# Patient Record
Sex: Female | Born: 1960
Health system: Southern US, Community
[De-identification: ages and names within clinical notes are randomized; demographics above are authoritative.]

## PROBLEM LIST (undated history)

## (undated) DIAGNOSIS — E785 Hyperlipidemia, unspecified: Secondary | ICD-10-CM

## (undated) DIAGNOSIS — E039 Hypothyroidism, unspecified: Secondary | ICD-10-CM

## (undated) DIAGNOSIS — Z9889 Other specified postprocedural states: Secondary | ICD-10-CM

## (undated) DIAGNOSIS — I10 Essential (primary) hypertension: Secondary | ICD-10-CM

## (undated) DIAGNOSIS — E559 Vitamin D deficiency, unspecified: Secondary | ICD-10-CM

## (undated) DIAGNOSIS — I739 Peripheral vascular disease, unspecified: Secondary | ICD-10-CM

## (undated) DIAGNOSIS — K8020 Calculus of gallbladder without cholecystitis without obstruction: Secondary | ICD-10-CM

## (undated) DIAGNOSIS — N183 Chronic kidney disease, stage 3 unspecified: Secondary | ICD-10-CM

## (undated) DIAGNOSIS — N281 Cyst of kidney, acquired: Secondary | ICD-10-CM

## (undated) DIAGNOSIS — Z720 Tobacco use: Secondary | ICD-10-CM

## (undated) DIAGNOSIS — Z85828 Personal history of other malignant neoplasm of skin: Secondary | ICD-10-CM

## (undated) DIAGNOSIS — I2510 Atherosclerotic heart disease of native coronary artery without angina pectoris: Secondary | ICD-10-CM

## (undated) DIAGNOSIS — I729 Aneurysm of unspecified site: Secondary | ICD-10-CM

## (undated) HISTORY — DX: Vitamin D deficiency, unspecified: E55.9

## (undated) HISTORY — DX: Other specified postprocedural states: Z98.890

## (undated) HISTORY — DX: Hypothyroidism, unspecified: E03.9

## (undated) HISTORY — DX: Peripheral vascular disease, unspecified: I73.9

## (undated) HISTORY — DX: Calculus of gallbladder without cholecystitis without obstruction: K80.20

## (undated) HISTORY — DX: Hyperlipidemia, unspecified: E78.5

## (undated) HISTORY — DX: Chronic kidney disease, stage 3 (moderate): N18.3

## (undated) HISTORY — DX: Chronic kidney disease, stage 3 unspecified: N18.30

## (undated) HISTORY — DX: Tobacco use: Z72.0

## (undated) HISTORY — DX: Cyst of kidney, acquired: N28.1

## (undated) HISTORY — DX: Essential (primary) hypertension: I10

## (undated) HISTORY — PX: CORONARY ARTERY BYPASS GRAFT: SHX141

## (undated) HISTORY — DX: Aneurysm of unspecified site: I72.9

## (undated) HISTORY — DX: Personal history of other malignant neoplasm of skin: Z85.828

## (undated) HISTORY — DX: Atherosclerotic heart disease of native coronary artery without angina pectoris: I25.10

---

## 1998-06-01 ENCOUNTER — Ambulatory Visit (HOSPITAL_COMMUNITY): Admission: RE | Admit: 1998-06-01 | Discharge: 1998-06-01 | Payer: Self-pay | Admitting: Cardiovascular Disease

## 1998-06-01 ENCOUNTER — Encounter: Payer: Self-pay | Admitting: Cardiovascular Disease

## 1998-06-03 ENCOUNTER — Observation Stay (HOSPITAL_COMMUNITY): Admission: AD | Admit: 1998-06-03 | Discharge: 1998-06-03 | Payer: Self-pay | Admitting: Cardiovascular Disease

## 1998-06-07 ENCOUNTER — Encounter: Payer: Self-pay | Admitting: Surgery

## 1998-06-08 ENCOUNTER — Encounter: Payer: Self-pay | Admitting: Cardiovascular Disease

## 1998-06-08 ENCOUNTER — Encounter: Payer: Self-pay | Admitting: Surgery

## 1998-06-08 ENCOUNTER — Inpatient Hospital Stay (HOSPITAL_COMMUNITY): Admission: RE | Admit: 1998-06-08 | Discharge: 1998-06-15 | Payer: Self-pay | Admitting: Surgery

## 1998-06-09 ENCOUNTER — Encounter: Payer: Self-pay | Admitting: Surgery

## 1998-06-10 ENCOUNTER — Encounter: Payer: Self-pay | Admitting: Surgery

## 1998-06-11 ENCOUNTER — Encounter: Payer: Self-pay | Admitting: Cardiology

## 1998-08-02 ENCOUNTER — Encounter (HOSPITAL_COMMUNITY): Admission: RE | Admit: 1998-08-02 | Discharge: 1998-10-31 | Payer: Self-pay | Admitting: Cardiovascular Disease

## 1998-11-01 ENCOUNTER — Encounter (HOSPITAL_COMMUNITY): Admission: RE | Admit: 1998-11-01 | Discharge: 1999-01-30 | Payer: Self-pay | Admitting: Cardiovascular Disease

## 2000-02-27 DIAGNOSIS — I2510 Atherosclerotic heart disease of native coronary artery without angina pectoris: Secondary | ICD-10-CM

## 2000-02-27 HISTORY — DX: Atherosclerotic heart disease of native coronary artery without angina pectoris: I25.10

## 2000-02-27 HISTORY — PX: CORONARY ARTERY BYPASS GRAFT: SHX141

## 2000-07-28 ENCOUNTER — Ambulatory Visit (HOSPITAL_COMMUNITY): Admission: RE | Admit: 2000-07-28 | Discharge: 2000-07-28 | Payer: Self-pay | Admitting: Oncology

## 2000-07-29 ENCOUNTER — Encounter: Payer: Self-pay | Admitting: Vascular Surgery

## 2000-07-31 ENCOUNTER — Ambulatory Visit (HOSPITAL_COMMUNITY): Admission: RE | Admit: 2000-07-31 | Discharge: 2000-07-31 | Payer: Self-pay | Admitting: Vascular Surgery

## 2001-07-08 ENCOUNTER — Encounter: Payer: Self-pay | Admitting: Internal Medicine

## 2001-07-08 ENCOUNTER — Encounter: Admission: RE | Admit: 2001-07-08 | Discharge: 2001-07-08 | Payer: Self-pay | Admitting: Internal Medicine

## 2003-02-27 HISTORY — PX: EMBOLIZATION: SHX1496

## 2003-02-27 HISTORY — PX: CEREBRAL ANEURYSM REPAIR: SHX164

## 2003-12-13 ENCOUNTER — Emergency Department (HOSPITAL_COMMUNITY): Admission: EM | Admit: 2003-12-13 | Discharge: 2003-12-13 | Payer: Self-pay | Admitting: Emergency Medicine

## 2004-11-07 ENCOUNTER — Emergency Department (HOSPITAL_COMMUNITY): Admission: EM | Admit: 2004-11-07 | Discharge: 2004-11-07 | Payer: Self-pay | Admitting: Emergency Medicine

## 2005-03-23 ENCOUNTER — Encounter: Admission: RE | Admit: 2005-03-23 | Discharge: 2005-03-23 | Payer: Self-pay | Admitting: Neurosurgery

## 2007-05-23 ENCOUNTER — Ambulatory Visit (HOSPITAL_COMMUNITY): Admission: RE | Admit: 2007-05-23 | Discharge: 2007-05-23 | Payer: Self-pay | Admitting: Internal Medicine

## 2007-06-04 ENCOUNTER — Encounter: Admission: RE | Admit: 2007-06-04 | Discharge: 2007-06-04 | Payer: Self-pay | Admitting: Internal Medicine

## 2007-07-11 ENCOUNTER — Encounter: Payer: Self-pay | Admitting: Neurology

## 2008-01-09 ENCOUNTER — Encounter: Admission: RE | Admit: 2008-01-09 | Discharge: 2008-01-09 | Payer: Self-pay | Admitting: Internal Medicine

## 2009-05-16 ENCOUNTER — Encounter: Payer: Self-pay | Admitting: Cardiovascular Disease

## 2009-06-29 ENCOUNTER — Encounter: Payer: Self-pay | Admitting: Cardiovascular Disease

## 2009-07-11 ENCOUNTER — Ambulatory Visit (HOSPITAL_COMMUNITY): Admission: RE | Admit: 2009-07-11 | Discharge: 2009-07-11 | Payer: Self-pay | Admitting: Internal Medicine

## 2009-07-11 ENCOUNTER — Encounter: Payer: Self-pay | Admitting: Cardiovascular Disease

## 2009-07-13 ENCOUNTER — Encounter: Payer: Self-pay | Admitting: Cardiovascular Disease

## 2009-07-21 ENCOUNTER — Encounter: Payer: Self-pay | Admitting: Cardiovascular Disease

## 2009-07-22 ENCOUNTER — Encounter: Payer: Self-pay | Admitting: Cardiovascular Disease

## 2009-07-27 ENCOUNTER — Encounter: Admission: RE | Admit: 2009-07-27 | Discharge: 2009-07-27 | Payer: Self-pay | Admitting: Neurosurgery

## 2009-08-16 DIAGNOSIS — N184 Chronic kidney disease, stage 4 (severe): Secondary | ICD-10-CM | POA: Insufficient documentation

## 2009-08-16 DIAGNOSIS — E039 Hypothyroidism, unspecified: Secondary | ICD-10-CM | POA: Insufficient documentation

## 2009-08-16 DIAGNOSIS — I10 Essential (primary) hypertension: Secondary | ICD-10-CM | POA: Insufficient documentation

## 2009-08-16 DIAGNOSIS — E782 Mixed hyperlipidemia: Secondary | ICD-10-CM | POA: Insufficient documentation

## 2009-08-16 DIAGNOSIS — I2510 Atherosclerotic heart disease of native coronary artery without angina pectoris: Secondary | ICD-10-CM | POA: Insufficient documentation

## 2009-08-16 DIAGNOSIS — I739 Peripheral vascular disease, unspecified: Secondary | ICD-10-CM | POA: Insufficient documentation

## 2009-08-16 DIAGNOSIS — Z8673 Personal history of transient ischemic attack (TIA), and cerebral infarction without residual deficits: Secondary | ICD-10-CM | POA: Insufficient documentation

## 2009-08-16 DIAGNOSIS — G40909 Epilepsy, unspecified, not intractable, without status epilepticus: Secondary | ICD-10-CM | POA: Insufficient documentation

## 2009-08-17 ENCOUNTER — Telehealth (INDEPENDENT_AMBULATORY_CARE_PROVIDER_SITE_OTHER): Payer: Self-pay

## 2009-08-17 ENCOUNTER — Ambulatory Visit: Payer: Self-pay | Admitting: Cardiovascular Disease

## 2009-08-17 DIAGNOSIS — F17200 Nicotine dependence, unspecified, uncomplicated: Secondary | ICD-10-CM | POA: Insufficient documentation

## 2009-08-18 ENCOUNTER — Encounter: Payer: Self-pay | Admitting: Cardiology

## 2009-08-18 ENCOUNTER — Ambulatory Visit: Payer: Self-pay

## 2009-08-18 ENCOUNTER — Ambulatory Visit: Payer: Self-pay | Admitting: Cardiology

## 2009-08-18 ENCOUNTER — Encounter (HOSPITAL_COMMUNITY): Admission: RE | Admit: 2009-08-18 | Discharge: 2009-11-02 | Payer: Self-pay | Admitting: Cardiovascular Disease

## 2009-08-22 ENCOUNTER — Encounter: Payer: Self-pay | Admitting: Cardiology

## 2009-08-22 ENCOUNTER — Telehealth: Payer: Self-pay | Admitting: Cardiovascular Disease

## 2009-08-23 ENCOUNTER — Encounter: Payer: Self-pay | Admitting: Cardiovascular Disease

## 2009-08-30 ENCOUNTER — Encounter: Payer: Self-pay | Admitting: Cardiovascular Disease

## 2009-08-31 ENCOUNTER — Ambulatory Visit: Payer: Self-pay | Admitting: Cardiovascular Disease

## 2009-08-31 LAB — CONVERTED CEMR LAB
BUN: 23 mg/dL (ref 6–23)
Basophils Absolute: 0 10*3/uL (ref 0.0–0.1)
Basophils Relative: 0.6 % (ref 0.0–3.0)
CO2: 31 meq/L (ref 19–32)
Calcium: 9.3 mg/dL (ref 8.4–10.5)
Chloride: 102 meq/L (ref 96–112)
Creatinine, Ser: 1.2 mg/dL (ref 0.4–1.2)
Eosinophils Absolute: 0.4 10*3/uL (ref 0.0–0.7)
Eosinophils Relative: 5.9 % — ABNORMAL HIGH (ref 0.0–5.0)
GFR calc non Af Amer: 48.43 mL/min (ref >60)
Glucose, Bld: 89 mg/dL (ref 70–99)
HCT: 39.2 % (ref 36.0–46.0)
Hemoglobin: 13.3 g/dL (ref 12.0–15.0)
INR: 0.9 (ref 0.8–1.0)
Lymphocytes Relative: 27.4 % (ref 12.0–46.0)
Lymphs Abs: 2 10*3/uL (ref 0.7–4.0)
MCHC: 33.9 g/dL (ref 30.0–36.0)
MCV: 95 fL (ref 78.0–100.0)
Monocytes Absolute: 0.5 10*3/uL (ref 0.1–1.0)
Monocytes Relative: 7 % (ref 3.0–12.0)
Neutro Abs: 4.4 10*3/uL (ref 1.4–7.7)
Neutrophils Relative %: 59.1 % (ref 43.0–77.0)
Platelets: 161 10*3/uL (ref 150.0–400.0)
Potassium: 3.5 meq/L (ref 3.5–5.1)
Prothrombin Time: 9.9 s (ref 9.7–11.8)
RBC: 4.12 M/uL (ref 3.87–5.11)
RDW: 13.9 % (ref 11.5–14.6)
Sodium: 142 meq/L (ref 135–145)
WBC: 7.5 10*3/uL (ref 4.5–10.5)

## 2009-09-01 ENCOUNTER — Ambulatory Visit: Payer: Self-pay | Admitting: Cardiovascular Disease

## 2009-09-01 ENCOUNTER — Inpatient Hospital Stay (HOSPITAL_BASED_OUTPATIENT_CLINIC_OR_DEPARTMENT_OTHER): Admission: RE | Admit: 2009-09-01 | Discharge: 2009-09-01 | Payer: Self-pay | Admitting: Cardiovascular Disease

## 2009-09-02 ENCOUNTER — Telehealth: Payer: Self-pay | Admitting: Cardiovascular Disease

## 2009-09-05 ENCOUNTER — Encounter: Payer: Self-pay | Admitting: Cardiovascular Disease

## 2009-09-05 ENCOUNTER — Ambulatory Visit: Payer: Self-pay | Admitting: Surgery

## 2009-09-20 ENCOUNTER — Ambulatory Visit: Payer: Self-pay | Admitting: Surgery

## 2009-09-20 ENCOUNTER — Encounter: Payer: Self-pay | Admitting: Cardiovascular Disease

## 2009-09-20 ENCOUNTER — Ambulatory Visit (HOSPITAL_COMMUNITY): Admission: RE | Admit: 2009-09-20 | Discharge: 2009-09-20 | Payer: Self-pay | Admitting: Surgery

## 2010-02-26 HISTORY — PX: CEREBRAL ANEURYSM REPAIR: SHX164

## 2010-03-19 ENCOUNTER — Encounter: Payer: Self-pay | Admitting: Neurosurgery

## 2010-03-20 ENCOUNTER — Encounter: Payer: Self-pay | Admitting: Interventional Radiology

## 2010-03-30 NOTE — Progress Notes (Signed)
Summary: [facility]   Imported By: Ranell Patrick 08/16/2009 10:07:33  _____________________________________________________________________  External Attachment:    Type:   Image     Comment:   External Document

## 2010-03-30 NOTE — Assessment & Plan Note (Signed)
Summary: surgical clearance/neuro surgery/jml   Primary Provider:  Dr. Wayland Denis  CC:  sugerical clearence back surgery.  History of Present Illness: Olivia Walsh is seen today at the request of Dr Annette Stable for surgical clearance.  She is scheduled for lumbar surgery next Thursday.  She had CABG by Dr Cyndia Bent in 2000.  She does not remember her cardiologist and has not been followed since then.  Her CABG report is not in [place].  She continues to smoke.  She is developing COPD clinically and I counseled her for less than 10 minutes.  Her activity is limited by back pain.  She denies SSCP but has exertional dyspnea.  She is compliant with her meds and will need to stop her Plavix for the surgery.  She has had previous cerebral artery coiling in 2005 and the Plavix is more for this than CAD.  She has no history of carotid disease, AAA or PVD.  She has not had previous complicatoins with surgery and has no bleeding diathesis.  I asked her to call Dr Gearldine Bienenstock office to see when they wanted her to hold her Plavix  Current Problems (verified): 1)  Renal Insufficiency  (ICD-588.9) 2)  Pvd  (ICD-443.9) 3)  Cad  (ICD-414.00) 4)  Cva  (ICD-434.91) 5)  Hypertension  (ICD-401.9) 6)  Hyperlipidemia  (ICD-272.4) 7)  Seizures, Hx of  (ICD-V12.49) 8)  Hypothyroidism  (ICD-244.9)  Current Medications (verified): 1)  Plavix 75 Mg Tabs (Clopidogrel Bisulfate) .... Take One Tablet By Mouth Daily 2)  Furosemide 40 Mg Tabs (Furosemide) .... Take One Tablet By Mouth Daily. 3)  Labetalol Hcl 300 Mg Tabs (Labetalol Hcl) .... 1/2 Tab By Mouth Two Times A Day 4)  Levothroid 75 Mcg Tabs (Levothyroxine Sodium) .Marland Kitchen.. 1 Tab By Mouth Once Daily 5)  Levetiracetam 500 Mg Tabs (Levetiracetam) .Marland Kitchen.. 1 Tab By Mouth Two Times A Day 6)  Crestor 20 Mg Tabs (Rosuvastatin Calcium) .... Take One Tablet By Mouth Daily. 7)  Vitamin B-12 (Cyanocobalamin) .... Daily 8)  Vitamin D (Ergocalciferol) 50000 Unit Caps (Ergocalciferol) .Marland Kitchen.. 1 Tab As  Directed 9)  Potassium Gluconate 595 Mg Tabs (Potassium Gluconate) .Marland Kitchen.. 1 Tab By Mouth Once Daily 10)  Magnesium 250 Mg Tabs (Magnesium) .Marland Kitchen.. 1 Tab By Mouth Once Daily 11)  Hydrocodone-Acetaminophen 2.5-500 Mg Tabs (Hydrocodone-Acetaminophen) .... As Needed 12)  Flexeril 10 Mg Tabs (Cyclobenzaprine Hcl) .... As Needed  Allergies (verified): 1)  ! * Pain Medication  Past History:  Past Medical History: Last updated: 08/16/2009 She had a right  CVA in 2000 by MRI. PVD  with  claudication symptoms CAD: CABG 2000 HYPERTENSION HYPERLIPIDEMIA  renal insufficiency SEIZURES, HX OF  HYPOTHYROIDISM  Right leg claudication. The patient has no known drug allergies, although she is  intolerant to pain medication and requires very low doses by her report.      Past Surgical History: Last updated: 08/16/2009  status post coronary artery bypass graft  surgery in 2000  She does not remember who her surgeon was and does not  currently have a cardiologist.  She reports being very sensitive to anesthesia.      Family History: Last updated: 08/16/2009   The patient has a very strong history of vascular   disease.  Her mother died at age 60 from heart trouble.  Her father died   from cancer of the brain.  There is no known family history of cerebral   aneurysms.   Social History: Last updated: 08/16/2009  The patient is married.  She and her husband living in   [place].  They do not have any children.  She smokes 1-1/2 to 1 pack   of cigarettes per day and has done so since age 24.  She does not use   alcohol.  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   Review of Systems       Denies fever, malais, weight loss, blurry vision, decreased visual acuity, cough, sputum, SOB, hemoptysis, pleuritic pain, palpitaitons, heartburn, abdominal pain, melena, lower extremity edema, claudication, or rash.   Vital Signs:  Patient profile:   50 year old female Height:      67  inches Weight:      188 pounds BMI:     29.55 Pulse rate:   67 / minute Resp:     12 per minute BP sitting:   116 / 75  (right arm)  Vitals Entered By: Burnett Kanaris (August 17, 2009 10:16 AM)  Physical Exam  General:  Affect appropriate Healthy:  appears stated age 64: normal Neck supple with no adenopathy JVP normal no bruits no thyromegaly Lungs clear with no wheezing and good diaphragmatic motion Heart:  S1/S2 no murmur,rub, gallop or click PMI normal Abdomen: benighn, BS positve, no tenderness, no AAA no bruit.  No HSM or HJR Distal pulses intact with no bruits No edema Neuro non-focal Skin warm and dry    Impression & Recommendations:  Problem # 1:  CAD (ICD-414.34) CABG 50 year old grafts with ongoing smoking.  Lexiscan myovue to clear for surgery Her updated medication list for this problem includes:    Plavix 75 Mg Tabs (Clopidogrel bisulfate) .Marland Kitchen... Take one tablet by mouth daily    Labetalol Hcl 300 Mg Tabs (Labetalol hcl) .Marland Kitchen... 1/2 tab by mouth two times a day  Orders: EKG w/ Interpretation (93000) Nuclear Stress Test (Nuc Stress Test)  Problem # 2:  CVA (ICD-434.91) Hold Plavix at least 5 days before lumbar surgery Her updated medication list for this problem includes:    Plavix 75 Mg Tabs (Clopidogrel bisulfate) .Marland Kitchen... Take one tablet by mouth daily  Problem # 3:  HYPERTENSION (ICD-401.9) Well cotnrolled Her updated medication list for this problem includes:    Furosemide 40 Mg Tabs (Furosemide) .Marland Kitchen... Take one tablet by mouth daily.    Labetalol Hcl 300 Mg Tabs (Labetalol hcl) .Marland Kitchen... 1/2 tab by mouth two times a day  Problem # 4:  HYPERLIPIDEMIA (ICD-272.4) Continue statin labs per primary Her updated medication list for this problem includes:    Crestor 20 Mg Tabs (Rosuvastatin calcium) .Marland Kitchen... Take one tablet by mouth daily.  Problem # 5:  SMOKER (ICD-305.1) Counseled for less than 10 minutes.  Encouraged her to get PFT's with primary.   Indicated clinical COPD and risk to worsening vascular disease with ongoing abuse.  F/ U primary consdier Chantix post lumbar surgery  Patient Instructions: 1)  Your physician recommends that you schedule a follow-up appointment in: [place] 2)  Your physician recommends that you continue on your current medications as directed. Please refer to the Current Medication list given to you today. 3)  Your physician has requested that you have an [place].  For further information please visit [email or web address].  Please follow instruction sheet, as given.   EKG Report  Procedure date:  08/17/2009  Findings:      NSR 67 Minor lateral T wave changes No recent ECG to compare

## 2010-03-30 NOTE — Op Note (Signed)
Summary: [facility]  [place]   Imported By: Marilynne Drivers 10/06/2009 12:49:54  _____________________________________________________________________  External Attachment:    Type:   Image     Comment:   External Document

## 2010-03-30 NOTE — Miscellaneous (Signed)
Summary: Orders Update  Clinical Lists Changes  Orders: Added new Test order of TLB-BMP (Basic Metabolic Panel-BMET) (80048-METABOL) - Signed Added new Test order of TLB-CBC Platelet - w/Differential (85025-CBCD) - Signed Added new Test order of TLB-PT (Protime) (85610-PTP) - Signed 

## 2010-03-30 NOTE — Letter (Signed)
Summary: [facility] Visit Note  [facility] Office Visit Note   Imported By: Sallee Provencal 08/22/2009 08:46:06  _____________________________________________________________________  External Attachment:    Type:   Image     Comment:   External Document

## 2010-03-30 NOTE — Letter (Signed)
Summary: [facility] Office Visit Note  [facility] Office Visit Note   Imported By: Sallee Provencal 08/22/2009 08:47:16  _____________________________________________________________________  External Attachment:    Type:   Image     Comment:   External Document

## 2010-03-30 NOTE — Letter (Signed)
Summary: [facility] Office Visit Note  [facility] Office Visit Note   Imported By: Sallee Provencal 08/22/2009 08:46:29  _____________________________________________________________________  External Attachment:    Type:   Image     Comment:   External Document

## 2010-03-30 NOTE — Progress Notes (Signed)
Summary: [facility]  [facility]   Imported By: Ranell Patrick 08/16/2009 10:03:02  _____________________________________________________________________  External Attachment:    Type:   Image     Comment:   External Document

## 2010-03-30 NOTE — Progress Notes (Signed)
Summary: status of meds that suppose to be called in / cath on yesterday  Phone Note Call from Patient Call back at Home Phone [phone]   Caller: Patient Reason for Call: Talk to Nurse Summary of Call: per pt calling had cath done yesterday was told by pn that meds would be called in regarding blood pressure. [employer] claims handler on [address]  Initial call taken by: Neil Crouch,  September 02, 2009 10:03 AM  Follow-up for Phone Call        per dr Johnsie Cancel, pt to start lisinopril 5mg  once daily for blood pressure, script sent to pharm, pt aware Fredia Beets, RN  September 02, 2009 1:57 PM     New/Updated Medications: LISINOPRIL 5 MG TABS (LISINOPRIL) Take one tablet by mouth daily Prescriptions: LISINOPRIL 5 MG TABS (LISINOPRIL) Take one tablet by mouth daily  #30 x 12   Entered by:   Fredia Beets, RN   Authorized by:   Bosie Clos, MD, [facility]   Signed by:   Fredia Beets, RN on 09/02/2009   Method used:   Electronically to        [facility] (retail)       [address]       [place], [place]  [address]       Ph: [phone] or [phone]       Fax: [phone]   RxID:   [id number]

## 2010-03-30 NOTE — Progress Notes (Signed)
Summary: [facility]  [facility]   Imported By: Ranell Patrick 08/16/2009 10:05:58  _____________________________________________________________________  External Attachment:    Type:   Image     Comment:   External Document

## 2010-03-30 NOTE — Op Note (Signed)
[facility]   Imported By: Ranell Patrick 08/25/2009 09:31:41  _____________________________________________________________________  External Attachment:    Type:   Image     Comment:   External Document

## 2010-03-30 NOTE — Assessment & Plan Note (Signed)
Summary: Cardiology Nuclear Study  Nuclear Med Background Indications for Stress Test: Evaluation for Ischemia, Surgical Clearance, Graft Patency  Indications Comments: Pending Back Surgery by Dr. Earnie Larsson on 08/25/09.  History: CABG, Heart Catheterization, Myocardial Perfusion Study  History Comments: '00 MPI: no report in E-chart. '00 CABG x5. '05 Brain aneurysm with coiling.  Hx. (L) carotid artery aneurysm.  Symptoms: DOE    Nuclear Pre-Procedure Cardiac Risk Factors: Claudication, CVA, Family History - CAD, Hypertension, Lipids, PVD, Smoker Caffeine/Decaff Intake: None NPO After: 9:00 PM Lungs: Clear IV 0.9% NS with Angio Cath: 22g     IV Site: (L) Upper Forearm IV Started by: Irven Baltimore RN Chest Size (in) 36     Cup Size B     Height (in): 67 Weight (lb): 184 BMI: 28.92 Tech Comments: Held labetalol and lasix this am.  Nuclear Med Study 1 or 2 day study:  1 day     Stress Test Type:  Lexiscan [place] MD:  Loralie Champagne, MD     Referring MD:  Jenkins Rouge Resting Radionuclide:  Technetium 51m Tetrofosmin     Resting Radionuclide Dose:  10.6 mCi  Stress Radionuclide:  Technetium 28m Tetrofosmin     Stress Radionuclide Dose:  32 mCi   Stress Protocol      Max HR:  80 bpm Max Systolic BP: 0000000 mm HgRate Pressure Product:  12800  Lexiscan: 0.4 mg   Stress Test Technologist:  Irven Baltimore RN     Nuclear Technologist:  Charlton Amor CNMT  Rest Procedure  Myocardial perfusion imaging was performed at rest 45 minutes following the intravenous administration of Myoview Technetium 33m Tetrofosmin.  Stress Procedure  The patient received IV Lexiscan 0.4 mg over 15-seconds.  Myoview injected at 30-seconds.  There were baseline nonspecific ST-T changes, but no significant changes with Lexiscan.  Quantitative spect images were obtained after a 45 minute delay.  QPS Raw Data Images:  Mild breast attenuation.  Normal left ventricular size. Stress Images:  Mild anterior  perfusion defect.  Moderate basal inferolateral perfusion defect.  Rest Images:  Mild anterior perfusion defect.  Subtraction (SDS):  Mild fixed anterior perfusion defect.  Moderate reversible basal inferolateral perfusion defect.  Transient Ischemic Dilatation:  1.15  (Normal <1.22)  Lung/Heart Ratio:  .29  (Normal <0.45)  Quantitative Gated Spect Images QGS EDV:  92 ml QGS ESV:  40 ml QGS EF:  57 % QGS cine images:  Basal inferolateral hypokinesis.    Overall Impression  Exercise Capacity: Lexiscan study BP Response: Normal blood pressure response. Clinical Symptoms: Flushed ECG Impression: No significant ST segment change suggestive of ischemia. Overall Impression: Mild fixed anterior defect that I suspect is breast attenuation.  There is a reversible basal inferolateral perfusion defect accompanied by corresponding hypokinesis.  This is suggestive of ischemia  Appended Document: Cardiology Nuclear Study set up for cath Tues/Wendsday before back surgery.  Old CABG and still smoking with ischemia on myovue  Appended Document: Cardiology Nuclear Study spoke with pt, she is scheduled for a cath in the [facility] lab on thursday 09-01-09. she will have labs done in our office 08-31-09. instructions over the phone. Fredia Beets, RN  August 30, 2009 11:52 AM    Clinical Lists Changes  Orders: Added new Referral order of Cardiac Catheterization (Cardiac Cath) - Signed

## 2010-03-30 NOTE — Progress Notes (Signed)
Summary: Nuc. Pre-Procedure  Phone Note Outgoing Call   Call placed by: Irven Baltimore, RN,  August 17, 2009 11:03 AM Summary of Call: Reviewed information on Myoview Information Sheet (see scanned document for further details).  Spoke with the patient after office visit with Dr. Edmonia James today. Cela Newcom,RN.     Nuclear Med Background Indications for Stress Test: Evaluation for Ischemia, Surgical Clearance, Graft Patency  Indications Comments: Pending Back Surgery by Dr. Earnie Larsson.  History: CABG  History Comments: 2005 Hx. Brain aneurysm with coiling.  Hx. (L) carotid artery aneurysm.  Symptoms: DOE    Nuclear Pre-Procedure Cardiac Risk Factors: Claudication, CVA, Family History - CAD, Hypertension, Lipids, PVD, Smoker Height (in): 50

## 2010-03-30 NOTE — Letter (Signed)
Summary: Generic Letter  Press photographer, [place]  [address] [place]   [place], [place] [address]   Phone: [phone]  Fax: [phone]    08/23/2009  Esbeidy Eyerman [id number] [place], [place]  [address]  DR POOL, ABOVE NAMED PT IS AT LOW RISK FOR CARDIAC EVENT PER DR BENSIMHON.MAY PROCEED WITH SURGERY.         Sincerely,  DR DAN BENSIMHON/ Devra Dopp, LPN

## 2010-03-30 NOTE — Progress Notes (Signed)
Summary: [facility]  [facility]   Imported By: Ranell Patrick 08/16/2009 10:03:56  _____________________________________________________________________  External Attachment:    Type:   Image     Comment:   External Document

## 2010-03-30 NOTE — Letter (Signed)
Summary: Operative Report   Operative Report   Imported By: Sallee Provencal 08/25/2009 15:50:13  _____________________________________________________________________  External Attachment:    Type:   Image     Comment:   External Document

## 2010-03-30 NOTE — Letter (Signed)
Summary: [facility] Office Note  [facility] Office Note   Imported By: Sallee Provencal 08/22/2009 08:47:46  _____________________________________________________________________  External Attachment:    Type:   Image     Comment:   External Document

## 2010-03-30 NOTE — Progress Notes (Signed)
Summary: pt needs surgical clearence  Phone Note Call from Patient Call back at Work Phone [phone]   Caller: Patient Reason for Call: Talk to Nurse, Talk to Doctor, Lab or Test Results Summary of Call: pt had myoview and needs results for surgical clearance needs a call back today Initial call taken by: Shelda Pal,  August 22, 2009 11:28 AM  Follow-up for Phone Call        pt calling back re earlier msg-pls call [phone]  Follow-up by: Lorenda Hatchet,  August 22, 2009 2:11 PM  Additional Follow-up for Phone Call Additional follow up Details #1::        PT CALLED  NEEDING RESULTS OF STRESS TEST NEEDS TO SCHEDULE SURGERY NOT SCHEDULED AT THIS TIME PENDING STRESS RESULTS PT AWARE DR Whiteface UPSET  IN PAIN NEEDS TO SET UP SX.SPOKE WITH DOD(DR BENSIMOHN) STATED  PT AT [facility] [facility] TO [place]. OF ABOVE . Additional Follow-up by: Devra Dopp, LPN,  June 28, [id number] [id number] PM    Additional Follow-up for Phone Call Additional follow up Details #2::    returning call, Darnell Level  August 23, 2009 1:24 PM  PT AWARE. LETTER FAXED TO DR Mallie Mussel POOL. Follow-up by: Devra Dopp, LPN,  June 28, 624THL [id number] PM   Appended Document: pt needs surgical clearence Altha Harm,  I am in [facility] Tuesday and Wendsday.  Please review clearance with Linna Hoff as Evonnie Dawes was "positive"  Appended Document: pt needs surgical clearence recieved phone call from dr Johnsie Cancel, pt myoview positive and pt needs cath prior to surgery. spoke with vanessa in dr pool's office. pt is scheduled for surgery 09-08-09. pt will have cath this week and then clearence will be decided. pt aware.

## 2010-03-30 NOTE — Progress Notes (Signed)
Summary: [facility]  [facility]   Imported By: Ranell Patrick 08/16/2009 10:05:09  _____________________________________________________________________  External Attachment:    Type:   Image     Comment:   External Document

## 2010-03-30 NOTE — Cardiovascular Report (Signed)
Summary: Pre Cath Orders   Pre Cath Orders   Imported By: Sallee Provencal 09/06/2009 15:39:11  _____________________________________________________________________  External Attachment:    Type:   Image     Comment:   External Document

## 2010-03-30 NOTE — Letter (Signed)
Summary: [facility]cent Office Visit Note  [facility] Office Visit Note   Imported By: Sallee Provencal 08/22/2009 08:46:54  _____________________________________________________________________  External Attachment:    Type:   Image     Comment:   External Document

## 2010-03-30 NOTE — Letter (Signed)
Summary: [facility]   [facility]   Imported By: Sallee Provencal 10/03/2009 12:29:35  _____________________________________________________________________  External Attachment:    Type:   Image     Comment:   External Document

## 2010-05-13 LAB — POCT I-STAT 4, (NA,K, GLUC, HGB,HCT)
Glucose, Bld: 89 mg/dL (ref 70–99)
HCT: 37 % (ref 36.0–46.0)
Hemoglobin: 12.6 g/dL (ref 12.0–15.0)
Potassium: 3.4 mEq/L — ABNORMAL LOW (ref 3.5–5.1)
Sodium: 139 mEq/L (ref 135–145)

## 2010-05-13 LAB — POCT I-STAT, CHEM 8
BUN: 22 mg/dL (ref 6–23)
Calcium, Ion: 1.12 mmol/L (ref 1.12–1.32)
Chloride: 102 mEq/L (ref 96–112)
Creatinine, Ser: 1.4 mg/dL — ABNORMAL HIGH (ref 0.4–1.2)
Glucose, Bld: 88 mg/dL (ref 70–99)
HCT: 40 % (ref 36.0–46.0)
Hemoglobin: 13.6 g/dL (ref 12.0–15.0)
Potassium: 3.5 mEq/L (ref 3.5–5.1)
Sodium: 140 mEq/L (ref 135–145)
TCO2: 33 mmol/L (ref 0–100)

## 2010-06-12 ENCOUNTER — Emergency Department (HOSPITAL_COMMUNITY): Payer: BC Managed Care – HMO

## 2010-06-12 ENCOUNTER — Emergency Department (HOSPITAL_COMMUNITY)
Admission: EM | Admit: 2010-06-12 | Discharge: 2010-06-12 | Disposition: A | Discharge status: Nursing Home (Includes ICF) | Payer: BC Managed Care – HMO | Attending: Emergency Medicine | Admitting: Emergency Medicine

## 2010-06-12 DIAGNOSIS — I629 Nontraumatic intracranial hemorrhage, unspecified: Secondary | ICD-10-CM | POA: Insufficient documentation

## 2010-06-12 DIAGNOSIS — G9389 Other specified disorders of brain: Secondary | ICD-10-CM | POA: Insufficient documentation

## 2010-06-12 DIAGNOSIS — R51 Headache: Secondary | ICD-10-CM | POA: Insufficient documentation

## 2010-06-12 DIAGNOSIS — I10 Essential (primary) hypertension: Secondary | ICD-10-CM | POA: Insufficient documentation

## 2010-06-12 DIAGNOSIS — I2510 Atherosclerotic heart disease of native coronary artery without angina pectoris: Secondary | ICD-10-CM | POA: Insufficient documentation

## 2010-06-12 DIAGNOSIS — I671 Cerebral aneurysm, nonruptured: Secondary | ICD-10-CM | POA: Insufficient documentation

## 2010-06-12 DIAGNOSIS — Z951 Presence of aortocoronary bypass graft: Secondary | ICD-10-CM | POA: Insufficient documentation

## 2010-06-12 DIAGNOSIS — I252 Old myocardial infarction: Secondary | ICD-10-CM | POA: Insufficient documentation

## 2010-06-12 LAB — DIFFERENTIAL
Basophils Absolute: 0.1 10*3/uL (ref 0.0–0.1)
Basophils Relative: 1 % (ref 0–1)
Eosinophils Absolute: 0.2 10*3/uL (ref 0.0–0.7)
Eosinophils Relative: 2 % (ref 0–5)
Lymphocytes Relative: 16 % (ref 12–46)
Lymphs Abs: 1.6 10*3/uL (ref 0.7–4.0)
Monocytes Absolute: 0.9 10*3/uL (ref 0.1–1.0)
Monocytes Relative: 9 % (ref 3–12)
Neutro Abs: 7.6 10*3/uL (ref 1.7–7.7)
Neutrophils Relative %: 73 % (ref 43–77)

## 2010-06-12 LAB — CBC
HCT: 43.7 % (ref 36.0–46.0)
Hemoglobin: 14.7 g/dL (ref 12.0–15.0)
MCH: 30.8 pg (ref 26.0–34.0)
MCHC: 33.6 g/dL (ref 30.0–36.0)
MCV: 91.6 fL (ref 78.0–100.0)
Platelets: 178 10*3/uL (ref 150–400)
RBC: 4.77 MIL/uL (ref 3.87–5.11)
RDW: 13.7 % (ref 11.5–15.5)
WBC: 10.4 10*3/uL (ref 4.0–10.5)

## 2010-06-12 LAB — BASIC METABOLIC PANEL
BUN: 20 mg/dL (ref 6–23)
CO2: 32 mEq/L (ref 19–32)
Calcium: 9.9 mg/dL (ref 8.4–10.5)
Chloride: 96 mEq/L (ref 96–112)
Creatinine, Ser: 1.28 mg/dL — ABNORMAL HIGH (ref 0.4–1.2)
GFR calc Af Amer: 53 mL/min — ABNORMAL LOW (ref >60)
GFR calc non Af Amer: 44 mL/min — ABNORMAL LOW (ref >60)
Glucose, Bld: 140 mg/dL — ABNORMAL HIGH (ref 70–99)
Potassium: 3.5 mEq/L (ref 3.5–5.1)
Sodium: 139 mEq/L (ref 135–145)

## 2010-06-12 LAB — PROTIME-INR
INR: 0.87 (ref 0.00–1.49)
Prothrombin Time: 12 seconds (ref 11.6–15.2)

## 2010-06-12 LAB — APTT: aPTT: 32 seconds (ref 24–37)

## 2010-07-11 NOTE — Consult Note (Signed)
Olivia Walsh, Olivia Walsh               ACCOUNT NO.:  [id number]   MEDICAL RECORD NO.:  [id number]          PATIENT TYPE:  OUT   LOCATION:  XRAY                         FACILITY:  [facility]   PHYSICIAN:  Sanjeev K. Deveshwar, M.D.DATE OF BIRTH:  March 20, 1960   DATE OF CONSULTATION:  07/11/2007  DATE OF DISCHARGE:                                 CONSULTATION   CHIEF COMPLAINT:  Cerebral aneurysms.   HISTORY OF PRESENT ILLNESS:  This is a pleasant 50 year old female  referred to Dr. Estanislado Pandy through the courtesy of Dr. Floyde Parkins.  The  patient has a history of cerebral aneurysms in 1996 or 1997.  The  patient was evaluated for diplopia.  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  She apparently underwent balloon test  occlusion at [facility] and suffered loss of consciousness.  The  procedure was terminated and the aneurysm was not coiled.   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  This was coiled at  [facility] and the patient actually did very well.  In June 2006,  she returned to [facility] for further evaluation.  She had a  repeat cerebral angiogram at that time.  She was noted to have a right  vertebral artery PICA aneurysm and attempt was made at stent placement  for this wide based aneurysm.  However, the stent was unable to be  deployed.  She did however undergo satisfactory coiling of anterior  communicating artery aneurysm without complications.  The patient  apparently has not had any further studies since that time.  She  recently requested that Dr. Melford Aase refer her to a local neurologist,  and the patient was seen by Dr. Floyde Parkins on June 02, 2007.  The  patient is subsequently been referred to Dr. Estanislado Pandy to discuss  further evaluation and treatment options.   The patient reports that  she has done very well.  She does have some  short-term memory problems felt secondary to her ruptured aneurysm as  well as occasional word-finding problems.  She presents today  accompanied by her husband to be seen in consultation.   PAST MEDICAL HISTORY:  Significant for hyperlipidemia.  She had a right  CVA in 2000 by MRI.  She has a history of hypertension.  She has  coronary artery disease and is status post coronary artery bypass graft  surgery in 2000.  She does not remember who her surgeon was and does not  currently have a cardiologist.  She has peripheral vascular disease with  claudication symptoms, hypothyroidism, ongoing tobacco use, the above-  noted aneurysms, a history of seizures, her last seizure was 1-1/2 years  ago.  She also reports that she has a solitary kidney.  She believes  this is due to her vascular problem.   PAST SURGICAL HISTORY:  As noted the patient had coronary artery bypass  graft surgery in 2000.  She reports being very  sensitive to anesthesia.   ALLERGIES:  The patient has no known drug allergies, although she is  intolerant to pain medication and requires very low doses by her report.   CURRENT MEDICATIONS:  Include Keppra 250 mg b.i.d., pravastatin, Plavix,  aspirin 81 mg daily, Diovan, labetalol, and levothyroxine.   SOCIAL HISTORY:  The patient is married.  She and her husband living in  [place].  They do not have any children.  She smokes 1-1/2 to 1 pack  of cigarettes per day and has done so since age 67.  She does not use  alcohol.  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   FAMILY HISTORY:  The patient has a very strong history of vascular  disease.  Her mother died at age 45 from heart trouble.  Her father died  from cancer of the brain.  There is no known family history of cerebral  aneurysms.   IMPRESSION AND PLAN:  The patient presents today accompanied by her  husband to discuss further evaluation and treatment  of her cerebral  aneurysms.  She brought with her the results of her cerebral angiogram  from 2006.  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  She has two aneurysms that were coiled, one after it had  ruptured as well as a fourth aneurysm that is yet to be addressed.  The  patient was also noted to have poor flow in the right middle cerebral  artery with a previous right CVA on the angiogram.  She has an absent or  nonvisualized left vertebral artery as well.  The patient has peripheral  vascular disease with claudication symptoms and history of coronary  artery disease with previous coronary artery bypass graft surgery.   Cerebral aneurysms were discussed in detail along with treatment options  including risks and benefits of each option.  Dr. Estanislado Pandy pointed out  the 4 known aneurysms.  He felt that the patient would most definitely  need further treatment.  He recommended a repeat cerebral angiogram at  this time with further treatment options to be discussed following the  angiogram.  The patient was encouraged to try to quit smoking.  All of  the patient's and her husband's questions were answered.  Greater than  60 minutes was spent on this consult.      Mikey Bussing, P.A.    ______________________________  Fritz Pickerel. Estanislado Pandy, M.D.    DR/MEDQ  D:  07/11/2007  T:  07/12/2007  Job:  [id number]   cc:   [name], M.D.

## 2010-07-11 NOTE — Assessment & Plan Note (Signed)
OFFICE VISIT   SCHARTNER, Jameia J  DOB:  September 26, 1960                                       09/05/2009  [id number]   REASON FOR VISIT:  Leg pain.   HISTORY:  This is a 50 year old female seen at the request Dr. Johnsie Cancel  for bilateral leg pain.  The patient has a history of having undergone  coronary artery bypass graft in 2000 after a heart attack. About 8 weeks  ago, she began having complaints of left leg pain.  She was seen by Dr.  Trenton Gammon and found to have L4-L5 disk disease.  Dr. Trenton Gammon sent her to Dr.  Johnsie Cancel for cardiac clearance.  During the catheterization extreme  difficulty was encountered getting access across the iliac vessels.  I  have been asked to evaluate her lower extremity circulation to see if  this is a contributing factor to her problems.  She does still suffer  from a left foot drop and peroneal nerve palsy.  She complains of pain  that begins in her back and shoots down the outside of her leg.  It is  sharp in nature.  She does complain of cramps with walking.  She is not  having rest pain or tissue loss.   The patient suffers from hypertension and has been on medication since  age 76.  She also is treated medically for hypercholesterolemia.  She  has a solitary kidney and has a history of ruptured brain aneurysm.  Her  only residual defect is a seizure disorder, for which she takes  antiseizure medicine and has not had a seizure in 2 years.   REVIEW OF SYSTEMS:  GENERAL:  Negative for fevers, chills, weight gain  or weight loss.  VASCULAR:  Positive for pain in legs with walking.  CARDIAC:  Negative.  GI:  Negative.  NEURO:  Negative.  PULMONARY:  Negative.  GU:  Positive for kidney disease in that she only has one functioning  kidney.  ENT:  Negative.  MUSCULOSKELETAL:  Negative.  PSYCHIATRIC:  Negative.  SKIN:  Negative.   PAST MEDICAL HISTORY:  Hypertension, hypercholesterolemia, history of  MI, coronary artery disease,  seizure disorder, solitary kidney.   PAST SURGICAL HISTORY:  1. Brain aneurysm repair (rupture).  2. Coronary artery bypass graft.   SOCIAL HISTORY:  She is married.  Currently smokes a pack of cigarettes  a day.  Does not drink alcohol.   FAMILY HISTORY:  Mother and brother both died of heart attacks at age  38.   PHYSICAL EXAMINATION:  Heart rate 73, blood pressure 134/88, temperature  is 92.  General:  She is well-appearing in no distress.  HEENT:  Within  normal limits.  Lungs:  Clear bilaterally.  Cardiovascular:  Regular  rate and rhythm.  No murmur.  No carotid bruits.  Pedal pulses are not  palpable.  She has palpable femoral pulses.  Abdomen:  Soft, nontender.  Musculoskeletal:  Without major deformity or cyanosis.  NEURO:  She has  nonfocal exam.  Skin:  Without rash.   DIAGNOSTICS:  I have ordered and independently reviewed ultrasound which  reveals ankle brachial index of 0.88 on the right and 0.86 on the left  with biphasic waveforms.   ASSESSMENT/PLAN:  Bilateral leg pain.   PLAN:  Based on the description of the patient's symptoms I believe her  pain is multifactorial in etiology.  With her foot drop, she certainly  has a neurologic component to this.  She also gives the description of  pain shooting down the back of her leg beginning in the small of her  back.  This also would be more consistent with neuropathic in origin.  However, the patient does give pretty good description of claudication  and with the arteriogram performed by Dr. Johnsie Cancel which confirms severe  iliac arterial disease, I think it would be reasonable to proceed with  arteriogram to better evaluate her iliacs and potentially treat them to  see if this will help alleviate her symptoms.  The patient's groin  access from Dr. Kyla Balzarine procedures are very tender.  Therefore I would  like to wait 2 weeks before accessing her groins and this was the  preferred option of the patient.  Her arteriogram  has been scheduled for  Tuesday the 26 of July.     Eldridge Abrahams, MD  Electronically Signed   VWB/MEDQ  D:  09/05/2009  T:  09/06/2009  Job:  [id number]   cc:   Wallis Bamberg. Johnsie Cancel, MD, [facility]  Guadelupe Sabin

## 2010-07-14 NOTE — Procedures (Signed)
[facility]  Patient:    Olivia Walsh, Olivia Walsh                        MRN: [id number] Proc. Date: 07/31/00 Attending:  Nelda Severe. Kellie Simmering, M.D.                           Procedure Report  PREOPERATIVE DIAGNOSIS:  Right leg claudication secondary to aortoiliac and possible femoral-popliteal occlusive disease.  PROCEDURE:  Abdominal aortogram with bilateral lower extremity runoff via right common femoral approach.  SURGEON:  Nelda Severe. Kellie Simmering, M.D.  ANESTHESIA:  Local Xylocaine and Versed 2 mg intravenously.  DESCRIPTION OF PROCEDURE:  The patient was taken to the [facility], placed in the supine position, at which time both groins were prepped with Betadine solution and draped in a routine sterile manner. After infiltration of 1% Xylocaine, the right common femoral artery was entered percutaneously.  Guidewire passed into the suprarenal aorta under fluoroscopic guidance.  A 5-French sheath and dilator were passed over the guidewire, dilator removed, and a standard pigtail catheter positioned in the suprarenal aorta.  A flush abdominal aortogram was performed injecting 20 cc of contrast at 20 cc per second.  This revealed the aorta to be normal caliber to the renal arteries and slightly diminished in size from the renal arteries to the bifurcation, being a small caliber aorta.  The left renal artery was widely patent.  The right renal artery was almost totally occluded throughout with pruning of the distal vessel and very slow flow and a poor nephrogram. The inferior mesenteric artery was patent.  The catheter was drawn into the terminal aorta and bilateral iliac angiography performed using an RAO and an LAO projection.  This revealed a proximal stenosis of the left common iliac artery approximating 50% with the remainder of the left common and external iliac artery being relatively smooth with a patent left internal iliac  artery. The right iliac system was diffusely diseased with an 80% stenosis proximally and some ulceration a few centimeters distally.  The right external iliac artery had plaque throughout with a significant stenosis approximating 80% in the proximal common femoral artery down to just above the origin of the profunda.  Bilateral lower extremity angiograms were then performed injecting 80 cc of contrast at 8 cc per second and this revealed both superficial femoral-popliteal arteries to be widely patent with a mild degree of narrowing in the distal superficial femoral artery on the left but no narrowing on the right and there was three-vessel runoff bilaterally.  To get one better view of the right renal artery, an injection of 15 cc was performed using an RAO projection, which revealed severe disease throughout the right renal artery with slow flow.  Having tolerated the procedure well, the pigtail catheter was removed over the guidewire, the sheath removed, adequate compression applied, and no complications ensued.  FINDINGS: 1. Diffuse aortoiliac occlusive disease with diffuse disease throughout right    iliac system with 80% proximal stenosis of the right common iliac artery    and 70-80% stenosis of proximal right common femoral artery. 2. A 50% stenosis origin in the left common iliac artery. 3. Small caliber aorta. 4. Essentially total occlusion of small disease right renal artery with widely    patent left renal artery. 5. Mild narrowing distal left superficial femoral artery approximating 30-40%. 6. Widely patent right superficial femoral artery,  popliteal, and three-vessel    runoff bilaterally. DD:  07/31/00 TD:  07/31/00 Job: [id number]

## 2011-10-04 ENCOUNTER — Encounter: Payer: Self-pay | Admitting: Vascular Surgery

## 2012-09-03 ENCOUNTER — Encounter: Payer: Self-pay | Admitting: Gastroenterology

## 2012-09-11 ENCOUNTER — Encounter: Payer: Self-pay | Admitting: Physician Assistant

## 2012-09-18 ENCOUNTER — Ambulatory Visit: Payer: BC Managed Care – HMO | Admitting: Physician Assistant

## 2012-09-22 ENCOUNTER — Ambulatory Visit: Payer: BC Managed Care – HMO | Admitting: Physician Assistant

## 2012-10-15 ENCOUNTER — Encounter: Payer: BC Managed Care – HMO | Admitting: Gastroenterology

## 2013-02-04 ENCOUNTER — Encounter: Payer: Self-pay | Admitting: Internal Medicine

## 2013-02-23 ENCOUNTER — Other Ambulatory Visit: Payer: Self-pay | Admitting: Internal Medicine

## 2013-03-09 ENCOUNTER — Ambulatory Visit: Payer: Self-pay | Admitting: Internal Medicine

## 2013-03-16 DIAGNOSIS — E559 Vitamin D deficiency, unspecified: Secondary | ICD-10-CM | POA: Insufficient documentation

## 2013-03-17 ENCOUNTER — Ambulatory Visit: Payer: Self-pay | Admitting: Physician Assistant

## 2013-03-20 ENCOUNTER — Encounter: Payer: Self-pay | Admitting: Physician Assistant

## 2013-03-20 ENCOUNTER — Ambulatory Visit (INDEPENDENT_AMBULATORY_CARE_PROVIDER_SITE_OTHER): Payer: 59 | Admitting: Physician Assistant

## 2013-03-20 VITALS — BP 158/94 | HR 60 | Temp 97.5°F | Resp 18 | Wt 189.2 lb

## 2013-03-20 DIAGNOSIS — N259 Disorder resulting from impaired renal tubular function, unspecified: Secondary | ICD-10-CM

## 2013-03-20 DIAGNOSIS — E039 Hypothyroidism, unspecified: Secondary | ICD-10-CM

## 2013-03-20 DIAGNOSIS — I10 Essential (primary) hypertension: Secondary | ICD-10-CM

## 2013-03-20 DIAGNOSIS — E785 Hyperlipidemia, unspecified: Secondary | ICD-10-CM

## 2013-03-20 DIAGNOSIS — Z79899 Other long term (current) drug therapy: Secondary | ICD-10-CM

## 2013-03-20 DIAGNOSIS — R7303 Prediabetes: Secondary | ICD-10-CM

## 2013-03-20 DIAGNOSIS — E559 Vitamin D deficiency, unspecified: Secondary | ICD-10-CM

## 2013-03-20 LAB — CBC WITH DIFFERENTIAL/PLATELET
Basophils Absolute: 0 10*3/uL (ref 0.0–0.1)
Basophils Relative: 1 % (ref 0–1)
Eosinophils Absolute: 0.2 10*3/uL (ref 0.0–0.7)
Eosinophils Relative: 3 % (ref 0–5)
HCT: 41.9 % (ref 36.0–46.0)
Hemoglobin: 14 g/dL (ref 12.0–15.0)
Lymphocytes Relative: 23 % (ref 12–46)
Lymphs Abs: 1.6 10*3/uL (ref 0.7–4.0)
MCH: 30.7 pg (ref 26.0–34.0)
MCHC: 33.4 g/dL (ref 30.0–36.0)
MCV: 91.9 fL (ref 78.0–100.0)
Monocytes Absolute: 0.6 10*3/uL (ref 0.1–1.0)
Monocytes Relative: 8 % (ref 3–12)
Neutro Abs: 4.4 10*3/uL (ref 1.7–7.7)
Neutrophils Relative %: 65 % (ref 43–77)
Platelets: 189 10*3/uL (ref 150–400)
RBC: 4.56 MIL/uL (ref 3.87–5.11)
RDW: 14.4 % (ref 11.5–15.5)
WBC: 6.8 10*3/uL (ref 4.0–10.5)

## 2013-03-20 LAB — HEPATIC FUNCTION PANEL
ALT: 11 U/L (ref 0–35)
AST: 17 U/L (ref 0–37)
Albumin: 4.1 g/dL (ref 3.5–5.2)
Alkaline Phosphatase: 51 U/L (ref 39–117)
BILIRUBIN DIRECT: 0.1 mg/dL (ref 0.0–0.3)
Indirect Bilirubin: 0.3 mg/dL (ref 0.0–0.9)
Total Bilirubin: 0.4 mg/dL (ref 0.3–1.2)
Total Protein: 6.6 g/dL (ref 6.0–8.3)

## 2013-03-20 LAB — BASIC METABOLIC PANEL WITH GFR
BUN: 19 mg/dL (ref 6–23)
CO2: 32 mEq/L (ref 19–32)
Calcium: 9.1 mg/dL (ref 8.4–10.5)
Chloride: 102 mEq/L (ref 96–112)
Creat: 1.3 mg/dL — ABNORMAL HIGH (ref 0.50–1.10)
GFR, Est African American: 54 mL/min — ABNORMAL LOW
GFR, Est Non African American: 47 mL/min — ABNORMAL LOW
Glucose, Bld: 78 mg/dL (ref 70–99)
Potassium: 3.9 mEq/L (ref 3.5–5.3)
Sodium: 143 mEq/L (ref 135–145)

## 2013-03-20 LAB — LIPID PANEL
CHOL/HDL RATIO: 3.3 ratio
CHOLESTEROL: 134 mg/dL (ref 0–200)
HDL: 41 mg/dL (ref >39)
LDL Cholesterol: 70 mg/dL (ref 0–99)
Triglycerides: 116 mg/dL (ref <150)
VLDL: 23 mg/dL (ref 0–40)

## 2013-03-20 LAB — MAGNESIUM: Magnesium: 1.7 mg/dL (ref 1.5–2.5)

## 2013-03-20 LAB — TSH: TSH: 0.474 u[IU]/mL (ref 0.350–4.500)

## 2013-03-20 LAB — HEMOGLOBIN A1C
Hgb A1c MFr Bld: 5.5 % (ref <5.7)
Mean Plasma Glucose: 111 mg/dL (ref <117)

## 2013-03-20 NOTE — Patient Instructions (Addendum)
We are giving you chantix for smoking cessation. You can do it! And we are here to help! You may have heard some scary side effects about chantix, the three most common I hear about are nausea, crazy dreams and depression.  However, I like for my patients to try to stay on 1/2 a tablet twice a day rather than one tablet twice a day as normally prescribed. This helps decrease the chances of side effects and helps save money by making a one month prescription last two months  Please start the prescription this way:  Start 1/2 tablet by mouth once daily after food with a full glass of water for 3 days Then do 1/2 tablet by mouth twice daily for 4 days.  At this point we have several options: 1) continue on 1/2 tablet twice a day- which I encourage you to do. You can stay on this dose the rest of the time on the medication or if you still feel the need to smoke you can do one of the two options below. 2) do one tablet in the morning and 1/2 in the evening which helps decrease dreams. 3) do one tablet twice a day.   What if I miss a dose? If you miss a dose, take it as soon as you can. If it is almost time for your next dose, take only that dose. Do not take double or extra doses.  What should I watch for while using this medicine? Visit your doctor or health care professional for regular check ups. Ask for ongoing advice and encouragement from your doctor or healthcare professional, friends, and family to help you quit. If you smoke while on this medication, quit again  Your mouth may get dry. Chewing sugarless gum or hard candy, and drinking plenty of water may help. Contact your doctor if the problem does not go away or is severe.  You may get drowsy or dizzy. Do not drive, use machinery, or do anything that needs mental alertness until you know how this medicine affects you. Do not stand or sit up quickly, especially if you are an older patient.   The use of this medicine may increase the chance  of suicidal thoughts or actions. Pay special attention to how you are responding while on this medicine. Any worsening of mood, or thoughts of suicide or dying should be reported to your health care professional right away.  ADVANTAGES OF QUITTING SMOKING  Within 20 minutes, blood pressure decreases. Your pulse is at normal level.  After 8 hours, carbon monoxide levels in the blood return to normal. Your oxygen level increases.  After 24 hours, the chance of having a heart attack starts to decrease. Your breath, hair, and body stop smelling like smoke.  After 48 hours, damaged nerve endings begin to recover. Your sense of taste and smell improve.  After 72 hours, the body is virtually free of nicotine. Your bronchial tubes relax and breathing becomes easier.  After 2 to 12 weeks, lungs can hold more air. Exercise becomes easier and circulation improves.  After 1 year, the risk of coronary heart disease is cut in half.  After 5 years, the risk of stroke falls to the same as a nonsmoker.  After 10 years, the risk of lung cancer is cut in half and the risk of other cancers decreases significantly.  After 15 years, the risk of coronary heart disease drops, usually to the level of a nonsmoker.  You will have extra money   to spend on things other than cigarettes.  Follow up with your cardiologist.     Bad carbs also include fruit juice, alcohol, and sweet tea. These are empty calories that do not signal to your brain that you are full.   Please remember the good carbs are still carbs which convert into sugar. So please measure them out no more than 1/2-1 cup of rice, oatmeal, pasta, and beans.  Veggies are however free foods! Pile them on.   I like lean protein at every meal such as chicken, [place], pork chops, cottage cheese, etc. Just do not fry these meats and please center your meal around vegetable, the meats should be a side dish.   No all fruit is created equal. Please see the  list below, the fruit at the bottom is higher in sugars than the fruit at the top

## 2013-03-20 NOTE — Progress Notes (Signed)
HPI Patient presents for 3 month follow up with hypertension, hyperlipidemia, prediabetes and vitamin D. Patient's blood pressure has been controlled at home she states even this AM it was 120's, today their BP is BP: 158/94 mmHg  She states she was just on a bad conference call and increase stress from work. Patient denies chest pain, shortness of breath, dizziness. Her Losartan was increased to one pill last visit due to micro proteinuria.  States she only has one functioning kidney, last GFR was 43 with Cr of 1.39 and CO2 of 34.  Patient's cholesterol is diet controlled. In addition they are on crestor and denies myalgias. The cholesterol last visit was Trigs 164, HDL 37, LDL 88.  The patient has been working on diet and exercise for prediabetes, and denies changes in vision, polys, and paresthesias. A1C 5.6 Patient is on Vitamin D supplement.   Current Medications:  Current Outpatient Prescriptions on File Prior to Visit  Medication Sig Dispense Refill  . Cholecalciferol (VITAMIN D PO) Take 50,000 Int'l Units by mouth 3 (three) times a week.      . clopidogrel (PLAVIX) 75 MG tablet Take 75 mg by mouth daily with breakfast.      . furosemide (LASIX) 40 MG tablet Take 40 mg by mouth.      . labetalol (NORMODYNE) 300 MG tablet Take 300 mg by mouth 2 (two) times daily.      Marland Kitchen levETIRAcetam (KEPPRA) 500 MG tablet TAKE 1 TABLET TWICE A DAY FOR SEIZURES  180 tablet  0  . levothyroxine (SYNTHROID, LEVOTHROID) 75 MCG tablet Take 75 mcg by mouth daily before breakfast.      . losartan (COZAAR) 100 MG tablet Take 100 mg by mouth daily.      . rosuvastatin (CRESTOR) 20 MG tablet Take 20 mg by mouth daily.       No current facility-administered medications on file prior to visit.   Medical History:  Past Medical History  Diagnosis Date  . Vitamin D deficiency    Allergies:  Allergies  Allergen Reactions  . Ace Inhibitors Cough  . Penicillins   . Prednisone     Dysphoria     ROS Constitutional: Denies fever, chills, headaches, insomnia, fatigue, night sweats Eyes: Denies redness, blurred vision, diplopia, discharge, itchy, watery eyes.  ENT: Denies congestion, post nasal drip, sore throat, earache, dental pain, Tinnitus, Vertigo, Sinus pain, snoring.  Cardio: Denies chest pain, palpitations, irregular heartbeat, dyspnea, diaphoresis, orthopnea, PND, claudication, edema Respiratory: denies cough, shortness of breath, wheezing.  Gastrointestinal: Denies dysphagia, heartburn, AB pain/ cramps, N/V, diarrhea, constipation, hematemesis, melena, hematochezia,  hemorrhoids Genitourinary: Denies dysuria, frequency, urgency, nocturia, hesitancy, discharge, hematuria, flank pain Musculoskeletal: Denies myalgia, stiffness, pain, swelling and strain/sprain. Skin: Denies pruritis, rash, changing in skin lesion Neuro: Denies Weakness, tremor, incoordination, spasms, pain Psychiatric: Denies confusion, memory loss, sensory loss Endocrine: Denies change in weight, skin, hair change, nocturia Diabetic Polys, Denies visual blurring, hyper /hypo glycemic episodes, and paresthesia, Heme/Lymph: Denies Excessive bleeding, bruising, enlarged lymph nodes  Family history- Review and unchanged Social history- Review and unchanged Physical Exam: Filed Vitals:   03/20/13 1101  BP: 158/94  Pulse: 60  Temp: 97.5 F (36.4 C)  Resp: 18   Filed Weights   03/20/13 1101  Weight: 189 lb 3.2 oz (85.821 kg)   General Appearance: Well nourished, in no apparent distress. Eyes: PERRLA, EOMs, conjunctiva no swelling or erythema Sinuses: No Frontal/maxillary tenderness ENT/Mouth: Ext aud canals clear, TMs without erythema, bulging. No erythema,  swelling, or exudate on post pharynx.  Tonsils not swollen or erythematous. Hearing normal.  Neck: Supple, thyroid normal.  Respiratory: Respiratory effort normal, BS decreased bilaterally without rales, rhonchi, wheezing or stridor.  Cardio: RRR with  4/6 systolic murmur with prominent s2. With out RGs. Brisk peripheral pulses with edema L>R leg due to venous harvest from that leg.   Abdomen: Soft, + BS.  Non tender, no guarding, rebound, hernias, masses. Lymphatics: Non tender without lymphadenopathy.  Musculoskeletal: Full ROM, 5/5 strength, normal gait.  Skin: Warm, dry without rashes, lesions, ecchymosis.  Neuro: Cranial nerves intact. Normal muscle tone, no cerebellar symptoms. Sensation intact.  Psych: Awake and oriented X 3, normal affect, Insight and Judgment appropriate.   Assessment and Plan:  Hypertension: Continue medication, monitor blood pressure at home. The patient is hesitant to increase BP meds, I did explain her kidney function, CAD, CABG and risks but she prefers to monitor at home and next visit we may add/increase meds. I have also suggested that she follow up with a cardiologist since it has been years since she has seen one.  Hypertensive CKD- monitor GFR Long discussion about smoking cessation- she states she is not ready at this time Systolic murmur with CAD history, continues to smoke- suggest following up with cardiologist for evaluation/echo. No CP/SOB but very sedentary. Cholesterol: Continue diet and exercise. Check cholesterol.  Pre-diabetes-Continue diet and exercise. Check A1C Vitamin D Def- check level and continue medications.   OVER 40 minutes of exam, counseling, chart review, referral performed Continue diet and meds as discussed. Further disposition pending results of labs.  Vicie Mutters 11:17 AM

## 2013-03-21 LAB — LEVETIRACETAM LEVEL: Keppra (Levetiracetam): 30.1 ug/mL — ABNORMAL HIGH (ref 5.0–30.0)

## 2013-03-21 LAB — VITAMIN D 25 HYDROXY (VIT D DEFICIENCY, FRACTURES): Vit D, 25-Hydroxy: 90 ng/mL — ABNORMAL HIGH (ref 30–89)

## 2013-04-08 ENCOUNTER — Encounter: Payer: Self-pay | Admitting: Physician Assistant

## 2013-04-08 ENCOUNTER — Ambulatory Visit (INDEPENDENT_AMBULATORY_CARE_PROVIDER_SITE_OTHER): Payer: 59 | Admitting: Physician Assistant

## 2013-04-08 VITALS — BP 158/90 | HR 64 | Temp 98.1°F | Resp 18 | Wt 189.2 lb

## 2013-04-08 DIAGNOSIS — F17200 Nicotine dependence, unspecified, uncomplicated: Secondary | ICD-10-CM

## 2013-04-08 DIAGNOSIS — R05 Cough: Secondary | ICD-10-CM

## 2013-04-08 DIAGNOSIS — I739 Peripheral vascular disease, unspecified: Secondary | ICD-10-CM

## 2013-04-08 DIAGNOSIS — J209 Acute bronchitis, unspecified: Secondary | ICD-10-CM

## 2013-04-08 DIAGNOSIS — R059 Cough, unspecified: Secondary | ICD-10-CM

## 2013-04-08 DIAGNOSIS — R062 Wheezing: Secondary | ICD-10-CM

## 2013-04-08 MED ORDER — BENZONATATE 100 MG PO CAPS: 100.0000 mg | ORAL_CAPSULE | Freq: Three times a day (TID) | ORAL | Status: DC | PRN

## 2013-04-08 MED ORDER — LEVOFLOXACIN 500 MG PO TABS: 500.0000 mg | ORAL_TABLET | Freq: Every day | ORAL | Status: DC

## 2013-04-08 MED ORDER — PREDNISONE 20 MG PO TABS: ORAL_TABLET | ORAL | Status: DC

## 2013-04-08 NOTE — Patient Instructions (Signed)
Please take the prednisone to help decrease inflammation and therefore decrease symptoms. Take it it with food to avoid GI upset. It can cause increased energy but on the other hand it can make it hard to sleep at night so please take it in the morning.  It is not an antibiotic so you can stop it early if you are feeling better.  If you are diabetic it will increase your sugars.   The majority of colds are caused by viruses and do not require antibiotics. Please read the rest of this hand out to learn more about the common cold and what you can do to help yourself as well as help prevent the over use of antibiotics.   COMMON COLD SIGNS AND SYMPTOMS - The common cold usually causes nasal congestion, runny nose, and sneezing. A sore throat may be present on the first day but usually resolves quickly. If a cough occurs, it generally develops on about the fourth or fifth day of symptoms, typically when congestion and runny nose are resolving  COMMON COLD COMPLICATIONS - In most cases, colds do not cause serious illness or complications. Most colds last for three to seven days, although many people continue to have symptoms (coughing, sneezing, congestion) for up to two weeks.  One of the more common complications is sinusitis, which is usually caused by viruses and rarely (about 2 percent of the time) by bacteria. Having thick or yellow to green-colored nasal discharge does not mean that bacterial sinusitis has developed; discolored nasal discharge is a normal phase of the common cold.  Lower respiratory infections, such as pneumonia or bronchitis, may develop following a cold.  Infection of the middle ear, or otitis media, can accompany or follow a cold.  COMMON COLD TREATMENT - There is no specific treatment for the viruses that cause the common cold. Most treatments are aimed at relieving some of the symptoms of the cold, but do not shorten or cure the cold. Antibiotics are not useful for treating the  common cold; antibiotics are only used to treat illnesses caused by bacteria, not viruses. Unnecessary use of antibiotics for the treatment of the common cold can cause allergic reactions, diarrhea, or other gastrointestinal symptoms in some patients.  The symptoms of a cold will resolve over time, even without any treatment. People with underlying medical conditions and those who use other over-the-counter or prescription medications should speak with their healthcare provider or pharmacist to ensure that it is safe to use these treatments. The following are treatments that may reduce the symptoms caused by the common cold.  Nasal congestion - Decongestants are good for nasal congestion- if you feel very stuffy but no mucus is coming out, this is the medication that will help you the most.  Pseudoephedrine is a decongestant that can improve nasal congestion. Although a prescription is not required, drugstores in the [place] keep pseudoephedrine behind the counter, so it must be requested from a pharmacist. If you have a heart condition or high blood pressure please use Coricidin BPH instead.   Runny nose - Antihistamines such as diphenhydramine (Benadryl), certazine (Zyrtec) which are best taking at night because they can make you tired OR loratadine (Claritin),  fexafinadine (Allegra) help with a runny nose.   Nasal sprays such an oxymetazoline (Afrin and others) may also give temporary relief of nasal congestion. However, these sprays should never be used for more than two to three days; use for more than three days use can worsen congestion.    Nasocort is now over the counter and can help decrease a runny nose. Please stop the medication if you have blurry vision or nose bleeds.   Sore throat and headache - Sore throat and headache are best treated with a mild pain reliever such as acetaminophen (Tylenol) or a non-steroidal anti-inflammatory agent such as ibuprofen or naproxen (Motrin or Aleve).  These medications should be taken with food to prevent stomach problems. As well as gargling with warm water and salt.   Cough - Common cough medicine ingredients include guaifenesin and dextromethorphan; these are often combined with other medications in over-the-counter cold formulas. Often a cough is worse at night or first in the morning due to post nasal drip from you nose. You can try to sleep at an angle to decrease a cough.   Alternative treatments - Heated, humidified air can improve symptoms of nasal congestion and runny nose, and causes few to no side effects. A number of alternative products, including vitamin C, doubling up on your vitamin D and herbal products such as echinacea, may help. Certain products, such as nasal gels that contain zinc (eg, Zicam), have been associated with a permanent loss of smell.  Antibiotics - Antibiotics should not be used to treat an uncomplicated common cold. As noted above, colds are caused by viruses. Antibiotics treat bacterial, not viral infections. Some viruses that cause the common cold can also depress the immune system or cause swelling in the lining of the nose or airways; this can, in turn, lead to a bacterial infection. Often you need to give your body 7 days to fight off a common cold while treating the symptoms with the medications listed above. If after 7 days your symptoms are not improving, you are getting worse, you have shortness of breath, chest pain, a fever of over 103 you should seek medical help immediately.   PREVENTION IS THE BEST MEDICINE - Hand washing is an essential and highly effective way to prevent the spread of infection.  Alcohol-based hand rubs are a good alternative for disinfecting hands if a sink is not available.  Hands should be washed before preparing food and eating and after coughing, blowing the nose, or sneezing. While it is not always possible to limit contact with people who may be infected with a cold, touching  the eyes, nose, or mouth after direct contact should be avoided when possible. Sneezing/coughing into the sleeve of one's clothing (at the inner elbow) is another means of containing sprays of saliva and secretions and does not contaminate the hands.    We are giving you chantix for smoking cessation. You can do it! And we are here to help! You may have heard some scary side effects about chantix, the three most common I hear about are nausea, crazy dreams and depression.  However, I like for my patients to try to stay on 1/2 a tablet twice a day rather than one tablet twice a day as normally prescribed. This helps decrease the chances of side effects and helps save money by making a one month prescription last two months  Please start the prescription this way:  Start 1/2 tablet by mouth once daily after food with a full glass of water for 3 days Then do 1/2 tablet by mouth twice daily for 4 days.  At this point we have several options: 1) continue on 1/2 tablet twice a day- which I encourage you to do. You can stay on this dose the rest of the time   on the medication or if you still feel the need to smoke you can do one of the two options below. 2) do one tablet in the morning and 1/2 in the evening which helps decrease dreams. 3) do one tablet twice a day.   What if I miss a dose? If you miss a dose, take it as soon as you can. If it is almost time for your next dose, take only that dose. Do not take double or extra doses.  What should I watch for while using this medicine? Visit your doctor or health care professional for regular check ups. Ask for ongoing advice and encouragement from your doctor or healthcare professional, friends, and family to help you quit. If you smoke while on this medication, quit again  Your mouth may get dry. Chewing sugarless gum or hard candy, and drinking plenty of water may help. Contact your doctor if the problem does not go away or is severe.  You may get  drowsy or dizzy. Do not drive, use machinery, or do anything that needs mental alertness until you know how this medicine affects you. Do not stand or sit up quickly, especially if you are an older patient.   The use of this medicine may increase the chance of suicidal thoughts or actions. Pay special attention to how you are responding while on this medicine. Any worsening of mood, or thoughts of suicide or dying should be reported to your health care professional right away.  ADVANTAGES OF QUITTING SMOKING  Within 20 minutes, blood pressure decreases. Your pulse is at normal level.  After 8 hours, carbon monoxide levels in the blood return to normal. Your oxygen level increases.  After 24 hours, the chance of having a heart attack starts to decrease. Your breath, hair, and body stop smelling like smoke.  After 48 hours, damaged nerve endings begin to recover. Your sense of taste and smell improve.  After 72 hours, the body is virtually free of nicotine. Your bronchial tubes relax and breathing becomes easier.  After 2 to 12 weeks, lungs can hold more air. Exercise becomes easier and circulation improves.  After 1 year, the risk of coronary heart disease is cut in half.  After 5 years, the risk of stroke falls to the same as a nonsmoker.  After 10 years, the risk of lung cancer is cut in half and the risk of other cancers decreases significantly.  After 15 years, the risk of coronary heart disease drops, usually to the level of a nonsmoker.  You will have extra money to spend on things other than cigarettes.   

## 2013-04-08 NOTE — Progress Notes (Signed)
   Subjective:    Patient ID: Olivia Walsh, female    DOB: 05-Nov-1960, 53 y.o.   MRN: [id number]  Cough This is a new problem. Episode onset: 2 weeks. The problem has been gradually worsening. The problem occurs constantly. The cough is non-productive. Associated symptoms include rhinorrhea, a sore throat, shortness of breath and wheezing. Pertinent negatives include no chills, ear pain or fever. Risk factors for lung disease include smoking/tobacco exposure. She has tried nothing for the symptoms. The treatment provided no relief.    Review of Systems  Constitutional: Positive for fatigue. Negative for fever and chills.  HENT: Positive for congestion, rhinorrhea, sinus pressure and sore throat. Negative for dental problem, ear discharge, ear pain, nosebleeds, trouble swallowing and voice change.   Respiratory: Positive for cough, chest tightness, shortness of breath and wheezing.   Cardiovascular: Negative.   Gastrointestinal: Negative.   Genitourinary: Negative.   Musculoskeletal: Negative.   Neurological: Negative.        Objective:   Physical Exam  Constitutional: She is oriented to person, place, and time. She appears well-developed and well-nourished.  HENT:  Head: Normocephalic and atraumatic.  Right Ear: Hearing, tympanic membrane and external ear normal.  Left Ear: Hearing and external ear normal. Tympanic membrane is erythematous. A middle ear effusion is present.  Nose: Right sinus exhibits maxillary sinus tenderness. Left sinus exhibits maxillary sinus tenderness.  Mouth/Throat: Oropharynx is clear and moist.  Eyes: Conjunctivae and EOM are normal. Pupils are equal, round, and reactive to light.  Neck: Normal range of motion. Neck supple. No thyromegaly present.  Cardiovascular: Normal rate, regular rhythm, S1 normal and S2 normal.  Exam reveals no gallop and no friction rub.   Murmur heard.  Systolic murmur is present with a grade of 3/6  Pulmonary/Chest: Effort  normal. No respiratory distress. She has no decreased breath sounds. She has wheezes in the left middle field and the left lower field. She has no rhonchi. She has no rales.  Abdominal: Soft. Bowel sounds are normal. She exhibits no distension and no mass. There is no tenderness. There is no rebound and no guarding.  Musculoskeletal: Normal range of motion.  Lymphadenopathy:    She has no cervical adenopathy.  Neurological: She is alert and oriented to person, place, and time. She displays normal reflexes. No cranial nerve deficit. Coordination normal.  Skin: Skin is warm and dry.  Psychiatric: She has a normal mood and affect.       Assessment & Plan:  Acute bronchitis - Plan: levofloxacin (LEVAQUIN) 500 MG tablet, benzonatate (TESSALON) 100 MG capsule, predniSONE (DELTASONE) 20 MG tablet, DG Chest 2 View  Long discussion about smoking cessation  Last CXR 2009- get one today

## 2013-04-16 ENCOUNTER — Other Ambulatory Visit: Payer: Self-pay | Admitting: Internal Medicine

## 2013-04-22 ENCOUNTER — Other Ambulatory Visit: Payer: Self-pay | Admitting: Emergency Medicine

## 2013-04-22 MED ORDER — CLOPIDOGREL BISULFATE 75 MG PO TABS: ORAL_TABLET | ORAL | Status: DC

## 2013-04-25 ENCOUNTER — Other Ambulatory Visit: Payer: Self-pay | Admitting: Internal Medicine

## 2013-05-23 ENCOUNTER — Other Ambulatory Visit: Payer: Self-pay | Admitting: Internal Medicine

## 2013-06-17 ENCOUNTER — Ambulatory Visit: Payer: Self-pay | Admitting: Emergency Medicine

## 2013-06-25 ENCOUNTER — Ambulatory Visit: Payer: Self-pay | Admitting: Emergency Medicine

## 2013-06-28 ENCOUNTER — Other Ambulatory Visit: Payer: Self-pay | Admitting: Emergency Medicine

## 2013-07-02 ENCOUNTER — Encounter: Payer: Self-pay | Admitting: Physician Assistant

## 2013-07-02 ENCOUNTER — Ambulatory Visit (INDEPENDENT_AMBULATORY_CARE_PROVIDER_SITE_OTHER): Payer: 59 | Admitting: Physician Assistant

## 2013-07-02 VITALS — BP 122/70 | HR 64 | Temp 98.4°F | Resp 16 | Ht 68.0 in | Wt 181.0 lb

## 2013-07-02 DIAGNOSIS — I2510 Atherosclerotic heart disease of native coronary artery without angina pectoris: Secondary | ICD-10-CM

## 2013-07-02 DIAGNOSIS — I6350 Cerebral infarction due to unspecified occlusion or stenosis of unspecified cerebral artery: Secondary | ICD-10-CM

## 2013-07-02 DIAGNOSIS — E039 Hypothyroidism, unspecified: Secondary | ICD-10-CM

## 2013-07-02 DIAGNOSIS — R011 Cardiac murmur, unspecified: Secondary | ICD-10-CM

## 2013-07-02 DIAGNOSIS — I10 Essential (primary) hypertension: Secondary | ICD-10-CM

## 2013-07-02 DIAGNOSIS — R7303 Prediabetes: Secondary | ICD-10-CM

## 2013-07-02 DIAGNOSIS — N259 Disorder resulting from impaired renal tubular function, unspecified: Secondary | ICD-10-CM

## 2013-07-02 DIAGNOSIS — E785 Hyperlipidemia, unspecified: Secondary | ICD-10-CM

## 2013-07-02 DIAGNOSIS — F17200 Nicotine dependence, unspecified, uncomplicated: Secondary | ICD-10-CM

## 2013-07-02 DIAGNOSIS — E559 Vitamin D deficiency, unspecified: Secondary | ICD-10-CM

## 2013-07-02 DIAGNOSIS — Z79899 Other long term (current) drug therapy: Secondary | ICD-10-CM

## 2013-07-02 MED ORDER — LEVOTHYROXINE SODIUM 75 MCG PO TABS: ORAL_TABLET | ORAL | Status: DC

## 2013-07-02 NOTE — Progress Notes (Addendum)
HPI 53 y.o. female  presents for 3 month follow up with hypertension, hyperlipidemia, prediabetes and vitamin D. Her blood pressure has been controlled at home, today their BP is BP: 122/70 mmHg She does not workout, she has left drop foot. She denies chest pain, shortness of breath, dizziness.  She is on cholesterol medication and denies myalgias. Her cholesterol is at goal. The cholesterol last visit was:   Lab Results  Component Value Date   CHOL 134 03/20/2013   HDL 41 03/20/2013   LDLCALC 70 03/20/2013   TRIG 116 03/20/2013   CHOLHDL 3.3 03/20/2013   She has been working on diet and exercise for prediabetes, and denies paresthesia of the feet, polydipsia, polyuria and visual disturbances. Last A1C in the office was:  Lab Results  Component Value Date   HGBA1C 5.5 03/20/2013   Patient is on Vitamin D supplement.   She continues to smoke and states she does not want to stop at this point.  History of CABG at 89, has not seen a heart doctor in years, denies SOB, CP, dizziness however is very sedentary.  Current Medications:  Current Outpatient Prescriptions on File Prior to Visit  Medication Sig Dispense Refill  . Cholecalciferol (VITAMIN D PO) Take 50,000 Int'l Units by mouth 3 (three) times a week.      . clopidogrel (PLAVIX) 75 MG tablet TAKE 1 TABLET TWICE A DAY TO PREVENT STROKE  180 tablet  0  . furosemide (LASIX) 40 MG tablet Take 40 mg by mouth.      . labetalol (NORMODYNE) 300 MG tablet Take 300 mg by mouth 2 (two) times daily.      Marland Kitchen levETIRAcetam (KEPPRA) 500 MG tablet TAKE 1 TABLET TWICE A DAY FOR SEIZURES  180 tablet  1  . levothyroxine (SYNTHROID, LEVOTHROID) 75 MCG tablet TAKE 1 TABLET DAILY  90 tablet  0  . losartan (COZAAR) 100 MG tablet Take 100 mg by mouth daily.      . rosuvastatin (CRESTOR) 20 MG tablet Take 20 mg by mouth daily.       No current facility-administered medications on file prior to visit.   Medical History:  Past Medical History  Diagnosis Date  .  Vitamin D deficiency    Allergies:  Allergies  Allergen Reactions  . Ace Inhibitors Cough  . Penicillins   . Prednisone     Dysphoria     Review of Systems: [X]  = complains of  [ ]  = denies  General: Fatigue [ ]  Fever [ ]  Chills [ ]  Weakness [ ]   Insomnia [ ]  Eyes: Redness [ ]  Blurred vision [ ]  Diplopia [ ]   ENT: Congestion [ ]  Sinus Pain [ ]  Post Nasal Drip [ ]  Sore Throat [ ]  Earache [ ]   Cardiac: Chest pain/pressure [ ]  SOB [ ]  Orthopnea [ ]   Palpitations [ ]   Paroxysmal nocturnal dyspnea[ ]  Claudication [ ]  Edema [ ]   Pulmonary: Cough [ ]  Wheezing[ ]   SOB [ ]   Snoring [ ]   GI: Nausea [ ]  Vomiting[ ]  Dysphagia[ ]  Heartburn[ ]  Abdominal pain [ ]  Constipation [ ] ; Diarrhea [ ] ; BRBPR [ ]  Melena[ ]  GU: Hematuria[ ]  Dysuria [ ]  Nocturia[ ]  Urgency [ ]   Hesitancy [ ]  Discharge [ ]  Neuro: Headaches[ ]  Vertigo[ ]  Paresthesias[ ]  Spasm [ ]  Speech changes [ ]  Incoordination [ ]   Ortho: Arthritis [ ]  Joint pain [ ]  Muscle pain [ ]  Joint swelling [ ]  Back Pain [ ]   Skin:  Rash [ ]   Pruritis [ ]  Change in skin lesion [ ]   Psych: Depression[ ]  Anxiety[ ]  Confusion [ ]  Memory loss [ ]   Heme/Lypmh: Bleeding [ ]  Bruising [ ]  Enlarged lymph nodes [ ]   Endocrine: Visual blurring [ ]  Paresthesia [ ]  Polyuria [ ]  Polydypsea [ ]    Heat/cold intolerance [ ]  Hypoglycemia [ ]   Family history- Review and unchanged Social history- Review and unchanged Physical Exam: BP 122/70  Pulse 64  Temp(Src) 98.4 F (36.9 C)  Resp 16  Ht 5\' 8"  (1.727 m)  Wt 181 lb (82.101 kg)  BMI 27.53 kg/m2 Wt Readings from Last 3 Encounters:  07/02/13 181 lb (82.101 kg)  04/08/13 189 lb 3.2 oz (85.821 kg)  03/20/13 189 lb 3.2 oz (85.821 kg)   General Appearance: Well nourished, in no apparent distress. Eyes: PERRLA, EOMs, conjunctiva no swelling or erythema Sinuses: No Frontal/maxillary tenderness ENT/Mouth: Ext aud canals clear, TMs without erythema, bulging. No erythema, swelling, or exudate on post pharynx.   Tonsils not swollen or erythematous. Hearing normal.  Neck: Supple, thyroid normal.  Respiratory: Respiratory effort normal, BS equal bilaterally without rales, rhonchi, wheezing or stridor.  Cardio: RRR with 3/6 decrescendo systolic murmur. Decreased bilateral pulses with 2+ edema L>R.   Abdomen: Soft, + BS.  Non tender, no guarding, rebound, hernias, masses. Lymphatics: Non tender without lymphadenopathy.  Musculoskeletal: Full ROM, 5/5 strength, normal gait.  Skin: Warm, dry without rashes, lesions, ecchymosis.  Neuro: Cranial nerves intact. Normal muscle tone, no cerebellar symptoms. Sensation intact.  Psych: Awake and oriented X 3, normal affect, Insight and Judgment appropriate.   Assessment and Plan:  Hypertension: Continue medication, monitor blood pressure at home. Continue DASH diet. Cholesterol: Continue diet and exercise. Check cholesterol.  Pre-diabetes-Continue diet and exercise. Check A1C Vitamin D Def- check level and continue medications.  Smoker- not ready for smoking cessation currently despite knowing the risk of MI, stroke, cancer, and death History of CABG at 74, has not seen a heart doctor in years, denies SOB, CP, dizziness however is very sedentary and with new systolic murmur will refer to cardio for evaluation and treatment and likely Echo  Continue diet and meds as discussed. Further disposition pending results of labs. OVER 40 minutes of exam, counseling, chart review, referral performed  Vicie Mutters 4:21 PM

## 2013-07-02 NOTE — Patient Instructions (Signed)
Heart Murmur A heart murmur is an extra sound heard by your health care provider when listening to your heart with a device called a stethoscope. The sound comes from turbulence when blood flows through the heart and may be a "hum" or "whoosh" sound heard when the heart beats. There are two types of heart murmurs:  Innocent murmurs Most people with this type of heart murmur do not have a heart problem. Many children have innocent heart murmurs. Your health care provider may suggest some basic testing to know whether your murmur is an innocent murmur. If an innocent heart murmur is found, there is no need for further tests or treatment and no need to restrict activities or stop playing sports.  Abnormal murmurs These types of murmurs can occur in children and adults. In children, abnormal heart murmurs are typically caused from heart defects that are present at birth (congenital). In adults, abnormal murmurs are usually from heart valve problems caused by disease, infection, or aging. CAUSES  All heart murmurs are a result of an issue with your heart valves. Normally, these valves open to let blood flow through or out of your heart and then shut to keep it from flowing backward. If they do not work properly, you could have:  Regurgitation When blood leaks back through the valve in the wrong direction.  Mitral valve prolapse When the mitral valve of the heart has a loose flap and does not close tightly.  Stenosis When the valve does not open enough and blocks blood flow. SIGNS AND SYMPTOMS  Innocent murmurs do not cause symptoms, and many people with abnormal murmurs may or may not have symptoms. If symptoms do develop, they may include:  Shortness of breath.  Blue coloring of the skin, especially on the fingertips.  Chest pain.  Palpitations, or feeling a fluttering or skipped heartbeat.  Fainting.  Persistent cough.  Getting tired much faster than expected. DIAGNOSIS  A heart murmur  might be heard during a sports physical or during any type of examination. When a murmur is heard, it may suggest a possible problem. When this happens, your health care provider may ask you to see a heart specialist (cardiologist). You may also be asked to have one or more heart tests. In these cases, testing may vary depending on what your health care provider heard. Tests for a heart murmur may include:  Electrocardiogram.  Echocardiogram.  MRI. For children and adults who have an abnormal heart murmur and want to play sports, it is important to complete testing, review test results, and receive recommendations from your health care provider. If heart disease is present, it may not be safe to play. TREATMENT  Innocent murmurs require no treatment or activity restriction. If an abnormal murmur represents a problem with the heart, treatment will depend on the exact nature of the problem. In these cases, medicine or surgery may be needed to treat the problem. HOME CARE INSTRUCTIONS If you want to participate in sports or other types of strenuous physical activity, it is important to discuss this first with your health care provider. If the murmur represents a problem with the heart and you choose to participate in sports, there is a small chance that a serious problem (including sudden death) could result.  SEEK MEDICAL CARE IF:   You feel that your symptoms are slowly worsening.  You develop any new symptoms that cause concern.  You feel that you are having side effects from any medicines prescribed. SEEK IMMEDIATE  MEDICAL CARE IF:   You develop chest pain.  You have shortness of breath.  You notice that your heart beats irregularly often enough to cause you to worry.  You have fainting spells.  Your symptoms suddenly get worse. Document Released: 03/22/2004 Document Revised: 12/03/2012 Document Reviewed: 10/20/2012 [facility] Patient Information 2014 [place]. Smoking  Cessation Quitting smoking is important to your health and has many advantages. However, it is not always easy to quit since nicotine is a very addictive drug. Often times, people try 3 times or more before being able to quit. This document explains the best ways for you to prepare to quit smoking. Quitting takes hard work and a lot of effort, but you can do it. ADVANTAGES OF QUITTING SMOKING  You will live longer, feel better, and live better.  Your body will feel the impact of quitting smoking almost immediately.  Within 20 minutes, blood pressure decreases. Your pulse returns to its normal level.  After 8 hours, carbon monoxide levels in the blood return to normal. Your oxygen level increases.  After 24 hours, the chance of having a heart attack starts to decrease. Your breath, hair, and body stop smelling like smoke.  After 48 hours, damaged nerve endings begin to recover. Your sense of taste and smell improve.  After 72 hours, the body is virtually free of nicotine. Your bronchial tubes relax and breathing becomes easier.  After 2 to 12 weeks, lungs can hold more air. Exercise becomes easier and circulation improves.  The risk of having a heart attack, stroke, cancer, or lung disease is greatly reduced.  After 1 year, the risk of coronary heart disease is cut in half.  After 5 years, the risk of stroke falls to the same as a nonsmoker.  After 10 years, the risk of lung cancer is cut in half and the risk of other cancers decreases significantly.  After 15 years, the risk of coronary heart disease drops, usually to the level of a nonsmoker.  If you are pregnant, quitting smoking will improve your chances of having a healthy baby.  The people you live with, especially any children, will be healthier.  You will have extra money to spend on things other than cigarettes. QUESTIONS TO THINK ABOUT BEFORE ATTEMPTING TO QUIT You may want to talk about your answers with your  caregiver.  Why do you want to quit?  If you tried to quit in the past, what helped and what did not?  What will be the most difficult situations for you after you quit? How will you plan to handle them?  Who can help you through the tough times? Your family? Friends? A caregiver?  What pleasures do you get from smoking? What ways can you still get pleasure if you quit? Here are some questions to ask your caregiver:  How can you help me to be successful at quitting?  What medicine do you think would be best for me and how should I take it?  What should I do if I need more help?  What is smoking withdrawal like? How can I get information on withdrawal? GET READY  Set a quit date.  Change your environment by getting rid of all cigarettes, ashtrays, matches, and lighters in your home, car, or work. Do not let people smoke in your home.  Review your past attempts to quit. Think about what worked and what did not. GET SUPPORT AND ENCOURAGEMENT You have a better chance of being successful if you  have help. You can get support in many ways.  Tell your family, friends, and co-workers that you are going to quit and need their support. Ask them not to smoke around you.  Get individual, group, or telephone counseling and support. Programs are available at [employer] and health centers. Call your local health department for information about programs in your area.  Spiritual beliefs and practices may help some smokers quit.  Download a "quit meter" on your computer to keep track of quit statistics, such as how long you have gone without smoking, cigarettes not smoked, and money saved.  Get a self-help book about quitting smoking and staying off of tobacco. [place] yourself from urges to smoke. Talk to someone, go for a walk, or occupy your time with a task.  Change your normal routine. Take a different route to work. Drink tea instead of coffee.  Eat breakfast in a different place.  Reduce your stress. Take a hot bath, exercise, or read a book.  Plan something enjoyable to do every day. Reward yourself for not smoking.  Explore interactive web-based programs that specialize in helping you quit. GET MEDICINE AND USE IT CORRECTLY Medicines can help you stop smoking and decrease the urge to smoke. Combining medicine with the above behavioral methods and support can greatly increase your chances of successfully quitting smoking.  Nicotine replacement therapy helps deliver nicotine to your body without the negative effects and risks of smoking. Nicotine replacement therapy includes nicotine gum, lozenges, inhalers, nasal sprays, and skin patches. Some may be available over-the-counter and others require a prescription.  Antidepressant medicine helps people abstain from smoking, but how this works is unknown. This medicine is available by prescription.  Nicotinic receptor partial agonist medicine simulates the effect of nicotine in your brain. This medicine is available by prescription. Ask your caregiver for advice about which medicines to use and how to use them based on your health history. Your caregiver will tell you what side effects to look out for if you choose to be on a medicine or therapy. Carefully read the information on the package. Do not use any other product containing nicotine while using a nicotine replacement product.  RELAPSE OR DIFFICULT SITUATIONS Most relapses occur within the first 3 months after quitting. Do not be discouraged if you start smoking again. Remember, most people try several times before finally quitting. You may have symptoms of withdrawal because your body is used to nicotine. You may crave cigarettes, be irritable, feel very hungry, cough often, get headaches, or have difficulty concentrating. The withdrawal symptoms are only temporary. They are strongest when you first quit, but they will go away within  10 14 days. To reduce the chances of relapse, try to:  Avoid drinking alcohol. Drinking lowers your chances of successfully quitting.  Reduce the amount of caffeine you consume. Once you quit smoking, the amount of caffeine in your body increases and can give you symptoms, such as a rapid heartbeat, sweating, and anxiety.  Avoid smokers because they can make you want to smoke.  Do not let weight gain distract you. Many smokers will gain weight when they quit, usually less than 10 pounds. Eat a healthy diet and stay active. You can always lose the weight gained after you quit.  Find ways to improve your mood other than smoking. FOR MORE INFORMATION  www.smokefree.gov  Document Released: 02/06/2001 Document Revised: 08/14/2011 Document Reviewed: 05/24/2011 [facility] Patient Information 2014 [place], [place].

## 2013-07-02 NOTE — Addendum Note (Signed)
Addended by: Vicie Mutters R on: 07/02/2013 04:42 PM   Modules accepted: Level of Service

## 2013-07-03 ENCOUNTER — Encounter: Payer: Self-pay | Admitting: Physician Assistant

## 2013-07-03 ENCOUNTER — Telehealth: Payer: Self-pay | Admitting: Physician Assistant

## 2013-07-03 ENCOUNTER — Ambulatory Visit (HOSPITAL_COMMUNITY)
Admission: RE | Admit: 2013-07-03 | Discharge: 2013-07-03 | Disposition: A | Discharge status: Home / Self Care | Payer: 59 | Source: Ambulatory Visit | Attending: Physician Assistant | Admitting: Physician Assistant

## 2013-07-03 DIAGNOSIS — F17200 Nicotine dependence, unspecified, uncomplicated: Secondary | ICD-10-CM | POA: Insufficient documentation

## 2013-07-03 DIAGNOSIS — R05 Cough: Secondary | ICD-10-CM | POA: Insufficient documentation

## 2013-07-03 DIAGNOSIS — I10 Essential (primary) hypertension: Secondary | ICD-10-CM | POA: Insufficient documentation

## 2013-07-03 DIAGNOSIS — R059 Cough, unspecified: Secondary | ICD-10-CM | POA: Insufficient documentation

## 2013-07-03 LAB — CBC WITH DIFFERENTIAL/PLATELET
BASOS ABS: 0.1 10*3/uL (ref 0.0–0.1)
Basophils Relative: 1 % (ref 0–1)
EOS PCT: 4 % (ref 0–5)
Eosinophils Absolute: 0.3 10*3/uL (ref 0.0–0.7)
HCT: 41.4 % (ref 36.0–46.0)
Hemoglobin: 14.3 g/dL (ref 12.0–15.0)
LYMPHS PCT: 30 % (ref 12–46)
Lymphs Abs: 2.1 10*3/uL (ref 0.7–4.0)
MCH: 30.1 pg (ref 26.0–34.0)
MCHC: 34.5 g/dL (ref 30.0–36.0)
MCV: 87.2 fL (ref 78.0–100.0)
Monocytes Absolute: 0.6 10*3/uL (ref 0.1–1.0)
Monocytes Relative: 9 % (ref 3–12)
NEUTROS PCT: 56 % (ref 43–77)
Neutro Abs: 4 10*3/uL (ref 1.7–7.7)
PLATELETS: 213 10*3/uL (ref 150–400)
RBC: 4.75 MIL/uL (ref 3.87–5.11)
RDW: 14.2 % (ref 11.5–15.5)
WBC: 7.1 10*3/uL (ref 4.0–10.5)

## 2013-07-03 LAB — HEPATIC FUNCTION PANEL
ALBUMIN: 4.2 g/dL (ref 3.5–5.2)
ALT: 15 U/L (ref 0–35)
AST: 23 U/L (ref 0–37)
Alkaline Phosphatase: 53 U/L (ref 39–117)
BILIRUBIN DIRECT: 0.1 mg/dL (ref 0.0–0.3)
BILIRUBIN TOTAL: 0.4 mg/dL (ref 0.2–1.2)
Indirect Bilirubin: 0.3 mg/dL (ref 0.2–1.2)
Total Protein: 6.7 g/dL (ref 6.0–8.3)

## 2013-07-03 LAB — VITAMIN D 25 HYDROXY (VIT D DEFICIENCY, FRACTURES): VIT D 25 HYDROXY: 107 ng/mL — AB (ref 30–89)

## 2013-07-03 LAB — LIPID PANEL
CHOL/HDL RATIO: 3.3 ratio
CHOLESTEROL: 126 mg/dL (ref 0–200)
HDL: 38 mg/dL — ABNORMAL LOW (ref >39)
LDL Cholesterol: 54 mg/dL (ref 0–99)
Triglycerides: 170 mg/dL — ABNORMAL HIGH (ref <150)
VLDL: 34 mg/dL (ref 0–40)

## 2013-07-03 LAB — TSH: TSH: 0.631 u[IU]/mL (ref 0.350–4.500)

## 2013-07-03 LAB — HEMOGLOBIN A1C
Hgb A1c MFr Bld: 6.1 % — ABNORMAL HIGH (ref <5.7)
Mean Plasma Glucose: 128 mg/dL — ABNORMAL HIGH (ref <117)

## 2013-07-03 LAB — BASIC METABOLIC PANEL WITH GFR
BUN: 28 mg/dL — ABNORMAL HIGH (ref 6–23)
CALCIUM: 9.4 mg/dL (ref 8.4–10.5)
CO2: 35 mEq/L — ABNORMAL HIGH (ref 19–32)
CREATININE: 1.62 mg/dL — AB (ref 0.50–1.10)
Chloride: 88 mEq/L — ABNORMAL LOW (ref 96–112)
GFR, Est African American: 41 mL/min — ABNORMAL LOW
GFR, Est Non African American: 35 mL/min — ABNORMAL LOW
Glucose, Bld: 92 mg/dL (ref 70–99)
Potassium: 2.7 mEq/L — CL (ref 3.5–5.3)
SODIUM: 136 meq/L (ref 135–145)

## 2013-07-03 LAB — INSULIN, FASTING: Insulin fasting, serum: 30 u[IU]/mL — ABNORMAL HIGH (ref 3–28)

## 2013-07-03 LAB — MAGNESIUM: MAGNESIUM: 1.9 mg/dL (ref 1.5–2.5)

## 2013-07-03 MED ORDER — POTASSIUM CHLORIDE CRYS ER 20 MEQ PO TBCR: 20.0000 meq | EXTENDED_RELEASE_TABLET | Freq: Four times a day (QID) | ORAL | Status: DC

## 2013-07-03 NOTE — Telephone Encounter (Signed)
Called patient about her labs. Insisted the patient go to the ER due to ARF, hypokalemia, metabolic alkalosis and respiratory acidosis however patient states she feels fine, states she has some fatigue but works 10 hours a day and it is normal for her. Denies SOB, CP, myalgia, nausea, palps, etc. Urged patient to go to the ER but she is AMA. We will cut back on her lasix from 2 to 1 pill a day, put her on potassium 71meq QID, and get a CXR. She will have a lab only on Monday for a stat BMP.

## 2013-07-06 ENCOUNTER — Other Ambulatory Visit: Payer: 59

## 2013-07-06 DIAGNOSIS — E876 Hypokalemia: Secondary | ICD-10-CM

## 2013-07-06 DIAGNOSIS — N179 Acute kidney failure, unspecified: Secondary | ICD-10-CM

## 2013-07-06 LAB — BASIC METABOLIC PANEL WITH GFR
BUN: 19 mg/dL (ref 6–23)
CO2: 30 mEq/L (ref 19–32)
Calcium: 8.8 mg/dL (ref 8.4–10.5)
Chloride: 98 mEq/L (ref 96–112)
Creat: 1.39 mg/dL — ABNORMAL HIGH (ref 0.50–1.10)
GFR, EST AFRICAN AMERICAN: 49 mL/min — AB
GFR, EST NON AFRICAN AMERICAN: 43 mL/min — AB
Glucose, Bld: 125 mg/dL — ABNORMAL HIGH (ref 70–99)
POTASSIUM: 3.3 meq/L — AB (ref 3.5–5.3)
SODIUM: 135 meq/L (ref 135–145)

## 2013-07-06 LAB — MAGNESIUM: Magnesium: 1.9 mg/dL (ref 1.5–2.5)

## 2013-07-08 ENCOUNTER — Telehealth: Payer: Self-pay

## 2013-07-08 MED ORDER — LABETALOL HCL 300 MG PO TABS: 300.0000 mg | ORAL_TABLET | Freq: Two times a day (BID) | ORAL | Status: DC

## 2013-07-08 NOTE — Telephone Encounter (Signed)
Pt requesting to speak to Butch Penny. Pt also needs refill Labetalol 300mg . [facility]

## 2013-07-08 NOTE — Telephone Encounter (Signed)
Return call to patient, she wanted her results from May 7, released to [place], released labs as Estill Bamberg has already discussed with her and documented, refilled her [facility]

## 2013-07-13 ENCOUNTER — Other Ambulatory Visit: Payer: 59

## 2013-07-13 DIAGNOSIS — R899 Unspecified abnormal finding in specimens from other organs, systems and tissues: Secondary | ICD-10-CM

## 2013-07-13 LAB — BASIC METABOLIC PANEL WITH GFR
BUN: 15 mg/dL (ref 6–23)
CHLORIDE: 103 meq/L (ref 96–112)
CO2: 30 mEq/L (ref 19–32)
Calcium: 9 mg/dL (ref 8.4–10.5)
Creat: 1.33 mg/dL — ABNORMAL HIGH (ref 0.50–1.10)
GFR, EST AFRICAN AMERICAN: 52 mL/min — AB
GFR, Est Non African American: 45 mL/min — ABNORMAL LOW
Glucose, Bld: 139 mg/dL — ABNORMAL HIGH (ref 70–99)
POTASSIUM: 4.2 meq/L (ref 3.5–5.3)
SODIUM: 139 meq/L (ref 135–145)

## 2013-07-13 LAB — MAGNESIUM: MAGNESIUM: 2 mg/dL (ref 1.5–2.5)

## 2013-07-23 ENCOUNTER — Other Ambulatory Visit: Payer: Self-pay | Admitting: Internal Medicine

## 2013-07-29 ENCOUNTER — Ambulatory Visit: Payer: BC Managed Care – HMO | Admitting: Cardiovascular Disease

## 2013-08-06 ENCOUNTER — Ambulatory Visit: Payer: Self-pay | Admitting: Physician Assistant

## 2013-08-21 ENCOUNTER — Encounter: Payer: Self-pay | Admitting: Physician Assistant

## 2013-08-21 ENCOUNTER — Ambulatory Visit (INDEPENDENT_AMBULATORY_CARE_PROVIDER_SITE_OTHER): Payer: 59 | Admitting: Physician Assistant

## 2013-08-21 VITALS — BP 110/70 | HR 60 | Temp 97.9°F | Resp 16 | Wt 187.0 lb

## 2013-08-21 DIAGNOSIS — R0600 Dyspnea, unspecified: Secondary | ICD-10-CM

## 2013-08-21 DIAGNOSIS — E876 Hypokalemia: Secondary | ICD-10-CM

## 2013-08-21 DIAGNOSIS — N179 Acute kidney failure, unspecified: Secondary | ICD-10-CM

## 2013-08-21 DIAGNOSIS — R0609 Other forms of dyspnea: Secondary | ICD-10-CM

## 2013-08-21 DIAGNOSIS — R0989 Other specified symptoms and signs involving the circulatory and respiratory systems: Secondary | ICD-10-CM

## 2013-08-21 LAB — BASIC METABOLIC PANEL WITH GFR
BUN: 22 mg/dL (ref 6–23)
CO2: 36 meq/L — AB (ref 19–32)
Calcium: 9.4 mg/dL (ref 8.4–10.5)
Chloride: 95 mEq/L — ABNORMAL LOW (ref 96–112)
Creat: 1.44 mg/dL — ABNORMAL HIGH (ref 0.50–1.10)
GFR, Est African American: 47 mL/min — ABNORMAL LOW
GFR, Est Non African American: 41 mL/min — ABNORMAL LOW
GLUCOSE: 64 mg/dL — AB (ref 70–99)
POTASSIUM: 3.2 meq/L — AB (ref 3.5–5.3)
SODIUM: 141 meq/L (ref 135–145)

## 2013-08-21 LAB — CBC WITH DIFFERENTIAL/PLATELET
Basophils Absolute: 0.1 10*3/uL (ref 0.0–0.1)
Basophils Relative: 1 % (ref 0–1)
Eosinophils Absolute: 0.3 10*3/uL (ref 0.0–0.7)
Eosinophils Relative: 4 % (ref 0–5)
HCT: 39.9 % (ref 36.0–46.0)
HEMOGLOBIN: 13.7 g/dL (ref 12.0–15.0)
LYMPHS ABS: 1.7 10*3/uL (ref 0.7–4.0)
LYMPHS PCT: 23 % (ref 12–46)
MCH: 30.6 pg (ref 26.0–34.0)
MCHC: 34.3 g/dL (ref 30.0–36.0)
MCV: 89.1 fL (ref 78.0–100.0)
MONOS PCT: 12 % (ref 3–12)
Monocytes Absolute: 0.9 10*3/uL (ref 0.1–1.0)
NEUTROS ABS: 4.4 10*3/uL (ref 1.7–7.7)
Neutrophils Relative %: 60 % (ref 43–77)
PLATELETS: 199 10*3/uL (ref 150–400)
RBC: 4.48 MIL/uL (ref 3.87–5.11)
RDW: 14.6 % (ref 11.5–15.5)
WBC: 7.3 10*3/uL (ref 4.0–10.5)

## 2013-08-21 LAB — HEPATIC FUNCTION PANEL
ALT: 11 U/L (ref 0–35)
AST: 20 U/L (ref 0–37)
Albumin: 4.1 g/dL (ref 3.5–5.2)
Alkaline Phosphatase: 59 U/L (ref 39–117)
BILIRUBIN DIRECT: 0.1 mg/dL (ref 0.0–0.3)
Indirect Bilirubin: 0.3 mg/dL (ref 0.2–1.2)
Total Bilirubin: 0.4 mg/dL (ref 0.2–1.2)
Total Protein: 6.5 g/dL (ref 6.0–8.3)

## 2013-08-21 LAB — MAGNESIUM: Magnesium: 1.8 mg/dL (ref 1.5–2.5)

## 2013-08-21 NOTE — Patient Instructions (Signed)
Edema Edema is an abnormal buildup of fluids in your bodytissues. Edema is somewhatdependent on gravity to pull the fluid to the lowest place in your body. That makes the condition more common in the legs and thighs (lower extremities). Painless swelling of the feet and ankles is common and becomes more likely as you get older. It is also common in looser tissues, like around your eyes.  When the affected area is squeezed, the fluid may move out of that spot and leave a dent for a few moments. This dent is called pitting.  CAUSES  There are many possible causes of edema. Eating too much salt and being on your feet or sitting for a long time can cause edema in your legs and ankles. Hot weather may make edema worse. Common medical causes of edema include:  Heart failure.  Liver disease.  Kidney disease.  Weak blood vessels in your legs.  Cancer.  An injury.  Pregnancy.  Some medications.  Obesity. SYMPTOMS  Edema is usually painless.Your skin may look swollen or shiny.  DIAGNOSIS  Your health care Olivia Walsh may be able to diagnose edema by asking about your medical history and doing a physical exam. You may need to have tests such as X-rays, an electrocardiogram, or blood tests to check for medical conditions that may cause edema.  TREATMENT  Edema treatment depends on the cause. If you have heart, liver, or kidney disease, you need the treatment appropriate for these conditions. General treatment may include:  Elevation of the affected body part above the level of your heart.  Compression of the affected body part. Pressure from elastic bandages or support stockings squeezes the tissues and forces fluid back into the blood vessels. This keeps fluid from entering the tissues.  Restriction of fluid and salt intake.  Use of a water pill (diuretic). These medications are appropriate only for some types of edema. They pull fluid out of your body and make you urinate more often. This  gets rid of fluid and reduces swelling, but diuretics can have side effects. Only use diuretics as directed by your health care Olivia Walsh. HOME CARE INSTRUCTIONS   Keep the affected body part above the level of your heart when you are lying down.   Do not sit still or stand for prolonged periods.   Do not put anything directly under your knees when lying down.  Do not wear constricting clothing or garters on your upper legs.   Exercise your legs to work the fluid back into your blood vessels. This may help the swelling go down.   Wear elastic bandages or support stockings to reduce ankle swelling as directed by your health care Olivia Walsh.   Eat a low-salt diet to reduce fluid if your health care Olivia Walsh recommends it.   Only take medicines as directed by your health care Olivia Walsh. SEEK MEDICAL CARE IF:   Your edema is not responding to treatment.  You have heart, liver, or kidney disease and notice symptoms of edema.  You have edema in your legs that does not improve after elevating them.   You have sudden and unexplained weight gain. SEEK IMMEDIATE MEDICAL CARE IF:   You develop shortness of breath or chest pain.   You cannot breathe when you lie down.  You develop pain, redness, or warmth in the swollen areas.   You have heart, liver, or kidney disease and suddenly get edema.  You have a fever and your symptoms suddenly get worse. MAKE SURE YOU:     Understand these instructions.  Will watch your condition.  Will get help right away if you are not doing well or get worse. Document Released: 02/12/2005 Document Revised: 02/17/2013 Document Reviewed: 12/05/2012 [employer] Patient Information 2015 [employer]. This information is not intended to replace advice given to you by your health care Olivia Walsh. Make sure you discuss any questions you have with your health care Olivia Walsh.  

## 2013-08-21 NOTE — Progress Notes (Signed)
   Subjective:    Patient ID: Olivia Walsh, female    DOB: 27-May-1960, 53 y.o.   MRN: [id number]  HPI 53 y.o. female with history of hypothyroidism, CKD, HTN, hyperlipidemia, smoker, CABG presents for 1 month follow up. She was found to have ARF, hypokalemia, metabolic alkalosis and respiratory acidosis she did not want to go to the ER so we decreased her lasix to 1 a day (40mg ) from twice a day, she is having minor swelling, she is on potassium 20 meq once daily with magnesium. CXR negative. She has increased her water. Her Kidney function has improved, during this entire time she has not had any symptoms. Denies CP, SOB, increasing fatigue, muscle weakness/cramping. Has follow up with Cardio In July.    Review of Systems  Constitutional: Positive for fatigue. Negative for fever, chills, diaphoresis, activity change, appetite change and unexpected weight change.  HENT: Negative.   Respiratory: Positive for cough and shortness of breath. Negative for apnea, choking, chest tightness, wheezing and stridor.   Cardiovascular: Positive for leg swelling. Negative for chest pain and palpitations.  Gastrointestinal: Negative.   Genitourinary: Negative.   Musculoskeletal: Negative.   Neurological: Negative.        Objective:   Physical Exam General Appearance: Well nourished, in no apparent distress. Eyes: PERRLA, EOMs, conjunctiva no swelling or erythema Sinuses: No Frontal/maxillary tenderness ENT/Mouth: Ext aud canals clear, TMs without erythema, bulging. No erythema, swelling, or exudate on post pharynx.  Tonsils not swollen or erythematous. Hearing normal.  Neck: Supple, thyroid normal.  Respiratory: Respiratory effort normal, BS equal bilaterally without rales, rhonchi, wheezing or stridor.  Cardio: RRR with 3/6 holosystolic murmur. Decreased bilateral pulses with 2+ edema L>R.   Abdomen: Soft, + BS.  Non tender, no guarding, rebound, hernias, masses. Lymphatics: Non tender without  lymphadenopathy.  Musculoskeletal: Full ROM, 5/5 strength, normal gait.  Skin: Warm, dry without rashes, lesions, ecchymosis.  Neuro: Cranial nerves intact. Normal muscle tone, no cerebellar symptoms. Sensation intact.  Psych: Awake and oriented X 3, normal affect, Insight and Judgment appropriate     Assessment & Plan:  Edema- check BNP, recheck BMP, add compression stockings.  Follow up with cardio

## 2013-08-22 LAB — BRAIN NATRIURETIC PEPTIDE: Brain Natriuretic Peptide: 58.4 pg/mL (ref 0.0–100.0)

## 2013-09-07 ENCOUNTER — Encounter: Payer: Self-pay | Admitting: Cardiovascular Disease

## 2013-09-07 ENCOUNTER — Encounter: Payer: Self-pay | Admitting: Internal Medicine

## 2013-09-07 ENCOUNTER — Ambulatory Visit (INDEPENDENT_AMBULATORY_CARE_PROVIDER_SITE_OTHER): Payer: 59 | Admitting: Cardiovascular Disease

## 2013-09-07 VITALS — BP 108/70 | HR 61 | Ht 68.0 in | Wt 189.2 lb

## 2013-09-07 DIAGNOSIS — R011 Cardiac murmur, unspecified: Secondary | ICD-10-CM

## 2013-09-07 DIAGNOSIS — E785 Hyperlipidemia, unspecified: Secondary | ICD-10-CM

## 2013-09-07 DIAGNOSIS — I10 Essential (primary) hypertension: Secondary | ICD-10-CM

## 2013-09-07 DIAGNOSIS — I2510 Atherosclerotic heart disease of native coronary artery without angina pectoris: Secondary | ICD-10-CM

## 2013-09-07 NOTE — Patient Instructions (Signed)
Your physician has requested that you have an echocardiogram. Echocardiography is a painless test that uses sound waves to create images of your heart. It provides your doctor with information about the size and shape of your heart and how well your heart's chambers and valves are working. This procedure takes approximately one hour. There are no restrictions for this procedure.  Your physician recommends that you continue on your current medications as directed. Please refer to the Current Medication list given to you today.  Your physician wants you to follow-up in: 1 year with Dr. Acie Fredrickson.  You will receive a reminder letter in the mail two months in advance. If you don't receive a letter, please call our office to schedule the follow-up appointment.

## 2013-09-07 NOTE — Assessment & Plan Note (Signed)
Olivia Walsh presents today for followup.  She has a  history of coronary artery disease and has not seen anyone in many years. She is to see Dr. Johnsie Cancel.  She is not having any specific complaints but her medical doctor heard a heart murmur but that she should come back by.  She's having some mild leg swelling but that's probably related to her her inactivity and the fact she's had saphenous vein harvest from that leg.  We'll get an echocardiogram for further evaluation of her heart murmur and leg swelling.  I'll see her again in one year for office visit and fasting labs.

## 2013-09-07 NOTE — Progress Notes (Signed)
Olivia Walsh Date of Birth  05-30-1960       [facility]    [employer] [address], [address], [place], [place]  [address]   [place], [place]  [address] [place]   Fax  [phone]     Fax [phone]  Problem List: 1. Coronary artery disease-status post coronary artery bypass grafting in 2000 2. Hyperlipidemia 3. Left drop foot.   History of Present Illness:  Olivia Walsh is a 53 yo who I met approximately 15 years ago.  She has seen Dr. Johnsie Cancel 3-4 years ago. She does not have any specific complaints - her medical doctor told her that the needed to come in to be seen.  Still smokes - does not want to quit.   1 ppd.    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 Cholecalciferol (VITAMIN D PO) Take 50,000 Int'l Units by mouth 3 (three) times a week.      . clopidogrel (PLAVIX) 75 MG tablet TAKE 1 TABLET TWICE A DAY TO PREVENT STROKE  180 tablet  0  . CRESTOR 20 MG tablet TAKE 1 TABLET DAILY FOR CHOLESTEROL  90 tablet  1  . furosemide (LASIX) 40 MG tablet Take 40 mg by mouth.      . labetalol (NORMODYNE) 300 MG tablet Take 1 tablet (300 mg total) by mouth 2 (two) times daily.  180 tablet  1  . levETIRAcetam (KEPPRA) 500 MG tablet TAKE 1 TABLET TWICE A DAY FOR SEIZURES  180 tablet  1  . levothyroxine (SYNTHROID, LEVOTHROID) 75 MCG tablet TAKE 1 TABLET DAILY  90 tablet  0  . MAGNESIUM PO Take by mouth daily.       No current facility-administered medications on file prior to visit.    Allergies  Allergen Reactions  . Ace Inhibitors Cough  . Penicillins   . Prednisone     Dysphoria    Past Medical History  Diagnosis Date  . Vitamin D deficiency     Past Surgical History  Procedure Laterality Date  .  Coronary artery bypass graft  2002    Dr Cyndia Bent  . Embolization  2005    Embolization of right PICA aneurysm    History  Smoking status  . Current Every Day Smoker -- 1.00 packs/day  . Types: Cigarettes  Smokeless tobacco  . Never Used    History  Alcohol Use No    Family History  Problem Relation Age of Onset  . Heart disease Mother   . Cancer Father     brain  . Leukemia Sister   . Heart disease Brother     Reviw of Systems:  Reviewed in the HPI.  All other systems are negative.  Physical Exam: Blood pressure 108/70, pulse 61, height _0  (1.727 m), weight 189 lb 3.2 oz (85.821 kg). Wt Readings from Last 3 Encounters:  09/07/13 189 lb 3.2 oz (85.821 kg)  08/21/13 187 lb (84.823 kg)  07/02/13 181 lb (82.101 kg)     General: Well developed, well nourished, in no acute distress.  Head: Normocephalic, atraumatic, sclera non-icteric, mucus membranes are moist,   Neck: Supple. Carotids are 2 + without bruits. No JVD  Lungs: Clear   Heart: RR, 2/6 systolic murmur , pulses are 2+   Abdomen: Soft, non-tender, non-distended with normal bowel sounds.  Msk:  Strength and tone are normal   Extremities: No clubbing or cyanosis. She has 1+ edema in her left leg.    Distal pedal pulses are 2+ and equal   Neuro: CN II - XII intact.  Alert and oriented X 3.   Psych:  Normal   ECG: September 07, 2013:   NSR at 43.  LVH with QRS widening and repol abn.   Assessment / Plan:

## 2013-09-07 NOTE — Assessment & Plan Note (Signed)
Continue current meds.  BP is well controlled

## 2013-09-07 NOTE — Assessment & Plan Note (Signed)
Continue atorvastatin

## 2013-09-17 ENCOUNTER — Ambulatory Visit (HOSPITAL_COMMUNITY): Payer: 59 | Attending: Cardiology | Admitting: Radiology

## 2013-09-17 DIAGNOSIS — I079 Rheumatic tricuspid valve disease, unspecified: Secondary | ICD-10-CM | POA: Insufficient documentation

## 2013-09-17 DIAGNOSIS — F17200 Nicotine dependence, unspecified, uncomplicated: Secondary | ICD-10-CM | POA: Insufficient documentation

## 2013-09-17 DIAGNOSIS — I059 Rheumatic mitral valve disease, unspecified: Secondary | ICD-10-CM | POA: Insufficient documentation

## 2013-09-17 DIAGNOSIS — E785 Hyperlipidemia, unspecified: Secondary | ICD-10-CM | POA: Insufficient documentation

## 2013-09-17 DIAGNOSIS — I517 Cardiomegaly: Secondary | ICD-10-CM | POA: Insufficient documentation

## 2013-09-17 DIAGNOSIS — I2510 Atherosclerotic heart disease of native coronary artery without angina pectoris: Secondary | ICD-10-CM | POA: Insufficient documentation

## 2013-09-17 DIAGNOSIS — R609 Edema, unspecified: Secondary | ICD-10-CM | POA: Insufficient documentation

## 2013-09-17 DIAGNOSIS — R011 Cardiac murmur, unspecified: Secondary | ICD-10-CM | POA: Insufficient documentation

## 2013-09-17 NOTE — Progress Notes (Signed)
Echocardiogram performed.  

## 2013-09-26 ENCOUNTER — Other Ambulatory Visit: Payer: Self-pay | Admitting: Physician Assistant

## 2013-10-09 ENCOUNTER — Encounter: Payer: Self-pay | Admitting: Internal Medicine

## 2013-10-09 ENCOUNTER — Ambulatory Visit (INDEPENDENT_AMBULATORY_CARE_PROVIDER_SITE_OTHER): Payer: 59 | Admitting: Internal Medicine

## 2013-10-09 ENCOUNTER — Other Ambulatory Visit: Payer: Self-pay | Admitting: Internal Medicine

## 2013-10-09 VITALS — BP 102/74 | HR 64 | Temp 97.7°F | Resp 16 | Ht 68.0 in | Wt 191.2 lb

## 2013-10-09 DIAGNOSIS — R7401 Elevation of levels of liver transaminase levels: Secondary | ICD-10-CM

## 2013-10-09 DIAGNOSIS — E559 Vitamin D deficiency, unspecified: Secondary | ICD-10-CM

## 2013-10-09 DIAGNOSIS — R740 Nonspecific elevation of levels of transaminase and lactic acid dehydrogenase [LDH]: Secondary | ICD-10-CM

## 2013-10-09 DIAGNOSIS — N6019 Diffuse cystic mastopathy of unspecified breast: Secondary | ICD-10-CM

## 2013-10-09 DIAGNOSIS — Z9119 Patient's noncompliance with other medical treatment and regimen: Secondary | ICD-10-CM

## 2013-10-09 DIAGNOSIS — Z113 Encounter for screening for infections with a predominantly sexual mode of transmission: Secondary | ICD-10-CM

## 2013-10-09 DIAGNOSIS — F19988 Other psychoactive substance use, unspecified with other psychoactive substance-induced disorder: Secondary | ICD-10-CM

## 2013-10-09 DIAGNOSIS — Z1212 Encounter for screening for malignant neoplasm of rectum: Secondary | ICD-10-CM

## 2013-10-09 DIAGNOSIS — Z111 Encounter for screening for respiratory tuberculosis: Secondary | ICD-10-CM

## 2013-10-09 DIAGNOSIS — Z91199 Patient's noncompliance with other medical treatment and regimen due to unspecified reason: Secondary | ICD-10-CM

## 2013-10-09 DIAGNOSIS — Z0000 Encounter for general adult medical examination without abnormal findings: Secondary | ICD-10-CM

## 2013-10-09 DIAGNOSIS — F17218 Nicotine dependence, cigarettes, with other nicotine-induced disorders: Secondary | ICD-10-CM

## 2013-10-09 DIAGNOSIS — I10 Essential (primary) hypertension: Secondary | ICD-10-CM

## 2013-10-09 LAB — CBC WITH DIFFERENTIAL/PLATELET
BASOS ABS: 0 10*3/uL (ref 0.0–0.1)
Basophils Relative: 0 % (ref 0–1)
Eosinophils Absolute: 0.2 10*3/uL (ref 0.0–0.7)
Eosinophils Relative: 3 % (ref 0–5)
HCT: 41.1 % (ref 36.0–46.0)
Hemoglobin: 13.8 g/dL (ref 12.0–15.0)
LYMPHS PCT: 23 % (ref 12–46)
Lymphs Abs: 1.5 10*3/uL (ref 0.7–4.0)
MCH: 30.9 pg (ref 26.0–34.0)
MCHC: 33.6 g/dL (ref 30.0–36.0)
MCV: 91.9 fL (ref 78.0–100.0)
Monocytes Absolute: 0.5 10*3/uL (ref 0.1–1.0)
Monocytes Relative: 8 % (ref 3–12)
NEUTROS ABS: 4.4 10*3/uL (ref 1.7–7.7)
Neutrophils Relative %: 66 % (ref 43–77)
Platelets: 202 10*3/uL (ref 150–400)
RBC: 4.47 MIL/uL (ref 3.87–5.11)
RDW: 14.3 % (ref 11.5–15.5)
WBC: 6.7 10*3/uL (ref 4.0–10.5)

## 2013-10-09 LAB — HEMOGLOBIN A1C
Hgb A1c MFr Bld: 5.6 % (ref <5.7)
MEAN PLASMA GLUCOSE: 114 mg/dL (ref <117)

## 2013-10-09 NOTE — Progress Notes (Signed)
Patient ID: Olivia Walsh, female   DOB: May 30, 1960, 53 y.o.   MRN: [id number]   Annual Screening Comprehensive Examination  This very nice 53 y.o.MWF presents for complete physical.  Patient has been followed for HTN, ASCAD s/p CABG,  Prediabetes, Hyperlipidemia, and Vitamin D Deficiency.    HTN predates since 7 at age 63 . Patient's BP has been controlled at home. Patient underwent CABG at age 89 in 2002 and patient denies any cardiac symptoms as chest pain, palpitations, shortness of breath, dizziness or ankle swelling. Today's BP: 102/74 mmHg. Patient also has ASPVD & claudication followed by Dr Kellie Simmering. In 2005 patient had a SAH from a cerebral aneurysm and had PTCA and also had coiling of cerebral aneurysms in 2008 and 2012.   Patient's hyperlipidemia is controlled with diet and medications. Patient denies myalgias or other medication SE's. Last lipids were  Cholesterol 126; HDL  38*; LDL  54; Triglycerides 170 on 07/02/2013.   Patient ihas prediabetes  and patient denies reactive hypoglycemic symptoms, visual blurring, diabetic polys, or paresthesias. Last A1c was 6.1%  on  07/02/2013.   Finally, patient has history of Vitamin D Deficiency of 39 in 2008 and last Vitamin D was 107 on 07/02/2013.   Medication Sig  . VITAMIN D  Take 50,000 Int'l Units by mouth 3  times a week.  . clopidogrel  75 MG tablet TAKE 1 TABTWICE A DAY TO PREVENT STROKE  . CRESTOR 20 MG tablet TAKE 1 TAB DAILY FOR CHOLESTEROL  . furosemide  40 MG tablet Take 1 TAB DAILY  . labetalol 300 MG tablet Take 1 tablet  2  times daily.  Marland Kitchen levETIRAcetam (KEPPRA) 500 MG tablet TAKE 1 TABLET TWICE A DAY FOR SEIZURES  . levothyroxine (SYNTHROID, LEVOTHROID) 75 MCG tablet TAKE 1 TABLET DAILY  . MAGNESIUM PO Take by mouth daily.  . potassium chloride SA (K-DUR,KLOR-CON) 20 MEQ tablet Take 20 mEq by mouth daily.   Allergies  Allergen Reactions  . Ace Inhibitors Cough  . Penicillins   . Prednisone     Dysphoria   Past Medical  History  Diagnosis Date  . Vitamin D deficiency    Past Surgical History  Procedure Laterality Date  . Coronary artery bypass graft  2002    Dr Cyndia Bent  . Embolization  2005    Embolization of right PICA aneurysm   Family History  Problem Relation Age of Onset  . Heart disease Mother   . Cancer Father     brain  . Leukemia Sister   . Heart disease Brother    History  Substance Use Topics  . Smoking status: Current Every Day Smoker -- 1.00 packs/day    Types: Cigarettes  . Smokeless tobacco: Never Used  . Alcohol Use: No    ROS Constitutional: Denies fever, chills, weight loss/gain, headaches, insomnia, fatigue, night sweats, and change in appetite. Eyes: Denies redness, blurred vision, diplopia, discharge, itchy, watery eyes.  ENT: Denies discharge, congestion, post nasal drip, epistaxis, sore throat, earache, hearing loss, dental pain, Tinnitus, Vertigo, Sinus pain, snoring.  Cardio: Denies chest pain, palpitations, irregular heartbeat, syncope, dyspnea, diaphoresis, orthopnea, PND, claudication, edema Respiratory: denies cough, dyspnea, DOE, pleurisy, hoarseness, laryngitis, wheezing.  Gastrointestinal: Denies dysphagia, heartburn, reflux, water brash, pain, cramps, nausea, vomiting, bloating, diarrhea, constipation, hematemesis, melena, hematochezia, jaundice, hemorrhoids Genitourinary: Denies dysuria, frequency, urgency, nocturia, hesitancy, discharge, hematuria, flank pain Breast: Breast lumps, nipple discharge, bleeding.  Musculoskeletal: Denies arthralgia, myalgia, stiffness, Jt. Swelling, pain, limp, and strain/sprain. Denies  falls. Skin: Denies puritis, rash, hives, warts, acne, eczema, changing in skin lesion Neuro: No weakness, tremor, incoordination, spasms, paresthesia, pain Psychiatric: Denies confusion, memory loss, sensory loss. Denies Depression. Endocrine: Denies change in weight, skin, hair change, nocturia, and paresthesia, diabetic polys, visual blurring,  hyper / hypo glycemic episodes.  Heme/Lymph: No excessive bleeding, bruising, enlarged lymph nodes.  Physical Exam  BP 102/74  Pulse 64  Temp(Src) 97.7 F (36.5 C) (Temporal)  Resp 16  Ht 5\' 8"  (1.727 m)  Wt 191 lb 3.2 oz (86.728 kg)  BMI 29.08 kg/m2  General Appearance: Well nourished and in no apparent distress.Prominant Tobacco halitosis. Eyes: PERRLA, EOMs, conjunctiva no swelling or erythema, normal fundi and vessels. Sinuses: No frontal/maxillary tenderness ENT/Mouth: EACs patent / TMs  nl. Nares clear without erythema, swelling, mucoid exudates. Oral hygiene is poor with numerous caries. . No erythema, swelling, or exudate. Tongue normal, non-obstructing. Tonsils not swollen or erythematous. Hearing normal.  Neck: Supple, thyroid normal. No bruits, nodes or JVD. Respiratory: Respiratory effort normal.  BS equal and clear bilateral without rales, rhonci, wheezing or stridor. Cardio: Heart sounds are normal with regular rate and rhythm and no murmurs, rubs or gallops. Peripheral pulses are normal and equal bilaterally without edema. No aortic or femoral bruits. Chest: Median Sternotomy scar. symmetric with normal excursions and percussion. Breasts: Symmetric, without lumps, nipple discharge, retractions, or fibrocystic changes.  Abdomen: Flat, soft, with bowl sounds. Nontender, no guarding, rebound, hernias, masses, or organomegaly.  Lymphatics: Non tender without lymphadenopathy.  Genitourinary:  Musculoskeletal: Full ROM all peripheral extremities, joint stability, 5/5 strength, and normal gait. Skin: Warm and dry without rashes, lesions, cyanosis, clubbing or  ecchymosis.  Neuro: Cranial nerves intact, reflexes equal bilaterally. Normal muscle tone, no cerebellar symptoms. Sensation intact.  Pysch: Awake and oriented X 3, normal affect, Insight and Judgment appropriate.   Assessment and Plan  1. Annual Screening Examination 2. Hypertension  3. Hyperlipidemia 4. Pre  Diabetes 5. Vitamin D Deficiency 6. ASCAD s/p CABG 7. ASCVD s/p CVA fr SAH/ cerebral aneurysms 8. ASPVD/Claudication 9. Seizure Disorder 10. Nicotine Addiction 11. Poor Compliance   Continue prudent diet as discussed, weight control, BP monitoring, regular exercise, and medications. Discussed med's effects and SE's. Screening labs and tests as requested with regular follow-up as recommended. Long discussion re: importance of smoking cessation. Patient admits her addiction and is not willing to consider smoking cessation.

## 2013-10-09 NOTE — Patient Instructions (Signed)

## 2013-10-10 LAB — HEPATIC FUNCTION PANEL
ALT: 13 U/L (ref 0–35)
AST: 20 U/L (ref 0–37)
Albumin: 4 g/dL (ref 3.5–5.2)
Alkaline Phosphatase: 51 U/L (ref 39–117)
BILIRUBIN TOTAL: 0.3 mg/dL (ref 0.2–1.2)
TOTAL PROTEIN: 6.4 g/dL (ref 6.0–8.3)

## 2013-10-10 LAB — BASIC METABOLIC PANEL WITH GFR
BUN: 14 mg/dL (ref 6–23)
CO2: 26 mEq/L (ref 19–32)
Calcium: 9 mg/dL (ref 8.4–10.5)
Chloride: 101 mEq/L (ref 96–112)
Creat: 1.36 mg/dL — ABNORMAL HIGH (ref 0.50–1.10)
GFR, EST AFRICAN AMERICAN: 51 mL/min — AB
GFR, EST NON AFRICAN AMERICAN: 44 mL/min — AB
Glucose, Bld: 101 mg/dL — ABNORMAL HIGH (ref 70–99)
POTASSIUM: 3.4 meq/L — AB (ref 3.5–5.3)
SODIUM: 143 meq/L (ref 135–145)

## 2013-10-10 LAB — URINALYSIS, MICROSCOPIC ONLY
BACTERIA UA: NONE SEEN
Casts: NONE SEEN
Crystals: NONE SEEN
Squamous Epithelial / LPF: NONE SEEN

## 2013-10-10 LAB — VITAMIN D 25 HYDROXY (VIT D DEFICIENCY, FRACTURES): Vit D, 25-Hydroxy: 97 ng/mL — ABNORMAL HIGH (ref 30–89)

## 2013-10-10 LAB — HEPATITIS B CORE ANTIBODY, TOTAL: Hep B Core Total Ab: NONREACTIVE

## 2013-10-10 LAB — LIPID PANEL
CHOLESTEROL: 141 mg/dL (ref 0–200)
HDL: 40 mg/dL (ref >39)
LDL Cholesterol: 75 mg/dL (ref 0–99)
Total CHOL/HDL Ratio: 3.5 Ratio
Triglycerides: 128 mg/dL (ref <150)
VLDL: 26 mg/dL (ref 0–40)

## 2013-10-10 LAB — HEPATITIS C ANTIBODY: HCV Ab: NEGATIVE

## 2013-10-10 LAB — HIV ANTIBODY (ROUTINE TESTING W REFLEX): HIV: NONREACTIVE

## 2013-10-10 LAB — MICROALBUMIN / CREATININE URINE RATIO
Creatinine, Urine: 23.9 mg/dL
MICROALB/CREAT RATIO: 362.8 mg/g — AB (ref 0.0–30.0)
Microalb, Ur: 8.67 mg/dL — ABNORMAL HIGH (ref 0.00–1.89)

## 2013-10-10 LAB — VITAMIN B12: Vitamin B-12: 279 pg/mL (ref 211–911)

## 2013-10-10 LAB — TSH: TSH: 0.982 u[IU]/mL (ref 0.350–4.500)

## 2013-10-10 LAB — INSULIN, FASTING: Insulin fasting, serum: 35 u[IU]/mL — ABNORMAL HIGH (ref 3–28)

## 2013-10-10 LAB — HEPATITIS B SURFACE ANTIBODY,QUALITATIVE: Hep B S Ab: NEGATIVE

## 2013-10-10 LAB — RPR

## 2013-10-10 LAB — HEPATITIS A ANTIBODY, TOTAL: Hep A Total Ab: NONREACTIVE

## 2013-10-10 LAB — MAGNESIUM: Magnesium: 1.7 mg/dL (ref 1.5–2.5)

## 2013-10-11 ENCOUNTER — Other Ambulatory Visit: Payer: Self-pay | Admitting: Physician Assistant

## 2013-10-12 ENCOUNTER — Other Ambulatory Visit: Payer: Self-pay | Admitting: Internal Medicine

## 2013-10-12 LAB — HEPATITIS B E ANTIBODY: Hepatitis Be Antibody: NONREACTIVE

## 2013-10-12 LAB — TB SKIN TEST
Induration: 0 mm
TB SKIN TEST: NEGATIVE

## 2013-11-14 ENCOUNTER — Other Ambulatory Visit: Payer: Self-pay | Admitting: Internal Medicine

## 2013-12-06 ENCOUNTER — Other Ambulatory Visit: Payer: Self-pay | Admitting: Physician Assistant

## 2013-12-07 ENCOUNTER — Other Ambulatory Visit: Payer: Self-pay | Admitting: *Deleted

## 2013-12-07 MED ORDER — LEVETIRACETAM 500 MG PO TABS: ORAL_TABLET | ORAL | Status: DC

## 2013-12-27 ENCOUNTER — Other Ambulatory Visit: Payer: Self-pay | Admitting: Emergency Medicine

## 2014-01-10 ENCOUNTER — Other Ambulatory Visit: Payer: Self-pay | Admitting: Physician Assistant

## 2014-01-29 ENCOUNTER — Ambulatory Visit: Payer: Self-pay | Admitting: Physician Assistant

## 2014-02-25 ENCOUNTER — Other Ambulatory Visit: Payer: Self-pay | Admitting: Internal Medicine

## 2014-03-02 ENCOUNTER — Other Ambulatory Visit: Payer: Self-pay | Admitting: Physician Assistant

## 2014-03-16 ENCOUNTER — Ambulatory Visit (INDEPENDENT_AMBULATORY_CARE_PROVIDER_SITE_OTHER): Payer: 59 | Admitting: Physician Assistant

## 2014-03-16 ENCOUNTER — Encounter: Payer: Self-pay | Admitting: Physician Assistant

## 2014-03-16 VITALS — BP 140/78 | HR 60 | Temp 98.0°F | Resp 16 | Ht 68.0 in | Wt 196.0 lb

## 2014-03-16 DIAGNOSIS — E039 Hypothyroidism, unspecified: Secondary | ICD-10-CM

## 2014-03-16 DIAGNOSIS — N183 Chronic kidney disease, stage 3 unspecified: Secondary | ICD-10-CM

## 2014-03-16 DIAGNOSIS — R7303 Prediabetes: Secondary | ICD-10-CM

## 2014-03-16 DIAGNOSIS — F17200 Nicotine dependence, unspecified, uncomplicated: Secondary | ICD-10-CM

## 2014-03-16 DIAGNOSIS — E559 Vitamin D deficiency, unspecified: Secondary | ICD-10-CM

## 2014-03-16 DIAGNOSIS — R7309 Other abnormal glucose: Secondary | ICD-10-CM

## 2014-03-16 DIAGNOSIS — I10 Essential (primary) hypertension: Secondary | ICD-10-CM

## 2014-03-16 DIAGNOSIS — Z79899 Other long term (current) drug therapy: Secondary | ICD-10-CM

## 2014-03-16 DIAGNOSIS — Z87898 Personal history of other specified conditions: Secondary | ICD-10-CM

## 2014-03-16 DIAGNOSIS — I6350 Cerebral infarction due to unspecified occlusion or stenosis of unspecified cerebral artery: Secondary | ICD-10-CM

## 2014-03-16 DIAGNOSIS — E782 Mixed hyperlipidemia: Secondary | ICD-10-CM

## 2014-03-16 LAB — CBC WITH DIFFERENTIAL/PLATELET
BASOS ABS: 0.1 10*3/uL (ref 0.0–0.1)
Basophils Relative: 1 % (ref 0–1)
EOS PCT: 3 % (ref 0–5)
Eosinophils Absolute: 0.2 10*3/uL (ref 0.0–0.7)
HCT: 41.4 % (ref 36.0–46.0)
Hemoglobin: 14.1 g/dL (ref 12.0–15.0)
Lymphocytes Relative: 23 % (ref 12–46)
Lymphs Abs: 1.5 10*3/uL (ref 0.7–4.0)
MCH: 31.1 pg (ref 26.0–34.0)
MCHC: 34.1 g/dL (ref 30.0–36.0)
MCV: 91.4 fL (ref 78.0–100.0)
MPV: 10 fL (ref 8.6–12.4)
Monocytes Absolute: 0.7 10*3/uL (ref 0.1–1.0)
Monocytes Relative: 11 % (ref 3–12)
NEUTROS ABS: 4 10*3/uL (ref 1.7–7.7)
Neutrophils Relative %: 62 % (ref 43–77)
Platelets: 191 10*3/uL (ref 150–400)
RBC: 4.53 MIL/uL (ref 3.87–5.11)
RDW: 14.3 % (ref 11.5–15.5)
WBC: 6.4 10*3/uL (ref 4.0–10.5)

## 2014-03-16 LAB — HEPATIC FUNCTION PANEL
ALT: 10 U/L (ref 0–35)
AST: 16 U/L (ref 0–37)
Albumin: 4 g/dL (ref 3.5–5.2)
Alkaline Phosphatase: 56 U/L (ref 39–117)
Bilirubin, Direct: 0.1 mg/dL (ref 0.0–0.3)
Indirect Bilirubin: 0.3 mg/dL (ref 0.2–1.2)
Total Bilirubin: 0.4 mg/dL (ref 0.2–1.2)
Total Protein: 6.4 g/dL (ref 6.0–8.3)

## 2014-03-16 LAB — BASIC METABOLIC PANEL WITH GFR
BUN: 19 mg/dL (ref 6–23)
CALCIUM: 9.3 mg/dL (ref 8.4–10.5)
CO2: 32 mEq/L (ref 19–32)
Chloride: 102 mEq/L (ref 96–112)
Creat: 1.42 mg/dL — ABNORMAL HIGH (ref 0.50–1.10)
GFR, Est African American: 48 mL/min — ABNORMAL LOW
GFR, Est Non African American: 42 mL/min — ABNORMAL LOW
Glucose, Bld: 64 mg/dL — ABNORMAL LOW (ref 70–99)
POTASSIUM: 3.5 meq/L (ref 3.5–5.3)
SODIUM: 143 meq/L (ref 135–145)

## 2014-03-16 LAB — LIPID PANEL
Cholesterol: 144 mg/dL (ref 0–200)
HDL: 38 mg/dL — ABNORMAL LOW (ref >39)
LDL Cholesterol: 86 mg/dL (ref 0–99)
Total CHOL/HDL Ratio: 3.8 Ratio
Triglycerides: 100 mg/dL (ref <150)
VLDL: 20 mg/dL (ref 0–40)

## 2014-03-16 LAB — HEMOGLOBIN A1C
Hgb A1c MFr Bld: 5.6 % (ref <5.7)
MEAN PLASMA GLUCOSE: 114 mg/dL (ref <117)

## 2014-03-16 LAB — TSH: TSH: 0.829 u[IU]/mL (ref 0.350–4.500)

## 2014-03-16 MED ORDER — CLOPIDOGREL BISULFATE 75 MG PO TABS: ORAL_TABLET | ORAL | Status: DC

## 2014-03-16 MED ORDER — LABETALOL HCL 300 MG PO TABS: 300.0000 mg | ORAL_TABLET | Freq: Two times a day (BID) | ORAL | Status: DC

## 2014-03-16 MED ORDER — LEVETIRACETAM 500 MG PO TABS: ORAL_TABLET | ORAL | Status: DC

## 2014-03-16 MED ORDER — FUROSEMIDE 40 MG PO TABS: ORAL_TABLET | ORAL | Status: DC

## 2014-03-16 MED ORDER — ROSUVASTATIN CALCIUM 20 MG PO TABS: 20.0000 mg | ORAL_TABLET | Freq: Every day | ORAL | Status: DC

## 2014-03-16 MED ORDER — LEVOTHYROXINE SODIUM 75 MCG PO TABS: 75.0000 ug | ORAL_TABLET | Freq: Every day | ORAL | Status: DC

## 2014-03-16 NOTE — Patient Instructions (Addendum)
-  continue medications as prescribed.  Please follow up in April 2016 for follow up and physical in August 2016.  GIVE PT FOOD CHOICE LISTS FOR MEAL PLANNING  1)The amount of food you eat is important  -Too much can increase glucose levels and cause you to gain weight (which can  also increase glucose levels.  -Too little can decrease glucose level to unsafe levels (<70) 2)Eat meals and snacks at the same time each day to help your diabetes medication help you.  If you eat at different times each day, then the medication will not be as effective. 3)Do NOT skip meals or eat meals later than usual.  If you skip meals, then your glucose level can go low (<70).  Eating meals later than usual will not help the medication work effectively. 4) Amount of carbohydrates (carbs) per meal  -Breakfast- 30-45 grams of carbs  -Lunch and Dinner- 45-60 grams of carbs  -Snacks- 15-30 grams of carbs 5)Low fat foods have no more than 3 grams of fat per serving.  -Saturated- look for less than 1 gram per serving  -Trans Fat- look for 0 grams per serving 6)Exercise at least 120 minutes per week  Exercise Benefits:   -Lower LDL (Bad cholesterol)   -Lower blood pressure   -Increase HDL (Good cholesterol)   -Strengthen heart, lungs and muscles   -Burn calories and relieve stress   -Sleep better and help you feel better overall  To lower your risk of heart disease, limit your intake of saturated fat and trans fat as much as possible. THE GOOD WHAT IT DOES WHERE IT'S FOUND  MONOUNSATURATED FAT Lowers LDL and maybe raises HDL cholesterol Canola oil, olives, olive oil, peanuts, peanut oil, avocados, nuts  POLYUNSATURATED FAT Lowers LDL cholesterol Corn, safflower, sunflower and soybean oils, nuts, seeds  OMEGA-3 FATTY ACIDS Lowers triglycerides (blood fats) and blood pressure Salmon, mackerel, herring, sardines, flax seed, flaxseed oil, walnuts, soybean oil  THE BAD WHAT IT DOES WHERE IT'S FOUND  SATURATED FAT  Raises LDL (bad) cholesterol Butter, shortening, lard, red meat, cheese, whole milk, ice cream, coconut and palm oils  TRANS FAT Raises LDL cholesterol, lowers HDL (good) cholesterol Fried foods, some stick margarines, some cookies and crackers (look for hydrogenated fat on the ingredient list)  CHOLESTEROL FROM FOOD Too much may raise cholesterol levels Meat, poultry, seafood, eggs, milk, cheese, yogurt, butter   Plate Method (How much food of each food group) 1) Fill one half of your plate with nonstarchy vegetables: lettuce, broccoli, green beans, spinach, carrots or peppers. 2) Fill one quarter with protein: chicken, [place], fish, lean meat, eggs or tofu. 3) Fill one quarter with a nutritious carbohydrate food: brown rice, whole-wheat pasta, whole-wheat bread, peas, or corn.  Choose whole-wheat carbs for extra nutrition.  Controlling carbs helps you control your blood glucose. 4) Include a small piece of fruit at each meal, as well as 8 ounces of lowfat milk or yogurt. 5) Add 1-2 teaspoons of heart-healthy fat, such as olive or canola oil, trans fat-free margarine, avocado, nuts or seeds.

## 2014-03-16 NOTE — Progress Notes (Signed)
HPI A Caucasian 54 y.o. female presents for 5 month follow up with hypertension, hyperlipidemia, prediabetes, hypothyroidism, CKD and vitamin D.  Last visit was 10/09/13 for physical.  She currently smokes 1/2-2ppd for 40 years and not interested in quitting at this time.  Patient has history of poor compliance and H/O seizures and stroke.  Her blood pressure has been controlled at home, today their BP is BP: 140/78 mmHg.  Patient is on Lasix 40mg  and Labetalol 300mg  (1/2 tab BID).  She does not workout, due to drop foot in left foot. She denies chest pain, shortness of breath, dizziness.   She is on cholesterol medication (Crestor 20mg ) and denies myalgias. Her cholesterol is at goal. The cholesterol last visit was:   Lab Results  Component Value Date   CHOL 141 10/09/2013   HDL 40 10/09/2013   LDLCALC 75 10/09/2013   TRIG 128 10/09/2013   CHOLHDL 3.5 10/09/2013   She has not been working on diet and exercise for prediabetes, and denies increased appetite, polydipsia and polyuria. She has had prediabetes for several years.  Last A1C in the office was:  Lab Results  Component Value Date   HGBA1C 5.6 10/09/2013  Number of meals per day? 2  B- sausage egg and cheese bisucit and sausage and eggs, toast, biscuit  D- eat out- varies- fast food and sit down  Snacks? Fruit   Beverages? Diet mountain dew, water, coffee in morning and sweet tea at dinner  Patient is on Vitamin D supplement. - 1 capsule of 50,000 daily Lab Results  Component Value Date   VD25OH 90* 10/09/2013     Hypothyroidism- Currently on Levothyroxine 76mcg daily and takes in the morning by itself.  ROS- Review of Systems  Constitutional: Negative.  Negative for fever, chills, weight loss, malaise/fatigue and diaphoresis.  HENT: Negative.  Negative for congestion, ear discharge, ear pain and sore throat.   Eyes: Negative.   Respiratory: Negative.  Negative for cough, sputum production, shortness of breath and wheezing.    Cardiovascular: Positive for leg swelling. Negative for chest pain.       Patient has leg swelling more in left leg when compared to right leg.  States it always has been that way due to surgery.  Gastrointestinal: Negative.  Negative for nausea, vomiting, abdominal pain, diarrhea and constipation.  Genitourinary: Negative.  Negative for dysuria, urgency and frequency.  Musculoskeletal: Negative.   Skin: Negative.   Neurological: Negative.  Negative for dizziness and headaches.  Psychiatric/Behavioral: Negative.  Negative for depression. The patient is not nervous/anxious.    Medical History:  Active Ambulatory Problems    Diagnosis Date Noted  . Hypothyroidism 08/16/2009  . Hyperlipidemia 08/16/2009  . SMOKER 08/17/2009  . Essential hypertension 08/16/2009  . ASCAD s/p CABG 08/16/2009  . Cerebral artery occlusion with cerebral infarction 08/16/2009  . PVD 08/16/2009  . RENAL INSUFFICIENCY 08/16/2009  . History of seizures 08/16/2009  . Vitamin D deficiency   . Prediabetes 03/20/2013  . Poor compliance 10/09/2013   Resolved Ambulatory Problems    Diagnosis Date Noted  . No Resolved Ambulatory Problems   No Additional Past Medical History   Current Medications:  Current Outpatient Prescriptions on File Prior to Visit  Medication Sig Dispense Refill  . clopidogrel (PLAVIX) 75 MG tablet TAKE 1 TABLET TWICE A DAY TO PREVENT STROKE (NEED OFFICE VISIT BEFORE REFILL) 180 tablet 1  . CRESTOR 20 MG tablet TAKE 1 TABLET DAILY FOR CHOLESTEROL 60 tablet 0  . furosemide (  LASIX) 40 MG tablet TAKE 1 TABLET TWICE A DAY FOR BLOOD PRESSURE AND FLUID 180 tablet 0  . labetalol (NORMODYNE) 300 MG tablet Take 1 tablet (300 mg total) by mouth 2 (two) times daily. 180 tablet 1  . levETIRAcetam (KEPPRA) 500 MG tablet TAKE 1 TABLET TWICE A DAY FOR SEIZURES 30 tablet 0  . levothyroxine (SYNTHROID, LEVOTHROID) 75 MCG tablet TAKE 1 TABLET DAILY 90 tablet 1  . Vitamin D, Ergocalciferol, (DRISDOL) 50000  UNITS CAPS capsule TAKE 1 CAPSULE DAILY FOR SEVERE VITAMIN D DEFICIENCY SECONDARY TO MALABSORPTION 90 capsule 49   No current facility-administered medications on file prior to visit.   Allergies:  Allergies  Allergen Reactions  . Ace Inhibitors Cough  . Penicillins   . Prednisone     Dysphoria    Family history- Review and unchanged Social history- Review and unchanged  Physical Exam: BP 140/78 mmHg  Pulse 60  Temp(Src) 98 F (36.7 C) (Temporal)  Resp 16  Ht 5\' 8"  (1.727 m)  Wt 196 lb (88.905 kg)  BMI 29.81 kg/m2  SpO2 97% Wt Readings from Last 3 Encounters:  03/16/14 196 lb (88.905 kg)  10/09/13 191 lb 3.2 oz (86.728 kg)  09/07/13 189 lb 3.2 oz (85.821 kg)  Vitals Reviewed. General Appearance: Well nourished, in no apparent distress. Eyes: PERRLA, EOMIs, conjunctiva no swelling or erythema.  No scleral icterus. Sinuses: No Frontal/maxillary tenderness ENT/Mouth: External auditory canals clear, TMs without erythema, edema or bulging. No erythema, edema, or exudate on posterior pharynx.  Tonsils not swollen or erythematous. Hearing normal.  Neck: Supple, thyroid normal.  Respiratory: Respiratory effort normal, CTAB.  No w/r/r or stridor.  Cardio: RRR. Grade 2/6 systolic murmur.  No rubs or gallops.  S1S2nl. Brisk peripheral pulses with edema in left leg and no edema in right leg.  Abdomen: Soft, + normal BS.  Non tender, no guarding, rebound, hernias, masses. Lymphatics: Non tender without lymphadenopathy.  Musculoskeletal: Full ROM, 5/5 strength, normal gait.  Skin: Warm, dry intact without rashes, lesions, ecchymosis.  Neuro: Cranial nerves intact. No cerebellar symptoms. Sensation intact.  Psych: Awake and oriented X 3, normal affect, Insight and Judgment appropriate.   Assessment and Plan:  1. Essential hypertension - furosemide (LASIX) 40 MG tablet; TAKE 1 TABLET TWICE A DAY FOR BLOOD PRESSURE AND FLUID  Dispense: 180 tablet; Refill: 0 - labetalol (NORMODYNE) 300  MG tablet; Take 1 tablet (300 mg total) by mouth 2 (two) times daily.  Dispense: 180 tablet; Refill: 1 Monitor blood pressure at home. Reminder to go to the ER if any CP, SOB, nausea, dizziness, severe HA, changes vision/speech, left arm numbness and tingling, and jaw pain. - CBC with Differential - BASIC METABOLIC PANEL WITH GFR - Hepatic function panel  2. Hyperlipidemia - rosuvastatin (CRESTOR) 20 MG tablet; Take 1 tablet (20 mg total) by mouth daily. for cholesterol  Dispense: 90 tablet; Refill: 0 Please follow recommended diet and exercise.  Check cholesterol -  Lipid panel  3. Prediabetes Please follow recommended diet and exercise.  Check A1C and Insulin levels. - Hemoglobin A1c - Insulin, fasting  4. Hypothyroidism, unspecified hypothyroidism type - levothyroxine (SYNTHROID, LEVOTHROID) 75 MCG tablet; Take 1 tablet (75 mcg total) by mouth daily.  Dispense: 90 tablet; Refill: 1 Check TSH level, continue medications the same, reminded to take on an empty stomach 30-27mins before food.  - TSH  5. CKD (chronic kidney disease) stage 3, GFR 30-59 ml/min Will monitor kidney function  6. Vitamin D deficiency Continue  vitamin D as prescribed.  Check vitamin D level. - Vit D  25 hydroxy (rtn osteoporosis monitoring)  7. Encounter for long-term (current) use of medications Will monitor kidney and liver function. - CBC with Differential - BASIC METABOLIC PANEL WITH GFR - Hepatic function panel  8. H/O Seizures and H/O Stroke - clopidogrel (PLAVIX) 75 MG tablet; TAKE 1 TABLET TWICE A DAY TO PREVENT STROKE (NEED OFFICE VISIT BEFORE REFILL)  Dispense: 180 tablet; Refill: 1 - levETIRAcetam (KEPPRA) 500 MG tablet; TAKE 1 TABLET TWICE A DAY FOR SEIZURES  Dispense: 180 tablet; Refill: 0  9. Smoker Not interested in quitting right now.  Continue diet and meds as discussed. Further disposition pending results of labs. Discussed medication effects and SE's.  Pt agreed to treatment  plan. Please keep your follow up appt in April 2016 and physical appt in August 2016.  Jude Naclerio, Stephani Police, PA-C 8:58 AM [facility]

## 2014-03-17 LAB — VITAMIN D 25 HYDROXY (VIT D DEFICIENCY, FRACTURES): VIT D 25 HYDROXY: 79 ng/mL (ref 30–100)

## 2014-03-17 LAB — INSULIN, FASTING: INSULIN FASTING, SERUM: 11.6 u[IU]/mL (ref 2.0–19.6)

## 2014-03-25 ENCOUNTER — Other Ambulatory Visit: Payer: Self-pay | Admitting: *Deleted

## 2014-03-25 DIAGNOSIS — Z87898 Personal history of other specified conditions: Secondary | ICD-10-CM

## 2014-03-25 MED ORDER — LEVETIRACETAM 500 MG PO TABS: ORAL_TABLET | ORAL | Status: DC

## 2014-04-27 ENCOUNTER — Ambulatory Visit: Payer: Self-pay | Admitting: Internal Medicine

## 2014-06-25 ENCOUNTER — Other Ambulatory Visit: Payer: Self-pay | Admitting: *Deleted

## 2014-06-25 ENCOUNTER — Ambulatory Visit (INDEPENDENT_AMBULATORY_CARE_PROVIDER_SITE_OTHER): Payer: 59 | Admitting: Internal Medicine

## 2014-06-25 ENCOUNTER — Other Ambulatory Visit: Payer: Self-pay | Admitting: Physician Assistant

## 2014-06-25 ENCOUNTER — Encounter: Payer: Self-pay | Admitting: Internal Medicine

## 2014-06-25 VITALS — BP 130/82 | HR 60 | Temp 97.5°F | Resp 16 | Ht 68.0 in | Wt 194.5 lb

## 2014-06-25 DIAGNOSIS — E782 Mixed hyperlipidemia: Secondary | ICD-10-CM

## 2014-06-25 DIAGNOSIS — R7303 Prediabetes: Secondary | ICD-10-CM

## 2014-06-25 DIAGNOSIS — E559 Vitamin D deficiency, unspecified: Secondary | ICD-10-CM

## 2014-06-25 DIAGNOSIS — I10 Essential (primary) hypertension: Secondary | ICD-10-CM

## 2014-06-25 DIAGNOSIS — Z87898 Personal history of other specified conditions: Secondary | ICD-10-CM

## 2014-06-25 DIAGNOSIS — E039 Hypothyroidism, unspecified: Secondary | ICD-10-CM

## 2014-06-25 DIAGNOSIS — G40909 Epilepsy, unspecified, not intractable, without status epilepticus: Secondary | ICD-10-CM

## 2014-06-25 DIAGNOSIS — Z79899 Other long term (current) drug therapy: Secondary | ICD-10-CM | POA: Insufficient documentation

## 2014-06-25 DIAGNOSIS — R7309 Other abnormal glucose: Secondary | ICD-10-CM

## 2014-06-25 LAB — CBC WITH DIFFERENTIAL/PLATELET
Basophils Absolute: 0.1 10*3/uL (ref 0.0–0.1)
Basophils Relative: 1 % (ref 0–1)
EOS ABS: 0.2 10*3/uL (ref 0.0–0.7)
Eosinophils Relative: 3 % (ref 0–5)
HEMATOCRIT: 43.1 % (ref 36.0–46.0)
HEMOGLOBIN: 14.4 g/dL (ref 12.0–15.0)
Lymphocytes Relative: 24 % (ref 12–46)
Lymphs Abs: 1.4 10*3/uL (ref 0.7–4.0)
MCH: 31 pg (ref 26.0–34.0)
MCHC: 33.4 g/dL (ref 30.0–36.0)
MCV: 92.9 fL (ref 78.0–100.0)
MONOS PCT: 9 % (ref 3–12)
MPV: 10 fL (ref 8.6–12.4)
Monocytes Absolute: 0.5 10*3/uL (ref 0.1–1.0)
NEUTROS ABS: 3.8 10*3/uL (ref 1.7–7.7)
Neutrophils Relative %: 63 % (ref 43–77)
PLATELETS: 185 10*3/uL (ref 150–400)
RBC: 4.64 MIL/uL (ref 3.87–5.11)
RDW: 14.3 % (ref 11.5–15.5)
WBC: 6 10*3/uL (ref 4.0–10.5)

## 2014-06-25 LAB — LIPID PANEL
CHOLESTEROL: 154 mg/dL (ref 0–200)
HDL: 42 mg/dL — AB (ref >=46)
LDL CALC: 86 mg/dL (ref 0–99)
Total CHOL/HDL Ratio: 3.7 Ratio
Triglycerides: 131 mg/dL (ref <150)
VLDL: 26 mg/dL (ref 0–40)

## 2014-06-25 LAB — TSH: TSH: 1.075 u[IU]/mL (ref 0.350–4.500)

## 2014-06-25 LAB — BASIC METABOLIC PANEL WITH GFR
BUN: 20 mg/dL (ref 6–23)
CHLORIDE: 100 meq/L (ref 96–112)
CO2: 32 mEq/L (ref 19–32)
Calcium: 9.4 mg/dL (ref 8.4–10.5)
Creat: 1.45 mg/dL — ABNORMAL HIGH (ref 0.50–1.10)
GFR, EST AFRICAN AMERICAN: 46 mL/min — AB
GFR, Est Non African American: 40 mL/min — ABNORMAL LOW
GLUCOSE: 110 mg/dL — AB (ref 70–99)
POTASSIUM: 3.5 meq/L (ref 3.5–5.3)
SODIUM: 143 meq/L (ref 135–145)

## 2014-06-25 LAB — HEPATIC FUNCTION PANEL
ALBUMIN: 4 g/dL (ref 3.5–5.2)
ALK PHOS: 48 U/L (ref 39–117)
ALT: 12 U/L (ref 0–35)
AST: 20 U/L (ref 0–37)
BILIRUBIN DIRECT: 0.1 mg/dL (ref 0.0–0.3)
BILIRUBIN INDIRECT: 0.2 mg/dL (ref 0.2–1.2)
BILIRUBIN TOTAL: 0.3 mg/dL (ref 0.2–1.2)
TOTAL PROTEIN: 6.2 g/dL (ref 6.0–8.3)

## 2014-06-25 LAB — HEMOGLOBIN A1C
Hgb A1c MFr Bld: 5.8 % — ABNORMAL HIGH (ref <5.7)
MEAN PLASMA GLUCOSE: 120 mg/dL — AB (ref <117)

## 2014-06-25 LAB — MAGNESIUM: Magnesium: 1.8 mg/dL (ref 1.5–2.5)

## 2014-06-25 MED ORDER — LEVETIRACETAM 500 MG PO TABS: ORAL_TABLET | ORAL | Status: DC

## 2014-06-25 MED ORDER — ROSUVASTATIN CALCIUM 20 MG PO TABS: 20.0000 mg | ORAL_TABLET | Freq: Every day | ORAL | Status: DC

## 2014-06-25 NOTE — Patient Instructions (Signed)

## 2014-06-25 NOTE — Progress Notes (Signed)
Patient ID: Olivia Walsh, female   DOB: 1961-01-01, 54 y.o.   MRN: [id number]   This very nice 54 y.o. MWF presents for 3 month follow up with Hypertension, ASCAD s/p CABG, Hyperlipidemia, Pre-Diabetes and Vitamin D Deficiency. Patient has hx/o [facility] in 2005, 2008 and 2012. Patient also has hx/o embolization of cerebral aneurysms.   Patient is treated for HTN since age 22 yo (54) & BP has been controlled at home. Today's BP: 130/82 mmHg. Patient has premature ASCAD undergoing CABG at age 54 (2002). She denies any complaints of cardiac type chest pain, palpitations, dyspnea/orthopnea/PND, dizziness, claudication, or dependent edema.   Hyperlipidemia is controlled with diet & meds. Patient denies myalgias or other med SE's. Last Lipids were at goal - Total Chol 144; HDL 38; LDL 86; Triglycerides 100 on 03/16/2014   Also, the patient has history of PreDiabetes and has had no symptoms of reactive hypoglycemia, diabetic polys, paresthesias or visual blurring.  Last A1c was 5.6% on 03/16/2014.   Further, the patient also has history of Vitamin D Deficiency of 39 in 2008 and 19 in 2010. Currently she supplements vitamin D without any suspected side-effects. Last vitamin D was 79 on 03/16/2014.    Medication Sig  . clopidogrel  75 MG tablet TAKE 1 TABLET TWICE A DAY TO PREVENT STROKE (NEED OFFICE VISIT BEFORE REFILL)  . furosemide  40 MG tablet TAKE 1 TABLET TWICE A DAY FOR BLOOD PRESSURE AND FLUID  . labetalol  300 MG tablet Take 1 tablet (300 mg total) by mouth 2 (two) times daily.  Marland Kitchen levETIRAcetam (KEPPRA) 500 MG tablet TAKE 1 TABLET TWICE A DAY FOR SEIZURES  . levothyroxine 75 MCG tablet Take 1 tablet (75 mcg total) by mouth daily.  . rosuvastatin 20 MG tablet Take 1 tablet (20 mg total) by mouth daily. for cholesterol  . Vitamin D 50,000 UNITS CAPS  TAKE 1 CAPSULE DAILY FOR SEVERE VITAMIN D DEFICIENCY SECONDARY TO MALABSORPTION   Allergies  Allergen Reactions  . Ace Inhibitors Cough  . Penicillins    . Prednisone     Dysphoria   PMHx:   Past Medical History  Diagnosis Date  . Vitamin D deficiency    Immunization History  Administered Date(s) Administered  . PPD Test 10/09/2013  . Td 10/24/2006   Past Surgical History  Procedure Laterality Date  . Coronary artery bypass graft  2002    Dr Cyndia Bent  . Embolization  2005    Embolization of right PICA aneurysm   FHx:    Reviewed / unchanged  SHx:    Reviewed / unchanged  Systems Review:  Constitutional: Denies fever, chills, wt changes, headaches, insomnia, fatigue, night sweats, change in appetite. Eyes: Denies redness, blurred vision, diplopia, discharge, itchy, watery eyes.  ENT: Denies discharge, congestion, post nasal drip, epistaxis, sore throat, earache, hearing loss, dental pain, tinnitus, vertigo, sinus pain, snoring.  CV: Denies chest pain, palpitations, irregular heartbeat, syncope, dyspnea, diaphoresis, orthopnea, PND, claudication or edema. Respiratory: denies cough, dyspnea, DOE, pleurisy, hoarseness, laryngitis, wheezing.  Gastrointestinal: Denies dysphagia, odynophagia, heartburn, reflux, water brash, abdominal pain or cramps, nausea, vomiting, bloating, diarrhea, constipation, hematemesis, melena, hematochezia  or hemorrhoids. Genitourinary: Denies dysuria, frequency, urgency, nocturia, hesitancy, discharge, hematuria or flank pain. Musculoskeletal: Denies arthralgias, myalgias, stiffness, jt. swelling, pain, limping or strain/sprain.  Skin: Denies pruritus, rash, hives, warts, acne, eczema or change in skin lesion(s). Neuro: No weakness, tremor, incoordination, spasms, paresthesia or pain. Psychiatric: Denies confusion, memory loss or sensory loss. Endo: Denies  change in weight, skin or hair change.  Heme/Lymph: No excessive bleeding, bruising or enlarged lymph nodes.  Physical Exam  BP 130/82   Pulse 60  Temp 97.5 F   Resp 16  Ht 5\' 8"    Wt 194 lb 8 oz     BMI 29.58   Appears well nourished and in  no distress. Eyes: PERRLA, EOMs, conjunctiva no swelling or erythema. Sinuses: No frontal/maxillary tenderness ENT/Mouth: EAC's clear, TM's nl w/o erythema, bulging. Nares clear w/o erythema, swelling, exudates. Oropharynx clear without erythema or exudates. Oral hygiene is good. Tongue normal, non obstructing. Hearing intact.  Neck: Supple. Thyroid nl. Car 2+/2+ without bruits, nodes or JVD. Chest: Respirations nl with BS clear & equal w/o rales, rhonchi, wheezing or stridor.  Cor: Heart sounds normal w/ regular rate and rhythm without sig. murmurs, gallops, clicks, or rubs. Peripheral pulses normal and equal  without edema.  Abdomen: Soft & bowel sounds normal. Non-tender w/o guarding, rebound, hernias, masses, or organomegaly.  Lymphatics: Unremarkable.  Musculoskeletal: Full ROM all peripheral extremities, joint stability, 5/5 strength, and normal gait.  Skin: Warm, dry without exposed rashes, lesions or ecchymosis apparent.  Neuro: Cranial nerves intact, reflexes equal bilaterally. Sensory-motor testing grossly intact. Tendon reflexes grossly intact.  Pysch: Alert & oriented x 3.  Insight and judgement nl & appropriate. No ideations.  Assessment and Plan:  1. Essential hypertension   2. Hyperlipidemia  - Lipid panel  3. Prediabetes  - Hemoglobin A1c - Insulin, random  4. Vitamin D deficiency  - Vit D  25 hydroxy  5. Hypothyroidism,   - TSH  6. Seizure disorder   7. Medication management  - CBC with Differential/Platelet - BASIC METABOLIC PANEL WITH GFR - Hepatic function panel - Magnesium   Recommended regular exercise, BP monitoring, weight control, and discussed med and SE's. Recommended labs to assess and monitor clinical status. Further disposition pending results of labs. Over 30 minutes of exam, counseling, chart review was performed

## 2014-06-26 ENCOUNTER — Encounter: Payer: Self-pay | Admitting: Internal Medicine

## 2014-06-26 LAB — INSULIN, RANDOM: Insulin: 33.6 u[IU]/mL — ABNORMAL HIGH (ref 2.0–19.6)

## 2014-06-26 LAB — VITAMIN D 25 HYDROXY (VIT D DEFICIENCY, FRACTURES): VIT D 25 HYDROXY: 56 ng/mL (ref 30–100)

## 2014-07-13 ENCOUNTER — Encounter: Payer: Self-pay | Admitting: Internal Medicine

## 2014-07-13 ENCOUNTER — Ambulatory Visit (INDEPENDENT_AMBULATORY_CARE_PROVIDER_SITE_OTHER): Payer: 59 | Admitting: Internal Medicine

## 2014-07-13 VITALS — BP 170/90 | HR 62 | Temp 98.0°F | Resp 18 | Ht 68.0 in | Wt 195.0 lb

## 2014-07-13 DIAGNOSIS — J069 Acute upper respiratory infection, unspecified: Secondary | ICD-10-CM

## 2014-07-13 MED ORDER — CETIRIZINE HCL 10 MG PO CAPS: 10.0000 mg | ORAL_CAPSULE | Freq: Every day | ORAL | Status: DC

## 2014-07-13 MED ORDER — BENZONATATE 100 MG PO CAPS: 100.0000 mg | ORAL_CAPSULE | Freq: Four times a day (QID) | ORAL | Status: DC | PRN

## 2014-07-13 MED ORDER — ALBUTEROL SULFATE HFA 108 (90 BASE) MCG/ACT IN AERS: 2.0000 | INHALATION_SPRAY | RESPIRATORY_TRACT | Status: DC | PRN

## 2014-07-13 MED ORDER — ALBUTEROL SULFATE HFA 108 (90 BASE) MCG/ACT IN AERS
2.0000 | INHALATION_SPRAY | RESPIRATORY_TRACT | Status: DC | PRN
Start: 2014-07-13 — End: 2014-07-13

## 2014-07-13 MED ORDER — PREDNISONE 20 MG PO TABS: ORAL_TABLET | ORAL | Status: DC

## 2014-07-13 MED ORDER — AZITHROMYCIN 250 MG PO TABS: ORAL_TABLET | ORAL | Status: DC

## 2014-07-13 MED ORDER — FLUTICASONE PROPIONATE 50 MCG/ACT NA SUSP: 2.0000 | Freq: Every day | NASAL | Status: DC

## 2014-07-13 NOTE — Patient Instructions (Signed)

## 2014-07-13 NOTE — Progress Notes (Signed)
Patient ID: Olivia Walsh, female   DOB: Dec 22, 1960, 54 y.o.   MRN: [id number]  HPI  Patient presents to the office for evaluation of cough.  It has been going on for 5 days.  Patient reports night > day, dry, barky, worse with lying down.  They also endorse change in voice, postnasal drip, shortness of breath and nasal congestion, rhinorrhea, bilateral ear pain.  .  They have tried antitussives.  They report that nothing has worked.  They denies other sick contacts.  Patient just returned from travel to nieces graduation.  She does have severe allergies.  Patient smokes a ppd.        Review of Systems  Constitutional: Positive for malaise/fatigue. Negative for fever and chills.  HENT: Positive for congestion, ear pain and sore throat. Negative for ear discharge and nosebleeds.   Respiratory: Positive for cough and shortness of breath. Negative for sputum production and wheezing.   Cardiovascular: Negative for chest pain.  Neurological: Negative for headaches.    PE:  General:  Alert and non-toxic, WDWN, NAD HEENT: NCAT, PERLA, EOM normal, no occular discharge or erythema.  Nasal mucosal edema with sinus tenderness to palpation.  Oropharynx clear with minimal oropharyngeal edema and erythema.  Mucous membranes moist and pink. Neck:  Cervical adenopathy Chest:  RRR no MRGs.  Lungs clear to auscultation A&P with no wheezes rhonchi or rales.   Abdomen: +BS x 4 quadrants, soft, non-tender, no guarding, rigidity, or rebound. Skin: warm and dry no rash Neuro: A&Ox4, CN II-XII grossly intact  Assessment and Plan:   1. Acute URI -nasal saline - albuterol (PROVENTIL HFA;VENTOLIN HFA) 108 (90 BASE) MCG/ACT inhaler; Inhale 2 puffs into the lungs every 2 (two) hours as needed for wheezing or shortness of breath (cough).  Dispense: 1 Inhaler; Refill: 0 - predniSONE (DELTASONE) 20 MG tablet; 3 tabs po day one, then 2 tabs daily x 4 days  Dispense: 11 tablet; Refill: 0 - benzonatate (TESSALON  PERLES) 100 MG capsule; Take 1 capsule (100 mg total) by mouth every 6 (six) hours as needed for cough.  Dispense: 30 capsule; Refill: 1 - Cetirizine HCl (ZYRTEC ALLERGY) 10 MG CAPS; Take 1 capsule (10 mg total) by mouth at bedtime.  Dispense: 30 capsule; Refill: 0 - fluticasone (FLONASE) 50 MCG/ACT nasal spray; Place 2 sprays into both nostrils daily.  Dispense: 16 g; Refill: 0 - azithromycin (ZITHROMAX Z-PAK) 250 MG tablet; 2 po day one, then 1 daily x 4 days  Dispense: 5 tablet; Refill: 0  2. HTN -monitor at home.  Call if consistently over 160/90 and we will adjust medications

## 2014-07-16 ENCOUNTER — Other Ambulatory Visit: Payer: Self-pay | Admitting: Internal Medicine

## 2014-07-16 ENCOUNTER — Telehealth: Payer: Self-pay

## 2014-07-16 MED ORDER — HYDRALAZINE HCL 10 MG PO TABS: 10.0000 mg | ORAL_TABLET | Freq: Three times a day (TID) | ORAL | Status: DC

## 2014-07-16 NOTE — Telephone Encounter (Signed)
Pt's BP is staying around 180/90. Pt was told to monitor BP at recent OV and to call back if BP was still high. Please advise.

## 2014-10-12 ENCOUNTER — Other Ambulatory Visit: Payer: Self-pay | Admitting: Physician Assistant

## 2014-10-21 ENCOUNTER — Other Ambulatory Visit: Payer: Self-pay | Admitting: Physician Assistant

## 2014-10-26 ENCOUNTER — Encounter: Payer: Self-pay | Admitting: Internal Medicine

## 2014-12-02 ENCOUNTER — Encounter: Payer: Self-pay | Admitting: Internal Medicine

## 2014-12-02 ENCOUNTER — Ambulatory Visit (INDEPENDENT_AMBULATORY_CARE_PROVIDER_SITE_OTHER): Payer: 59 | Admitting: Internal Medicine

## 2014-12-02 VITALS — BP 154/88 | HR 60 | Temp 97.0°F | Resp 16 | Ht 68.0 in | Wt 196.8 lb

## 2014-12-02 DIAGNOSIS — G40909 Epilepsy, unspecified, not intractable, without status epilepticus: Secondary | ICD-10-CM

## 2014-12-02 DIAGNOSIS — E782 Mixed hyperlipidemia: Secondary | ICD-10-CM

## 2014-12-02 DIAGNOSIS — Z1212 Encounter for screening for malignant neoplasm of rectum: Secondary | ICD-10-CM

## 2014-12-02 DIAGNOSIS — N1832 Chronic kidney disease, stage 3b: Secondary | ICD-10-CM

## 2014-12-02 DIAGNOSIS — R5383 Other fatigue: Secondary | ICD-10-CM

## 2014-12-02 DIAGNOSIS — Z0001 Encounter for general adult medical examination with abnormal findings: Secondary | ICD-10-CM

## 2014-12-02 DIAGNOSIS — Z6829 Body mass index (BMI) 29.0-29.9, adult: Secondary | ICD-10-CM

## 2014-12-02 DIAGNOSIS — I2510 Atherosclerotic heart disease of native coronary artery without angina pectoris: Secondary | ICD-10-CM

## 2014-12-02 DIAGNOSIS — Z0000 Encounter for general adult medical examination without abnormal findings: Secondary | ICD-10-CM

## 2014-12-02 DIAGNOSIS — Z111 Encounter for screening for respiratory tuberculosis: Secondary | ICD-10-CM | POA: Diagnosis not present

## 2014-12-02 DIAGNOSIS — Z79899 Other long term (current) drug therapy: Secondary | ICD-10-CM

## 2014-12-02 DIAGNOSIS — R7303 Prediabetes: Secondary | ICD-10-CM

## 2014-12-02 DIAGNOSIS — I10 Essential (primary) hypertension: Secondary | ICD-10-CM | POA: Diagnosis not present

## 2014-12-02 DIAGNOSIS — I6350 Cerebral infarction due to unspecified occlusion or stenosis of unspecified cerebral artery: Secondary | ICD-10-CM

## 2014-12-02 DIAGNOSIS — Z23 Encounter for immunization: Secondary | ICD-10-CM

## 2014-12-02 DIAGNOSIS — E559 Vitamin D deficiency, unspecified: Secondary | ICD-10-CM

## 2014-12-02 DIAGNOSIS — E039 Hypothyroidism, unspecified: Secondary | ICD-10-CM

## 2014-12-02 DIAGNOSIS — N183 Chronic kidney disease, stage 3 (moderate): Secondary | ICD-10-CM

## 2014-12-02 DIAGNOSIS — E663 Overweight: Secondary | ICD-10-CM | POA: Insufficient documentation

## 2014-12-02 MED ORDER — ASPIRIN 325 MG PO TABS: 325.0000 mg | ORAL_TABLET | Freq: Every day | ORAL | Status: AC

## 2014-12-02 NOTE — Patient Instructions (Signed)

## 2014-12-02 NOTE — Progress Notes (Signed)
Patient ID: Olivia Walsh, female   DOB: Oct 09, 1960, 54 y.o.   MRN: [id number]   Comprehensive Examination  This very nice 54 y.o. MWF presents for complete physical.  Patient has been followed for HTN, ASCAD, Prediabetes, Hyperlipidemia, and Vitamin D Deficiency.    HTN predates since 28 at age 54 yo. Patient's BP has been variably controlled at home. In 2002 at age 54, she underwent emergent CABG. Patient denies any cardiac symptoms as chest pain, palpitations, shortness of breath, dizziness or ankle swelling. Today's BP is elevated at 154/88 and confirmed by wrist cuff & wall cuff. In 2005, she has hospitalized at [facility] with a SAHfrom cerebral aneurysms and had a stent placed. Then in 2008 and 2012 she had coiling of 2 more cerebral aneurysms.   Patient also is followed by Dr Kellie Simmering for ASPVD/ caudication. Patient also has hx/o seizures related to her aneurysms and reports that  her last seizure was in 2008.     Patient's hyperlipidemia is controlled with diet and medications. Patient denies myalgias or other medication SE's. Last lipids were  Cholesterol 154; HDL 42*; LDL 86; Triglycerides 131 on 06/25/2014.   Patient has Morbid Obesity and is followed expectantly for prediabetes predating and patient denies reactive hypoglycemic symptoms, visual blurring, diabetic polys, or paresthesias. Last A1c was 5.8% on 06/25/2014.   Finally, patient has history of Vitamin D Deficiency of 39 in 2008 and then lower at 79 in 2010  and last Vitamin D was 56 on 06/25/2014.      Medication Sig  . Cetirizine10 MG CAPS Take 1 capsule (10 mg total) by mouth at bedtime.  . clopidogrel  75 MG tablet TAKE 1 TABLET TWICE A DAY TO PREVENT STROKE (NEED OFFICE VISIT BEFORE REFILL)  . CRESTOR 20 MG tablet TAKE 1 TABLET DAILY FOR CHOLESTEROL  . FLONASE  nasal spray Place 2 sprays into both nostrils daily.  . furosemide  40 MG tablet TAKE 1 TABLET TWICE A DAY FOR BLOOD PRESSURE AND FLUID  . hydrALAZINE  10 MG tablet  Take 1 tablet (10 mg total) by mouth 3 (three) times daily.  Marland Kitchen labetalol  300 MG tablet Take 1 tablet (300 mg total) by mouth 2 (two) times daily.  Marland Kitchen levETIRAcetam (KEPPRA) 500 MG tablet TAKE 1 TABLET TWICE A DAY FOR SEIZURES  . levothyroxine  75 MCG tablet TAKE 1 TABLET DAILY  . Vitamin D 50,000 UNITS  TAKE 1 CAPSULE DAILY FOR SEVERE VITAMIN D DEFICIENCY SECONDARY TO MALABSORPTION   Allergies  Allergen Reactions  . Ace Inhibitors Cough  . Penicillins   . Prednisone     Dysphoria   Past Medical History  Diagnosis Date  . Vitamin D deficiency    Health Maintenance  Topic Date Due  . PAP SMEAR  06/19/1979  . COLONOSCOPY  06/18/2008  . MAMMOGRAM  01/08/2010  . INFLUENZA VACCINE  09/27/2014  . TETANUS/TDAP  10/23/2016  . Hepatitis C Screening  Completed  . HIV Screening  Completed   Immunization History  Administered Date(s) Administered  . Influenza Split 12/02/2014  . PPD Test 10/09/2013, 12/02/2014  . Td 10/24/2006   Past Surgical History  Procedure Laterality Date  . Coronary artery bypass graft  2002    Dr Cyndia Bent  . Embolization  2005    Embolization of right PICA aneurysm   Family History  Problem Relation Age of Onset  . Heart disease Mother   . Cancer Father     brain  . Leukemia Sister   .  Heart disease Brother    Social History  Substance Use Topics  . Smoking status: Current Every Day Smoker -- 1.00 packs/day for 40 years    Types: Cigarettes  . Smokeless tobacco: Never Used     Comment: 1/2-2 ppd   . Alcohol Use: No    ROS Constitutional: Denies fever, chills, weight loss/gain, headaches, insomnia,  night sweats, and change in appetite. Does c/o fatigue. Eyes: Denies redness, blurred vision, diplopia, discharge, itchy, watery eyes.  ENT: Denies discharge, congestion, post nasal drip, epistaxis, sore throat, earache, hearing loss, dental pain, Tinnitus, Vertigo, Sinus pain, snoring.  Cardio: Denies chest pain, palpitations, irregular heartbeat,  syncope, dyspnea, diaphoresis, orthopnea, PND, claudication, edema Respiratory: denies cough, dyspnea, DOE, pleurisy, hoarseness, laryngitis, wheezing.  Gastrointestinal: Denies dysphagia, heartburn, reflux, water brash, pain, cramps, nausea, vomiting, bloating, diarrhea, constipation, hematemesis, melena, hematochezia, jaundice, hemorrhoids Genitourinary: Denies dysuria, frequency, urgency, nocturia, hesitancy, discharge, hematuria, flank pain Breast: Breast lumps, nipple discharge, bleeding.  Musculoskeletal: Denies arthralgia, myalgia, stiffness, Jt. Swelling, pain, limp, and strain/sprain. Denies falls. Skin: Denies puritis, rash, hives, warts, acne, eczema, changing in skin lesion Neuro: No weakness, tremor, incoordination, spasms, paresthesia, pain Psychiatric: Denies confusion, memory loss, sensory loss. Denies Depression. Endocrine: Denies change in weight, skin, hair change, nocturia, and paresthesia, diabetic polys, visual blurring, hyper / hypo glycemic episodes.  Heme/Lymph: No excessive bleeding, bruising, enlarged lymph nodes.  Physical Exam  BP 154/88 mmHg  Pulse 60  Temp(Src) 97 F (36.1 C)  Resp 16  Ht 5\' 8"  (1.727 m)  Wt 196 lb 12.8 oz (89.268 kg)  BMI 29.93 kg/m2  General Appearance: Over  nourished with central obesity and in no apparent distress. Tobacco halitosis. Eyes: PERRLA, EOMs, conjunctiva no swelling or erythema, normal fundi and vessels. Sinuses: No frontal/maxillary tenderness ENT/Mouth: EACs patent / TMs  nl. Nares clear without erythema, swelling, mucoid exudates. Oral hygiene is good. No erythema, swelling, or exudate. Tongue normal, non-obstructing. Tonsils not swollen or erythematous. Hearing normal.  Neck: Supple, thyroid normal. No bruits, nodes or JVD. Respiratory: Respiratory effort normal.  BS equal and clear bilateral with scatterded rales and rhonci and no wheezing or stridor. Cardio: Heart sounds are normal with regular rate and rhythm and no  murmurs, rubs or gallops. Peripheral pulses are normal and equal bilaterally without edema. No aortic or femoral bruits. Chest: Median Sternotomy Scar. Symmetric with normal excursions and percussion. Breasts: Symmetric, without lumps, nipple discharge, retractions, or fibrocystic changes.  Abdomen: Flat, soft, with bowel sounds. Nontender, no guarding, rebound, hernias, masses, or organomegaly.  Lymphatics: Non tender without lymphadenopathy.  Musculoskeletal: Full ROM all peripheral extremities, joint stability, 5/5 strength, and normal gait. Skin: Warm and dry without rashes, lesions, cyanosis, clubbing or  ecchymosis.  Neuro: Cranial nerves intact, reflexes equal bilaterally. Normal muscle tone, no cerebellar symptoms. Sensation intact.  Pysch: Alert and oriented X 3, normal affect, Insight and Judgment appropriate.   Assessment and Plan  1. Encounter for general adult medical examination with abnormal findings   2. Essential hypertension  - Microalbumin / creatinine urine ratio - EKG 12-Lead - [place], RETROPERITNL ABD,  LTD - TSH  3. Hyperlipidemia  - Lipid panel  4. Prediabetes  - Hemoglobin A1c - Insulin, random  5. Vitamin D deficiency  - Vit D  25 hydroxy   6. Atherosclerosis of native coronary artery without angina pectoris, unspecified whether native or transplanted heart   7. Cerebral artery occlusion with cerebral infarction ([place])   8. Hypothyroidism, unspecified hypothyroidism type  -  TSH  9. Seizure disorder (HCC)  - Levetiracetam level  10. Chronic kidney disease (CKD) stage G3b/A1, moderately decreased glomerular filtration rate (GFR) between 30-44 mL/min/1.73 square meter and albuminuria creatinine ratio less than 30 mg/g  - Microalbumin / creatinine urine ratio  11. Screening for rectal cancer  - POC Hemoccult Bld/Stl   12. Other fatigue  - Vitamin B12 - Iron and TIBC - TSH  13. Medication management  - Urinalysis, Routine w reflex  microscopic  - CBC with Differential/Platelet - BASIC METABOLIC PANEL WITH GFR - Hepatic function panel - Magnesium - Levetiracetam level  14. BMI 29.66,  adult   15. Screening examination for pulmonary tuberculosis  - PPD  16. Need for prophylactic vaccination and inoculation against influenza  - Flu vaccine > 3yo with preservative IM (Fluvirin Influenza Split)   Continue prudent diet as discussed, weight control, BP monitoring, regular exercise, and medications. Strongly encouraged patient to taper or stopping tobacco smoking.  Discussed med's effects and SE's. Screening labs and tests as requested with regular follow-up as recommended.  Over 40 minutes of exam, counseling, chart review was performed.

## 2014-12-03 LAB — URINALYSIS, MICROSCOPIC ONLY
BACTERIA UA: NONE SEEN [HPF]
Casts: NONE SEEN [LPF]
Crystals: NONE SEEN [HPF]
RBC / HPF: NONE SEEN RBC/HPF (ref <=2)
Squamous Epithelial / LPF: NONE SEEN [HPF] (ref <=5)
WBC UA: NONE SEEN WBC/HPF (ref <=5)
YEAST: NONE SEEN [HPF]

## 2014-12-03 LAB — BASIC METABOLIC PANEL WITH GFR
BUN: 26 mg/dL — ABNORMAL HIGH (ref 7–25)
CALCIUM: 9.6 mg/dL (ref 8.6–10.4)
CO2: 35 mmol/L — ABNORMAL HIGH (ref 20–31)
CREATININE: 1.61 mg/dL — AB (ref 0.50–1.05)
Chloride: 98 mmol/L (ref 98–110)
GFR, EST AFRICAN AMERICAN: 41 mL/min — AB (ref >=60)
GFR, EST NON AFRICAN AMERICAN: 35 mL/min — AB (ref >=60)
GLUCOSE: 80 mg/dL (ref 65–99)
Potassium: 3.7 mmol/L (ref 3.5–5.3)
SODIUM: 141 mmol/L (ref 135–146)

## 2014-12-03 LAB — HEPATIC FUNCTION PANEL
ALT: 12 U/L (ref 6–29)
AST: 19 U/L (ref 10–35)
Albumin: 4.1 g/dL (ref 3.6–5.1)
Alkaline Phosphatase: 52 U/L (ref 33–130)
Bilirubin, Direct: <0.1 mg/dL (ref <=0.2)
TOTAL PROTEIN: 6.5 g/dL (ref 6.1–8.1)
Total Bilirubin: 0.3 mg/dL (ref 0.2–1.2)

## 2014-12-03 LAB — LIPID PANEL
CHOLESTEROL: 150 mg/dL (ref 125–200)
HDL: 38 mg/dL — ABNORMAL LOW (ref >=46)
LDL Cholesterol: 79 mg/dL (ref <130)
TRIGLYCERIDES: 166 mg/dL — AB (ref <150)
Total CHOL/HDL Ratio: 3.9 Ratio (ref <=5.0)
VLDL: 33 mg/dL — AB (ref <30)

## 2014-12-03 LAB — URINALYSIS, ROUTINE W REFLEX MICROSCOPIC
BILIRUBIN URINE: NEGATIVE
Glucose, UA: NEGATIVE
KETONES UR: NEGATIVE
Leukocytes, UA: NEGATIVE
Nitrite: NEGATIVE
Specific Gravity, Urine: 1.007 (ref 1.001–1.035)
pH: 6.5 (ref 5.0–8.0)

## 2014-12-03 LAB — CBC WITH DIFFERENTIAL/PLATELET
BASOS PCT: 1 % (ref 0–1)
Basophils Absolute: 0.1 10*3/uL (ref 0.0–0.1)
EOS PCT: 4 % (ref 0–5)
Eosinophils Absolute: 0.3 10*3/uL (ref 0.0–0.7)
HEMATOCRIT: 42.4 % (ref 36.0–46.0)
Hemoglobin: 14 g/dL (ref 12.0–15.0)
Lymphocytes Relative: 26 % (ref 12–46)
Lymphs Abs: 1.7 10*3/uL (ref 0.7–4.0)
MCH: 30.7 pg (ref 26.0–34.0)
MCHC: 33 g/dL (ref 30.0–36.0)
MCV: 93 fL (ref 78.0–100.0)
MONO ABS: 0.5 10*3/uL (ref 0.1–1.0)
MPV: 9.9 fL (ref 8.6–12.4)
Monocytes Relative: 8 % (ref 3–12)
Neutro Abs: 4 10*3/uL (ref 1.7–7.7)
Neutrophils Relative %: 61 % (ref 43–77)
Platelets: 196 10*3/uL (ref 150–400)
RBC: 4.56 MIL/uL (ref 3.87–5.11)
RDW: 14.1 % (ref 11.5–15.5)
WBC: 6.6 10*3/uL (ref 4.0–10.5)

## 2014-12-03 LAB — IRON AND TIBC
%SAT: 22 % (ref 11–50)
Iron: 66 ug/dL (ref 45–160)
TIBC: 306 ug/dL (ref 250–450)
UIBC: 240 ug/dL (ref 125–400)

## 2014-12-03 LAB — MICROALBUMIN / CREATININE URINE RATIO
Creatinine, Urine: 19 mg/dL
Microalb Creat Ratio: 663.2 mg/g — ABNORMAL HIGH (ref 0.0–30.0)
Microalb, Ur: 12.6 mg/dL — ABNORMAL HIGH (ref <2.0)

## 2014-12-03 LAB — VITAMIN D 25 HYDROXY (VIT D DEFICIENCY, FRACTURES): VIT D 25 HYDROXY: 96 ng/mL (ref 30–100)

## 2014-12-03 LAB — TSH: TSH: 0.896 u[IU]/mL (ref 0.350–4.500)

## 2014-12-03 LAB — MAGNESIUM: MAGNESIUM: 1.9 mg/dL (ref 1.5–2.5)

## 2014-12-03 LAB — HEMOGLOBIN A1C
HEMOGLOBIN A1C: 6 % — AB (ref <5.7)
MEAN PLASMA GLUCOSE: 126 mg/dL — AB (ref <117)

## 2014-12-03 LAB — INSULIN, RANDOM: INSULIN: 22.9 u[IU]/mL — AB (ref 2.0–19.6)

## 2014-12-03 LAB — VITAMIN B12: Vitamin B-12: 269 pg/mL (ref 211–911)

## 2014-12-04 ENCOUNTER — Other Ambulatory Visit: Payer: Self-pay | Admitting: Internal Medicine

## 2014-12-04 DIAGNOSIS — N183 Chronic kidney disease, stage 3 (moderate): Secondary | ICD-10-CM

## 2014-12-05 LAB — LEVETIRACETAM LEVEL: KEPPRA (LEVETIRACETAM): 24.3 ug/mL

## 2014-12-20 ENCOUNTER — Other Ambulatory Visit: Payer: Self-pay | Admitting: Internal Medicine

## 2014-12-20 ENCOUNTER — Other Ambulatory Visit: Payer: Self-pay | Admitting: Physician Assistant

## 2015-01-06 ENCOUNTER — Other Ambulatory Visit: Payer: Self-pay | Admitting: *Deleted

## 2015-01-06 MED ORDER — CLOPIDOGREL BISULFATE 75 MG PO TABS: ORAL_TABLET | ORAL | Status: DC

## 2015-01-06 MED ORDER — FUROSEMIDE 40 MG PO TABS: ORAL_TABLET | ORAL | Status: DC

## 2015-01-06 MED ORDER — LEVOTHYROXINE SODIUM 75 MCG PO TABS: 75.0000 ug | ORAL_TABLET | Freq: Every day | ORAL | Status: DC

## 2015-03-07 ENCOUNTER — Ambulatory Visit: Payer: Self-pay | Admitting: Internal Medicine

## 2015-04-02 ENCOUNTER — Other Ambulatory Visit: Payer: Self-pay | Admitting: Internal Medicine

## 2015-04-04 ENCOUNTER — Encounter: Payer: Self-pay | Admitting: Internal Medicine

## 2015-04-04 ENCOUNTER — Ambulatory Visit (INDEPENDENT_AMBULATORY_CARE_PROVIDER_SITE_OTHER): Payer: 59 | Admitting: Internal Medicine

## 2015-04-04 VITALS — BP 172/88 | HR 62 | Temp 98.0°F | Resp 18 | Ht 68.0 in | Wt 194.0 lb

## 2015-04-04 DIAGNOSIS — E039 Hypothyroidism, unspecified: Secondary | ICD-10-CM

## 2015-04-04 DIAGNOSIS — E559 Vitamin D deficiency, unspecified: Secondary | ICD-10-CM

## 2015-04-04 DIAGNOSIS — Z9119 Patient's noncompliance with other medical treatment and regimen: Secondary | ICD-10-CM | POA: Diagnosis not present

## 2015-04-04 DIAGNOSIS — R7303 Prediabetes: Secondary | ICD-10-CM

## 2015-04-04 DIAGNOSIS — I10 Essential (primary) hypertension: Secondary | ICD-10-CM | POA: Diagnosis not present

## 2015-04-04 DIAGNOSIS — Z79899 Other long term (current) drug therapy: Secondary | ICD-10-CM | POA: Diagnosis not present

## 2015-04-04 DIAGNOSIS — E782 Mixed hyperlipidemia: Secondary | ICD-10-CM

## 2015-04-04 DIAGNOSIS — F17200 Nicotine dependence, unspecified, uncomplicated: Secondary | ICD-10-CM | POA: Diagnosis not present

## 2015-04-04 DIAGNOSIS — Z91199 Patient's noncompliance with other medical treatment and regimen due to unspecified reason: Secondary | ICD-10-CM

## 2015-04-04 LAB — LIPID PANEL
CHOL/HDL RATIO: 3.3 ratio (ref <=5.0)
CHOLESTEROL: 140 mg/dL (ref 125–200)
HDL: 42 mg/dL — AB (ref >=46)
LDL CALC: 75 mg/dL (ref <130)
TRIGLYCERIDES: 114 mg/dL (ref <150)
VLDL: 23 mg/dL (ref <30)

## 2015-04-04 LAB — CBC WITH DIFFERENTIAL/PLATELET
BASOS ABS: 0.1 10*3/uL (ref 0.0–0.1)
Basophils Relative: 1 % (ref 0–1)
EOS ABS: 0.3 10*3/uL (ref 0.0–0.7)
EOS PCT: 4 % (ref 0–5)
HEMATOCRIT: 42.3 % (ref 36.0–46.0)
Hemoglobin: 14 g/dL (ref 12.0–15.0)
LYMPHS ABS: 1.3 10*3/uL (ref 0.7–4.0)
LYMPHS PCT: 19 % (ref 12–46)
MCH: 31.1 pg (ref 26.0–34.0)
MCHC: 33.1 g/dL (ref 30.0–36.0)
MCV: 94 fL (ref 78.0–100.0)
MONO ABS: 0.9 10*3/uL (ref 0.1–1.0)
MPV: 9.7 fL (ref 8.6–12.4)
Monocytes Relative: 14 % — ABNORMAL HIGH (ref 3–12)
Neutro Abs: 4.1 10*3/uL (ref 1.7–7.7)
Neutrophils Relative %: 62 % (ref 43–77)
Platelets: 186 10*3/uL (ref 150–400)
RBC: 4.5 MIL/uL (ref 3.87–5.11)
RDW: 14.2 % (ref 11.5–15.5)
WBC: 6.6 10*3/uL (ref 4.0–10.5)

## 2015-04-04 LAB — HEPATIC FUNCTION PANEL
ALBUMIN: 3.8 g/dL (ref 3.6–5.1)
ALK PHOS: 52 U/L (ref 33–130)
ALT: 11 U/L (ref 6–29)
AST: 18 U/L (ref 10–35)
BILIRUBIN TOTAL: 0.3 mg/dL (ref 0.2–1.2)
Bilirubin, Direct: 0.1 mg/dL (ref <=0.2)
Indirect Bilirubin: 0.2 mg/dL (ref 0.2–1.2)
TOTAL PROTEIN: 6.2 g/dL (ref 6.1–8.1)

## 2015-04-04 LAB — BASIC METABOLIC PANEL WITH GFR
BUN: 22 mg/dL (ref 7–25)
CALCIUM: 9.2 mg/dL (ref 8.6–10.4)
CO2: 32 mmol/L — ABNORMAL HIGH (ref 20–31)
CREATININE: 1.62 mg/dL — AB (ref 0.50–1.05)
Chloride: 101 mmol/L (ref 98–110)
GFR, EST AFRICAN AMERICAN: 41 mL/min — AB (ref >=60)
GFR, Est Non African American: 35 mL/min — ABNORMAL LOW (ref >=60)
GLUCOSE: 58 mg/dL — AB (ref 65–99)
POTASSIUM: 3.6 mmol/L (ref 3.5–5.3)
Sodium: 143 mmol/L (ref 135–146)

## 2015-04-04 LAB — HEMOGLOBIN A1C
HEMOGLOBIN A1C: 5.6 % (ref <5.7)
Mean Plasma Glucose: 114 mg/dL (ref <117)

## 2015-04-04 LAB — TSH: TSH: 0.86 mIU/L

## 2015-04-04 NOTE — Progress Notes (Signed)
Patient ID: Olivia Walsh, female   DOB: [birth year], 55 y.o.   MRN: [id number]  Assessment and Plan:  Hypertension:  -increase labatalol to 300 mg twice daily. -Continue medication,  -monitor blood pressure at home.  -Continue DASH diet.   -Reminder to go to the ER if any CP, SOB, nausea, dizziness, severe HA, changes vision/speech, left arm numbness and tingling, and jaw pain.  Cholesterol: -Continue diet and exercise.  -Check cholesterol.   Pre-diabetes: -Continue diet and exercise.  -Check A1C  Vitamin D Def: -check level -continue medications.   Smoking -not ready to quit yet.  Continue diet and meds as discussed. Further disposition pending results of labs.  HPI 55 y.o. female  presents for 3 month follow up with hypertension, hyperlipidemia, prediabetes and vitamin D.   Her blood pressure has not been controlled at home, today their BP is BP: (!) 172/88 mmHg.   She does not workout. She denies chest pain, shortness of breath, dizziness.  She reports that she has been very stressed because work has been Investment banker, operational people off.  She reports that she has some concerns that she might get laid off.     She is on cholesterol medication and denies myalgias. Her cholesterol is not at goal. The cholesterol last visit was:   Lab Results  Component Value Date   CHOL 150 12/02/2014   HDL 38* 12/02/2014   LDLCALC 79 12/02/2014   TRIG 166* 12/02/2014   CHOLHDL 3.9 12/02/2014     She has been working on diet and exercise for prediabetes, and denies foot ulcerations, hyperglycemia, hypoglycemia , increased appetite, nausea, paresthesia of the feet, polydipsia, polyuria, visual disturbances, vomiting and weight loss. Last A1C in the office was:  Lab Results  Component Value Date   HGBA1C 6.0* 12/02/2014    Patient is on Vitamin D supplement.  Lab Results  Component Value Date   VD25OH 96 12/02/2014     She reports that she has not had any recent seizures.  She has been taking the  keppra.    She is smoking a pack per day still.    Current Medications:  Current Outpatient Prescriptions on File Prior to Visit  Medication Sig Dispense Refill  . aspirin (BAYER ASPIRIN) 325 MG tablet Take 1 tablet (325 mg total) by mouth daily. 100 tablet 3  . clopidogrel (PLAVIX) 75 MG tablet TAKE 1 TABLET TWICE A DAY TO PREVENT STROKE 180 tablet 0  . furosemide (LASIX) 40 MG tablet TAKE 1 TABLET TWICE A DAY FOR BLOOD PRESSURE AND FLUID 180 tablet 0  . labetalol (NORMODYNE) 300 MG tablet TAKE 1 TABLET TWICE A DAY 180 tablet 0  . levETIRAcetam (KEPPRA) 500 MG tablet TAKE 1 TABLET TWICE A DAY FOR SEIZURES 180 tablet 1  . levothyroxine (SYNTHROID, LEVOTHROID) 75 MCG tablet TAKE 1 TABLET DAILY 30 tablet 0  . rosuvastatin (CRESTOR) 20 MG tablet TAKE 1 TABLET DAILY FOR CHOLESTEROL 90 tablet 1  . Vitamin D, Ergocalciferol, (DRISDOL) 50000 UNITS CAPS capsule TAKE 1 CAPSULE DAILY FOR SEVERE VITAMIN D DEFICIENCY SECONDARY TO MALABSORPTION 90 capsule 1   No current facility-administered medications on file prior to visit.    Medical History:  Past Medical History  Diagnosis Date  . Vitamin D deficiency     Allergies:  Allergies  Allergen Reactions  . Ace Inhibitors Cough  . Penicillins   . Prednisone     Dysphoria     Review of Systems:  Review of Systems  Constitutional: Negative  for fever, chills and malaise/fatigue.  HENT: Negative for congestion, ear pain and sore throat.   Eyes: Negative.   Respiratory: Negative for cough, shortness of breath and wheezing.   Cardiovascular: Negative for chest pain, palpitations and leg swelling.  Gastrointestinal: Negative for heartburn, diarrhea, constipation, blood in stool and melena.  Genitourinary: Negative.   Neurological: Negative for dizziness, sensory change, seizures, loss of consciousness and headaches.  Psychiatric/Behavioral: Negative for depression. The patient is not nervous/anxious and does not have insomnia.     Family  history- Review and unchanged  Social history- Review and unchanged  Physical Exam: BP 172/88 mmHg  Pulse 62  Temp(Src) 98 F (36.7 C) (Temporal)  Resp 18  Ht 5\' 8"  (1.727 m)  Wt 194 lb (87.998 kg)  BMI 29.50 kg/m2 Wt Readings from Last 3 Encounters:  04/04/15 194 lb (87.998 kg)  12/02/14 196 lb 12.8 oz (89.268 kg)  07/13/14 195 lb (88.451 kg)    General Appearance: Well nourished well developed, in no apparent distress. Eyes: PERRLA, EOMs, conjunctiva no swelling or erythema ENT/Mouth: Ear canals normal without obstruction, swelling, erythma, discharge.  TMs normal bilaterally.  Oropharynx moist, clear, without exudate, or postoropharyngeal swelling. Neck: Supple, thyroid normal,no cervical adenopathy.  Poor dentition Respiratory: Respiratory effort normal, Breath sounds clear A&P without rhonchi, wheeze, or rale.  No retractions, no accessory usage. Cardio: RRR with no MRGs. Brisk peripheral pulses without edema.  Abdomen: Soft, + BS,  Non tender, no guarding, rebound, hernias, masses. Musculoskeletal: Full ROM, 5/5 strength, Normal gait Skin: Warm, dry without rashes, lesions, ecchymosis.  Neuro: Awake and oriented X 3, Cranial nerves intact. Normal muscle tone, no cerebellar symptoms. Psych: Normal affect, Insight and Judgment appropriate.    Starlyn Skeans, PA-C 9:15 AM [facility]

## 2015-04-13 ENCOUNTER — Other Ambulatory Visit: Payer: Self-pay | Admitting: Internal Medicine

## 2015-04-28 ENCOUNTER — Other Ambulatory Visit: Payer: Self-pay | Admitting: Nephrology

## 2015-04-28 DIAGNOSIS — N183 Chronic kidney disease, stage 3 unspecified: Secondary | ICD-10-CM

## 2015-05-05 ENCOUNTER — Other Ambulatory Visit: Payer: Self-pay

## 2015-05-07 ENCOUNTER — Other Ambulatory Visit: Payer: Self-pay | Admitting: Internal Medicine

## 2015-05-10 ENCOUNTER — Ambulatory Visit
Admission: RE | Admit: 2015-05-10 | Discharge: 2015-05-10 | Disposition: A | Discharge status: Home / Self Care | Payer: 59 | Source: Ambulatory Visit | Attending: Nephrology | Admitting: Nephrology

## 2015-05-10 ENCOUNTER — Other Ambulatory Visit: Payer: Self-pay

## 2015-05-10 DIAGNOSIS — N183 Chronic kidney disease, stage 3 unspecified: Secondary | ICD-10-CM

## 2015-06-05 ENCOUNTER — Other Ambulatory Visit: Payer: Self-pay | Admitting: Internal Medicine

## 2015-06-09 ENCOUNTER — Ambulatory Visit: Payer: Self-pay | Admitting: Internal Medicine

## 2015-07-11 ENCOUNTER — Encounter: Payer: Self-pay | Admitting: Internal Medicine

## 2015-07-11 ENCOUNTER — Ambulatory Visit: Payer: 59 | Admitting: Internal Medicine

## 2015-07-11 VITALS — BP 136/78 | HR 56 | Temp 97.2°F | Resp 16 | Ht 68.0 in | Wt 196.8 lb

## 2015-07-18 ENCOUNTER — Ambulatory Visit (INDEPENDENT_AMBULATORY_CARE_PROVIDER_SITE_OTHER): Payer: 59 | Admitting: Internal Medicine

## 2015-07-18 ENCOUNTER — Encounter: Payer: Self-pay | Admitting: Internal Medicine

## 2015-07-18 VITALS — BP 134/62 | HR 56 | Temp 98.2°F | Resp 18 | Ht 68.0 in | Wt 198.0 lb

## 2015-07-18 DIAGNOSIS — N183 Chronic kidney disease, stage 3 (moderate): Secondary | ICD-10-CM | POA: Diagnosis not present

## 2015-07-18 DIAGNOSIS — E039 Hypothyroidism, unspecified: Secondary | ICD-10-CM

## 2015-07-18 DIAGNOSIS — N1832 Chronic kidney disease, stage 3b: Secondary | ICD-10-CM

## 2015-07-18 DIAGNOSIS — Z79899 Other long term (current) drug therapy: Secondary | ICD-10-CM

## 2015-07-18 DIAGNOSIS — E782 Mixed hyperlipidemia: Secondary | ICD-10-CM | POA: Diagnosis not present

## 2015-07-18 DIAGNOSIS — I10 Essential (primary) hypertension: Secondary | ICD-10-CM

## 2015-07-18 DIAGNOSIS — R7303 Prediabetes: Secondary | ICD-10-CM

## 2015-07-18 DIAGNOSIS — E559 Vitamin D deficiency, unspecified: Secondary | ICD-10-CM | POA: Diagnosis not present

## 2015-07-18 NOTE — Progress Notes (Signed)
Patient ID: Olivia Walsh, female   DOB: [birth year], 55 y.o.   MRN: [id number] R  E  S  C  H  E  D  U  L  E  D

## 2015-07-18 NOTE — Progress Notes (Signed)
Assessment and Plan:  Hypertension:  -Continue medication,  -monitor blood pressure at home.  -Continue DASH diet.   -Reminder to go to the ER if any CP, SOB, nausea, dizziness, severe HA, changes vision/speech, left arm numbness and tingling, and jaw pain.  Cholesterol: -Continue diet and exercise.  -Check cholesterol.   Pre-diabetes: -Continue diet and exercise.  -Check A1C  Vitamin D Def: -check level -continue medications.   Tobacco Abuse -continues to smoke -not interested in quitting  Continue diet and meds as discussed. Further disposition pending results of labs.  HPI 55 y.o. female  presents for 3 month follow up with hypertension, hyperlipidemia, prediabetes and vitamin D.   Her blood pressure has been controlled at home, today their BP is BP: 134/62 mmHg.   She does workout. She denies chest pain, shortness of breath, dizziness.   She is on cholesterol medication and denies myalgias. Her cholesterol is at goal. The cholesterol last visit was:   Lab Results  Component Value Date   CHOL 140 04/04/2015   HDL 42* 04/04/2015   LDLCALC 75 04/04/2015   TRIG 114 04/04/2015   CHOLHDL 3.3 04/04/2015     She has been working on diet and exercise for prediabetes, and denies foot ulcerations, hyperglycemia, hypoglycemia , increased appetite, nausea, paresthesia of the feet, polydipsia, polyuria, visual disturbances, vomiting and weight loss. Last A1C in the office was:  Lab Results  Component Value Date   HGBA1C 5.6 04/04/2015    Patient is on Vitamin D supplement.  Lab Results  Component Value Date   VD25OH 96 12/02/2014     She reports that she is having some left leg swelling which is chronic.  She reports that she is wearing compression stockings and is also using a seated pedal bike.  She has not had a seizure since  2012.  She has not missed any doses.    She did go to see nephrology.  She notes that she is due to see them again.    Current Medications:   Current Outpatient Prescriptions on File Prior to Visit  Medication Sig Dispense Refill  . aspirin (BAYER ASPIRIN) 325 MG tablet Take 1 tablet (325 mg total) by mouth daily. 100 tablet 3  . clopidogrel (PLAVIX) 75 MG tablet TAKE 1 TABLET TWICE A DAY TO PREVENT STROKE 180 tablet 0  . furosemide (LASIX) 40 MG tablet TAKE 1 TABLET TWICE A DAY FOR BLOOD PRESSURE AND FLUID 180 tablet 0  . labetalol (NORMODYNE) 300 MG tablet TAKE 1 TABLET TWICE A DAY 180 tablet 0  . levETIRAcetam (KEPPRA) 500 MG tablet TAKE 1 TABLET TWICE A DAY FOR SEIZURES 180 tablet 0  . levothyroxine (SYNTHROID, LEVOTHROID) 75 MCG tablet TAKE 1 TABLET DAILY 90 tablet 0  . rosuvastatin (CRESTOR) 20 MG tablet TAKE 1 TABLET DAILY FOR CHOLESTEROL 90 tablet 0  . Vitamin D, Ergocalciferol, (DRISDOL) 50000 UNITS CAPS capsule TAKE 1 CAPSULE DAILY FOR SEVERE VITAMIN D DEFICIENCY SECONDARY TO MALABSORPTION 90 capsule 1   No current facility-administered medications on file prior to visit.    Medical History:  Past Medical History  Diagnosis Date  . Vitamin D deficiency     Allergies:  Allergies  Allergen Reactions  . Ace Inhibitors Cough  . Penicillins   . Prednisone     Dysphoria     Review of Systems:  Review of Systems  Constitutional: Negative for fever, chills and malaise/fatigue.  HENT: Negative for congestion, ear pain and sore throat.   Eyes:  Negative.   Respiratory: Negative for cough, shortness of breath and wheezing.   Cardiovascular: Negative for chest pain, palpitations and leg swelling.  Gastrointestinal: Negative for heartburn, abdominal pain, diarrhea, constipation, blood in stool and melena.  Genitourinary: Negative.   Skin: Negative.   Neurological: Negative for dizziness, sensory change, loss of consciousness and headaches.  Psychiatric/Behavioral: Negative for depression. The patient is not nervous/anxious and does not have insomnia.     Family history- Review and unchanged  Social history-  Review and unchanged  Physical Exam: BP 134/62 mmHg  Pulse 56  Temp(Src) 98.2 F (36.8 C) (Temporal)  Resp 18  Ht 5\' 8"  (1.727 m)  Wt 198 lb (89.812 kg)  BMI 30.11 kg/m2 Wt Readings from Last 3 Encounters:  07/18/15 198 lb (89.812 kg)  07/11/15 196 lb 12.8 oz (89.268 kg)  04/04/15 194 lb (87.998 kg)    General Appearance: Appears older than stated age. Well nourished well developed, in no apparent distress. Eyes: PERRLA, EOMs, conjunctiva no swelling or erythema ENT/Mouth: Ear canals normal without obstruction, swelling, erythma, discharge.  TMs normal bilaterally.  Oropharynx moist, clear, without exudate, or postoropharyngeal swelling. Neck: Supple, thyroid normal,no cervical adenopathy  Respiratory: Respiratory effort normal, Breath sounds clear A&P without rhonchi, wheeze, or rale.  No retractions, no accessory usage. Cardio: RRR with no MRGs. Brisk peripheral pulses without edema.  Abdomen: Soft, + BS,  Non tender, no guarding, rebound, hernias, masses. Musculoskeletal: Full ROM, 5/5 strength, Normal gait Skin: Warm, dry without rashes, lesions, ecchymosis.  Neuro: Awake and oriented X 3, Cranial nerves intact. Normal muscle tone, no cerebellar symptoms. Psych: Normal affect, Insight and Judgment appropriate.    Starlyn Skeans, PA-C 4:20 PM [facility]

## 2015-07-19 LAB — CBC WITH DIFFERENTIAL/PLATELET
BASOS ABS: 52 {cells}/uL (ref 0–200)
Basophils Relative: 1 %
EOS ABS: 208 {cells}/uL (ref 15–500)
Eosinophils Relative: 4 %
HCT: 40.8 % (ref 35.0–45.0)
Hemoglobin: 13.4 g/dL (ref 11.7–15.5)
LYMPHS ABS: 1508 {cells}/uL (ref 850–3900)
LYMPHS PCT: 29 %
MCH: 31.2 pg (ref 27.0–33.0)
MCHC: 32.8 g/dL (ref 32.0–36.0)
MCV: 95.1 fL (ref 80.0–100.0)
MONOS PCT: 9 %
MPV: 10 fL (ref 7.5–12.5)
Monocytes Absolute: 468 cells/uL (ref 200–950)
NEUTROS ABS: 2964 {cells}/uL (ref 1500–7800)
NEUTROS PCT: 57 %
PLATELETS: 199 10*3/uL (ref 140–400)
RBC: 4.29 MIL/uL (ref 3.80–5.10)
RDW: 14.3 % (ref 11.0–15.0)
WBC: 5.2 10*3/uL (ref 3.8–10.8)

## 2015-07-19 LAB — BASIC METABOLIC PANEL WITH GFR
BUN: 22 mg/dL (ref 7–25)
CHLORIDE: 98 mmol/L (ref 98–110)
CO2: 31 mmol/L (ref 20–31)
CREATININE: 1.7 mg/dL — AB (ref 0.50–1.05)
Calcium: 9.4 mg/dL (ref 8.6–10.4)
GFR, Est African American: 38 mL/min — ABNORMAL LOW (ref >=60)
GFR, Est Non African American: 33 mL/min — ABNORMAL LOW (ref >=60)
Glucose, Bld: 83 mg/dL (ref 65–99)
Potassium: 3.7 mmol/L (ref 3.5–5.3)
Sodium: 141 mmol/L (ref 135–146)

## 2015-07-19 LAB — LIPID PANEL
CHOL/HDL RATIO: 4.1 ratio (ref <=5.0)
CHOLESTEROL: 155 mg/dL (ref 125–200)
HDL: 38 mg/dL — ABNORMAL LOW (ref >=46)
LDL CALC: 78 mg/dL (ref <130)
TRIGLYCERIDES: 197 mg/dL — AB (ref <150)
VLDL: 39 mg/dL — AB (ref <30)

## 2015-07-19 LAB — HEMOGLOBIN A1C
Hgb A1c MFr Bld: 5.8 % — ABNORMAL HIGH (ref <5.7)
Mean Plasma Glucose: 120 mg/dL

## 2015-07-19 LAB — HEPATIC FUNCTION PANEL
ALT: 11 U/L (ref 6–29)
AST: 18 U/L (ref 10–35)
Albumin: 4 g/dL (ref 3.6–5.1)
Alkaline Phosphatase: 51 U/L (ref 33–130)
BILIRUBIN DIRECT: 0.1 mg/dL (ref <=0.2)
BILIRUBIN INDIRECT: 0.2 mg/dL (ref 0.2–1.2)
BILIRUBIN TOTAL: 0.3 mg/dL (ref 0.2–1.2)
Total Protein: 6.2 g/dL (ref 6.1–8.1)

## 2015-07-19 LAB — TSH: TSH: 0.96 mIU/L

## 2015-07-23 ENCOUNTER — Other Ambulatory Visit: Payer: Self-pay | Admitting: Internal Medicine

## 2015-07-23 DIAGNOSIS — G458 Other transient cerebral ischemic attacks and related syndromes: Secondary | ICD-10-CM

## 2015-07-23 DIAGNOSIS — E039 Hypothyroidism, unspecified: Secondary | ICD-10-CM

## 2015-08-24 ENCOUNTER — Other Ambulatory Visit: Payer: Self-pay | Admitting: Physician Assistant

## 2015-08-24 DIAGNOSIS — I10 Essential (primary) hypertension: Secondary | ICD-10-CM

## 2015-08-24 DIAGNOSIS — R569 Unspecified convulsions: Secondary | ICD-10-CM

## 2015-08-24 DIAGNOSIS — E785 Hyperlipidemia, unspecified: Secondary | ICD-10-CM

## 2015-11-15 ENCOUNTER — Encounter: Payer: Self-pay | Admitting: Internal Medicine

## 2015-12-06 ENCOUNTER — Encounter: Payer: Self-pay | Admitting: Internal Medicine

## 2015-12-20 ENCOUNTER — Encounter: Payer: Self-pay | Admitting: Internal Medicine

## 2015-12-20 ENCOUNTER — Ambulatory Visit (INDEPENDENT_AMBULATORY_CARE_PROVIDER_SITE_OTHER): Payer: 59 | Admitting: Internal Medicine

## 2015-12-20 VITALS — BP 118/64 | HR 56 | Temp 97.8°F | Resp 16 | Ht 68.0 in | Wt 200.0 lb

## 2015-12-20 DIAGNOSIS — I10 Essential (primary) hypertension: Secondary | ICD-10-CM

## 2015-12-20 DIAGNOSIS — E559 Vitamin D deficiency, unspecified: Secondary | ICD-10-CM

## 2015-12-20 DIAGNOSIS — Z136 Encounter for screening for cardiovascular disorders: Secondary | ICD-10-CM

## 2015-12-20 DIAGNOSIS — E039 Hypothyroidism, unspecified: Secondary | ICD-10-CM

## 2015-12-20 DIAGNOSIS — I2510 Atherosclerotic heart disease of native coronary artery without angina pectoris: Secondary | ICD-10-CM

## 2015-12-20 DIAGNOSIS — F17200 Nicotine dependence, unspecified, uncomplicated: Secondary | ICD-10-CM

## 2015-12-20 DIAGNOSIS — I739 Peripheral vascular disease, unspecified: Secondary | ICD-10-CM

## 2015-12-20 DIAGNOSIS — I6350 Cerebral infarction due to unspecified occlusion or stenosis of unspecified cerebral artery: Secondary | ICD-10-CM

## 2015-12-20 DIAGNOSIS — N1832 Chronic kidney disease, stage 3b: Secondary | ICD-10-CM

## 2015-12-20 DIAGNOSIS — Z0000 Encounter for general adult medical examination without abnormal findings: Secondary | ICD-10-CM | POA: Diagnosis not present

## 2015-12-20 DIAGNOSIS — Z23 Encounter for immunization: Secondary | ICD-10-CM

## 2015-12-20 DIAGNOSIS — N183 Chronic kidney disease, stage 3 (moderate): Secondary | ICD-10-CM

## 2015-12-20 DIAGNOSIS — E782 Mixed hyperlipidemia: Secondary | ICD-10-CM

## 2015-12-20 DIAGNOSIS — Z0001 Encounter for general adult medical examination with abnormal findings: Secondary | ICD-10-CM

## 2015-12-20 DIAGNOSIS — Z130 Encounter for screening for diseases of the blood and blood-forming organs and certain disorders involving the immune mechanism: Secondary | ICD-10-CM

## 2015-12-20 DIAGNOSIS — R7303 Prediabetes: Secondary | ICD-10-CM

## 2015-12-20 DIAGNOSIS — G40909 Epilepsy, unspecified, not intractable, without status epilepticus: Secondary | ICD-10-CM

## 2015-12-20 DIAGNOSIS — Z79899 Other long term (current) drug therapy: Secondary | ICD-10-CM

## 2015-12-20 LAB — CBC WITH DIFFERENTIAL/PLATELET
BASOS ABS: 52 {cells}/uL (ref 0–200)
Basophils Relative: 1 %
EOS ABS: 208 {cells}/uL (ref 15–500)
EOS PCT: 4 %
HCT: 40 % (ref 35.0–45.0)
HEMOGLOBIN: 13.2 g/dL (ref 11.7–15.5)
LYMPHS ABS: 1144 {cells}/uL (ref 850–3900)
Lymphocytes Relative: 22 %
MCH: 31.1 pg (ref 27.0–33.0)
MCHC: 33 g/dL (ref 32.0–36.0)
MCV: 94.1 fL (ref 80.0–100.0)
MPV: 10.4 fL (ref 7.5–12.5)
Monocytes Absolute: 624 cells/uL (ref 200–950)
Monocytes Relative: 12 %
NEUTROS ABS: 3172 {cells}/uL (ref 1500–7800)
NEUTROS PCT: 61 %
Platelets: 190 10*3/uL (ref 140–400)
RBC: 4.25 MIL/uL (ref 3.80–5.10)
RDW: 13.9 % (ref 11.0–15.0)
WBC: 5.2 10*3/uL (ref 3.8–10.8)

## 2015-12-20 LAB — BASIC METABOLIC PANEL WITH GFR
BUN: 20 mg/dL (ref 7–25)
CALCIUM: 9.4 mg/dL (ref 8.6–10.4)
CHLORIDE: 102 mmol/L (ref 98–110)
CO2: 29 mmol/L (ref 20–31)
CREATININE: 1.81 mg/dL — AB (ref 0.50–1.05)
GFR, Est African American: 35 mL/min — ABNORMAL LOW (ref >=60)
GFR, Est Non African American: 31 mL/min — ABNORMAL LOW (ref >=60)
GLUCOSE: 81 mg/dL (ref 65–99)
Potassium: 3.7 mmol/L (ref 3.5–5.3)
SODIUM: 141 mmol/L (ref 135–146)

## 2015-12-20 LAB — HEPATIC FUNCTION PANEL
ALBUMIN: 3.9 g/dL (ref 3.6–5.1)
ALT: 13 U/L (ref 6–29)
AST: 18 U/L (ref 10–35)
Alkaline Phosphatase: 47 U/L (ref 33–130)
Bilirubin, Direct: 0.1 mg/dL (ref <=0.2)
Indirect Bilirubin: 0.3 mg/dL (ref 0.2–1.2)
TOTAL PROTEIN: 6.2 g/dL (ref 6.1–8.1)
Total Bilirubin: 0.4 mg/dL (ref 0.2–1.2)

## 2015-12-20 LAB — VITAMIN B12: Vitamin B-12: 264 pg/mL (ref 200–1100)

## 2015-12-20 LAB — LIPID PANEL
CHOL/HDL RATIO: 3.6 ratio (ref <=5.0)
CHOLESTEROL: 147 mg/dL (ref 125–200)
HDL: 41 mg/dL — ABNORMAL LOW (ref >=46)
LDL Cholesterol: 77 mg/dL (ref <130)
Triglycerides: 145 mg/dL (ref <150)
VLDL: 29 mg/dL (ref <30)

## 2015-12-20 LAB — IRON AND TIBC
%SAT: 29 % (ref 11–50)
IRON: 79 ug/dL (ref 45–160)
TIBC: 271 ug/dL (ref 250–450)
UIBC: 192 ug/dL (ref 125–400)

## 2015-12-20 LAB — TSH: TSH: 1.5 m[IU]/L

## 2015-12-20 LAB — MAGNESIUM: MAGNESIUM: 2 mg/dL (ref 1.5–2.5)

## 2015-12-20 NOTE — Patient Instructions (Signed)
Please call [employer] about the cologuard coverage for screening for colon cancer.

## 2015-12-20 NOTE — Progress Notes (Signed)
Complete Physical  Assessment and Plan:   1. Need for prophylactic vaccination and inoculation against influenza -due next year - Flu Vaccine QUAD with presevative  2. Cerebral artery occlusion with cerebral infarction ([place]) -recommended quitting smoking -is taking asa daily -cont cholesterol meds -cont plavix  3. Atherosclerosis of native coronary artery without angina pectoris, unspecified whether native or transplanted heart -has not followed with cards recently -cont plavix -cont ASA  4. Essential hypertension -cont meds -cont dash diet -well controlled currently - Urinalysis, Routine w reflex microscopic (not at [facility]) - Microalbumin / creatinine urine ratio - EKG 12-Lead - TSH  5. Peripheral vascular disease ([place]) -cont meds -cont exercise as tolerated -recommended quitting smoking -not interested in quitting  6. Hypothyroidism, unspecified type -TSH -levothyroxine  7. Seizure disorder ([place]) -cont meds -secondary to stroke -recommended quitting smoking  8. Stage 3 CKD (GFR 40 ml/min)  -followed by nephrology  9. Hyperlipidemia -cont statin - Lipid panel  10. Prediabetes -cont diet and exercise - Hemoglobin A1c - Insulin, random  11. Vitamin D deficiency -cont Vit D - VITAMIN D 25 Hydroxy (Vit-D Deficiency, Fractures)   12. Medication management -due at least bi yearly  13. Encounter for general adult medical examination with abnormal findings  - CBC with Differential/Platelet - BASIC METABOLIC PANEL WITH GFR - Hepatic function panel - Magnesium  14. Smoker -not ready to quit -offered chantix which patient declined - DG Chest 2 View; Future  15. Screening for deficiency anemia  - Iron and TIBC - Vitamin B12  Patient is interested in doing cologuard.  She was instructed to call [place] about coverage for this testing.  If no coverage will schedule with GI.  She was told to contact [facility] imaging for her mammogram at the breast  center as she is overdue.  She did refuse a pap smear today in the office but is willing do it at next [place].    Discussed med's effects and SE's. Screening labs and tests as requested with regular follow-up as recommended.  HPI  55 y.o. female  presents for a complete physical.  Her blood pressure has been controlled at home, today their BP is BP: 118/64.  She does not workout. She denies chest pain, shortness of breath, dizziness. She did go to see her renal Dr. And they restarted her on Cozaar.  She is doing well on this.  It was originally stopped secondary to dry cough.  She reports that she has not had the coughing since restarting it.    She is on cholesterol medication and denies myalgias. Her cholesterol is not at goal. The cholesterol last visit was:  Lab Results  Component Value Date   CHOL 155 07/18/2015   HDL 38 (L) 07/18/2015   LDLCALC 78 07/18/2015   TRIG 197 (H) 07/18/2015   CHOLHDL 4.1 07/18/2015  .  She has been working on diet and exercise for prediabetes, she is on bASA, she is on ACE/ARB and denies foot ulcerations, hyperglycemia, hypoglycemia , increased appetite, nausea, paresthesia of the feet, polydipsia, polyuria, visual disturbances, vomiting and weight loss. Last A1C in the office was:  Lab Results  Component Value Date   HGBA1C 5.8 (H) 07/18/2015    Patient is on Vitamin D supplement.   Lab Results  Component Value Date   VD25OH 96 12/02/2014     She reports that she is still smoking.  She is not having severe shortness of breath.  She is not having chest pains.  She  is not having significant bruising or bleeding issues.  She is taking goody powders with food.  She reports that she is not having any stomach issues.    She is smoking a pack per day currently.  She is not interested in quitting.    She does not see obgyn.  She needs to have a pap smear.  She is not wanting to do a pap smear today.    Current Medications:  Current Outpatient Prescriptions  on File Prior to Visit  Medication Sig Dispense Refill  . clopidogrel (PLAVIX) 75 MG tablet TAKE 1 TABLET TWICE A DAY TO PREVENT STROKE 180 tablet 1  . furosemide (LASIX) 40 MG tablet TAKE 1 TABLET TWICE A DAY FOR BLOOD PRESSURE AND FLUID 180 tablet 0  . labetalol (NORMODYNE) 300 MG tablet TAKE 1 TABLET TWICE A DAY 180 tablet 1  . levETIRAcetam (KEPPRA) 500 MG tablet TAKE 1 TABLET TWICE A DAY FOR SEIZURES 180 tablet 1  . levothyroxine (SYNTHROID, LEVOTHROID) 75 MCG tablet TAKE 1 TABLET DAILY 90 tablet 1  . rosuvastatin (CRESTOR) 20 MG tablet TAKE 1 TABLET DAILY FOR CHOLESTEROL 90 tablet 1  . Vitamin D, Ergocalciferol, (DRISDOL) 50000 UNITS CAPS capsule TAKE 1 CAPSULE DAILY FOR SEVERE VITAMIN D DEFICIENCY SECONDARY TO MALABSORPTION 90 capsule 1   No current facility-administered medications on file prior to visit.     Health Maintenance:   Immunization History  Administered Date(s) Administered  . Influenza Split 12/02/2014  . Influenza,inj,quad, With Preservative 12/20/2015  . PPD Test 10/09/2013, 12/02/2014  . Td 10/24/2006    Tetanus: 2008 Flu vaccine: 2016 Pap: Due MGM: Due DEXA: Not indicated Colonoscopy: Due  Patient Care Team: [name], MD as PCP - General (Internal Medicine)  Allergies:  Allergies  Allergen Reactions  . Ace Inhibitors Cough  . Penicillins   . Prednisone     Dysphoria    Medical History:  Past Medical History:  Diagnosis Date  . Vitamin D deficiency     Surgical History:  Past Surgical History:  Procedure Laterality Date  . CORONARY ARTERY BYPASS GRAFT  2002   Dr Cyndia Bent  . EMBOLIZATION  2005   Embolization of right PICA aneurysm    Family History:  Family History  Problem Relation Age of Onset  . Heart disease Mother   . Cancer Father     brain  . Leukemia Sister   . Heart disease Brother     Social History:  Social History  Substance Use Topics  . Smoking status: Current Every Day Smoker    Packs/day: 1.00    Years:  40.00    Types: Cigarettes  . Smokeless tobacco: Never Used     Comment: 1/2-2 ppd   . Alcohol use No    Review of Systems: Review of Systems  Constitutional: Negative for chills, fever and malaise/fatigue.  HENT: Negative for congestion, ear pain and sore throat.   Eyes: Negative.   Respiratory: Negative for cough, shortness of breath and wheezing.   Cardiovascular: Negative for chest pain, palpitations and leg swelling.  Gastrointestinal: Negative for abdominal pain, blood in stool, constipation, diarrhea, heartburn and melena.  Genitourinary: Negative.   Skin: Negative.   Neurological: Negative for dizziness, sensory change, loss of consciousness and headaches.  Psychiatric/Behavioral: Negative for depression. The patient is not nervous/anxious and does not have insomnia.     Physical Exam: Estimated body mass index is 30.41 kg/m as calculated from the following:   Height as of this encounter: 5'  8" (1.727 m).   Weight as of this encounter: 200 lb (90.7 kg). BP 118/64   Pulse (!) 56   Temp 97.8 F (36.6 C) (Temporal)   Resp 16   Ht 5\' 8"  (1.727 m)   Wt 200 lb (90.7 kg)   BMI 30.41 kg/m   General Appearance: Well nourished well developed, in no apparent distress.  Eyes: PERRLA, EOMs, conjunctiva no swelling or erythema ENT/Mouth: Ear canals normal without obstruction, swelling, erythema, or discharge.  TMs normal bilaterally with no erythema, bulging, retraction, or loss of landmark.  Oropharynx moist and clear with no exudate, erythema, or swelling.   Neck: Supple, thyroid normal. No bruits.  No cervical adenopathy Respiratory: Respiratory effort normal, Breath sounds clear A&P without wheeze, rhonchi, rales.   Cardio: RRR without murmurs, rubs or gallops. Brisk peripheral pulses without edema.  Chest: symmetric, with normal excursions Breasts: Symmetric, without lumps, nipple discharge, retractions.  Abdomen: Soft, nontender, no guarding, rebound, hernias, masses, or  organomegaly.  Lymphatics: Non tender without lymphadenopathy.  Genitourinary:  Musculoskeletal: Full ROM all peripheral extremities,5/5 strength, and normal gait.  Skin: Warm, dry without rashes, lesions, ecchymosis. Neuro: Awake and oriented X 3, Cranial nerves intact, reflexes equal bilaterally. Normal muscle tone, no cerebellar symptoms. Sensation intact.  Psych:  normal affect, Insight and Judgment appropriate.   EKG: WNL no changes.   Over 40 minutes of exam, counseling, chart review and critical decision making was performed  Starlyn Skeans 9:21 AM [facility]

## 2015-12-21 LAB — VITAMIN D 25 HYDROXY (VIT D DEFICIENCY, FRACTURES): VIT D 25 HYDROXY: 69 ng/mL (ref 30–100)

## 2015-12-21 LAB — URINALYSIS, ROUTINE W REFLEX MICROSCOPIC
BILIRUBIN URINE: NEGATIVE
Glucose, UA: NEGATIVE
Hgb urine dipstick: NEGATIVE
Ketones, ur: NEGATIVE
Leukocytes, UA: NEGATIVE
Nitrite: NEGATIVE
PH: 7 (ref 5.0–8.0)
Protein, ur: NEGATIVE
SPECIFIC GRAVITY, URINE: 1.007 (ref 1.001–1.035)

## 2015-12-21 LAB — MICROALBUMIN / CREATININE URINE RATIO
CREATININE, URINE: 24 mg/dL (ref 20–320)
MICROALB UR: 3 mg/dL
Microalb Creat Ratio: 125 mcg/mg creat — ABNORMAL HIGH (ref <30)

## 2015-12-21 LAB — INSULIN, RANDOM: INSULIN: 5.9 u[IU]/mL (ref 2.0–19.6)

## 2015-12-21 LAB — HEMOGLOBIN A1C
HEMOGLOBIN A1C: 5.3 % (ref <5.7)
MEAN PLASMA GLUCOSE: 105 mg/dL

## 2015-12-31 ENCOUNTER — Other Ambulatory Visit: Payer: Self-pay | Admitting: Physician Assistant

## 2015-12-31 ENCOUNTER — Other Ambulatory Visit: Payer: Self-pay | Admitting: Internal Medicine

## 2015-12-31 DIAGNOSIS — G458 Other transient cerebral ischemic attacks and related syndromes: Secondary | ICD-10-CM

## 2015-12-31 DIAGNOSIS — E039 Hypothyroidism, unspecified: Secondary | ICD-10-CM

## 2016-02-02 ENCOUNTER — Other Ambulatory Visit: Payer: Self-pay | Admitting: Internal Medicine

## 2016-02-02 DIAGNOSIS — E785 Hyperlipidemia, unspecified: Secondary | ICD-10-CM

## 2016-02-02 DIAGNOSIS — I10 Essential (primary) hypertension: Secondary | ICD-10-CM

## 2016-02-02 DIAGNOSIS — R569 Unspecified convulsions: Secondary | ICD-10-CM

## 2016-02-10 ENCOUNTER — Encounter: Payer: Self-pay | Admitting: Internal Medicine

## 2016-02-28 DIAGNOSIS — J069 Acute upper respiratory infection, unspecified: Secondary | ICD-10-CM | POA: Diagnosis not present

## 2016-02-29 ENCOUNTER — Encounter: Payer: Self-pay | Admitting: Internal Medicine

## 2016-02-29 ENCOUNTER — Other Ambulatory Visit: Payer: Self-pay | Admitting: Internal Medicine

## 2016-02-29 MED ORDER — DEXAMETHASONE 0.75 MG PO TABS: 0.7500 mg | ORAL_TABLET | Freq: Two times a day (BID) | ORAL | 0 refills | Status: DC

## 2016-02-29 MED ORDER — FLUTICASONE PROPIONATE 50 MCG/ACT NA SUSP: 2.0000 | Freq: Every day | NASAL | 0 refills | Status: DC

## 2016-02-29 MED ORDER — BENZONATATE 200 MG PO CAPS: 200.0000 mg | ORAL_CAPSULE | Freq: Three times a day (TID) | ORAL | 0 refills | Status: DC | PRN

## 2016-03-08 ENCOUNTER — Other Ambulatory Visit: Payer: Self-pay | Admitting: Internal Medicine

## 2016-03-16 ENCOUNTER — Encounter: Payer: Self-pay | Admitting: Internal Medicine

## 2016-03-25 ENCOUNTER — Other Ambulatory Visit: Payer: Self-pay | Admitting: Internal Medicine

## 2016-03-25 DIAGNOSIS — G458 Other transient cerebral ischemic attacks and related syndromes: Secondary | ICD-10-CM

## 2016-03-30 ENCOUNTER — Encounter: Payer: Self-pay | Admitting: Internal Medicine

## 2016-04-05 DIAGNOSIS — N183 Chronic kidney disease, stage 3 (moderate): Secondary | ICD-10-CM | POA: Diagnosis not present

## 2016-04-14 ENCOUNTER — Other Ambulatory Visit: Payer: Self-pay | Admitting: Internal Medicine

## 2016-06-18 NOTE — Progress Notes (Deleted)
Assessment and Plan:  Hypertension:  -Continue medication,  -monitor blood pressure at home.  -Continue DASH diet.   -Reminder to go to the ER if any CP, SOB, nausea, dizziness, severe HA, changes vision/speech, left arm numbness and tingling, and jaw pain.  Cholesterol: -Continue diet and exercise.  -Check cholesterol.   Pre-diabetes: -Continue diet and exercise.  -Check A1C  Vitamin D Def: -check level -continue medications.   Tobacco Abuse -continues to smoke -not interested in quitting  Continue diet and meds as discussed. Further disposition pending results of labs.  HPI 56 y.o. female  presents for 3 month follow up with hypertension, hyperlipidemia, prediabetes and vitamin D.   Her blood pressure has been controlled at home, today their BP is  .   She does workout. She denies chest pain, shortness of breath, dizziness.   She is on cholesterol medication and denies myalgias. Her cholesterol is at goal. The cholesterol last visit was:   Lab Results  Component Value Date   CHOL 147 12/20/2015   HDL 41 (L) 12/20/2015   LDLCALC 77 12/20/2015   TRIG 145 12/20/2015   CHOLHDL 3.6 12/20/2015     She has been working on diet and exercise for prediabetes, and denies foot ulcerations, hyperglycemia, hypoglycemia , increased appetite, nausea, paresthesia of the feet, polydipsia, polyuria, visual disturbances, vomiting and weight loss. Last A1C in the office was:  Lab Results  Component Value Date   HGBA1C 5.3 12/20/2015    Patient is on Vitamin D supplement.  Lab Results  Component Value Date   VD25OH 69 12/20/2015     She reports that she is having some left leg swelling which is chronic.  She reports that she is wearing compression stockings and is also using a seated pedal bike.  She has not had a seizure since  2012.  She has not missed any doses.    She did go to see nephrology.  She notes that she is due to see them again.    Current Medications:  Current  Outpatient Prescriptions on File Prior to Visit  Medication Sig Dispense Refill  . benzonatate (TESSALON) 200 MG capsule Take 1 capsule (200 mg total) by mouth 3 (three) times daily as needed for cough. 20 capsule 0  . clopidogrel (PLAVIX) 75 MG tablet TAKE 1 TABLET TWICE A DAY TO PREVENT STROKE 180 tablet 0  . dexamethasone (DECADRON) 0.75 MG tablet Take 1 tablet (0.75 mg total) by mouth 2 (two) times daily with a meal. 10 tablet 0  . fluticasone (FLONASE) 50 MCG/ACT nasal spray Place 2 sprays into both nostrils daily. 16 g 0  . furosemide (LASIX) 40 MG tablet TAKE 1 TABLET TWICE A DAY FOR BLOOD PRESSURE AND FLUID 60 tablet 0  . labetalol (NORMODYNE) 300 MG tablet TAKE 1 TABLET TWICE A DAY 180 tablet 0  . levETIRAcetam (KEPPRA) 500 MG tablet TAKE 1 TABLET TWICE A DAY FOR SEIZURES 180 tablet 0  . levothyroxine (SYNTHROID, LEVOTHROID) 75 MCG tablet TAKE 1 TABLET DAILY 90 tablet 1  . losartan (COZAAR) 50 MG tablet Take 50 mg by mouth daily.    . rosuvastatin (CRESTOR) 20 MG tablet TAKE 1 TABLET DAILY FOR CHOLESTEROL 90 tablet 0  . Vitamin D, Ergocalciferol, (DRISDOL) 50000 UNITS CAPS capsule TAKE 1 CAPSULE DAILY FOR SEVERE VITAMIN D DEFICIENCY SECONDARY TO MALABSORPTION 90 capsule 1   No current facility-administered medications on file prior to visit.     Medical History:  Past Medical History:  Diagnosis  Date  . Vitamin D deficiency     Allergies:  Allergies  Allergen Reactions  . Ace Inhibitors Cough  . Penicillins   . Prednisone     Dysphoria     Review of Systems:  Review of Systems  Constitutional: Negative for chills, fever and malaise/fatigue.  HENT: Negative for congestion, ear pain and sore throat.   Eyes: Negative.   Respiratory: Negative for cough, shortness of breath and wheezing.   Cardiovascular: Negative for chest pain, palpitations and leg swelling.  Gastrointestinal: Negative for abdominal pain, blood in stool, constipation, diarrhea, heartburn and melena.   Genitourinary: Negative.   Skin: Negative.   Neurological: Negative for dizziness, sensory change, loss of consciousness and headaches.  Psychiatric/Behavioral: Negative for depression. The patient is not nervous/anxious and does not have insomnia.     Family history- Review and unchanged  Social history- Review and unchanged  Physical Exam: There were no vitals taken for this visit. Wt Readings from Last 3 Encounters:  12/20/15 200 lb (90.7 kg)  07/18/15 198 lb (89.8 kg)  07/11/15 196 lb 12.8 oz (89.3 kg)    General Appearance: Appears older than stated age. Well nourished well developed, in no apparent distress. Eyes: PERRLA, EOMs, conjunctiva no swelling or erythema ENT/Mouth: Ear canals normal without obstruction, swelling, erythma, discharge.  TMs normal bilaterally.  Oropharynx moist, clear, without exudate, or postoropharyngeal swelling. Neck: Supple, thyroid normal,no cervical adenopathy  Respiratory: Respiratory effort normal, Breath sounds clear A&P without rhonchi, wheeze, or rale.  No retractions, no accessory usage. Cardio: RRR with no MRGs. Brisk peripheral pulses without edema.  Abdomen: Soft, + BS,  Non tender, no guarding, rebound, hernias, masses. Musculoskeletal: Full ROM, 5/5 strength, Normal gait Skin: Warm, dry without rashes, lesions, ecchymosis.  Neuro: Awake and oriented X 3, Cranial nerves intact. Normal muscle tone, no cerebellar symptoms. Psych: Normal affect, Insight and Judgment appropriate.    Vicie Mutters, PA-C 7:31 AM [facility]

## 2016-06-19 ENCOUNTER — Ambulatory Visit: Payer: Self-pay | Admitting: Internal Medicine

## 2016-06-19 ENCOUNTER — Ambulatory Visit: Payer: Self-pay | Admitting: Physician Assistant

## 2016-06-20 ENCOUNTER — Other Ambulatory Visit: Payer: Self-pay | Admitting: Internal Medicine

## 2016-06-20 DIAGNOSIS — R569 Unspecified convulsions: Secondary | ICD-10-CM

## 2016-06-20 DIAGNOSIS — E785 Hyperlipidemia, unspecified: Secondary | ICD-10-CM

## 2016-06-25 ENCOUNTER — Other Ambulatory Visit: Payer: Self-pay | Admitting: Internal Medicine

## 2016-06-25 DIAGNOSIS — G458 Other transient cerebral ischemic attacks and related syndromes: Secondary | ICD-10-CM

## 2016-06-25 DIAGNOSIS — E039 Hypothyroidism, unspecified: Secondary | ICD-10-CM

## 2016-06-26 NOTE — Progress Notes (Signed)
Assessment and Plan:  Hypertension:  -Continue medication,  -monitor blood pressure at home.  -Continue DASH diet.   -Reminder to go to the ER if any CP, SOB, nausea, dizziness, severe HA, changes vision/speech, left arm numbness and tingling, and jaw pain.  Cholesterol: -Continue diet and exercise.  -Check cholesterol.   Pre-diabetes: -Continue diet and exercise.  -Check A1C  Vitamin D Def: -check level -continue medications.   Tobacco Abuse -continues to smoke -not interested in quitting  Peripheral vascular disease ([place]) -     Ambulatory referral to Cardiology  Poor compliance Noncompliance- emphasized compliance with medications and appointments, patient expressed understanding  Medication management -     Magnesium -     Levetiracetam level  Dizziness, SOB, edema History of AAA via echo 2015 with moderate AS/MR, reevaluate for any changes Advised to quit smoking -     Ambulatory referral to Cardiology   Continue diet and meds as discussed. Further disposition pending results of labs. Future Appointments Date Time Provider [place]  12/19/2016 9:00 AM Starlyn Skeans, PA-C [facility] None    HPI 56 y.o. female  presents for 3 month follow up with hypertension, hyperlipidemia, prediabetes and vitamin D.   Her blood pressure has been controlled at home, today their BP is BP: 110/76.   She does workout. She denies chest pain.  She has had some mild dizziness, some SOB.  She reports that she is having some left leg swelling which is chronic.  She reports that she is wearing compression stockings and is also using a seated pedal bike.   She is on cholesterol medication and denies myalgias. Her cholesterol is at goal. The cholesterol last visit was:   Lab Results  Component Value Date   CHOL 147 12/20/2015   HDL 41 (L) 12/20/2015   LDLCALC 77 12/20/2015   TRIG 145 12/20/2015   CHOLHDL 3.6 12/20/2015    She has been working on diet and exercise for  prediabetes, and denies foot ulcerations, hyperglycemia, hypoglycemia , increased appetite, nausea, paresthesia of the feet, polydipsia, polyuria, visual disturbances, vomiting and weight loss. Last A1C in the office was:  Lab Results  Component Value Date   HGBA1C 5.3 12/20/2015   She follows with nephrology, still on losartan and lasix at this time.   Lab Results  Component Value Date   GFRNONAA 31 (L) 12/20/2015   Patient is on Vitamin D supplement.  Lab Results  Component Value Date   VD25OH 59 12/20/2015     She has not had a seizure since  2012.  She has not missed any doses.     She is on thyroid medication. Her medication was not changed last visit.   Lab Results  Component Value Date   TSH 1.50 12/20/2015  .     Current Medications:  Current Outpatient Prescriptions on File Prior to Visit  Medication Sig Dispense Refill  . clopidogrel (PLAVIX) 75 MG tablet TAKE 1 TABLET TWICE A DAY TO PREVENT STROKE 60 tablet 0  . furosemide (LASIX) 40 MG tablet TAKE 1 TABLET TWICE A DAY FOR BLOOD PRESSURE AND FLUID 60 tablet 0  . labetalol (NORMODYNE) 300 MG tablet TAKE 1 TABLET TWICE A DAY 180 tablet 0  . levETIRAcetam (KEPPRA) 500 MG tablet TAKE 1 TABLET TWICE A DAY FOR SEIZURES 180 tablet 0  . levothyroxine (SYNTHROID, LEVOTHROID) 75 MCG tablet TAKE 1 TABLET DAILY 30 tablet 0  . losartan (COZAAR) 50 MG tablet Take 50 mg by mouth daily.    Marland Kitchen  rosuvastatin (CRESTOR) 20 MG tablet TAKE 1 TABLET DAILY FOR CHOLESTEROL 90 tablet 0  . Vitamin D, Ergocalciferol, (DRISDOL) 50000 UNITS CAPS capsule TAKE 1 CAPSULE DAILY FOR SEVERE VITAMIN D DEFICIENCY SECONDARY TO MALABSORPTION 90 capsule 1   No current facility-administered medications on file prior to visit.     Medical History:  Past Medical History:  Diagnosis Date  . Vitamin D deficiency     Allergies:  Allergies  Allergen Reactions  . Ace Inhibitors Cough  . Penicillins   . Prednisone     Dysphoria     Review of Systems:   Review of Systems  Constitutional: Positive for malaise/fatigue. Negative for chills and fever.  HENT: Negative for congestion, ear pain and sore throat.   Eyes: Negative.   Respiratory: Positive for shortness of breath. Negative for cough and wheezing.   Cardiovascular: Positive for leg swelling. Negative for chest pain, palpitations, orthopnea, claudication and PND.  Gastrointestinal: Negative for abdominal pain, blood in stool, constipation, diarrhea, heartburn and melena.  Genitourinary: Negative.   Skin: Negative.   Neurological: Negative for dizziness, sensory change, loss of consciousness and headaches.  Psychiatric/Behavioral: Negative for depression. The patient is not nervous/anxious and does not have insomnia.     Family history- Review and unchanged  Social history- Review and unchanged  Physical Exam: BP 110/76   Pulse 72   Temp 97.7 F (36.5 C)   Resp 16   Ht 5\' 8"  (1.727 m)   Wt 193 lb 3.2 oz (87.6 kg)   SpO2 95%   BMI 29.38 kg/m  Wt Readings from Last 3 Encounters:  06/27/16 193 lb 3.2 oz (87.6 kg)  12/20/15 200 lb (90.7 kg)  07/18/15 198 lb (89.8 kg)    General Appearance: Appears older than stated age. Well nourished well developed, in no apparent distress. Eyes: PERRLA, EOMs, conjunctiva no swelling or erythema ENT/Mouth: Ear canals normal without obstruction, swelling, erythma, discharge.  TMs normal bilaterally.  Oropharynx moist, clear, without exudate, or postoropharyngeal swelling. Neck: Supple, thyroid normal,no cervical adenopathy  Respiratory: Respiratory effort normal, Breath sounds clear A&P without rhonchi, wheeze, or rale.  No retractions, no accessory usage. Cardio: RRR with with holosystolic murmur, worse systolic, likely combo of AS and MR with radiation to the carotids Brisk peripheral pulses with bilateral edema, left worse than right unchanged.  Abdomen: Soft, + BS,  Non tender, no guarding, rebound, hernias, masses. Musculoskeletal:  Full ROM, 5/5 strength, Normal gait Skin: Warm, dry without rashes, lesions, ecchymosis.  Neuro: Awake and oriented X 3, Cranial nerves intact. Normal muscle tone, no cerebellar symptoms. Psych: Normal affect, Insight and Judgment appropriate.    Vicie Mutters, PA-C 3:47 PM [facility]

## 2016-06-27 ENCOUNTER — Encounter: Payer: Self-pay | Admitting: Physician Assistant

## 2016-06-27 ENCOUNTER — Ambulatory Visit (INDEPENDENT_AMBULATORY_CARE_PROVIDER_SITE_OTHER): Payer: 59 | Admitting: Physician Assistant

## 2016-06-27 VITALS — BP 110/76 | HR 72 | Temp 97.7°F | Resp 16 | Ht 68.0 in | Wt 193.2 lb

## 2016-06-27 DIAGNOSIS — I739 Peripheral vascular disease, unspecified: Secondary | ICD-10-CM

## 2016-06-27 DIAGNOSIS — Z91199 Patient's noncompliance with other medical treatment and regimen due to unspecified reason: Secondary | ICD-10-CM

## 2016-06-27 DIAGNOSIS — E559 Vitamin D deficiency, unspecified: Secondary | ICD-10-CM

## 2016-06-27 DIAGNOSIS — E782 Mixed hyperlipidemia: Secondary | ICD-10-CM | POA: Diagnosis not present

## 2016-06-27 DIAGNOSIS — E039 Hypothyroidism, unspecified: Secondary | ICD-10-CM | POA: Diagnosis not present

## 2016-06-27 DIAGNOSIS — N183 Chronic kidney disease, stage 3 (moderate): Secondary | ICD-10-CM

## 2016-06-27 DIAGNOSIS — I10 Essential (primary) hypertension: Secondary | ICD-10-CM | POA: Diagnosis not present

## 2016-06-27 DIAGNOSIS — R42 Dizziness and giddiness: Secondary | ICD-10-CM | POA: Diagnosis not present

## 2016-06-27 DIAGNOSIS — F17200 Nicotine dependence, unspecified, uncomplicated: Secondary | ICD-10-CM

## 2016-06-27 DIAGNOSIS — Z9119 Patient's noncompliance with other medical treatment and regimen: Secondary | ICD-10-CM

## 2016-06-27 DIAGNOSIS — N1832 Chronic kidney disease, stage 3b: Secondary | ICD-10-CM

## 2016-06-27 DIAGNOSIS — Z79899 Other long term (current) drug therapy: Secondary | ICD-10-CM | POA: Diagnosis not present

## 2016-06-27 LAB — CBC WITH DIFFERENTIAL/PLATELET
Basophils Absolute: 70 cells/uL (ref 0–200)
Basophils Relative: 1 %
EOS PCT: 4 %
Eosinophils Absolute: 280 cells/uL (ref 15–500)
HCT: 37.1 % (ref 35.0–45.0)
HEMOGLOBIN: 12.1 g/dL (ref 11.7–15.5)
LYMPHS ABS: 1820 {cells}/uL (ref 850–3900)
Lymphocytes Relative: 26 %
MCH: 31.4 pg (ref 27.0–33.0)
MCHC: 32.6 g/dL (ref 32.0–36.0)
MCV: 96.4 fL (ref 80.0–100.0)
MONOS PCT: 10 %
MPV: 9.8 fL (ref 7.5–12.5)
Monocytes Absolute: 700 cells/uL (ref 200–950)
NEUTROS PCT: 59 %
Neutro Abs: 4130 cells/uL (ref 1500–7800)
PLATELETS: 181 10*3/uL (ref 140–400)
RBC: 3.85 MIL/uL (ref 3.80–5.10)
RDW: 13.8 % (ref 11.0–15.0)
WBC: 7 10*3/uL (ref 3.8–10.8)

## 2016-06-27 LAB — TSH: TSH: 0.89 mIU/L

## 2016-06-27 NOTE — Patient Instructions (Signed)
Aortic Valve Stenosis Aortic valve stenosis is a narrowing of the aortic valve. The aortic valve opens and closes to regulate blood flow between the lower left chamber of the heart (left ventricle) and the blood vessel that leads away from the heart (aorta). When the aortic valve becomes narrow, it makes it difficult for the heart to pump blood into the aorta, which causes the heart to work harder. The extra work can weaken the heart over time. Aortic valve stenosis can range from mild to severe. If untreated, it can become more severe over time and can lead to heart failure. What are the causes? This condition may be caused by:  Buildup of calcium around and on the valve. This can occur with aging. This is the most common cause of aortic valve stenosis.  Birth defect.  Rheumatic fever.  Radiation to the chest. What increases the risk? You may be more likely to develop this condition if:  You are over the age of 65.  You were born with an abnormal bicuspid valve. What are the signs or symptoms? You may have no symptoms until your condition becomes severe. It may take 10-20 years for mild or moderate aortic valve stenosis to become severe. Symptoms may include:  Shortness of breath. This may get worse during physical activity.  Feeling unusually weak and tired (fatigue).  Extreme discomfort in the chest, neck, or arm (angina).  A heartbeat that is irregular or faster than normal (palpitations).  Dizziness or fainting. This may happen when you get physically tired or after you take certain heart medicines, such as nitroglycerin. How is this diagnosed? This condition may be diagnosed with:  A physical exam.  Echocardiogram. This is a type of imaging test that uses sound waves (ultrasound) to make an image of your heart. There are two types that may be used:  Transthoracic echocardiogram (TTE). This type of echocardiogram is noninvasive, and it is usually done  first.  Transesophageal echocardiogram (TEE). This type of echocardiogram is done by passing a flexible tube down your esophagus. The heart and the esophagus are close to each other, so your health care provider can take very clear, detailed pictures of the heart using this type of test.  Cardiac catheterization. In this procedure, a thin, flexible tube (catheter) is passed through a large vein in your neck, groin, or arm. This procedure provides information about arteries, structures, blood pressure, and oxygen levels in your heart.  Electrocardiogram (ECG). This records the electrical impulses of your heart and assesses heart function.  Stress tests. These are tests that evaluate the blood supply to your heart and your heart's response to exercise.  Blood tests. You may work with a health care provider who specializes in the heart (cardiologist). How is this treated? Treatment depends on how severe your condition is and what your symptoms are. You will need to have your heart checked regularly to make sure that your condition is not getting worse or causing serious problems. If your condition is mild, no treatment may be needed. Treatment may include:  Medicines that help keep your heart rate regular.  Medicines that thin your blood (anticoagulants) to prevent the formation of blood clots.  Antibiotic medicines to help prevent infection.  Surgery to replace your aortic valve. This is the most common treatment for aortic valve stenosis. Several types of surgeries are available. The surgery may be done through a large incision over your heart (open heart surgery), or it may be done using a minimally   invasive technique (transcatheter aortic valve replacement, or TAVR). Follow these instructions at home: Lifestyle    Limit alcohol intake to no more than 1 drink per day for nonpregnant women and 2 drinks per day for men. One drink equals 12 oz of beer, 5 oz of wine, or 1 oz of hard  liquor.  Do not use any tobacco products, such as cigarettes, chewing tobacco, or e-cigarettes. If you need help quitting, ask your health care provider.  Work with your health care provider to manage your blood pressure and cholesterol.  Maintain a healthy weight. Eating and drinking   Follow instructions from your health care provider about eating or drinking restrictions.  Limit how much caffeine you drink. Caffeine can affect your heart's rate and rhythm.  Drink enough fluid to keep your urine clear or pale yellow.  Eat a heart-healthy diet. This should include plenty of fresh fruits and vegetables. If you eat meat, it should be lean cuts. Avoid foods that are:  High in salt, saturated fat, or sugar.  Canned or highly processed.  Fried. Activity   Return to your normal activities as told by your health care provider. Ask your health care provider what activities are safe for you.  Exercise regularly, as told by your health care provider. Ask your health care provider what types of exercise are safe for you.  If your aortic valve stenosis is mild, you may need to avoid only very intense physical activity. The more severe your aortic valve stenosis is, the more activities you may need to avoid. General instructions   Take over-the-counter and prescription medicines only as told by your health care provider.  If you are a woman and you plan to become pregnant, talk with your health care provider before you become pregnant.  Tell all health care providers who care for you that you have aortic valve stenosis.  Keep all follow-up visits as told by your health care provider. This is important. Contact a health care provider if:  You have a fever. Get help right away if:  You develop chest pain or tightness.  You develop shortness of breath or difficulty breathing.  You feel light-headed.  You feel like you might faint.  Your heartbeat is irregular or faster than  normal. These symptoms may represent a serious problem that is an emergency. Do not wait to see if the symptoms will go away. Get medical help right away. Call your local emergency services (911 in the [place] Do not drive yourself to the hospital. This information is not intended to replace advice given to you by your health care provider. Make sure you discuss any questions you have with your health care provider. Document Released: 11/11/2002 Document Revised: 07/21/2015 Document Reviewed: 01/16/2015 Elsevier Interactive Patient Education  2017 [employer]  

## 2016-06-28 LAB — HEPATIC FUNCTION PANEL
ALBUMIN: 4 g/dL (ref 3.6–5.1)
ALT: 7 U/L (ref 6–29)
AST: 13 U/L (ref 10–35)
Alkaline Phosphatase: 49 U/L (ref 33–130)
BILIRUBIN DIRECT: 0.1 mg/dL (ref <=0.2)
BILIRUBIN TOTAL: 0.3 mg/dL (ref 0.2–1.2)
Indirect Bilirubin: 0.2 mg/dL (ref 0.2–1.2)
Total Protein: 6.5 g/dL (ref 6.1–8.1)

## 2016-06-28 LAB — LIPID PANEL
CHOLESTEROL: 160 mg/dL (ref <200)
HDL: 35 mg/dL — AB (ref >50)
LDL Cholesterol: 84 mg/dL (ref <100)
TRIGLYCERIDES: 207 mg/dL — AB (ref <150)
Total CHOL/HDL Ratio: 4.6 Ratio (ref <5.0)
VLDL: 41 mg/dL — AB (ref <30)

## 2016-06-28 LAB — MAGNESIUM: Magnesium: 1.9 mg/dL (ref 1.5–2.5)

## 2016-06-28 LAB — BASIC METABOLIC PANEL WITH GFR
BUN: 25 mg/dL (ref 7–25)
CALCIUM: 9.1 mg/dL (ref 8.6–10.4)
CHLORIDE: 101 mmol/L (ref 98–110)
CO2: 26 mmol/L (ref 20–31)
Creat: 1.95 mg/dL — ABNORMAL HIGH (ref 0.50–1.05)
GFR, EST AFRICAN AMERICAN: 32 mL/min — AB (ref >=60)
GFR, Est Non African American: 28 mL/min — ABNORMAL LOW (ref >=60)
GLUCOSE: 83 mg/dL (ref 65–99)
POTASSIUM: 3.8 mmol/L (ref 3.5–5.3)
Sodium: 139 mmol/L (ref 135–146)

## 2016-06-28 LAB — VITAMIN D 25 HYDROXY (VIT D DEFICIENCY, FRACTURES): Vit D, 25-Hydroxy: 103 ng/mL — ABNORMAL HIGH (ref 30–100)

## 2016-06-30 LAB — LEVETIRACETAM LEVEL: KEPPRA (LEVETIRACETAM): 28.1 ug/mL

## 2016-07-11 ENCOUNTER — Encounter: Payer: Self-pay | Admitting: *Deleted

## 2016-07-31 ENCOUNTER — Ambulatory Visit: Payer: 59 | Admitting: Cardiovascular Disease

## 2016-08-05 ENCOUNTER — Other Ambulatory Visit: Payer: Self-pay | Admitting: Internal Medicine

## 2016-08-05 DIAGNOSIS — I10 Essential (primary) hypertension: Secondary | ICD-10-CM

## 2016-08-24 ENCOUNTER — Other Ambulatory Visit: Payer: Self-pay | Admitting: Internal Medicine

## 2016-08-24 DIAGNOSIS — E039 Hypothyroidism, unspecified: Secondary | ICD-10-CM

## 2016-08-24 DIAGNOSIS — G458 Other transient cerebral ischemic attacks and related syndromes: Secondary | ICD-10-CM

## 2016-08-27 ENCOUNTER — Telehealth: Payer: Self-pay | Admitting: Physician Assistant

## 2016-08-27 DIAGNOSIS — G458 Other transient cerebral ischemic attacks and related syndromes: Secondary | ICD-10-CM

## 2016-08-27 MED ORDER — CLOPIDOGREL BISULFATE 75 MG PO TABS: 75.0000 mg | ORAL_TABLET | Freq: Once | ORAL | 3 refills | Status: AC

## 2016-08-27 NOTE — Telephone Encounter (Signed)
Will send in 90 day just once a day plavix

## 2016-09-04 ENCOUNTER — Other Ambulatory Visit: Payer: Self-pay

## 2016-09-04 MED ORDER — CLOPIDOGREL BISULFATE 75 MG PO TABS: 75.0000 mg | ORAL_TABLET | Freq: Every day | ORAL | 0 refills | Status: DC

## 2016-09-04 MED ORDER — CLOPIDOGREL BISULFATE 75 MG PO TABS: ORAL_TABLET | ORAL | 1 refills | Status: DC

## 2016-09-09 ENCOUNTER — Other Ambulatory Visit: Payer: Self-pay | Admitting: Internal Medicine

## 2016-09-09 DIAGNOSIS — R569 Unspecified convulsions: Secondary | ICD-10-CM

## 2016-09-09 DIAGNOSIS — E785 Hyperlipidemia, unspecified: Secondary | ICD-10-CM

## 2016-09-15 ENCOUNTER — Other Ambulatory Visit: Payer: Self-pay | Admitting: Internal Medicine

## 2016-09-26 ENCOUNTER — Ambulatory Visit (INDEPENDENT_AMBULATORY_CARE_PROVIDER_SITE_OTHER): Payer: 59 | Admitting: Internal Medicine

## 2016-09-26 ENCOUNTER — Encounter: Payer: Self-pay | Admitting: Internal Medicine

## 2016-09-26 VITALS — BP 104/64 | HR 60 | Temp 97.8°F | Resp 18 | Ht 68.0 in | Wt 190.8 lb

## 2016-09-26 DIAGNOSIS — Z79899 Other long term (current) drug therapy: Secondary | ICD-10-CM | POA: Diagnosis not present

## 2016-09-26 DIAGNOSIS — E782 Mixed hyperlipidemia: Secondary | ICD-10-CM

## 2016-09-26 DIAGNOSIS — G40909 Epilepsy, unspecified, not intractable, without status epilepticus: Secondary | ICD-10-CM | POA: Diagnosis not present

## 2016-09-26 DIAGNOSIS — I10 Essential (primary) hypertension: Secondary | ICD-10-CM | POA: Diagnosis not present

## 2016-09-26 DIAGNOSIS — I739 Peripheral vascular disease, unspecified: Secondary | ICD-10-CM | POA: Diagnosis not present

## 2016-09-26 DIAGNOSIS — E559 Vitamin D deficiency, unspecified: Secondary | ICD-10-CM

## 2016-09-26 DIAGNOSIS — E039 Hypothyroidism, unspecified: Secondary | ICD-10-CM

## 2016-09-26 DIAGNOSIS — R7303 Prediabetes: Secondary | ICD-10-CM

## 2016-09-26 LAB — CBC WITH DIFFERENTIAL/PLATELET
BASOS ABS: 0 {cells}/uL (ref 0–200)
BASOS PCT: 0 %
EOS PCT: 3 %
Eosinophils Absolute: 180 cells/uL (ref 15–500)
HCT: 35.3 % (ref 35.0–45.0)
Hemoglobin: 11.5 g/dL — ABNORMAL LOW (ref 11.7–15.5)
Lymphocytes Relative: 27 %
Lymphs Abs: 1620 cells/uL (ref 850–3900)
MCH: 31.3 pg (ref 27.0–33.0)
MCHC: 32.6 g/dL (ref 32.0–36.0)
MCV: 96.2 fL (ref 80.0–100.0)
MONOS PCT: 10 %
MPV: 9.5 fL (ref 7.5–12.5)
Monocytes Absolute: 600 cells/uL (ref 200–950)
NEUTROS ABS: 3600 {cells}/uL (ref 1500–7800)
Neutrophils Relative %: 60 %
PLATELETS: 174 10*3/uL (ref 140–400)
RBC: 3.67 MIL/uL — ABNORMAL LOW (ref 3.80–5.10)
RDW: 13.5 % (ref 11.0–15.0)
WBC: 6 10*3/uL (ref 3.8–10.8)

## 2016-09-26 NOTE — Patient Instructions (Signed)

## 2016-09-27 LAB — LIPID PANEL
CHOL/HDL RATIO: 4.3 ratio (ref <5.0)
Cholesterol: 146 mg/dL (ref <200)
HDL: 34 mg/dL — AB (ref >50)
LDL Cholesterol: 82 mg/dL (ref <100)
TRIGLYCERIDES: 149 mg/dL (ref <150)
VLDL: 30 mg/dL (ref <30)

## 2016-09-27 LAB — INSULIN, RANDOM: Insulin: 15.2 u[IU]/mL (ref 2.0–19.6)

## 2016-09-27 LAB — HEPATIC FUNCTION PANEL
ALT: 7 U/L (ref 6–29)
AST: 13 U/L (ref 10–35)
Albumin: 3.9 g/dL (ref 3.6–5.1)
Alkaline Phosphatase: 45 U/L (ref 33–130)
BILIRUBIN DIRECT: 0 mg/dL (ref <=0.2)
BILIRUBIN TOTAL: 0.3 mg/dL (ref 0.2–1.2)
Indirect Bilirubin: 0.3 mg/dL (ref 0.2–1.2)
Total Protein: 5.9 g/dL — ABNORMAL LOW (ref 6.1–8.1)

## 2016-09-27 LAB — BASIC METABOLIC PANEL WITH GFR
BUN: 22 mg/dL (ref 7–25)
CO2: 26 mmol/L (ref 20–31)
CREATININE: 1.96 mg/dL — AB (ref 0.50–1.05)
Calcium: 8.5 mg/dL — ABNORMAL LOW (ref 8.6–10.4)
Chloride: 101 mmol/L (ref 98–110)
GFR, EST AFRICAN AMERICAN: 32 mL/min — AB (ref >=60)
GFR, Est Non African American: 28 mL/min — ABNORMAL LOW (ref >=60)
Glucose, Bld: 89 mg/dL (ref 65–99)
POTASSIUM: 3.7 mmol/L (ref 3.5–5.3)
SODIUM: 139 mmol/L (ref 135–146)

## 2016-09-27 LAB — MAGNESIUM: MAGNESIUM: 1.9 mg/dL (ref 1.5–2.5)

## 2016-09-27 LAB — VITAMIN D 25 HYDROXY (VIT D DEFICIENCY, FRACTURES): VIT D 25 HYDROXY: 78 ng/mL (ref 30–100)

## 2016-09-27 LAB — HEMOGLOBIN A1C
HEMOGLOBIN A1C: 5.3 % (ref <5.7)
MEAN PLASMA GLUCOSE: 105 mg/dL

## 2016-09-27 LAB — TSH: TSH: 0.73 mIU/L

## 2016-09-29 LAB — LEVETIRACETAM LEVEL: KEPPRA (LEVETIRACETAM): 33 ug/mL

## 2016-09-30 ENCOUNTER — Encounter: Payer: Self-pay | Admitting: Internal Medicine

## 2016-09-30 NOTE — Progress Notes (Signed)
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 Patient has hx/o Seizure Disorder (last was in 2008).      Patient is treated for HTN since age 23 in 78. & BP has been controlled at home. Today's BP: 104/64. In 2002 at age 59, she underwent emergent CABG.  Patient has had no complaints of any cardiac type chest pain, palpitations, dyspnea/orthopnea/PND, dizziness, claudication, or dependent edema. She continues to smoke heavily.      she has hospitalized at [facility] In 2005 with a Gage from cerebral aneurysms and had a stent placed and in 2008 & 2012 she had coiling of 2 more cerebral aneurysms. Patient also is followed by Dr Kellie Simmering for ASPVD / caudication.     Hyperlipidemia is controlled with diet & meds. Patient denies myalgias or other med SE's. Last Lipids were  Lab Results  Component Value Date   CHOL 146 09/26/2016   HDL 34 (L) 09/26/2016   LDLCALC 82 09/26/2016   TRIG 149 09/26/2016   CHOLHDL 4.3 09/26/2016      Also, the patient has history of PreDiabetes (A1c 6.1% in 2015)  and has had no symptoms of reactive hypoglycemia, diabetic polys, paresthesias or visual blurring.  Last A1c was at goal: Lab Results  Component Value Date   HGBA1C 5.3 09/26/2016      Patient has hx/o thyroid replacement since 2000. Further, the patient also has history of Vitamin D Deficiency ("39" in 2008 and "19" in 2010) and supplements vitamin D without any suspected side-effects. Last vitamin D was at goal: Lab Results  Component Value Date   VD25OH 78 09/26/2016   Current Outpatient Prescriptions on File Prior to Visit  Medication Sig  . PLAVIX 75 MG tablet TAKE ONE TAB TWICE A DAY  . furosemide  40 MG tablet TAKE 1 TAB TWICE A DAY   . labetalol  300 MG tablet TAKE 1 TAB TWICE A DAY  . KEPPRA 500 MG tablet TAKE 1 TAB TWICE A DAY  . levothyroxine 75 MCG tablet TAKE 1 TAB DAILY  .  losartan  50 MG tablet Take  daily.  . rosuvastatin  20 MG tablet TAKE 1 TAB DAILY  . Vitamin D 50,000 UNITS  TAKE 1 CAPSULE DAILY    Allergies  Allergen Reactions  . Ace Inhibitors Cough  . Penicillins   . Prednisone     Dysphoria   PMHx:   Past Medical History:  Diagnosis Date  . Vitamin D deficiency    Immunization History  Administered Date(s) Administered  . Influenza Split 12/02/2014  . Influenza,inj,quad, With Preservative 12/20/2015  . PPD Test 10/09/2013, 12/02/2014  . Td 10/24/2006   Past Surgical History:  Procedure Laterality Date  . CORONARY ARTERY BYPASS GRAFT  2002   Dr Cyndia Bent  . EMBOLIZATION  2005   Embolization of right PICA aneurysm   FHx:    Reviewed / unchanged  SHx:    Reviewed / unchanged  Systems Review:  Constitutional: Denies fever, chills, wt changes, headaches, insomnia, fatigue, night sweats, change in appetite. Eyes: Denies redness, blurred vision, diplopia, discharge, itchy, watery eyes.  ENT: Denies discharge, congestion, post nasal drip, epistaxis, sore throat, earache, hearing loss, dental pain, tinnitus, vertigo, sinus pain, snoring.  CV: Denies chest pain, palpitations, irregular heartbeat, syncope, dyspnea, diaphoresis, orthopnea, PND, claudication or edema. Respiratory: denies cough, dyspnea, DOE, pleurisy, hoarseness, laryngitis, wheezing.  Gastrointestinal: Denies dysphagia, odynophagia, heartburn, reflux, water brash, abdominal pain or cramps, nausea, vomiting, bloating, diarrhea, constipation, hematemesis, melena, hematochezia  or hemorrhoids. Genitourinary: Denies dysuria, frequency, urgency, nocturia, hesitancy, discharge, hematuria or flank pain. Musculoskeletal: Denies arthralgias, myalgias, stiffness, jt. swelling, pain, limping or strain/sprain.  Skin: Denies pruritus, rash, hives, warts, acne, eczema or change in skin lesion(s). Neuro: No weakness, tremor, incoordination, spasms, paresthesia or pain. Psychiatric: Denies  confusion, memory loss or sensory loss. Endo: Denies change in weight, skin or hair change.  Heme/Lymph: No excessive bleeding, bruising or enlarged lymph nodes.  Physical Exam  BP 104/64   Pulse 60   Temp 97.8 F (36.6 C)   Resp 18   Ht 5\' 8"  (1.727 m)   Wt 190 lb 12.8 oz (86.5 kg)   BMI 29.01 kg/m   Appears well nourished, well groomed  and in no distress.  Eyes: PERRLA, EOMs, conjunctiva no swelling or erythema. Sinuses: No frontal/maxillary tenderness ENT/Mouth: EAC's clear, TM's nl w/o erythema, bulging. Nares clear w/o erythema, swelling, exudates. Oropharynx clear without erythema or exudates. Oral hygiene is good. Tongue normal, non obstructing. Hearing intact.  Neck: Supple. Thyroid nl. Car 2+/2+ without bruits, nodes or JVD. Chest: Respirations nl with BS clear & equal w/o rales, rhonchi, wheezing or stridor.  Cor: Heart sounds normal w/ regular rate and rhythm without sig. murmurs, gallops, clicks or rubs. Peripheral pulses 1+/1+ with trace ankle/pedal edema.  Abdomen: Soft & bowel sounds normal. Non-tender w/o guarding, rebound, hernias, masses or organomegaly.  Lymphatics: Unremarkable.  Musculoskeletal: Full ROM all peripheral extremities, joint stability, 5/5 strength and normal gait.  Skin: Warm, dry without exposed rashes, lesions or ecchymosis apparent.  Neuro: Cranial nerves intact, reflexes equal bilaterally. Sensory-motor testing grossly intact. Tendon reflexes grossly intact.  Pysch: Alert & oriented x 3.  Insight and judgement nl & appropriate. No ideations.  Assessment and Plan:  1. Essential hypertension  - Continue medication, monitor blood pressure at home.  - Continue DASH diet. Reminder to go to the ER if any CP,  SOB, nausea, dizziness, severe HA, changes vision/speech.  - CBC with Differential/Platelet - BASIC METABOLIC PANEL WITH GFR - Magnesium - TSH  2. Hyperlipidemia, mixed  - Continue diet/meds, exercise,& lifestyle modifications.  -  Continue monitor periodic cholesterol/liver & renal functions   - Hepatic function panel - Lipid panel - TSH  3. Prediabetes  - Continue diet, exercise, lifestyle modifications.  - Monitor appropriate labs.  - Hemoglobin A1c - Insulin, random  4. Vitamin D deficiency  - Continue supplementation.       - VITAMIN D 25 Hydroxy  5. Hypothyroidism  - TSH  6. Peripheral vascular disease ([place])   7. Seizure disorder ([place])   8. Medication management  - CBC with Differential/Platelet - BASIC METABOLIC PANEL WITH GFR - Hepatic function panel - Magnesium - Lipid panel - TSH - Hemoglobin A1c - Insulin, random - VITAMIN D 25 Hydroxy  - Levetiracetam level      Discussed  regular exercise, BP monitoring, weight control to achieve/maintain BMI less than 25 and discussed med and SE's. Recommended labs to assess and monitor clinical status with further disposition pending results of labs. Over 30 minutes of exam, counseling, chart review was performed.

## 2016-10-01 DIAGNOSIS — N183 Chronic kidney disease, stage 3 (moderate): Secondary | ICD-10-CM | POA: Diagnosis not present

## 2016-10-01 DIAGNOSIS — N140 Analgesic nephropathy: Secondary | ICD-10-CM | POA: Diagnosis not present

## 2016-10-01 DIAGNOSIS — I129 Hypertensive chronic kidney disease with stage 1 through stage 4 chronic kidney disease, or unspecified chronic kidney disease: Secondary | ICD-10-CM | POA: Diagnosis not present

## 2016-10-03 ENCOUNTER — Other Ambulatory Visit: Payer: Self-pay | Admitting: Physician Assistant

## 2016-10-03 DIAGNOSIS — E039 Hypothyroidism, unspecified: Secondary | ICD-10-CM

## 2016-10-12 ENCOUNTER — Ambulatory Visit (INDEPENDENT_AMBULATORY_CARE_PROVIDER_SITE_OTHER): Payer: 59 | Admitting: Cardiovascular Disease

## 2016-10-12 ENCOUNTER — Encounter: Payer: Self-pay | Admitting: Cardiovascular Disease

## 2016-10-12 VITALS — BP 112/68 | HR 56 | Ht 68.0 in | Wt 190.2 lb

## 2016-10-12 DIAGNOSIS — I2510 Atherosclerotic heart disease of native coronary artery without angina pectoris: Secondary | ICD-10-CM | POA: Diagnosis not present

## 2016-10-12 DIAGNOSIS — E782 Mixed hyperlipidemia: Secondary | ICD-10-CM | POA: Diagnosis not present

## 2016-10-12 NOTE — Progress Notes (Signed)
Olivia Walsh Date of Birth  10-Jul-1960       [facility]    [employer] [address], [place], [place]  [address]   [place], [place]  [address] [place]   Fax  [phone]     Fax [phone]  Problem List: 1. Coronary artery disease-status post coronary artery bypass grafting in 2000 2. Hyperlipidemia 3. Left drop foot. 4.   CKD - baseline Cr = 1.96.      History of Present Illness:  Olivia Walsh is a 55 yo who I met approximately 15 years ago.  She has seen Dr. Johnsie Cancel 3-4 years ago. She does not have any specific complaints - her medical doctor told her that the needed to come in to be seen.  Still smokes - does not want to quit.   1 ppd.    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 17, 2018:  Olivia Walsh is seen back after 3 year absence.     Hx of CAD / CABG.   Hyperlipidemia , HTN, heavy smoker for years   No CP or dyspnea   Still smoking - 1 ppd     Current Outpatient Prescriptions on File Prior to Visit  Medication Sig Dispense Refill  . clopidogrel (PLAVIX) 75 MG tablet TAKE ONE TABLET BY MOUTH TWICE A DAY 30 tablet 0  . furosemide (LASIX) 40 MG tablet TAKE 1 TABLET TWICE A DAY FOR BLOOD PRESSURE AND FLUID 60 tablet 0  . labetalol (NORMODYNE) 300 MG tablet TAKE 1 TABLET TWICE A DAY 180 tablet 0  . levETIRAcetam (KEPPRA) 500 MG tablet TAKE 1 TABLET TWICE A DAY FOR SEIZURES 180 tablet 0  . levothyroxine (SYNTHROID, LEVOTHROID) 75 MCG tablet TAKE 1 TABLET DAILY 90 tablet 0  . losartan (COZAAR) 50 MG tablet Take 50 mg by mouth daily.    . rosuvastatin (CRESTOR) 20 MG tablet TAKE 1 TABLET DAILY FOR CHOLESTEROL 90 tablet 0  . Vitamin D, Ergocalciferol, (DRISDOL) 50000 UNITS CAPS capsule TAKE 1 CAPSULE DAILY FOR SEVERE VITAMIN D DEFICIENCY SECONDARY TO MALABSORPTION 90 capsule 1   No  current facility-administered medications on file prior to visit.     Allergies  Allergen Reactions  . Ace Inhibitors Cough  . Penicillins   . Prednisone     Dysphoria    Past Medical History:  Diagnosis Date  . Vitamin D deficiency     Past Surgical History:  Procedure Laterality Date  . CORONARY ARTERY BYPASS GRAFT  2002   Dr Cyndia Bent  . EMBOLIZATION  2005   Embolization of right PICA aneurysm    History  Smoking Status  . Current Every Day Smoker  . Packs/day: 1.00  . Years: 40.00  . Types: Cigarettes  Smokeless Tobacco  . Never Used    Comment: 1/2-2 ppd     History  Alcohol Use No    Family History  Problem Relation Age of Onset  . Cancer Father        brain  . Leukemia Sister   . Heart disease Mother   . Heart disease Brother     Reviw of Systems:  Reviewed in the HPI.  All other systems are negative.  Physical Exam: Blood pressure 112/68, pulse (!) 56, height '5\' 8"'  (1.727 m),  weight 190 lb 3.2 oz (86.3 kg), SpO2 98 %. Wt Readings from Last 3 Encounters:  10/12/16 190 lb 3.2 oz (86.3 kg)  09/26/16 190 lb 12.8 oz (86.5 kg)  06/27/16 193 lb 3.2 oz (87.6 kg)     General: Well developed, well nourished, in no acute distress.  Head: Normocephalic, atraumatic, sclera non-icteric, mucus membranes are moist,   Neck: Supple. Carotids are 2 + without bruits. No JVD   Lungs: Clear   Heart: RR, 2/6 systolic murmur , pulses are 2+   Abdomen: Soft, non-tender, non-distended with normal bowel sounds.  Msk:  Strength and tone are normal   Extremities: No clubbing or cyanosis. She has 1+ edema in her left leg.    Distal pedal pulses are 2+ and equal   Neuro: CN II - XII intact.  Alert and oriented X 3.   Psych:  Normal   ECG: Aug. 17, 2018:  NSR LVH with repol. Persistent ST elevation in III  ( old)   Assessment / Plan:   1.  CAD :  Hx of premature CAD.   Lipids are fairly well controlled.    Still smoking .   Encouraged her to stop  Has no  desire to stop at this point  No angina.   Continue current meds.  Will see her in 1 year   2.  Hyperlipidemia:  On Crestor 20 mg a day . LDL is 82.   3.  Subarachnoid hemorrhage:   4. Cerebral aneurisms   Mertie Moores, MD  10/12/2016 1:51 PM    [address],  [place], [place]  [address] Pager [phone] Phone: [phone]; Fax: [phone]

## 2016-10-12 NOTE — Patient Instructions (Signed)

## 2016-10-27 ENCOUNTER — Other Ambulatory Visit: Payer: Self-pay | Admitting: Internal Medicine

## 2016-10-27 DIAGNOSIS — I10 Essential (primary) hypertension: Secondary | ICD-10-CM

## 2016-12-09 ENCOUNTER — Other Ambulatory Visit: Payer: Self-pay | Admitting: Physician Assistant

## 2016-12-09 ENCOUNTER — Other Ambulatory Visit: Payer: Self-pay | Admitting: Internal Medicine

## 2016-12-09 DIAGNOSIS — R569 Unspecified convulsions: Secondary | ICD-10-CM

## 2016-12-16 ENCOUNTER — Other Ambulatory Visit: Payer: Self-pay | Admitting: Physician Assistant

## 2016-12-16 ENCOUNTER — Other Ambulatory Visit: Payer: Self-pay | Admitting: Internal Medicine

## 2016-12-16 DIAGNOSIS — E785 Hyperlipidemia, unspecified: Secondary | ICD-10-CM

## 2016-12-16 DIAGNOSIS — E039 Hypothyroidism, unspecified: Secondary | ICD-10-CM

## 2016-12-19 ENCOUNTER — Encounter: Payer: Self-pay | Admitting: Internal Medicine

## 2016-12-24 ENCOUNTER — Encounter: Payer: Self-pay | Admitting: Physician Assistant

## 2017-01-03 ENCOUNTER — Encounter: Payer: Self-pay | Admitting: Internal Medicine

## 2017-01-30 NOTE — Progress Notes (Signed)
Complete Physical  Assessment and Plan:  Diagnoses and all orders for this visit:  Essential hypertension Continue medication Monitor blood pressure at home; call if consistently over 130/80 Continue to recommend DASH diet.   Reminder to go to the ER if any CP, SOB, nausea, dizziness, severe HA, changes vision/speech, left arm numbness and tingling and jaw pain.  Atherosclerosis of native coronary artery of native heart without angina pectoris Followed by cardiology Quit smoking - patient unwilling Continue plavix  Cerebral artery occlusion with cerebral infarction ([place]) Recommended she quit smoking, continue plavix - followed by [facility]  Peripheral vascular disease ([place]) Continue aggressive treatment of cholesterol Continue plavix Continue cardiology follow up  Hypothyroidism, unspecified type continue medications the same reminded to take on an empty stomach 30-31mins before food.  -     TSH  Seizure disorder ([place]) Continue keppra; recommended she quit smoking but unwilling at this time. Monitor levels as needed. Defer today.   Stage 3 CKD (GFR 40 ml/min)  Followed by nephrology - [facility] -     BASIC METABOLIC PANEL WITH GFR -     Urinalysis, Complete (81001) -     Microalbumin / creatinine urine ratio  Hyperlipidemia Continue medications Continue to recommend low cholesterol diet and exercise.  -     Lipid panel  SMOKER Continues to smoke 1 ppd-  instruction/counseling given, counseled patient on the dangers of tobacco use, advised patient to stop smoking, and reviewed strategies to maximize success, patient not ready to quit at this time.  - Chest XR  Vitamin D deficiency Continue supplementation -     VITAMIN D 25 Hydroxy (Vit-D Deficiency, Fractures)  Prediabetes Discussed disease and risks Discussed diet/exercise, weight management  -     Hemoglobin A1c  Medication management -     CBC with Differential/Platelet -      BASIC METABOLIC PANEL WITH GFR -     Hepatic function panel  BMI 29.66,  adult Long discussion about weight loss, diet, and exercise Recommended diet heavy in fruits and veggies and low in animal meats, cheeses, and dairy products, appropriate calorie intake Discussed appropriate weight for height  Follow up at next visit  Screening for deficiency anemia -     Vitamin B12 -     Iron,Total/Total Iron Binding Cap  Screening for colorectal cancer Patient agrees to cologuard; she will call insurance to verify coverage; we will follow up via phone call - if not covered will refer for colonoscopy  Screening for breast cancer Due for mammogram; information given - will call next week to verify due to poor compliance   Discussed med's effects and SE's. Screening labs and tests as requested with regular follow-up as recommended. Over 40 minutes of exam, counseling, chart review, and complex, high level critical decision making was performed this visit.   Future Appointments  Date Time Provider [place]  02/03/2018  3:00 PM Liane Comber, NP [facility] None     HPI  56 y.o. Caucasian married female, works from home for [employer] - presents for a complete physical and follow up for has Hypothyroidism; Hyperlipidemia; SMOKER; Essential hypertension; Coronary atherosclerosis; Cerebral artery occlusion with cerebral infarction ([place]); Peripheral vascular disease ([place]); Stage 3 CKD (GFR 40 ml/min) ; Seizure disorder ([place]); Vitamin D deficiency; Prediabetes; Poor compliance; Medication management; and BMI 29.66,  adult on their problem list.. She has ASCVD with bypass in 2002, followed annually by cardiology; hx of ruptured cerebral aneurysm in 2005 with  repeated repair in 2012, hx of seizure order on keppra - no recent seizures - last in 2008-  Followed by [facility].   she currently continues to smoke 1 pack a day; discussed risks associated with smoking at length,  patient is not ready to quit. She states she is very aware of risks and has quit in the past, but is not at a point where she is ready to do so. She is aware that she needs to make this decision for her health.   BMI is Body mass index is 29.32 kg/m., she has been working on diet and exercise. She has a pedal exerciser at her desk and uses daily - makes small healthy choices.  Wt Readings from Last 3 Encounters:  01/31/17 190 lb (86.2 kg)  10/12/16 190 lb 3.2 oz (86.3 kg)  09/26/16 190 lb 12.8 oz (86.5 kg)   Her blood pressure has been controlled at home, today their BP is BP: 136/80 She does workout- cycler at her desk.   She denies chest pain, shortness of breath, dizziness.   She is on cholesterol medication and denies myalgias. Her cholesterol is at goal. The cholesterol last visit was:   Lab Results  Component Value Date   CHOL 146 09/26/2016   HDL 34 (L) 09/26/2016   LDLCALC 82 09/26/2016   TRIG 149 09/26/2016   CHOLHDL 4.3 09/26/2016   She has been working on diet and exercise for prediabetes, she is not on bASA (on plavix), she is on ACE/ARB and denies increased appetite, nausea, paresthesia of the feet, polydipsia, polyuria, visual disturbances and vomiting. Last A1C in the office was:  Lab Results  Component Value Date   HGBA1C 5.3 09/26/2016   Last GFR: Lab Results  Component Value Date   GFRNONAA 28 (L) 09/26/2016   Lab Results  Component Value Date   GFRAA 32 (L) 09/26/2016   She is on thyroid medication. Her medication was not changed last visit.   Lab Results  Component Value Date   TSH 0.73 09/26/2016   Patient is on Vitamin D supplement.   Lab Results  Component Value Date   VD25OH 78 09/26/2016      Current Medications:  Current Outpatient Medications on File Prior to Visit  Medication Sig Dispense Refill  . clopidogrel (PLAVIX) 75 MG tablet TAKE ONE TABLET BY MOUTH TWICE A DAY 30 tablet 0  . furosemide (LASIX) 40 MG tablet TAKE 1 TABLET TWICE A DAY  FOR BLOOD PRESSURE AND FLUID 60 tablet 0  . labetalol (NORMODYNE) 300 MG tablet TAKE 1 TABLET TWICE A DAY 180 tablet 0  . levETIRAcetam (KEPPRA) 500 MG tablet TAKE 1 TABLET TWICE A DAY FOR SEIZURES 180 tablet 0  . levothyroxine (SYNTHROID, LEVOTHROID) 75 MCG tablet TAKE 1 TABLET DAILY 90 tablet 1  . losartan (COZAAR) 50 MG tablet Take 50 mg by mouth daily.    . rosuvastatin (CRESTOR) 20 MG tablet TAKE 1 TABLET DAILY FOR CHOLESTEROL 90 tablet 1  . Vitamin D, Ergocalciferol, (DRISDOL) 50000 UNITS CAPS capsule TAKE 1 CAPSULE DAILY FOR SEVERE VITAMIN D DEFICIENCY SECONDARY TO MALABSORPTION 90 capsule 1   No current facility-administered medications on file prior to visit.    Allergies:  Allergies  Allergen Reactions  . Ace Inhibitors Cough  . Penicillins   . Prednisone     Dysphoria   Medical History:  She has Hypothyroidism; Hyperlipidemia; SMOKER; Essential hypertension; Coronary atherosclerosis; Cerebral artery occlusion with cerebral infarction ([place]); Peripheral vascular disease (  [place]); Stage 3 CKD (GFR 40 ml/min) ; Seizure disorder ([place]); Vitamin D deficiency; Prediabetes; Poor compliance; Medication management; and BMI 29.66,  adult on their problem list. Health Maintenance:   Immunization History  Administered Date(s) Administered  . Influenza Split 12/02/2014  . Influenza,inj,quad, With Preservative 12/20/2015  . PPD Test 10/09/2013, 12/02/2014  . Td 10/24/2006   Tetanus: 2008 DUE Td given today Flu vaccine: 2017 DUE - done today Pap: Remote - DECLINES - discussed risks, if patient changes her mind she may request at regular visit MGM: 2009 - DUE - she will call - we will call to verify until done DEXA: Not indicated Colonoscopy: DUE - agrees to cologuard - we will call to verify until done  Last Dental Exam: Remote - needs top teeth out - she is planning to see an oral surgeon Last Eye Exam: Annually - scheduled for next week   Patient Care Team: [name], MD as PCP  - General (Internal Medicine)  Surgical History:  She has a past surgical history that includes Coronary artery bypass graft (2002); Embolization (2005); Cerebral aneurysm repair (Right, 2012); and Cerebral aneurysm repair (Right, 2005). Family History:  Herfamily history includes Cancer in her father; Heart attack in her brother; Heart disease in her brother and mother; Leukemia in her sister. Social History:  She reports that she has been smoking cigarettes.  She has a 40.00 pack-year smoking history. she has never used smokeless tobacco. She reports that she does not drink alcohol or use drugs.  Review of Systems: Review of Systems  Constitutional: Negative for malaise/fatigue and weight loss.  HENT: Negative for hearing loss and tinnitus.   Eyes: Negative for blurred vision and double vision.  Respiratory: Negative for cough, shortness of breath and wheezing.   Cardiovascular: Negative for chest pain, palpitations, orthopnea, claudication and leg swelling.  Gastrointestinal: Negative for abdominal pain, blood in stool, constipation, diarrhea, heartburn, melena, nausea and vomiting.  Genitourinary: Negative.   Musculoskeletal: Negative for joint pain and myalgias.  Skin: Negative for rash.  Neurological: Negative for dizziness, tingling, sensory change, weakness and headaches.  Endo/Heme/Allergies: Positive for environmental allergies (She reports mild symptoms - uses OTC medications rarely as needed). Negative for polydipsia.  Psychiatric/Behavioral: Negative.   All other systems reviewed and are negative.   Physical Exam: Estimated body mass index is 29.32 kg/m as calculated from the following:   Height as of this encounter: 5' 7.5" (1.715 m).   Weight as of this encounter: 190 lb (86.2 kg). BP 136/80   Pulse 63   Temp (!) 97.5 F (36.4 C)   Ht 5' 7.5" (1.715 m)   Wt 190 lb (86.2 kg)   SpO2 98%   BMI 29.32 kg/m  General Appearance: Well nourished, in no apparent distress.   Eyes: PERRLA, EOMs, conjunctiva no swelling or erythema.  Sinuses: No Frontal/maxillary tenderness  ENT/Mouth: Ext aud canals clear, normal light reflex with TMs without erythema, bulging. Extremely poor dentition - broken/missing/black teeth. No erythema, swelling, or exudate on post pharynx. Tonsils not swollen or erythematous. Hearing normal.  Neck: Supple, thyroid normal. No bruits  Respiratory: Respiratory effort normal, BS present but somewhat coarse throughout; no wheezing, stridor.  Cardio: RRR without murmurs, rubs or gallops. 1+ symmetrical peripheral pulses without edema.  Chest: symmetric, with normal excursions and percussion.  Breasts: Symmetric, without lumps, nipple discharge, retractions.  Abdomen: Soft, nontender, no guarding, rebound, hernias, masses, or organomegaly.  Lymphatics: Non tender without lymphadenopathy.  Genitourinary: Patient declines Musculoskeletal: Full  ROM all peripheral extremities,5/5 strength, and normal gait.  Skin: Warm, dry without rashes, lesions, ecchymosis. Neuro: Cranial nerves intact, reflexes equal bilaterally. Normal muscle tone, no cerebellar symptoms. Sensation intact.  Psych: Awake and oriented X 3, normal affect, Insight and Judgment appropriate.   EKG: Had recently by cardiology -defer   Izora Ribas 3:11 PM [facility]

## 2017-01-31 ENCOUNTER — Ambulatory Visit (INDEPENDENT_AMBULATORY_CARE_PROVIDER_SITE_OTHER): Payer: 59 | Admitting: Adult Health

## 2017-01-31 ENCOUNTER — Encounter: Payer: Self-pay | Admitting: Adult Health

## 2017-01-31 VITALS — BP 136/80 | HR 63 | Temp 97.5°F | Ht 67.5 in | Wt 190.0 lb

## 2017-01-31 DIAGNOSIS — I6350 Cerebral infarction due to unspecified occlusion or stenosis of unspecified cerebral artery: Secondary | ICD-10-CM

## 2017-01-31 DIAGNOSIS — E559 Vitamin D deficiency, unspecified: Secondary | ICD-10-CM

## 2017-01-31 DIAGNOSIS — R7303 Prediabetes: Secondary | ICD-10-CM

## 2017-01-31 DIAGNOSIS — Z6829 Body mass index (BMI) 29.0-29.9, adult: Secondary | ICD-10-CM

## 2017-01-31 DIAGNOSIS — Z23 Encounter for immunization: Secondary | ICD-10-CM

## 2017-01-31 DIAGNOSIS — Z0000 Encounter for general adult medical examination without abnormal findings: Secondary | ICD-10-CM

## 2017-01-31 DIAGNOSIS — Z1212 Encounter for screening for malignant neoplasm of rectum: Secondary | ICD-10-CM

## 2017-01-31 DIAGNOSIS — I10 Essential (primary) hypertension: Secondary | ICD-10-CM

## 2017-01-31 DIAGNOSIS — E782 Mixed hyperlipidemia: Secondary | ICD-10-CM

## 2017-01-31 DIAGNOSIS — G40909 Epilepsy, unspecified, not intractable, without status epilepticus: Secondary | ICD-10-CM

## 2017-01-31 DIAGNOSIS — F17200 Nicotine dependence, unspecified, uncomplicated: Secondary | ICD-10-CM

## 2017-01-31 DIAGNOSIS — I739 Peripheral vascular disease, unspecified: Secondary | ICD-10-CM

## 2017-01-31 DIAGNOSIS — Z130 Encounter for screening for diseases of the blood and blood-forming organs and certain disorders involving the immune mechanism: Secondary | ICD-10-CM

## 2017-01-31 DIAGNOSIS — N1832 Chronic kidney disease, stage 3b: Secondary | ICD-10-CM

## 2017-01-31 DIAGNOSIS — Z1211 Encounter for screening for malignant neoplasm of colon: Secondary | ICD-10-CM

## 2017-01-31 DIAGNOSIS — I2510 Atherosclerotic heart disease of native coronary artery without angina pectoris: Secondary | ICD-10-CM

## 2017-01-31 DIAGNOSIS — N183 Chronic kidney disease, stage 3 (moderate): Secondary | ICD-10-CM

## 2017-01-31 DIAGNOSIS — Z79899 Other long term (current) drug therapy: Secondary | ICD-10-CM

## 2017-01-31 DIAGNOSIS — E039 Hypothyroidism, unspecified: Secondary | ICD-10-CM

## 2017-01-31 NOTE — Patient Instructions (Addendum)
Call for mammogram and cologuard this week; either give [place] a call or we will call you back next week to verify.  Increase water to 5-6 bottles daily -  Think about pros/cons of smoking - when you are ready, let us know    [facility]  7 a.m.-6:30 p.m., Monday 7 a.m.-5 p.m., Tuesday-Friday Schedule an appointment by calling [phone].  [facility] Schedule an appointment by calling [phone].   Cologuard  Cologuard is an easy to use noninvasive colon cancer screening test based on the latest advances in stool DNA science.   Colon cancer is 3rd most diagnosed cancer and 2nd leading cause of death in both men and women 17 years of age and older despite being one of the most preventable and treatable cancers if found early.  4 of out 5 people diagnosed with colon cancer have NO prior family history.  When caught EARLY 90% of colon cancer is curable.   You have agreed to do a Cologuard screening and have declined a colonoscopy in spite of being explained the risks and benefits of the colonoscopy in detail, including cancer and death. Please understand that this is test not as sensitive or specific as a colonoscopy and you are still recommended to get a colonoscopy.   If you are NOT medicare please call your insurance company and give them these items to see if they will cover it: 1) CPT code, [id number] 2) Provider is Probation officer 3) [employer] NPI [id number] 4) [place] Tax ID [phone]  Out-of-pocket cost for Cologuard can range from $0 - $649 so please call  You will receive a short call from [place] support center at [employer], when you receive a call they will say they are from [place],  to confirm your mailing address and give you more information.  When they calll you, it will appear on the caller ID as "[employer]" or in some cases only this number will appear, [phone].    [employer] will ship your collection kit directly to you. You will collect a single stool sample in the privacy of your own home, no special preparation required. You will return the kit via [place] pre-paid shipping or pick-up, in the same box it arrived in. Then I will contact you to discuss your results after I receive them from the laboratory.   If you have any questions or concerns, Cologuard Customer Support Specialist are available 24 hours a day, 7 days a week at [phone] or go to [email or web address].

## 2017-02-01 ENCOUNTER — Other Ambulatory Visit: Payer: Self-pay | Admitting: Adult Health

## 2017-02-01 ENCOUNTER — Encounter: Payer: Self-pay | Admitting: Adult Health

## 2017-02-01 DIAGNOSIS — D631 Anemia in chronic kidney disease: Secondary | ICD-10-CM | POA: Insufficient documentation

## 2017-02-01 DIAGNOSIS — D649 Anemia, unspecified: Secondary | ICD-10-CM | POA: Insufficient documentation

## 2017-02-01 LAB — CBC WITH DIFFERENTIAL/PLATELET
BASOS PCT: 1.3 %
Basophils Absolute: 81 cells/uL (ref 0–200)
Eosinophils Absolute: 229 cells/uL (ref 15–500)
Eosinophils Relative: 3.7 %
HCT: 32.8 % — ABNORMAL LOW (ref 35.0–45.0)
HEMOGLOBIN: 10.8 g/dL — AB (ref 11.7–15.5)
LYMPHS ABS: 1643 {cells}/uL (ref 850–3900)
MCH: 31.2 pg (ref 27.0–33.0)
MCHC: 32.9 g/dL (ref 32.0–36.0)
MCV: 94.8 fL (ref 80.0–100.0)
MPV: 10.3 fL (ref 7.5–12.5)
Monocytes Relative: 8.7 %
Neutro Abs: 3708 cells/uL (ref 1500–7800)
Neutrophils Relative %: 59.8 %
PLATELETS: 225 10*3/uL (ref 140–400)
RBC: 3.46 10*6/uL — AB (ref 3.80–5.10)
RDW: 12.8 % (ref 11.0–15.0)
TOTAL LYMPHOCYTE: 26.5 %
WBC: 6.2 10*3/uL (ref 3.8–10.8)
WBCMIX: 539 {cells}/uL (ref 200–950)

## 2017-02-01 LAB — IRON, TOTAL/TOTAL IRON BINDING CAP
%SAT: 21 % (calc) (ref 11–50)
Iron: 54 ug/dL (ref 45–160)
TIBC: 262 ug/dL (ref 250–450)

## 2017-02-01 LAB — HEPATIC FUNCTION PANEL
AG RATIO: 1.5 (calc) (ref 1.0–2.5)
ALBUMIN MSPROF: 3.9 g/dL (ref 3.6–5.1)
ALT: 7 U/L (ref 6–29)
AST: 11 U/L (ref 10–35)
Alkaline phosphatase (APISO): 49 U/L (ref 33–130)
BILIRUBIN TOTAL: 0.3 mg/dL (ref 0.2–1.2)
Bilirubin, Direct: 0.1 mg/dL (ref 0.0–0.2)
Globulin: 2.6 g/dL (calc) (ref 1.9–3.7)
Indirect Bilirubin: 0.2 mg/dL (calc) (ref 0.2–1.2)
Total Protein: 6.5 g/dL (ref 6.1–8.1)

## 2017-02-01 LAB — URINALYSIS, COMPLETE
BACTERIA UA: NONE SEEN /HPF
BILIRUBIN URINE: NEGATIVE
Glucose, UA: NEGATIVE
HGB URINE DIPSTICK: NEGATIVE
Hyaline Cast: NONE SEEN /LPF
KETONES UR: NEGATIVE
LEUKOCYTES UA: NEGATIVE
Nitrite: NEGATIVE
Protein, ur: NEGATIVE
RBC / HPF: NONE SEEN /HPF (ref 0–2)
Specific Gravity, Urine: 1.009 (ref 1.001–1.03)

## 2017-02-01 LAB — HEMOGLOBIN A1C
Hgb A1c MFr Bld: 5.3 % of total Hgb (ref <5.7)
Mean Plasma Glucose: 105 (calc)
eAG (mmol/L): 5.8 (calc)

## 2017-02-01 LAB — BASIC METABOLIC PANEL WITH GFR
BUN/Creatinine Ratio: 12 (calc) (ref 6–22)
BUN: 23 mg/dL (ref 7–25)
CALCIUM: 9.2 mg/dL (ref 8.6–10.4)
CO2: 29 mmol/L (ref 20–32)
CREATININE: 1.96 mg/dL — AB (ref 0.50–1.05)
Chloride: 99 mmol/L (ref 98–110)
GFR, EST NON AFRICAN AMERICAN: 28 mL/min/{1.73_m2} — AB (ref >=60)
GFR, Est African American: 32 mL/min/{1.73_m2} — ABNORMAL LOW (ref >=60)
Glucose, Bld: 89 mg/dL (ref 65–99)
Potassium: 3.9 mmol/L (ref 3.5–5.3)
Sodium: 140 mmol/L (ref 135–146)

## 2017-02-01 LAB — VITAMIN B12: Vitamin B-12: 317 pg/mL (ref 200–1100)

## 2017-02-01 LAB — RETICULOCYTES
ABS RETIC: 51900 {cells}/uL (ref 20000–8000)
RETIC CT PCT: 1.5 %

## 2017-02-01 LAB — LIPID PANEL
Cholesterol: 139 mg/dL (ref <200)
HDL: 36 mg/dL — ABNORMAL LOW (ref >50)
LDL CHOLESTEROL (CALC): 75 mg/dL
NON-HDL CHOLESTEROL (CALC): 103 mg/dL (ref <130)
Total CHOL/HDL Ratio: 3.9 (calc) (ref <5.0)
Triglycerides: 189 mg/dL — ABNORMAL HIGH (ref <150)

## 2017-02-01 LAB — TEST AUTHORIZATION

## 2017-02-01 LAB — TSH: TSH: 0.68 m[IU]/L (ref 0.40–4.50)

## 2017-02-01 LAB — MICROALBUMIN / CREATININE URINE RATIO
Creatinine, Urine: 62 mg/dL (ref 20–275)
MICROALB UR: 1.5 mg/dL
Microalb Creat Ratio: 24 mcg/mg creat (ref <30)

## 2017-02-01 LAB — VITAMIN D 25 HYDROXY (VIT D DEFICIENCY, FRACTURES): VIT D 25 HYDROXY: 65 ng/mL (ref 30–100)

## 2017-02-04 ENCOUNTER — Other Ambulatory Visit: Payer: Self-pay | Admitting: Internal Medicine

## 2017-02-04 DIAGNOSIS — I10 Essential (primary) hypertension: Secondary | ICD-10-CM

## 2017-02-14 ENCOUNTER — Other Ambulatory Visit: Payer: 59

## 2017-02-14 DIAGNOSIS — D649 Anemia, unspecified: Secondary | ICD-10-CM

## 2017-02-14 LAB — CBC WITH DIFFERENTIAL/PLATELET
BASOS ABS: 36 {cells}/uL (ref 0–200)
Basophils Relative: 0.3 %
EOS ABS: 60 {cells}/uL (ref 15–500)
Eosinophils Relative: 0.5 %
HEMATOCRIT: 33.8 % — AB (ref 35.0–45.0)
Hemoglobin: 11.3 g/dL — ABNORMAL LOW (ref 11.7–15.5)
LYMPHS ABS: 1797 {cells}/uL (ref 850–3900)
MCH: 30.9 pg (ref 27.0–33.0)
MCHC: 33.4 g/dL (ref 32.0–36.0)
MCV: 92.3 fL (ref 80.0–100.0)
MPV: 10.4 fL (ref 7.5–12.5)
Monocytes Relative: 11.4 %
NEUTROS PCT: 72.7 %
Neutro Abs: 8651 cells/uL — ABNORMAL HIGH (ref 1500–7800)
Platelets: 229 10*3/uL (ref 140–400)
RBC: 3.66 10*6/uL — AB (ref 3.80–5.10)
RDW: 12.9 % (ref 11.0–15.0)
TOTAL LYMPHOCYTE: 15.1 %
WBC: 11.9 10*3/uL — ABNORMAL HIGH (ref 3.8–10.8)
WBCMIX: 1357 {cells}/uL — AB (ref 200–950)

## 2017-03-15 ENCOUNTER — Other Ambulatory Visit: Payer: Self-pay | Admitting: *Deleted

## 2017-03-15 DIAGNOSIS — R569 Unspecified convulsions: Secondary | ICD-10-CM

## 2017-03-15 MED ORDER — FUROSEMIDE 40 MG PO TABS: ORAL_TABLET | ORAL | 0 refills | Status: DC

## 2017-03-15 MED ORDER — LEVETIRACETAM 500 MG PO TABS: ORAL_TABLET | ORAL | 0 refills | Status: DC

## 2017-04-29 ENCOUNTER — Other Ambulatory Visit: Payer: Self-pay | Admitting: Internal Medicine

## 2017-04-29 DIAGNOSIS — I10 Essential (primary) hypertension: Secondary | ICD-10-CM

## 2017-04-29 MED ORDER — LABETALOL HCL 300 MG PO TABS: 300.0000 mg | ORAL_TABLET | Freq: Two times a day (BID) | ORAL | 0 refills | Status: DC

## 2017-05-01 ENCOUNTER — Other Ambulatory Visit: Payer: Self-pay | Admitting: Internal Medicine

## 2017-05-01 DIAGNOSIS — I10 Essential (primary) hypertension: Secondary | ICD-10-CM

## 2017-05-01 MED ORDER — LABETALOL HCL 300 MG PO TABS: 300.0000 mg | ORAL_TABLET | Freq: Two times a day (BID) | ORAL | 0 refills | Status: DC

## 2017-05-02 ENCOUNTER — Other Ambulatory Visit: Payer: Self-pay

## 2017-05-02 DIAGNOSIS — I10 Essential (primary) hypertension: Secondary | ICD-10-CM

## 2017-05-02 MED ORDER — LABETALOL HCL 300 MG PO TABS: 300.0000 mg | ORAL_TABLET | Freq: Two times a day (BID) | ORAL | 1 refills | Status: DC

## 2017-05-03 ENCOUNTER — Other Ambulatory Visit: Payer: Self-pay | Admitting: Internal Medicine

## 2017-05-03 DIAGNOSIS — E039 Hypothyroidism, unspecified: Secondary | ICD-10-CM

## 2017-05-03 DIAGNOSIS — E785 Hyperlipidemia, unspecified: Secondary | ICD-10-CM

## 2017-05-08 ENCOUNTER — Other Ambulatory Visit: Payer: Self-pay | Admitting: Internal Medicine

## 2017-05-08 DIAGNOSIS — R569 Unspecified convulsions: Secondary | ICD-10-CM

## 2017-05-09 ENCOUNTER — Ambulatory Visit: Payer: Self-pay | Admitting: Adult Health

## 2017-05-13 NOTE — Progress Notes (Signed)
FOLLOW UP  Assessment and Plan:   Hypertension Well controlled with current medications  Monitor blood pressure at home; patient to call if consistently greater than 130/80 Continue DASH diet.   Reminder to go to the ER if any CP, SOB, nausea, dizziness, severe HA, changes vision/speech, left arm numbness and tingling and jaw pain.  Cholesterol Currently at LDL goal; diet for triglycerides discussed Continue low cholesterol diet and exercise.  Check lipid panel.   Other abnormal glucose Recent A1Cs at goal Discussed diet/exercise, weight management  Defer A1C; check BMP  CKD stage 3 Increase fluids, avoid NSAIDS, monitor sugars, will monitor Check BMP/GFR  Hypothyroidism continue medications the same pending lab results reminded to take on an empty stomach 30-66mins before food.  check TSH level  Overweight with co morbidities Long discussion about weight loss, diet, and exercise Recommended diet heavy in fruits and veggies and low in animal meats, cheeses, and dairy products, appropriate calorie intake Will follow up in 3 months  Vitamin D Def At goal at last visit; continue supplementation to maintain goal of 70-100 Defer Vit D level  Cough Will treat due to duration - Zpak, prednisone, continue daily allergy medication Avoid exposure to tobacco smoke and fumes. Call if shortness of breath worsens, blood in sputum, change in character of cough, development of fever or chills, inability to maintain nutrition and hydration. Avoid exposure to tobacco smoke and fumes. Chest x-ray.   Continue diet and meds as discussed. Further disposition pending results of labs. Discussed med's effects and SE's.   Over 30 minutes of exam, counseling, chart review, and critical decision making was performed.   Future Appointments  Date Time Provider [place]  02/03/2018  3:00 PM Liane Comber, NP [facility] None     ----------------------------------------------------------------------------------------------------------------------  HPI 57 y.o. female  presents for 3 month follow up on hypertension, cholesterol, glucose management, weight and vitamin D deficiency. She has ASCVD with bypass in 2002, followed annually by cardiology; hx of ruptured cerebral aneurysm in 2005 with repeated repair in 2012, hx of seizure order on keppra - no recent seizures - last in 2008-  Followed by [facility].   She also c/o 3 weeks of URI symptoms - dry hacky cough/non-productive during the day, nasal drainage, congestion which worse at night, rattling in her chest. Denies fever/chills, chest pain, dyspnea.  She has tried xyzal which helped briefly but now resumed to previous and not improving.   she currently continues to smoke 1 pack a day; discussed risks associated with smoking, patient is not ready to quit.   BMI is Body mass index is 29.78 kg/m., she has been working on diet and exercise. Wt Readings from Last 3 Encounters:  05/14/17 193 lb (87.5 kg)  01/31/17 190 lb (86.2 kg)  10/12/16 190 lb 3.2 oz (86.3 kg)   Her blood pressure has been controlled at home, today their BP is BP: 110/74  She does workout occasionally - has seated cycling machine that she does while at her desk. She denies chest pain, shortness of breath, dizziness.   She is on cholesterol medication (crestor 20 mg daily) and denies myalgias. Her LDL cholesterol is at goal; triglycerides were elevated at last check. The cholesterol last visit was:   Lab Results  Component Value Date   CHOL 139 01/31/2017   HDL 36 (L) 01/31/2017   LDLCALC 75 01/31/2017   TRIG 189 (H) 01/31/2017   CHOLHDL 3.9 01/31/2017    She has been working on  diet and exercise for glucose management (intermittent hx of prediabetes), and denies foot ulcerations, increased appetite, nausea, paresthesia of the feet, polydipsia, polyuria, visual disturbances,  vomiting and weight loss. Last A1C in the office was:  Lab Results  Component Value Date   HGBA1C 5.3 01/31/2017   She is on thyroid medication. Her medication was not changed last visit.   Lab Results  Component Value Date   TSH 0.68 01/31/2017   Patient is on Vitamin D supplement and at goal at recent check:    Lab Results  Component Value Date   VD25OH 65 01/31/2017        Current Medications:  Current Outpatient Medications on File Prior to Visit  Medication Sig  . clopidogrel (PLAVIX) 75 MG tablet TAKE ONE TABLET BY MOUTH TWICE A DAY  . furosemide (LASIX) 40 MG tablet TAKE 1 TABLET BY MOUTH  TWICE A DAY FOR BLOOD  PRESSURE AND FLUID  . labetalol (NORMODYNE) 300 MG tablet Take 1 tablet (300 mg total) by mouth 2 (two) times daily.  [place] levETIRAcetam (KEPPRA) 500 MG tablet TAKE 1 TABLET BY MOUTH  TWICE A DAY FOR SEIZURES  . rosuvastatin (CRESTOR) 20 MG tablet TAKE 1 TABLET DAILY FOR  CHOLESTEROL  . SYNTHROID 75 MCG tablet TAKE 1 TABLET DAILY  . Vitamin D, Ergocalciferol, (DRISDOL) 50000 UNITS CAPS capsule TAKE 1 CAPSULE DAILY FOR SEVERE VITAMIN D DEFICIENCY SECONDARY TO MALABSORPTION (Patient taking differently: TAKE 1 CAPSULE EVERY OTHER DAY FOR SEVERE VITAMIN D DEFICIENCY SECONDARY TO MALABSORPTION)  . losartan (COZAAR) 50 MG tablet Take 50 mg by mouth daily.   No current facility-administered medications on file prior to visit.      Allergies:  Allergies  Allergen Reactions  . Ace Inhibitors Cough  . Penicillins   . Prednisone     Dysphoria     Medical History:  Past Medical History:  Diagnosis Date  . Vitamin D deficiency    Family history- Reviewed and unchanged Social history- Reviewed and unchanged   Review of Systems:  Review of Systems  Constitutional: Negative for malaise/fatigue and weight loss.  HENT: Negative for hearing loss and tinnitus.   Eyes: Negative for blurred vision and double vision.  Respiratory: Negative for cough, shortness of breath  and wheezing.   Cardiovascular: Negative for chest pain, palpitations, orthopnea, claudication and leg swelling.  Gastrointestinal: Negative for abdominal pain, blood in stool, constipation, diarrhea, heartburn, melena, nausea and vomiting.  Genitourinary: Negative.   Musculoskeletal: Negative for joint pain and myalgias.  Skin: Negative for rash.  Neurological: Negative for dizziness, tingling, sensory change, weakness and headaches.  Endo/Heme/Allergies: Negative for polydipsia.  Psychiatric/Behavioral: Negative.   All other systems reviewed and are negative.     Physical Exam: BP 110/74   Pulse 65   Temp (!) 97.5 F (36.4 C)   Ht 5' 7.5" (1.715 m)   Wt 193 lb (87.5 kg)   SpO2 92%   BMI 29.78 kg/m  Wt Readings from Last 3 Encounters:  05/14/17 193 lb (87.5 kg)  01/31/17 190 lb (86.2 kg)  10/12/16 190 lb 3.2 oz (86.3 kg)   General Appearance: Well nourished, in no apparent distress. Eyes: PERRLA, EOMs, conjunctiva no swelling or erythema Sinuses: No Frontal/maxillary tenderness ENT/Mouth: Ext aud canals clear, TMs without erythema, bulging. No erythema, swelling, or exudate on post pharynx.  Tonsils not swollen or erythematous. Hearing normal.  Neck: Supple, thyroid normal.  Respiratory: Respiratory effort normal, BS equal bilaterally with faint rales of RUL, rhonchi,  wheezing or stridor.  Cardio: RRR with no MRGs. Brisk peripheral pulses without edema.  Abdomen: Soft, + BS.  Non tender, no guarding, rebound, hernias, masses. Lymphatics: Non tender without lymphadenopathy.  Musculoskeletal: Full ROM, 5/5 strength, Normal gait Skin: Warm, dry without rashes, lesions, ecchymosis.  Neuro: Cranial nerves intact. No cerebellar symptoms.  Psych: Awake and oriented X 3, normal affect, Insight and Judgment appropriate.    Izora Ribas, NP 3:51 PM [facility]

## 2017-05-14 ENCOUNTER — Ambulatory Visit (INDEPENDENT_AMBULATORY_CARE_PROVIDER_SITE_OTHER): Payer: 59 | Admitting: Adult Health

## 2017-05-14 ENCOUNTER — Encounter: Payer: Self-pay | Admitting: Adult Health

## 2017-05-14 VITALS — BP 110/74 | HR 65 | Temp 97.5°F | Ht 67.5 in | Wt 193.0 lb

## 2017-05-14 DIAGNOSIS — E782 Mixed hyperlipidemia: Secondary | ICD-10-CM | POA: Diagnosis not present

## 2017-05-14 DIAGNOSIS — R05 Cough: Secondary | ICD-10-CM

## 2017-05-14 DIAGNOSIS — Z79899 Other long term (current) drug therapy: Secondary | ICD-10-CM

## 2017-05-14 DIAGNOSIS — N1832 Chronic kidney disease, stage 3b: Secondary | ICD-10-CM

## 2017-05-14 DIAGNOSIS — F17200 Nicotine dependence, unspecified, uncomplicated: Secondary | ICD-10-CM

## 2017-05-14 DIAGNOSIS — G40909 Epilepsy, unspecified, not intractable, without status epilepticus: Secondary | ICD-10-CM

## 2017-05-14 DIAGNOSIS — E663 Overweight: Secondary | ICD-10-CM

## 2017-05-14 DIAGNOSIS — N183 Chronic kidney disease, stage 3 (moderate): Secondary | ICD-10-CM | POA: Diagnosis not present

## 2017-05-14 DIAGNOSIS — Z1239 Encounter for other screening for malignant neoplasm of breast: Secondary | ICD-10-CM

## 2017-05-14 DIAGNOSIS — R059 Cough, unspecified: Secondary | ICD-10-CM

## 2017-05-14 DIAGNOSIS — R7309 Other abnormal glucose: Secondary | ICD-10-CM | POA: Diagnosis not present

## 2017-05-14 DIAGNOSIS — E559 Vitamin D deficiency, unspecified: Secondary | ICD-10-CM

## 2017-05-14 DIAGNOSIS — I10 Essential (primary) hypertension: Secondary | ICD-10-CM | POA: Diagnosis not present

## 2017-05-14 DIAGNOSIS — E039 Hypothyroidism, unspecified: Secondary | ICD-10-CM | POA: Diagnosis not present

## 2017-05-14 MED ORDER — PREDNISONE 20 MG PO TABS: ORAL_TABLET | ORAL | 0 refills | Status: DC

## 2017-05-14 MED ORDER — PROMETHAZINE-DM 6.25-15 MG/5ML PO SYRP: 5.0000 mL | ORAL_SOLUTION | Freq: Four times a day (QID) | ORAL | 1 refills | Status: DC | PRN

## 2017-05-14 MED ORDER — AZITHROMYCIN 250 MG PO TABS: ORAL_TABLET | ORAL | 1 refills | Status: AC

## 2017-05-14 NOTE — Patient Instructions (Signed)
Common causes of cough OR hoarseness OR sore throat:   Allergies, Viral Infections, Acid Reflux and Bacterial Infections.   Allergies and viral infections cause a cough OR sore throat by post nasal drip and are often worse at night, can also have sneezing, lower grade fevers, clear/yellow mucus. This is best treated with allergy medications or nasal sprays.  Please get on allegra for 1-2 weeks The strongest is allegra or fexafinadine  Cheapest at [employer], [employer], [employer]   Bacterial infections are more severe than allergies or viral infections with fever, teeth pain, fatigue. This can be treated with prednisone and the same over the counter medication and after 7 days can be treated with an antibiotic.   Silent reflux/GERD can cause a cough OR sore throat OR hoarseness WITHOUT heart burn because the esophagus that goes to the stomach and trachea that goes to the lungs are very close and when you lay down the acid can irritate your throat and lungs. This can cause hoarseness, cough, and wheezing. Please stop any alcohol or anti-inflammatories like aleve/advil/ibuprofen and start an over the counter Prilosec or omeprazole 1-2 times daily 55mins before food for 2 weeks, then switch to over the counter zantac/ratinidine or pepcid/famotadine once at night for 2 weeks.    sometimes irritation causes more irritation. Try voice rest, use sugar free cough drops to prevent coughing, and try to stop clearing your throat.   If you ever have a cough that does not go away after trying these things please make a follow up visit for further evaluation or we can refer you to a specialist. Or if you ever have shortness of breath or chest pain go to the ER.

## 2017-05-17 LAB — HEPATIC FUNCTION PANEL
AG Ratio: 1.6 (calc) (ref 1.0–2.5)
ALBUMIN MSPROF: 4 g/dL (ref 3.6–5.1)
ALT: 14 U/L (ref 6–29)
AST: 19 U/L (ref 10–35)
Alkaline phosphatase (APISO): 55 U/L (ref 33–130)
BILIRUBIN DIRECT: 0 mg/dL (ref 0.0–0.2)
BILIRUBIN TOTAL: 0.3 mg/dL (ref 0.2–1.2)
GLOBULIN: 2.5 g/dL (ref 1.9–3.7)
Indirect Bilirubin: 0.3 mg/dL (calc) (ref 0.2–1.2)
Total Protein: 6.5 g/dL (ref 6.1–8.1)

## 2017-05-17 LAB — CBC WITH DIFFERENTIAL/PLATELET
Basophils Absolute: 39 cells/uL (ref 0–200)
Basophils Relative: 0.6 %
EOS ABS: 189 {cells}/uL (ref 15–500)
Eosinophils Relative: 2.9 %
HCT: 32.6 % — ABNORMAL LOW (ref 35.0–45.0)
Hemoglobin: 11 g/dL — ABNORMAL LOW (ref 11.7–15.5)
LYMPHS ABS: 1437 {cells}/uL (ref 850–3900)
MCH: 30.7 pg (ref 27.0–33.0)
MCHC: 33.7 g/dL (ref 32.0–36.0)
MCV: 91.1 fL (ref 80.0–100.0)
MPV: 10.2 fL (ref 7.5–12.5)
Monocytes Relative: 10.9 %
Neutro Abs: 4128 cells/uL (ref 1500–7800)
Neutrophils Relative %: 63.5 %
PLATELETS: 238 10*3/uL (ref 140–400)
RBC: 3.58 10*6/uL — ABNORMAL LOW (ref 3.80–5.10)
RDW: 13.6 % (ref 11.0–15.0)
TOTAL LYMPHOCYTE: 22.1 %
WBC: 6.5 10*3/uL (ref 3.8–10.8)
WBCMIX: 709 {cells}/uL (ref 200–950)

## 2017-05-17 LAB — LIPID PANEL
CHOL/HDL RATIO: 3.5 (calc) (ref <5.0)
CHOLESTEROL: 142 mg/dL (ref <200)
HDL: 41 mg/dL — ABNORMAL LOW (ref >50)
LDL CHOLESTEROL (CALC): 78 mg/dL
NON-HDL CHOLESTEROL (CALC): 101 mg/dL (ref <130)
Triglycerides: 131 mg/dL (ref <150)

## 2017-05-17 LAB — BASIC METABOLIC PANEL WITH GFR
BUN / CREAT RATIO: 13 (calc) (ref 6–22)
BUN: 24 mg/dL (ref 7–25)
CHLORIDE: 101 mmol/L (ref 98–110)
CO2: 31 mmol/L (ref 20–32)
Calcium: 9.4 mg/dL (ref 8.6–10.4)
Creat: 1.84 mg/dL — ABNORMAL HIGH (ref 0.50–1.05)
GFR, EST AFRICAN AMERICAN: 34 mL/min/{1.73_m2} — AB (ref >=60)
GFR, Est Non African American: 30 mL/min/{1.73_m2} — ABNORMAL LOW (ref >=60)
Glucose, Bld: 87 mg/dL (ref 65–99)
POTASSIUM: 4.4 mmol/L (ref 3.5–5.3)
Sodium: 140 mmol/L (ref 135–146)

## 2017-05-17 LAB — LEVETIRACETAM LEVEL: Keppra (Levetiracetam): 30.8 ug/mL

## 2017-05-17 LAB — TSH: TSH: 0.84 m[IU]/L (ref 0.40–4.50)

## 2017-06-17 ENCOUNTER — Ambulatory Visit: Payer: 59

## 2017-06-20 DIAGNOSIS — N183 Chronic kidney disease, stage 3 (moderate): Secondary | ICD-10-CM | POA: Diagnosis not present

## 2017-06-20 DIAGNOSIS — I129 Hypertensive chronic kidney disease with stage 1 through stage 4 chronic kidney disease, or unspecified chronic kidney disease: Secondary | ICD-10-CM | POA: Diagnosis not present

## 2017-06-20 DIAGNOSIS — E785 Hyperlipidemia, unspecified: Secondary | ICD-10-CM | POA: Diagnosis not present

## 2017-06-25 ENCOUNTER — Ambulatory Visit
Admission: RE | Admit: 2017-06-25 | Discharge: 2017-06-25 | Disposition: A | Discharge status: Home / Self Care | Payer: 59 | Source: Ambulatory Visit | Attending: Adult Health | Admitting: Adult Health

## 2017-06-25 DIAGNOSIS — Z1231 Encounter for screening mammogram for malignant neoplasm of breast: Secondary | ICD-10-CM | POA: Diagnosis not present

## 2017-06-25 DIAGNOSIS — Z1239 Encounter for other screening for malignant neoplasm of breast: Secondary | ICD-10-CM

## 2017-06-26 LAB — HM MAMMOGRAPHY

## 2017-06-27 ENCOUNTER — Encounter: Payer: Self-pay | Admitting: Adult Health

## 2017-07-07 ENCOUNTER — Other Ambulatory Visit: Payer: Self-pay | Admitting: Internal Medicine

## 2017-07-07 ENCOUNTER — Other Ambulatory Visit: Payer: Self-pay | Admitting: Adult Health

## 2017-07-07 DIAGNOSIS — E039 Hypothyroidism, unspecified: Secondary | ICD-10-CM

## 2017-07-07 DIAGNOSIS — E785 Hyperlipidemia, unspecified: Secondary | ICD-10-CM

## 2017-07-07 DIAGNOSIS — R569 Unspecified convulsions: Secondary | ICD-10-CM

## 2017-08-30 NOTE — Progress Notes (Signed)
FOLLOW UP  Assessment and Plan:    CAD Continue plavix, ASA Control blood pressure, cholesterol, glucose, increase exercise.  Continue cardiology follow up  Hypertension Well controlled with current medications  Monitor blood pressure at home; patient to call if consistently greater than 130/80 Continue DASH diet.   Reminder to go to the ER if any CP, SOB, nausea, dizziness, severe HA, changes vision/speech, left arm numbness and tingling and jaw pain.  Cholesterol Currently at LDL goal; continue medications Continue low cholesterol diet and exercise.  Check lipid panel.   Other abnormal glucose Recent A1Cs at goal Discussed diet/exercise, weight management  Defer A1C; check CMP  CKD stage 3 Increase fluids, avoid NSAIDS, monitor sugars, will monitor Check CMP/GFR  Hypothyroidism continue medications the same pending lab results reminded to take on an empty stomach 30-68mins before food.  check TSH level  Overweight with co morbidities Long discussion about weight loss, diet, and exercise Recommended diet heavy in fruits and veggies and low in animal meats, cheeses, and dairy products, appropriate calorie intake Will follow up in 3 months  Vitamin D Def At goal at last visit; continue supplementation to maintain goal of 70-100 Defer Vit D level  Tobacco use Discussed risks associated with tobacco use and advised to reduce or quit Patient is not ready to do so, but advised to consider strongly Will follow up at the next visit  Seizures Last seizure remote; stable on keppra Keppra levels stable at check last visit - defer  Lesion of right arm; history of basal cell Quickly changing lesion of right forearm Refer back to skin surgery center as cannot r/o basal cell  + hx of basal cell lesion to face which was removed  Continue diet and meds as discussed. Further disposition pending results of labs. Discussed med's effects and SE's.   Over 30 minutes of exam,  counseling, chart review, and critical decision making was performed.   Future Appointments  Date Time Provider [place]  10/18/2017  3:30 PM Daune Perch, NP [facility]  02/03/2018  3:00 PM Liane Comber, NP [facility] None    ----------------------------------------------------------------------------------------------------------------------  HPI 57 y.o. female  presents for 3 month follow up on hypertension, cholesterol, glucose management, weight and vitamin D deficiency. She has ASCVD with bypass in 2002, followed annually by cardiology; hx of ruptured cerebral aneurysm in 2005 with repeated repair in 2012, hx of seizure order on keppra - no recent seizures - last in 2008-  Followed by [facility].   She presents with new lesion to right dorsal forearm; ~1cm in size, appeared 2-3 months ago and grew quickly, current stable in size for last month or so; has pearly appearance with dry flaky skin, currently bleeding. Has + hx of basal cell to face which was removed by skin surgery center.   she currently continues to smoke 1 pack a day; discussed risks associated with smoking, patient is not ready to quit.   BMI is Body mass index is 29.32 kg/m., she has been working on diet and exercise. Wt Readings from Last 3 Encounters:  09/02/17 190 lb (86.2 kg)  05/14/17 193 lb (87.5 kg)  01/31/17 190 lb (86.2 kg)   Her blood pressure has been controlled at home, today their BP is BP: 110/70  She does workout occasionally - has seated cycling machine that she does while at her desk. She denies chest pain, shortness of breath, dizziness.   She is on cholesterol medication (crestor 20 mg daily) and  denies myalgias. Her LDL cholesterol is at goal.The cholesterol last visit was:   Lab Results  Component Value Date   CHOL 142 05/14/2017   HDL 41 (L) 05/14/2017   LDLCALC 78 05/14/2017   TRIG 131 05/14/2017   CHOLHDL 3.5 05/14/2017    She has been working on  diet and exercise for glucose management (intermittent hx of prediabetes), and denies foot ulcerations, increased appetite, nausea, paresthesia of the feet, polydipsia, polyuria, visual disturbances, vomiting and weight loss. Last A1C in the office was:  Lab Results  Component Value Date   HGBA1C 5.3 01/31/2017   She is on thyroid medication. Her medication was not changed last visit.   Lab Results  Component Value Date   TSH 0.84 05/14/2017   Patient is on Vitamin D supplement and at goal at recent check:    Lab Results  Component Value Date   VD25OH 65 01/31/2017        Current Medications:  Current Outpatient Medications on File Prior to Visit  Medication Sig  . clopidogrel (PLAVIX) 75 MG tablet TAKE 1 TABLET DAILY  . furosemide (LASIX) 40 MG tablet TAKE 1 TABLET BY MOUTH 2  TIMES DAILY FOR BLOOD  PRESSURE AND FLUID  . labetalol (NORMODYNE) 300 MG tablet Take 1 tablet (300 mg total) by mouth 2 (two) times daily.  Marland Kitchen levETIRAcetam (KEPPRA) 500 MG tablet TAKE 1 TABLET BY MOUTH 2  TIMES DAILY FOR SEIZURES  . losartan (COZAAR) 50 MG tablet Take 50 mg by mouth daily.  . predniSONE (DELTASONE) 20 MG tablet 2 tablets daily for 3 days, 1 tablet daily for 4 days.  . promethazine-dextromethorphan (PROMETHAZINE-DM) 6.25-15 MG/5ML syrup Take 5 mLs by mouth 4 (four) times daily as needed for cough.  . rosuvastatin (CRESTOR) 20 MG tablet TAKE 1 TABLET DAILY FOR  CHOLESTEROL  . SYNTHROID 75 MCG tablet TAKE 1 TABLET DAILY  . Vitamin D, Ergocalciferol, (DRISDOL) 50000 UNITS CAPS capsule TAKE 1 CAPSULE DAILY FOR SEVERE VITAMIN D DEFICIENCY SECONDARY TO MALABSORPTION (Patient taking differently: TAKE 1 CAPSULE EVERY OTHER DAY FOR SEVERE VITAMIN D DEFICIENCY SECONDARY TO MALABSORPTION)   No current facility-administered medications on file prior to visit.      Allergies:  Allergies  Allergen Reactions  . Ace Inhibitors Cough  . Penicillins   . Prednisone     Dysphoria     Medical History:   Past Medical History:  Diagnosis Date  . Vitamin D deficiency    Family history- Reviewed and unchanged Social history- Reviewed and unchanged   Review of Systems:  Review of Systems  Constitutional: Negative for malaise/fatigue and weight loss.  HENT: Negative for hearing loss and tinnitus.   Eyes: Negative for blurred vision and double vision.  Respiratory: Negative for cough, shortness of breath and wheezing.   Cardiovascular: Negative for chest pain, palpitations, orthopnea, claudication and leg swelling.  Gastrointestinal: Negative for abdominal pain, blood in stool, constipation, diarrhea, heartburn, melena, nausea and vomiting.  Genitourinary: Negative.   Musculoskeletal: Negative for joint pain and myalgias.  Skin: Negative for rash.  Neurological: Negative for dizziness, tingling, sensory change, weakness and headaches.  Endo/Heme/Allergies: Negative for polydipsia.  Psychiatric/Behavioral: Negative.   All other systems reviewed and are negative.    Physical Exam: BP 110/70   Pulse (!) 55   Temp (!) 97.3 F (36.3 C)   Ht 5' 7.5" (1.715 m)   Wt 190 lb (86.2 kg)   SpO2 99%   BMI 29.32 kg/m  Wt Readings from  Last 3 Encounters:  09/02/17 190 lb (86.2 kg)  05/14/17 193 lb (87.5 kg)  01/31/17 190 lb (86.2 kg)   General Appearance: Well nourished, in no apparent distress. Eyes: PERRLA, EOMs, conjunctiva no swelling or erythema Sinuses: No Frontal/maxillary tenderness ENT/Mouth: Ext aud canals clear, TMs without erythema, bulging. No erythema, swelling, or exudate on post pharynx.  Tonsils not swollen or erythematous. Hearing normal.  Neck: Supple, thyroid normal.  Respiratory: Respiratory effort normal, BS equal bilaterally without rales, rhonchi, wheezing or stridor.  Cardio: RRR with 3/6 decreshendo systolic murmur. Brisk peripheral pulses with nonpitting edema to bilateral ankles  Abdomen: Soft, + BS.  Non tender, no guarding, rebound, hernias,  masses. Lymphatics: Non tender without lymphadenopathy.  Musculoskeletal: Full ROM, 5/5 strength, Normal gait Skin: Warm, dry; single raised lesion ~1cm, pearly with flaky skin, currently bleeding from excoriation Neuro: Cranial nerves intact. No cerebellar symptoms.  Psych: Awake and oriented X 3, normal affect, Insight and Judgment appropriate.    Izora Ribas, NP 3:51 PM [facility]

## 2017-09-01 ENCOUNTER — Other Ambulatory Visit: Payer: Self-pay | Admitting: Internal Medicine

## 2017-09-01 ENCOUNTER — Other Ambulatory Visit: Payer: Self-pay | Admitting: Physician Assistant

## 2017-09-01 DIAGNOSIS — R569 Unspecified convulsions: Secondary | ICD-10-CM

## 2017-09-02 ENCOUNTER — Encounter: Payer: Self-pay | Admitting: Adult Health

## 2017-09-02 ENCOUNTER — Ambulatory Visit (INDEPENDENT_AMBULATORY_CARE_PROVIDER_SITE_OTHER): Payer: 59 | Admitting: Adult Health

## 2017-09-02 VITALS — BP 110/70 | HR 55 | Temp 97.3°F | Ht 67.5 in | Wt 190.0 lb

## 2017-09-02 DIAGNOSIS — Z79899 Other long term (current) drug therapy: Secondary | ICD-10-CM | POA: Diagnosis not present

## 2017-09-02 DIAGNOSIS — E559 Vitamin D deficiency, unspecified: Secondary | ICD-10-CM | POA: Diagnosis not present

## 2017-09-02 DIAGNOSIS — F17200 Nicotine dependence, unspecified, uncomplicated: Secondary | ICD-10-CM

## 2017-09-02 DIAGNOSIS — E039 Hypothyroidism, unspecified: Secondary | ICD-10-CM | POA: Diagnosis not present

## 2017-09-02 DIAGNOSIS — G40909 Epilepsy, unspecified, not intractable, without status epilepticus: Secondary | ICD-10-CM

## 2017-09-02 DIAGNOSIS — D649 Anemia, unspecified: Secondary | ICD-10-CM | POA: Diagnosis not present

## 2017-09-02 DIAGNOSIS — E782 Mixed hyperlipidemia: Secondary | ICD-10-CM | POA: Diagnosis not present

## 2017-09-02 DIAGNOSIS — N183 Chronic kidney disease, stage 3 (moderate): Secondary | ICD-10-CM | POA: Diagnosis not present

## 2017-09-02 DIAGNOSIS — Z85828 Personal history of other malignant neoplasm of skin: Secondary | ICD-10-CM

## 2017-09-02 DIAGNOSIS — I10 Essential (primary) hypertension: Secondary | ICD-10-CM

## 2017-09-02 DIAGNOSIS — I2510 Atherosclerotic heart disease of native coronary artery without angina pectoris: Secondary | ICD-10-CM

## 2017-09-02 DIAGNOSIS — E663 Overweight: Secondary | ICD-10-CM | POA: Diagnosis not present

## 2017-09-02 DIAGNOSIS — R7303 Prediabetes: Secondary | ICD-10-CM | POA: Diagnosis not present

## 2017-09-02 DIAGNOSIS — N1832 Chronic kidney disease, stage 3b: Secondary | ICD-10-CM

## 2017-09-02 DIAGNOSIS — L989 Disorder of the skin and subcutaneous tissue, unspecified: Secondary | ICD-10-CM

## 2017-09-02 LAB — CBC WITH DIFFERENTIAL/PLATELET
BASOS ABS: 62 {cells}/uL (ref 0–200)
Basophils Relative: 1 %
EOS ABS: 291 {cells}/uL (ref 15–500)
EOS PCT: 4.7 %
HCT: 32.9 % — ABNORMAL LOW (ref 35.0–45.0)
Hemoglobin: 11 g/dL — ABNORMAL LOW (ref 11.7–15.5)
Lymphs Abs: 1854 cells/uL (ref 850–3900)
MCH: 31.3 pg (ref 27.0–33.0)
MCHC: 33.4 g/dL (ref 32.0–36.0)
MCV: 93.5 fL (ref 80.0–100.0)
MONOS PCT: 8.6 %
MPV: 10.4 fL (ref 7.5–12.5)
NEUTROS PCT: 55.8 %
Neutro Abs: 3460 cells/uL (ref 1500–7800)
Platelets: 174 10*3/uL (ref 140–400)
RBC: 3.52 10*6/uL — ABNORMAL LOW (ref 3.80–5.10)
RDW: 12.7 % (ref 11.0–15.0)
TOTAL LYMPHOCYTE: 29.9 %
WBC mixed population: 533 cells/uL (ref 200–950)
WBC: 6.2 10*3/uL (ref 3.8–10.8)

## 2017-09-02 LAB — COMPLETE METABOLIC PANEL WITH GFR
AG Ratio: 1.9 (calc) (ref 1.0–2.5)
ALBUMIN MSPROF: 4.3 g/dL (ref 3.6–5.1)
ALKALINE PHOSPHATASE (APISO): 50 U/L (ref 33–130)
ALT: 7 U/L (ref 6–29)
AST: 13 U/L (ref 10–35)
BILIRUBIN TOTAL: 0.3 mg/dL (ref 0.2–1.2)
BUN / CREAT RATIO: 18 (calc) (ref 6–22)
BUN: 41 mg/dL — AB (ref 7–25)
CHLORIDE: 99 mmol/L (ref 98–110)
CO2: 27 mmol/L (ref 20–32)
CREATININE: 2.26 mg/dL — AB (ref 0.50–1.05)
Calcium: 9.3 mg/dL (ref 8.6–10.4)
GFR, Est African American: 27 mL/min/{1.73_m2} — ABNORMAL LOW (ref >=60)
GFR, Est Non African American: 23 mL/min/{1.73_m2} — ABNORMAL LOW (ref >=60)
GLUCOSE: 87 mg/dL (ref 65–99)
Globulin: 2.3 g/dL (calc) (ref 1.9–3.7)
Potassium: 4.5 mmol/L (ref 3.5–5.3)
SODIUM: 136 mmol/L (ref 135–146)
Total Protein: 6.6 g/dL (ref 6.1–8.1)

## 2017-09-02 LAB — LIPID PANEL
CHOL/HDL RATIO: 4.3 (calc) (ref <5.0)
CHOLESTEROL: 149 mg/dL (ref <200)
HDL: 35 mg/dL — AB (ref >50)
LDL CHOLESTEROL (CALC): 88 mg/dL
Non-HDL Cholesterol (Calc): 114 mg/dL (calc) (ref <130)
Triglycerides: 155 mg/dL — ABNORMAL HIGH (ref <150)

## 2017-09-02 LAB — TSH: TSH: 1.02 mIU/L (ref 0.40–4.50)

## 2017-09-02 NOTE — Patient Instructions (Addendum)
Weight goal for next visit: 185 lb   Aim for 7+ servings of fruits and vegetables daily  80+ fluid ounces of water or unsweet tea for healthy kidneys  Limit alcohol to max 1 drink/day for women, avoid smoking  Limit animal fats in diet for cholesterol and heart health - choose grass fed whenever available  Aim for low stress - take time to unwind and care for your mental health  Aim for 150 min of moderate intensity exercise weekly for heart health, and weights twice weekly for bone health  Aim for 7-9 hours of sleep daily      When it comes to diets, agreement about the perfect plan isn't easy to find, even among the experts. Experts at the [employer] developed an idea known as the Healthy Eating Plate. Just imagine a plate divided into logical, healthy portions.  The emphasis is on diet quality:  Load up on vegetables and fruits - one-half of your plate: Aim for color and variety, and remember that potatoes don't count.  Go for whole grains - one-quarter of your plate: Whole wheat, barley, wheat berries, quinoa, oats, brown rice, and foods made with them. If you want pasta, go with whole wheat pasta.  Protein power - one-quarter of your plate: Fish, chicken, beans, and nuts are all healthy, versatile protein sources. Limit red meat.  The diet, however, does go beyond the plate, offering a few other suggestions.  Use healthy plant oils, such as olive, canola, soy, corn, sunflower and peanut. Check the labels, and avoid partially hydrogenated oil, which have unhealthy trans fats.  If you're thirsty, drink water. Coffee and tea are good in moderation, but skip sugary drinks and limit milk and dairy products to one or two daily servings.  The type of carbohydrate in the diet is more important than the amount. Some sources of carbohydrates, such as vegetables, fruits, whole grains, and beans-are healthier than others.  Finally, stay active.

## 2017-09-03 ENCOUNTER — Encounter: Payer: Self-pay | Admitting: Adult Health

## 2017-09-03 ENCOUNTER — Encounter: Payer: Self-pay | Admitting: Internal Medicine

## 2017-09-23 ENCOUNTER — Other Ambulatory Visit: Payer: Self-pay | Admitting: Internal Medicine

## 2017-09-23 DIAGNOSIS — E039 Hypothyroidism, unspecified: Secondary | ICD-10-CM

## 2017-10-18 ENCOUNTER — Encounter: Payer: Self-pay | Admitting: Cardiology

## 2017-10-18 ENCOUNTER — Ambulatory Visit (INDEPENDENT_AMBULATORY_CARE_PROVIDER_SITE_OTHER): Payer: 59 | Admitting: Cardiology

## 2017-10-18 VITALS — BP 126/60 | HR 54 | Ht 67.5 in | Wt 191.0 lb

## 2017-10-18 DIAGNOSIS — E782 Mixed hyperlipidemia: Secondary | ICD-10-CM

## 2017-10-18 DIAGNOSIS — I2510 Atherosclerotic heart disease of native coronary artery without angina pectoris: Secondary | ICD-10-CM | POA: Diagnosis not present

## 2017-10-18 DIAGNOSIS — E039 Hypothyroidism, unspecified: Secondary | ICD-10-CM

## 2017-10-18 DIAGNOSIS — F17200 Nicotine dependence, unspecified, uncomplicated: Secondary | ICD-10-CM

## 2017-10-18 DIAGNOSIS — I10 Essential (primary) hypertension: Secondary | ICD-10-CM

## 2017-10-18 DIAGNOSIS — N184 Chronic kidney disease, stage 4 (severe): Secondary | ICD-10-CM | POA: Diagnosis not present

## 2017-10-18 NOTE — Patient Instructions (Signed)
Medication Instructions: Your physician recommends that you continue on your current medications as directed. Please refer to the Current Medication list given to you today.\  Labwork: None ordered  Procedures/Testing: None ordered  Follow-Up: Your physician wants you to follow-up in: 1 year with Dr.Nahser  You will receive a reminder letter in the mail two months in advance. If you don't receive a letter, please call our office to schedule the follow-up appointment.   Any Additional Special Instructions Will Be Listed Below (If Applicable).    DASH Eating Plan DASH stands for "Dietary Approaches to Stop Hypertension." The DASH eating plan is a healthy eating plan that has been shown to reduce high blood pressure (hypertension). It may also reduce your risk for type 2 diabetes, heart disease, and stroke. The DASH eating plan may also help with weight loss. What are tips for following this plan? General guidelines  Avoid eating more than 2,300 mg (milligrams) of salt (sodium) a day. If you have hypertension, you may need to reduce your sodium intake to 1,500 mg a day.  Limit alcohol intake to no more than 1 drink a day for nonpregnant women and 2 drinks a day for men. One drink equals 12 oz of beer, 5 oz of wine, or 1 oz of hard liquor.  Work with your health care provider to maintain a healthy body weight or to lose weight. Ask what an ideal weight is for you.  Get at least 30 minutes of exercise that causes your heart to beat faster (aerobic exercise) most days of the week. Activities may include walking, swimming, or biking.  Work with your health care provider or diet and nutrition specialist (dietitian) to adjust your eating plan to your individual calorie needs. Reading food labels  Check food labels for the amount of sodium per serving. Choose foods with less than 5 percent of the Daily Value of sodium. Generally, foods with less than 300 mg of sodium per serving fit into this  eating plan.  To find whole grains, look for the word "whole" as the first word in the ingredient list. Shopping  Buy products labeled as "low-sodium" or "no salt added."  Buy fresh foods. Avoid canned foods and premade or frozen meals. Cooking  Avoid adding salt when cooking. Use salt-free seasonings or herbs instead of table salt or sea salt. Check with your health care provider or pharmacist before using salt substitutes.  Do not fry foods. Cook foods using healthy methods such as baking, boiling, grilling, and broiling instead.  Cook with heart-healthy oils, such as olive, canola, soybean, or sunflower oil. Meal planning   Eat a balanced diet that includes: ? 5 or more servings of fruits and vegetables each day. At each meal, try to fill half of your plate with fruits and vegetables. ? Up to 6-8 servings of whole grains each day. ? Less than 6 oz of lean meat, poultry, or fish each day. A 3-oz serving of meat is about the same size as a deck of cards. One egg equals 1 oz. ? 2 servings of low-fat dairy each day. ? A serving of nuts, seeds, or beans 5 times each week. ? Heart-healthy fats. Healthy fats called Omega-3 fatty acids are found in foods such as flaxseeds and coldwater fish, like sardines, salmon, and mackerel.  Limit how much you eat of the following: ? Canned or prepackaged foods. ? Food that is high in trans fat, such as fried foods. ? Food that is high in  saturated fat, such as fatty meat. ? Sweets, desserts, sugary drinks, and other foods with added sugar. ? Full-fat dairy products.  Do not salt foods before eating.  Try to eat at least 2 vegetarian meals each week.  Eat more home-cooked food and less restaurant, buffet, and fast food.  When eating at a restaurant, ask that your food be prepared with less salt or no salt, if possible. What foods are recommended? The items listed may not be a complete list. Talk with your dietitian about what dietary choices  are best for you. Grains Whole-grain or whole-wheat bread. Whole-grain or whole-wheat pasta. Brown rice. [place]ena Morrow. Bulgur. Whole-grain and low-sodium cereals. Pita bread. Low-fat, low-sodium crackers. Whole-wheat flour tortillas. Vegetables Fresh or frozen vegetables (raw, steamed, roasted, or grilled). Low-sodium or reduced-sodium tomato and vegetable juice. Low-sodium or reduced-sodium tomato sauce and tomato paste. Low-sodium or reduced-sodium canned vegetables. Fruits All fresh, dried, or frozen fruit. Canned fruit in natural juice (without added sugar). Meat and other protein foods Skinless chicken or [place]. Ground chicken or [place]. Pork with fat trimmed off. Fish and seafood. Egg whites. Dried beans, peas, or lentils. Unsalted nuts, nut butters, and seeds. Unsalted canned beans. Lean cuts of beef with fat trimmed off. Low-sodium, lean deli meat. Dairy Low-fat (1%) or fat-free (skim) milk. Fat-free, low-fat, or reduced-fat cheeses. Nonfat, low-sodium ricotta or cottage cheese. Low-fat or nonfat yogurt. Low-fat, low-sodium cheese. Fats and oils Soft margarine without trans fats. Vegetable oil. Low-fat, reduced-fat, or light mayonnaise and salad dressings (reduced-sodium). Canola, safflower, olive, soybean, and sunflower oils. Avocado. Seasoning and other foods Herbs. Spices. Seasoning mixes without salt. Unsalted popcorn and pretzels. Fat-free sweets. What foods are not recommended? The items listed may not be a complete list. Talk with your dietitian about what dietary choices are best for you. Grains Baked goods made with fat, such as croissants, muffins, or some breads. Dry pasta or rice meal packs. Vegetables Creamed or fried vegetables. Vegetables in a cheese sauce. Regular canned vegetables (not low-sodium or reduced-sodium). Regular canned tomato sauce and paste (not low-sodium or reduced-sodium). Regular tomato and vegetable juice (not low-sodium or reduced-sodium). Angie Fava.  Olives. Fruits Canned fruit in a light or heavy syrup. Fried fruit. Fruit in cream or butter sauce. Meat and other protein foods Fatty cuts of meat. Ribs. Fried meat. Berniece Salines. Sausage. Bologna and other processed lunch meats. Salami. Fatback. Hotdogs. Bratwurst. Salted nuts and seeds. Canned beans with added salt. Canned or smoked fish. Whole eggs or egg yolks. Chicken or [place] with skin. Dairy Whole or 2% milk, cream, and half-and-half. Whole or full-fat cream cheese. Whole-fat or sweetened yogurt. Full-fat cheese. Nondairy creamers. Whipped toppings. Processed cheese and cheese spreads. Fats and oils Butter. Stick margarine. Lard. Shortening. Ghee. Bacon fat. Tropical oils, such as coconut, palm kernel, or palm oil. Seasoning and other foods Salted popcorn and pretzels. Onion salt, garlic salt, seasoned salt, table salt, and sea salt. Worcestershire sauce. Tartar sauce. Barbecue sauce. Teriyaki sauce. Soy sauce, including reduced-sodium. Steak sauce. Canned and packaged gravies. Fish sauce. Oyster sauce. Cocktail sauce. Horseradish that you find on the shelf. Ketchup. Mustard. Meat flavorings and tenderizers. Bouillon cubes. Hot sauce and Tabasco sauce. Premade or packaged marinades. Premade or packaged taco seasonings. Relishes. Regular salad dressings. Where to find more information:  National Heart, Lung, and [place]: https://wilson-eaton.com/  American Heart Association: www.heart.org Summary  The DASH eating plan is a healthy eating plan that has been shown to reduce high blood pressure (hypertension). It may also  reduce your risk for type 2 diabetes, heart disease, and stroke.  With the DASH eating plan, you should limit salt (sodium) intake to 2,300 mg a day. If you have hypertension, you may need to reduce your sodium intake to 1,500 mg a day.  When on the DASH eating plan, aim to eat more fresh fruits and vegetables, whole grains, lean proteins, low-fat dairy, and heart-healthy  fats.  Work with your health care provider or diet and nutrition specialist (dietitian) to adjust your eating plan to your individual calorie needs. This information is not intended to replace advice given to you by your health care provider. Make sure you discuss any questions you have with your health care provider. Document Released: 02/01/2011 Document Revised: 02/06/2016 Document Reviewed: 02/06/2016 Elsevier Interactive Patient Education  2018 [employer].     Steps to Quit Smoking Smoking tobacco can be bad for your health. It can also affect almost every organ in your body. Smoking puts you and people around you at risk for many serious long-lasting (chronic) diseases. Quitting smoking is hard, but it is one of the best things that you can do for your health. It is never too late to quit. What are the benefits of quitting smoking? When you quit smoking, you lower your risk for getting serious diseases and conditions. They can include:  Lung cancer or lung disease.  Heart disease.  Stroke.  Heart attack.  Not being able to have children (infertility).  Weak bones (osteoporosis) and broken bones (fractures).  If you have coughing, wheezing, and shortness of breath, those symptoms may get better when you quit. You may also get sick less often. If you are pregnant, quitting smoking can help to lower your chances of having a baby of low birth weight. What can I do to help me quit smoking? Talk with your doctor about what can help you quit smoking. Some things you can do (strategies) include:  Quitting smoking totally, instead of slowly cutting back how much you smoke over a period of time.  Going to in-person counseling. You are more likely to quit if you go to many counseling sessions.  Using resources and support systems, such as: ? Database administrator with a Social worker. ? Phone quitlines. ? Careers information officer. ? Support groups or group counseling. ? Text messaging  programs. ? Mobile phone apps or applications.  Taking medicines. Some of these medicines may have nicotine in them. If you are pregnant or breastfeeding, do not take any medicines to quit smoking unless your doctor says it is okay. Talk with your doctor about counseling or other things that can help you.  Talk with your doctor about using more than one strategy at the same time, such as taking medicines while you are also going to in-person counseling. This can help make quitting easier. What things can I do to make it easier to quit? Quitting smoking might feel very hard at first, but there is a lot that you can do to make it easier. Take these steps:  Talk to your family and friends. Ask them to support and encourage you.  Call phone quitlines, reach out to support groups, or work with a Social worker.  Ask people who smoke to not smoke around you.  Avoid places that make you want (trigger) to smoke, such as: ? Bars. ? Parties. ? Smoke-break areas at work.  Spend time with people who do not smoke.  Lower the stress in your life. Stress can make you want  to smoke. Try these things to help your stress: ? Getting regular exercise. ? Deep-breathing exercises. ? Yoga. ? Meditating. ? Doing a body scan. To do this, close your eyes, focus on one area of your body at a time from head to toe, and notice which parts of your body are tense. Try to relax the muscles in those areas.  Download or buy apps on your mobile phone or tablet that can help you stick to your quit plan. There are many free apps, such as QuitGuide from the [employer] Office manager for Disease Control and Prevention). You can find more support from smokefree.gov and other websites.  This information is not intended to replace advice given to you by your health care provider. Make sure you discuss any questions you have with your health care provider. Document Released: 12/09/2008 Document Revised: 10/11/2015 Document Reviewed:  06/29/2014 Elsevier Interactive Patient Education  [employer].     If you need a refill on your cardiac medications before your next appointment, please call your pharmacy.

## 2017-10-18 NOTE — Progress Notes (Signed)
Cardiology Office Note:    Date:  10/18/2017   ID:  [name], DOB April 21, 1960, MRN [id number]  PCP:  [name], MD  Cardiologist:  Mertie Moores, MD  Referring MD: [name], MD   Chief Complaint  Patient presents with  . Follow-up    CAD    History of Present Illness:    Olivia Walsh is a 57 y.o. female with a past medical history significant for CAD s/p CABG at age 14, HTN, HLD, heavy smoker, hypothyroidism and cerebral aneurysms with prior coiling and subarachnoid hemorrhage 2005, PVD with bilateral iliac stents 2011(per notes from [facility], pt denies), seizure disorder.   She works from home, seated at a desk all day. She has a desk pedal bike that she uses during the day. She does not do any other exercise. She has drop foot on the left which makes it hard to walk. She denies any chest pain/pressure/tightness, shortness of breath, orthopnea, PND, palpitations, or lightheadedness.  She has trace chronic left leg edema- site of vein harvest.  She does get mild leg pain with walking, L>R. She continues to smoke and she and her husband both smoke in the house. She has the strong odor of cigarette smoke on her.   Past Medical History:  Diagnosis Date  . CAD (coronary artery disease)   . CKD (chronic kidney disease) stage 3, GFR 30-59 ml/min (HCC)   . Hyperlipidemia LDL goal <70   . Hypertension   . Hypothyroidism   . Tobacco abuse   . Vitamin D deficiency     Past Surgical History:  Procedure Laterality Date  . CEREBRAL ANEURYSM REPAIR Right 2012  . CEREBRAL ANEURYSM REPAIR Right 2005  . CORONARY ARTERY BYPASS GRAFT  2002   Dr Cyndia Bent  . EMBOLIZATION  2005   Embolization of right PICA aneurysm    Current Medications: Current Meds  Medication Sig  . clopidogrel (PLAVIX) 75 MG tablet TAKE 1 TABLET BY MOUTH  DAILY  . furosemide (LASIX) 40 MG tablet TAKE 1 TABLET BY MOUTH 2  TIMES DAILY FOR BLOOD  PRESSURE AND FLUID  . labetalol (NORMODYNE) 300 MG  tablet Take 1 tablet (300 mg total) by mouth 2 (two) times daily.  Marland Kitchen levETIRAcetam (KEPPRA) 500 MG tablet TAKE 1 TABLET BY MOUTH 2  TIMES DAILY FOR SEIZURES  . losartan (COZAAR) 50 MG tablet Take 50 mg by mouth daily.  . promethazine-dextromethorphan (PROMETHAZINE-DM) 6.25-15 MG/5ML syrup Take 5 mLs by mouth 4 (four) times daily as needed for cough.  . rosuvastatin (CRESTOR) 20 MG tablet TAKE 1 TABLET DAILY FOR  CHOLESTEROL  . SYNTHROID 75 MCG tablet TAKE 1 TABLET BY MOUTH  DAILY  . Vitamin D, Ergocalciferol, (DRISDOL) 50000 UNITS CAPS capsule TAKE 1 CAPSULE DAILY FOR SEVERE VITAMIN D DEFICIENCY SECONDARY TO MALABSORPTION (Patient taking differently: TAKE 1 CAPSULE EVERY OTHER DAY FOR SEVERE VITAMIN D DEFICIENCY SECONDARY TO MALABSORPTION)     Allergies:   Ace inhibitors; Penicillins; and Prednisone   Social History   Socioeconomic History  . Marital status: Married    Spouse name: Not on file  . Number of children: Not on file  . Years of education: Not on file  . Highest education level: Not on file  Occupational History  . Not on file  Social Needs  . Financial resource strain: Not on file  . Food insecurity:    Worry: Not on file    Inability: Not on file  . Transportation needs:  Medical: Not on file    Non-medical: Not on file  Tobacco Use  . Smoking status: Current Every Day Smoker    Packs/day: 1.00    Years: 40.00    Pack years: 40.00    Types: Cigarettes  . Smokeless tobacco: Never Used  . Tobacco comment: 1/2-2 ppd   Substance and Sexual Activity  . Alcohol use: No  . Drug use: No  . Sexual activity: Yes  Lifestyle  . Physical activity:    Days per week: Not on file    Minutes per session: Not on file  . Stress: Not on file  Relationships  . Social connections:    Talks on phone: Not on file    Gets together: Not on file    Attends religious service: Not on file    Active member of club or organization: Not on file    Attends meetings of clubs or  organizations: Not on file    Relationship status: Not on file  Other Topics Concern  . Not on file  Social History Narrative  . Not on file     Family History: The patient's family history includes Cancer in her father; Heart attack in her brother; Heart disease in her brother and mother; Leukemia in her sister. ROS:   Please see the history of present illness.     All other systems reviewed and are negative.  EKGs/Labs/Other Studies Reviewed:    The following studies were reviewed today:  Echocardiogram 09/17/2013 Study Conclusions - Left ventricle: E/e&quot;>14.3 suggestive of elevated LV filling pressures. The cavity size was normal. There was moderate focal basal hypertrophy. Systolic function was vigorous. The estimated ejection fraction was in the range of 65% to 70%. Wall motion was normal; there were no regional wall motion abnormalities. There was an increased relative contribution of atrial contraction to ventricular filling. - Aortic valve: Moderate focal calcification. - Aorta: Ascending aortic diameter: 38 mm (S). - Ascending aorta: The ascending aorta was mildly dilated. - Mitral valve: There was trivial regurgitation. - Left atrium: The atrium was mildly dilated. - Tricuspid valve: There was mild regurgitation.  EKG:  EKG is ordered today.  The ekg ordered today demonstrates Sinus bradycardia, 54 bpm, with persistent old ST elevation in lead III.   Recent Labs: 09/02/2017: ALT 7; BUN 41; Creat 2.26; Hemoglobin 11.0; Platelets 174; Potassium 4.5; Sodium 136; TSH 1.02   Recent Lipid Panel    Component Value Date/Time   CHOL 149 09/02/2017 1559   TRIG 155 (H) 09/02/2017 1559   HDL 35 (L) 09/02/2017 1559   CHOLHDL 4.3 09/02/2017 1559   VLDL 30 09/26/2016 1505   LDLCALC 88 09/02/2017 1559    Physical Exam:    VS:  BP 126/60   Pulse (!) 54   Ht 5' 7.5" (1.715 m)   Wt 191 lb (86.6 kg)   BMI 29.47 kg/m     Wt Readings from Last 3 Encounters:   10/18/17 191 lb (86.6 kg)  09/02/17 190 lb (86.2 kg)  05/14/17 193 lb (87.5 kg)     Physical Exam  Constitutional: She is oriented to person, place, and time. She appears well-developed and well-nourished. No distress.  HENT:  Head: Normocephalic and atraumatic.  Neck: Normal range of motion. Neck supple. No JVD present.  Cardiovascular: Normal rate, regular rhythm and intact distal pulses. Exam reveals no gallop and no friction rub.  Murmur heard.  Harsh midsystolic murmur is present with a grade of 2/6 at the upper  right sternal border. Pulmonary/Chest: Effort normal and breath sounds normal. No respiratory distress. She has no wheezes. She has no rales.  Abdominal: Soft. Bowel sounds are normal.  Musculoskeletal: Normal range of motion. She exhibits edema.  Trace left lower leg.  Neurological: She is alert and oriented to person, place, and time.  Skin: Skin is warm and dry.  Psychiatric: She has a normal mood and affect. Her behavior is normal. Judgment and thought content normal.  Vitals reviewed.    ASSESSMENT:    1. Coronary artery disease involving native coronary artery of native heart without angina pectoris   2. Essential hypertension   3. Hyperlipidemia   4. CKD (chronic kidney disease) stage 4, GFR 15-29 ml/min (HCC)   5. SMOKER   6. Hypothyroidism, unspecified type    PLAN:    In order of problems listed above:  CAD: Hx of premature CAD with CABG X4 at age 80. On Plavix, BB, ARB, statin. No anginal symptoms. Continue current therapy.  Hypertension: BP is well controlled on current medications. No changes  Hyperlipidemia: On crestor 20 mg. LDL 88 on 09/02/17. Continue current therapy.   CKD stage IV: Hx of left RAS with non-functioning kidney. Recent worsening of renal function with SCr 2.26, GFR 23 on 09/02/17. Management per primary care and nephrology, followed by Dr. Moshe Cipro. She was taking Goody powders.  Advised to avoid NSAIDs which she is doing.    Tobacco abuse: She expresses that she is aware of the effects of smoking on her blood vessels and health. Thus far has she has not been able to come to the decision to try cessation. We had a long discussion on the direct effects of smoking on the blood vessels and discussed some tips to help her quit. Her husband also smokes. She will contemplate cessation and discuss possibly trying chantix with her PCP.   Hypothyroid: managed by PCP. TSH 1.02.   Follow up in 1 year with Dr. Acie Fredrickson. Pt advised to call sooner if she develops any new symptoms.   Medication Adjustments/Labs and Tests Ordered: Current medicines are reviewed at length with the patient today.  Concerns regarding medicines are outlined above. Labs and tests ordered and medication changes are outlined in the patient instructions below:  Patient Instructions  Medication Instructions: Your physician recommends that you continue on your current medications as directed. Please refer to the Current Medication list given to you today.\  Labwork: None ordered  Procedures/Testing: None ordered  Follow-Up: Your physician wants you to follow-up in: 1 year with Dr.Nahser  You will receive a reminder letter in the mail two months in advance. If you don't receive a letter, please call our office to schedule the follow-up appointment.   Any Additional Special Instructions Will Be Listed Below (If Applicable).    DASH Eating Plan DASH stands for "Dietary Approaches to Stop Hypertension." The DASH eating plan is a healthy eating plan that has been shown to reduce high blood pressure (hypertension). It may also reduce your risk for type 2 diabetes, heart disease, and stroke. The DASH eating plan may also help with weight loss. What are tips for following this plan? General guidelines  Avoid eating more than 2,300 mg (milligrams) of salt (sodium) a day. If you have hypertension, you may need to reduce your sodium intake to 1,500 mg a  day.  Limit alcohol intake to no more than 1 drink a day for nonpregnant women and 2 drinks a day for men. One drink equals  12 oz of beer, 5 oz of wine, or 1 oz of hard liquor.  Work with your health care provider to maintain a healthy body weight or to lose weight. Ask what an ideal weight is for you.  Get at least 30 minutes of exercise that causes your heart to beat faster (aerobic exercise) most days of the week. Activities may include walking, swimming, or biking.  Work with your health care provider or diet and nutrition specialist (dietitian) to adjust your eating plan to your individual calorie needs. Reading food labels  Check food labels for the amount of sodium per serving. Choose foods with less than 5 percent of the Daily Value of sodium. Generally, foods with less than 300 mg of sodium per serving fit into this eating plan.  To find whole grains, look for the word "whole" as the first word in the ingredient list. Shopping  Buy products labeled as "low-sodium" or "no salt added."  Buy fresh foods. Avoid canned foods and premade or frozen meals. Cooking  Avoid adding salt when cooking. Use salt-free seasonings or herbs instead of table salt or sea salt. Check with your health care provider or pharmacist before using salt substitutes.  Do not fry foods. Cook foods using healthy methods such as baking, boiling, grilling, and broiling instead.  Cook with heart-healthy oils, such as olive, canola, soybean, or sunflower oil. Meal planning   Eat a balanced diet that includes: ? 5 or more servings of fruits and vegetables each day. At each meal, try to fill half of your plate with fruits and vegetables. ? Up to 6-8 servings of whole grains each day. ? Less than 6 oz of lean meat, poultry, or fish each day. A 3-oz serving of meat is about the same size as a deck of cards. One egg equals 1 oz. ? 2 servings of low-fat dairy each day. ? A serving of nuts, seeds, or beans 5 times  each week. ? Heart-healthy fats. Healthy fats called Omega-3 fatty acids are found in foods such as flaxseeds and coldwater fish, like sardines, salmon, and mackerel.  Limit how much you eat of the following: ? Canned or prepackaged foods. ? Food that is high in trans fat, such as fried foods. ? Food that is high in saturated fat, such as fatty meat. ? Sweets, desserts, sugary drinks, and other foods with added sugar. ? Full-fat dairy products.  Do not salt foods before eating.  Try to eat at least 2 vegetarian meals each week.  Eat more home-cooked food and less restaurant, buffet, and fast food.  When eating at a restaurant, ask that your food be prepared with less salt or no salt, if possible. What foods are recommended? The items listed may not be a complete list. Talk with your dietitian about what dietary choices are best for you. Grains Whole-grain or whole-wheat bread. Whole-grain or whole-wheat pasta. Brown rice. [place]. Bulgur. Whole-grain and low-sodium cereals. Pita bread. Low-fat, low-sodium crackers. Whole-wheat flour tortillas. Vegetables Fresh or frozen vegetables (raw, steamed, roasted, or grilled). Low-sodium or reduced-sodium tomato and vegetable juice. Low-sodium or reduced-sodium tomato sauce and tomato paste. Low-sodium or reduced-sodium canned vegetables. Fruits All fresh, dried, or frozen fruit. Canned fruit in natural juice (without added sugar). Meat and other protein foods Skinless chicken or [place]. Ground chicken or [place]. Pork with fat trimmed off. Fish and seafood. Egg whites. Dried beans, peas, or lentils. Unsalted nuts, nut butters, and seeds. Unsalted canned beans. Lean cuts of beef  with fat trimmed off. Low-sodium, lean deli meat. Dairy Low-fat (1%) or fat-free (skim) milk. Fat-free, low-fat, or reduced-fat cheeses. Nonfat, low-sodium ricotta or cottage cheese. Low-fat or nonfat yogurt. Low-fat, low-sodium cheese. Fats and oils Soft margarine  without trans fats. Vegetable oil. Low-fat, reduced-fat, or light mayonnaise and salad dressings (reduced-sodium). Canola, safflower, olive, soybean, and sunflower oils. Avocado. Seasoning and other foods Herbs. Spices. Seasoning mixes without salt. Unsalted popcorn and pretzels. Fat-free sweets. What foods are not recommended? The items listed may not be a complete list. Talk with your dietitian about what dietary choices are best for you. Grains Baked goods made with fat, such as croissants, muffins, or some breads. Dry pasta or rice meal packs. Vegetables Creamed or fried vegetables. Vegetables in a cheese sauce. Regular canned vegetables (not low-sodium or reduced-sodium). Regular canned tomato sauce and paste (not low-sodium or reduced-sodium). Regular tomato and vegetable juice (not low-sodium or reduced-sodium). Angie Fava. Olives. Fruits Canned fruit in a light or heavy syrup. Fried fruit. Fruit in cream or butter sauce. Meat and other protein foods Fatty cuts of meat. Ribs. Fried meat. Berniece Salines. Sausage. Bologna and other processed lunch meats. Salami. Fatback. Hotdogs. Bratwurst. Salted nuts and seeds. Canned beans with added salt. Canned or smoked fish. Whole eggs or egg yolks. Chicken or [place] with skin. Dairy Whole or 2% milk, cream, and half-and-half. Whole or full-fat cream cheese. Whole-fat or sweetened yogurt. Full-fat cheese. Nondairy creamers. Whipped toppings. Processed cheese and cheese spreads. Fats and oils Butter. Stick margarine. Lard. Shortening. Ghee. Bacon fat. Tropical oils, such as coconut, palm kernel, or palm oil. Seasoning and other foods Salted popcorn and pretzels. Onion salt, garlic salt, seasoned salt, table salt, and sea salt. Worcestershire sauce. Tartar sauce. Barbecue sauce. Teriyaki sauce. Soy sauce, including reduced-sodium. Steak sauce. Canned and packaged gravies. Fish sauce. Oyster sauce. Cocktail sauce. Horseradish that you find on the shelf. Ketchup.  Mustard. Meat flavorings and tenderizers. Bouillon cubes. Hot sauce and Tabasco sauce. Premade or packaged marinades. Premade or packaged taco seasonings. Relishes. Regular salad dressings. Where to find more information:  National Heart, Lung, and [place]: https://wilson-eaton.com/  American Heart Association: www.heart.org Summary  The DASH eating plan is a healthy eating plan that has been shown to reduce high blood pressure (hypertension). It may also reduce your risk for type 2 diabetes, heart disease, and stroke.  With the DASH eating plan, you should limit salt (sodium) intake to 2,300 mg a day. If you have hypertension, you may need to reduce your sodium intake to 1,500 mg a day.  When on the DASH eating plan, aim to eat more fresh fruits and vegetables, whole grains, lean proteins, low-fat dairy, and heart-healthy fats.  Work with your health care provider or diet and nutrition specialist (dietitian) to adjust your eating plan to your individual calorie needs. This information is not intended to replace advice given to you by your health care provider. Make sure you discuss any questions you have with your health care provider. Document Released: 02/01/2011 Document Revised: 02/06/2016 Document Reviewed: 02/06/2016 Elsevier Interactive Patient Education  2018 [employer].     Steps to Quit Smoking Smoking tobacco can be bad for your health. It can also affect almost every organ in your body. Smoking puts you and people around you at risk for many serious long-lasting (chronic) diseases. Quitting smoking is hard, but it is one of the best things that you can do for your health. It is never too late to quit. What are the  benefits of quitting smoking? When you quit smoking, you lower your risk for getting serious diseases and conditions. They can include:  Lung cancer or lung disease.  Heart disease.  Stroke.  Heart attack.  Not being able to have children  (infertility).  Weak bones (osteoporosis) and broken bones (fractures).  If you have coughing, wheezing, and shortness of breath, those symptoms may get better when you quit. You may also get sick less often. If you are pregnant, quitting smoking can help to lower your chances of having a baby of low birth weight. What can I do to help me quit smoking? Talk with your doctor about what can help you quit smoking. Some things you can do (strategies) include:  Quitting smoking totally, instead of slowly cutting back how much you smoke over a period of time.  Going to in-person counseling. You are more likely to quit if you go to many counseling sessions.  Using resources and support systems, such as: ? Database administrator with a Social worker. ? Phone quitlines. ? Careers information officer. ? Support groups or group counseling. ? Text messaging programs. ? Mobile phone apps or applications.  Taking medicines. Some of these medicines may have nicotine in them. If you are pregnant or breastfeeding, do not take any medicines to quit smoking unless your doctor says it is okay. Talk with your doctor about counseling or other things that can help you.  Talk with your doctor about using more than one strategy at the same time, such as taking medicines while you are also going to in-person counseling. This can help make quitting easier. What things can I do to make it easier to quit? Quitting smoking might feel very hard at first, but there is a lot that you can do to make it easier. Take these steps:  Talk to your family and friends. Ask them to support and encourage you.  Call phone quitlines, reach out to support groups, or work with a Social worker.  Ask people who smoke to not smoke around you.  Avoid places that make you want (trigger) to smoke, such as: ? Bars. ? Parties. ? Smoke-break areas at work.  Spend time with people who do not smoke.  Lower the stress in your life. Stress can make you  want to smoke. Try these things to help your stress: ? Getting regular exercise. ? Deep-breathing exercises. ? Yoga. ? Meditating. ? Doing a body scan. To do this, close your eyes, focus on one area of your body at a time from head to toe, and notice which parts of your body are tense. Try to relax the muscles in those areas.  Download or buy apps on your mobile phone or tablet that can help you stick to your quit plan. There are many free apps, such as QuitGuide from the [employer] Office manager for Disease Control and Prevention). You can find more support from smokefree.gov and other websites.  This information is not intended to replace advice given to you by your health care provider. Make sure you discuss any questions you have with your health care provider. Document Released: 12/09/2008 Document Revised: 10/11/2015 Document Reviewed: 06/29/2014 Elsevier Interactive Patient Education  [employer].     If you need a refill on your cardiac medications before your next appointment, please call your pharmacy.      Signed, Daune Perch, NP  10/18/2017 4:22 PM    [place]

## 2017-10-23 ENCOUNTER — Other Ambulatory Visit: Payer: Self-pay | Admitting: Physician Assistant

## 2017-10-23 DIAGNOSIS — I10 Essential (primary) hypertension: Secondary | ICD-10-CM

## 2017-10-25 ENCOUNTER — Other Ambulatory Visit: Payer: Self-pay | Admitting: Internal Medicine

## 2017-10-25 DIAGNOSIS — E785 Hyperlipidemia, unspecified: Secondary | ICD-10-CM

## 2017-11-05 DIAGNOSIS — H25012 Cortical age-related cataract, left eye: Secondary | ICD-10-CM | POA: Diagnosis not present

## 2017-11-05 DIAGNOSIS — H2513 Age-related nuclear cataract, bilateral: Secondary | ICD-10-CM | POA: Diagnosis not present

## 2017-11-08 ENCOUNTER — Other Ambulatory Visit: Payer: Self-pay | Admitting: Internal Medicine

## 2017-11-08 DIAGNOSIS — R569 Unspecified convulsions: Secondary | ICD-10-CM

## 2017-11-22 DIAGNOSIS — H25043 Posterior subcapsular polar age-related cataract, bilateral: Secondary | ICD-10-CM | POA: Diagnosis not present

## 2017-11-22 DIAGNOSIS — H2512 Age-related nuclear cataract, left eye: Secondary | ICD-10-CM | POA: Diagnosis not present

## 2017-11-22 DIAGNOSIS — H25013 Cortical age-related cataract, bilateral: Secondary | ICD-10-CM | POA: Diagnosis not present

## 2017-11-22 DIAGNOSIS — H2513 Age-related nuclear cataract, bilateral: Secondary | ICD-10-CM | POA: Diagnosis not present

## 2017-12-05 DIAGNOSIS — N183 Chronic kidney disease, stage 3 (moderate): Secondary | ICD-10-CM | POA: Diagnosis not present

## 2017-12-09 ENCOUNTER — Ambulatory Visit: Payer: Self-pay | Admitting: Adult Health

## 2017-12-09 DIAGNOSIS — I129 Hypertensive chronic kidney disease with stage 1 through stage 4 chronic kidney disease, or unspecified chronic kidney disease: Secondary | ICD-10-CM | POA: Diagnosis not present

## 2017-12-09 DIAGNOSIS — N183 Chronic kidney disease, stage 3 (moderate): Secondary | ICD-10-CM | POA: Diagnosis not present

## 2017-12-09 DIAGNOSIS — N140 Analgesic nephropathy: Secondary | ICD-10-CM | POA: Diagnosis not present

## 2017-12-10 DIAGNOSIS — C44612 Basal cell carcinoma of skin of right upper limb, including shoulder: Secondary | ICD-10-CM | POA: Diagnosis not present

## 2017-12-10 DIAGNOSIS — C44719 Basal cell carcinoma of skin of left lower limb, including hip: Secondary | ICD-10-CM | POA: Diagnosis not present

## 2017-12-10 DIAGNOSIS — D485 Neoplasm of uncertain behavior of skin: Secondary | ICD-10-CM | POA: Diagnosis not present

## 2017-12-10 DIAGNOSIS — L728 Other follicular cysts of the skin and subcutaneous tissue: Secondary | ICD-10-CM | POA: Diagnosis not present

## 2017-12-10 DIAGNOSIS — L814 Other melanin hyperpigmentation: Secondary | ICD-10-CM | POA: Diagnosis not present

## 2017-12-20 ENCOUNTER — Encounter: Payer: Self-pay | Admitting: Adult Health

## 2017-12-20 DIAGNOSIS — Z9889 Other specified postprocedural states: Secondary | ICD-10-CM

## 2017-12-20 DIAGNOSIS — Z85828 Personal history of other malignant neoplasm of skin: Secondary | ICD-10-CM

## 2017-12-20 HISTORY — DX: Other specified postprocedural states: Z85.828

## 2017-12-20 HISTORY — DX: Other specified postprocedural states: Z98.890

## 2017-12-20 NOTE — Progress Notes (Signed)
FOLLOW UP  Assessment and Plan:    CAD Continue plavix, ASA Control blood pressure, cholesterol, glucose, increase exercise.  Continue cardiology follow up  Hypertension Well controlled with current medications  Monitor blood pressure at home; patient to call if consistently greater than 130/80 Continue DASH diet.   Reminder to go to the ER if any CP, SOB, nausea, dizziness, severe HA, changes vision/speech, left arm numbness and tingling and jaw pain.  Cholesterol Currently mildly above LDL goal (<70); currently taking rosuvastatin 20 mg daily and tolerates well, consider 40 mg daily if remains elevated Continue low cholesterol diet and exercise.  Check lipid panel.   Other abnormal glucose Recent A1Cs at goal Discussed diet/exercise, weight management  Defer A1C; check CMP  CKD stage 4 Followed by Dr. Moshe Cipro Increase fluids, avoid NSAIDS, monitor sugars, will monitor Check CMP/GFR  Hypothyroidism continue medications the same pending lab results reminded to take on an empty stomach 30-43mins before food.  check TSH level  Overweight with co morbidities Long discussion about weight loss, diet, and exercise Recommended diet heavy in fruits and veggies and low in animal meats, cheeses, and dairy products, appropriate calorie intake Will follow up in 3 months  Vitamin D Def At goal at last visit; continue supplementation to maintain goal of 70-100 Defer Vit D level  Tobacco use Discussed risks associated with tobacco use and advised to reduce or quit Patient is ready to do so and plans to try nicotine gum and slow taper Declines Chantix or other medication, but will consider, discussed and printed script for her to hold and information about steps to quit, strategies and chantix provided Will follow up at the next visit  Seizures Last seizure remote; stable on keppra Check serum keppra levels   Continue diet and meds as discussed. Further disposition  pending results of labs. Discussed med's effects and SE's.   Over 30 minutes of exam, counseling, chart review, and critical decision making was performed.   Future Appointments  Date Time Provider [place]  02/03/2018  3:00 PM Liane Comber, NP [facility] None    ----------------------------------------------------------------------------------------------------------------------  HPI 57 y.o. female  presents for 3 month follow up on hypertension, cholesterol, glucose management, weight and vitamin D deficiency. She has ASCVD with bypass in 2002, followed annually by cardiology; hx of ruptured cerebral aneurysm in 2005 with repeated repair in 2012, hx of seizure order on keppra - no recent seizures - last in 2008-  Followed by [facility].   she currently continues to smoke 1 pack a day; discussed risks associated with smoking, patient is ready to quit with her husband, trying nicotine gum and slow taper, declines chantix today but will consider if needed.    BMI is Body mass index is 29.94 kg/m., she has been working on diet and exercise, has been watching portions, avoiding morning biscuit, breads, etc but was on vacation last week, just got back from [facility], [place]. She has pedal machine at home under work desk.  Wt Readings from Last 3 Encounters:  12/23/17 194 lb (88 kg)  10/18/17 191 lb (86.6 kg)  09/02/17 190 lb (86.2 kg)   Her blood pressure has been controlled at home, today their BP is BP: 110/64  She does workout occasionally - has seated cycling machine that she does while at her desk. She denies chest pain, shortness of breath, dizziness.    She is on cholesterol medication (crestor 20 mg daily) and denies myalgias. Her LDL cholesterol is at  goal.The cholesterol last visit was:   Lab Results  Component Value Date   CHOL 149 09/02/2017   HDL 35 (L) 09/02/2017   LDLCALC 88 09/02/2017   TRIG 155 (H) 09/02/2017   CHOLHDL 4.3 09/02/2017    She has been  working on diet and exercise for glucose management (intermittent hx of prediabetes), and denies foot ulcerations, increased appetite, nausea, paresthesia of the feet, polydipsia, polyuria, visual disturbances, vomiting and weight loss. Last A1C in the office was:  Lab Results  Component Value Date   HGBA1C 5.3 01/31/2017   She is on thyroid medication. Her medication was not changed last visit.   Lab Results  Component Value Date   TSH 1.02 09/02/2017    She is followed by Dr. Corliss Parish for CKD 3/4, just saw her a few weeks ago and she is pleased with current status. She has stopped taking goody powders and trying to quit smoking. She drinks 3 bottles of water daily.  Lab Results  Component Value Date   GFRNONAA 23 (L) 09/02/2017   Patient is on Vitamin D supplement and at goal at recent check:    Lab Results  Component Value Date   VD25OH 65 01/31/2017       Current Medications:  Current Outpatient Medications on File Prior to Visit  Medication Sig  . clopidogrel (PLAVIX) 75 MG tablet TAKE 1 TABLET BY MOUTH  DAILY  . furosemide (LASIX) 40 MG tablet TAKE 1 TABLET BY MOUTH 2  TIMES DAILY FOR BLOOD  PRESSURE AND FLUID  . labetalol (NORMODYNE) 300 MG tablet TAKE 1 TABLET BY MOUTH  TWICE A DAY  . levETIRAcetam (KEPPRA) 500 MG tablet TAKE 1 TABLET BY MOUTH  TWICE A DAY FOR SEIZURES  . losartan (COZAAR) 50 MG tablet Take 50 mg by mouth daily.  . promethazine-dextromethorphan (PROMETHAZINE-DM) 6.25-15 MG/5ML syrup Take 5 mLs by mouth 4 (four) times daily as needed for cough.  . rosuvastatin (CRESTOR) 20 MG tablet TAKE 1 TABLET BY MOUTH  DAILY FOR CHOLESTEROL  . SYNTHROID 75 MCG tablet TAKE 1 TABLET BY MOUTH  DAILY  . Vitamin D, Ergocalciferol, (DRISDOL) 50000 UNITS CAPS capsule TAKE 1 CAPSULE DAILY FOR SEVERE VITAMIN D DEFICIENCY SECONDARY TO MALABSORPTION (Patient taking differently: TAKE 1 CAPSULE EVERY OTHER DAY FOR SEVERE VITAMIN D DEFICIENCY SECONDARY TO MALABSORPTION)    No current facility-administered medications on file prior to visit.      Allergies:  Allergies  Allergen Reactions  . Ace Inhibitors Cough  . Penicillins   . Prednisone     Dysphoria     Medical History:  Past Medical History:  Diagnosis Date  . CAD (coronary artery disease)   . CKD (chronic kidney disease) stage 3, GFR 30-59 ml/min (HCC)   . History of basal cell carcinoma excision 12/20/2017  . Hyperlipidemia LDL goal <70   . Hypertension   . Hypothyroidism   . Tobacco abuse   . Vitamin D deficiency    Family history- Reviewed and unchanged Social history- Reviewed and unchanged   Review of Systems:  Review of Systems  Constitutional: Negative for malaise/fatigue and weight loss.  HENT: Negative for hearing loss and tinnitus.   Eyes: Negative for blurred vision and double vision.  Respiratory: Negative for cough, shortness of breath and wheezing.   Cardiovascular: Negative for chest pain, palpitations, orthopnea, claudication and leg swelling.  Gastrointestinal: Negative for abdominal pain, blood in stool, constipation, diarrhea, heartburn, melena, nausea and vomiting.  Genitourinary: Negative.   Musculoskeletal:  Negative for joint pain and myalgias.  Skin: Negative for rash.  Neurological: Negative for dizziness, tingling, sensory change, weakness and headaches.  Endo/Heme/Allergies: Negative for polydipsia.  Psychiatric/Behavioral: Negative.   All other systems reviewed and are negative.    Physical Exam: BP 110/64   Pulse 60   Temp (!) 97.5 F (36.4 C)   Ht 5' 7.5" (1.715 m)   Wt 194 lb (88 kg)   SpO2 99%   BMI 29.94 kg/m  Wt Readings from Last 3 Encounters:  12/23/17 194 lb (88 kg)  10/18/17 191 lb (86.6 kg)  09/02/17 190 lb (86.2 kg)   General Appearance: Well nourished, in no apparent distress. Eyes: PERRLA, EOMs, conjunctiva no swelling or erythema Sinuses: No Frontal/maxillary tenderness ENT/Mouth: Ext aud canals clear, TMs without  erythema, bulging. No erythema, swelling, or exudate on post pharynx.  Tonsils not swollen or erythematous. Hearing normal.  Neck: Supple, thyroid normal.  Respiratory: Respiratory effort normal, BS equal bilaterally without rales, rhonchi, wheezing or stridor.  Cardio: RRR with 3/6 decreshendo systolic murmur. Brisk peripheral pulses with nonpitting edema to bilateral ankles/lower legs, chronic and unchanged.  Abdomen: Soft, + BS.  Non tender, no guarding, rebound, hernias, masses. Lymphatics: Non tender without lymphadenopathy.  Musculoskeletal: Full ROM, 5/5 strength, Normal gait Skin: Warm, dry; multiple pearly growths to face and neck without erythema or ulceration. Open wound to R medial lower leg (recent Moh's at derm per patient).  Neuro: Cranial nerves intact. No cerebellar symptoms. Sensation intact.  Psych: Awake and oriented X 3, normal affect, Insight and Judgment appropriate.    Izora Ribas, NP 4:18 PM [facility]

## 2017-12-23 ENCOUNTER — Ambulatory Visit (INDEPENDENT_AMBULATORY_CARE_PROVIDER_SITE_OTHER): Payer: 59 | Admitting: Adult Health

## 2017-12-23 ENCOUNTER — Encounter: Payer: Self-pay | Admitting: Adult Health

## 2017-12-23 VITALS — BP 110/64 | HR 60 | Temp 97.5°F | Ht 67.5 in | Wt 194.0 lb

## 2017-12-23 DIAGNOSIS — I2510 Atherosclerotic heart disease of native coronary artery without angina pectoris: Secondary | ICD-10-CM

## 2017-12-23 DIAGNOSIS — Z23 Encounter for immunization: Secondary | ICD-10-CM | POA: Diagnosis not present

## 2017-12-23 DIAGNOSIS — E559 Vitamin D deficiency, unspecified: Secondary | ICD-10-CM

## 2017-12-23 DIAGNOSIS — E663 Overweight: Secondary | ICD-10-CM

## 2017-12-23 DIAGNOSIS — E039 Hypothyroidism, unspecified: Secondary | ICD-10-CM | POA: Diagnosis not present

## 2017-12-23 DIAGNOSIS — N184 Chronic kidney disease, stage 4 (severe): Secondary | ICD-10-CM | POA: Diagnosis not present

## 2017-12-23 DIAGNOSIS — E782 Mixed hyperlipidemia: Secondary | ICD-10-CM | POA: Diagnosis not present

## 2017-12-23 DIAGNOSIS — G40909 Epilepsy, unspecified, not intractable, without status epilepticus: Secondary | ICD-10-CM | POA: Diagnosis not present

## 2017-12-23 DIAGNOSIS — F17200 Nicotine dependence, unspecified, uncomplicated: Secondary | ICD-10-CM

## 2017-12-23 DIAGNOSIS — Z85828 Personal history of other malignant neoplasm of skin: Secondary | ICD-10-CM

## 2017-12-23 DIAGNOSIS — R7303 Prediabetes: Secondary | ICD-10-CM

## 2017-12-23 DIAGNOSIS — Z79899 Other long term (current) drug therapy: Secondary | ICD-10-CM

## 2017-12-23 DIAGNOSIS — Z9889 Other specified postprocedural states: Secondary | ICD-10-CM

## 2017-12-23 DIAGNOSIS — D649 Anemia, unspecified: Secondary | ICD-10-CM

## 2017-12-23 MED ORDER — VARENICLINE TARTRATE 1 MG PO TABS: 1.0000 mg | ORAL_TABLET | Freq: Two times a day (BID) | ORAL | 2 refills | Status: DC

## 2017-12-23 NOTE — Patient Instructions (Signed)
Goals    . DIET - INCREASE WATER INTAKE     Aim for 4-5 bottles daily     . LDL CALC < 70    . Quit Smoking    . Weight (lb) < 185 lb (83.9 kg)       [place]  American [employer] society  [phone] for more information or for a free program for smoking cessation help.   You can call QUIT SMART 1-800-QUIT-NOW for free nicotine patches or replacement therapy- if they are out- keep calling   cancer center Can call for smoking cessation classes, [phone]  If you have a smart phone, please look up Smoke Free app, this will help you stay on track and give you information about money you have saved, life that you have gained back and a ton of more information.   We are giving you chantix for smoking cessation. You can do it! And we are here to help! You may have heard some scary side effects about chantix, the three most common I hear about are nausea, crazy dreams and depression.  However, I like for my patients to try to stay on 1/2 a tablet twice a day rather than one tablet twice a day as normally prescribed. This helps decrease the chances of side effects and helps save money by making a one month prescription last two months  Please start the prescription this way:  Start 1/2 tablet by mouth once daily after food with a full glass of water for 3 days Then do 1/2 tablet by mouth twice daily for 4 days. During this first week you can smoke, but try to stop after this week.  At this point we have several options: 1) continue on 1/2 tablet twice a day- which I encourage you to do. You can stay on this dose the rest of the time on the medication or if you still feel the need to smoke you can do one of the two options below. 2) do one tablet in the morning and 1/2 in the evening which helps decrease dreams. 3) do one tablet twice a day.   What if I miss a dose? If you miss a dose, take it as soon as you can. If it is almost time for your next dose,  take only that dose. Do not take double or extra doses.  What should I watch for while using this medicine? Visit your doctor or health care professional for regular check ups. Ask for ongoing advice and encouragement from your doctor or healthcare professional, friends, and family to help you quit. If you smoke while on this medication, quit again  Your mouth may get dry. Chewing sugarless gum or hard candy, and drinking plenty of water may help. Contact your doctor if the problem does not go away or is severe.  You may get drowsy or dizzy. Do not drive, use machinery, or do anything that needs mental alertness until you know how this medicine affects you. Do not stand or sit up quickly, especially if you are an older patient.   The use of this medicine may increase the chance of suicidal thoughts or actions. Pay special attention to how you are responding while on this medicine. Any worsening of mood, or thoughts of suicide or dying should be reported to your health care professional right away.  ADVANTAGES OF QUITTING SMOKING  Within 20 minutes, blood pressure decreases. Your pulse is at normal level.  After 8 hours,  carbon monoxide levels in the blood return to normal. Your oxygen level increases.  After 24 hours, the chance of having a heart attack starts to decrease. Your breath, hair, and body stop smelling like smoke.  After 48 hours, damaged nerve endings begin to recover. Your sense of taste and smell improve.  After 72 hours, the body is virtually free of nicotine. Your bronchial tubes relax and breathing becomes easier.  After 2 to 12 weeks, lungs can hold more air. Exercise becomes easier and circulation improves.  After 1 year, the risk of coronary heart disease is cut in half.  After 5 years, the risk of stroke falls to the same as a nonsmoker.  After 10 years, the risk of lung cancer is cut in half and the risk of other cancers decreases significantly.  After 15 years,  the risk of coronary heart disease drops, usually to the level of a nonsmoker.  You will have extra money to spend on things other than cigarettes.     Steps to Quit Smoking Smoking tobacco can be harmful to your health and can affect almost every organ in your body. Smoking puts you, and those around you, at risk for developing many serious chronic diseases. Quitting smoking is difficult, but it is one of the best things that you can do for your health. It is never too late to quit. What are the benefits of quitting smoking? When you quit smoking, you lower your risk of developing serious diseases and conditions, such as:  Lung cancer or lung disease, such as COPD.  Heart disease.  Stroke.  Heart attack.  Infertility.  Osteoporosis and bone fractures.  Additionally, symptoms such as coughing, wheezing, and shortness of breath may get better when you quit. You may also find that you get sick less often because your body is stronger at fighting off colds and infections. If you are pregnant, quitting smoking can help to reduce your chances of having a baby of low birth weight. How do I get ready to quit? When you decide to quit smoking, create a plan to make sure that you are successful. Before you quit:  Pick a date to quit. Set a date within the next two weeks to give you time to prepare.  Write down the reasons why you are quitting. Keep this list in places where you will see it often, such as on your bathroom mirror or in your car or wallet.  Identify the people, places, things, and activities that make you want to smoke (triggers) and avoid them. Make sure to take these actions: ? Throw away all cigarettes at home, at work, and in your car. ? Throw away smoking accessories, such as Scientist, research (medical). ? Clean your car and make sure to empty the ashtray. ? Clean your home, including curtains and carpets.  Tell your family, friends, and coworkers that you are quitting.  Support from your loved ones can make quitting easier.  Talk with your health care provider about your options for quitting smoking.  Find out what treatment options are covered by your health insurance.  What strategies can I use to quit smoking? Talk with your healthcare provider about different strategies to quit smoking. Some strategies include:  Quitting smoking altogether instead of gradually lessening how much you smoke over a period of time. Research shows that quitting "cold [place]" is more successful than gradually quitting.  Attending in-person counseling to help you build problem-solving skills. You are more likely to have  success in quitting if you attend several counseling sessions. Even short sessions of 10 minutes can be effective.  Finding resources and support systems that can help you to quit smoking and remain smoke-free after you quit. These resources are most helpful when you use them often. They can include: ? Online chats with a Social worker. ? Telephone quitlines. ? Careers information officer. ? Support groups or group counseling. ? Text messaging programs. ? Mobile phone applications.  Taking medicines to help you quit smoking. (If you are pregnant or breastfeeding, talk with your health care provider first.) Some medicines contain nicotine and some do not. Both types of medicines help with cravings, but the medicines that include nicotine help to relieve withdrawal symptoms. Your health care provider may recommend: ? Nicotine patches, gum, or lozenges. ? Nicotine inhalers or sprays. ? Non-nicotine medicine that is taken by mouth.  Talk with your health care provider about combining strategies, such as taking medicines while you are also receiving in-person counseling. Using these two strategies together makes you more likely to succeed in quitting than if you used either strategy on its own. If you are pregnant or breastfeeding, talk with your health care provider  about finding counseling or other support strategies to quit smoking. Do not take medicine to help you quit smoking unless told to do so by your health care provider. What things can I do to make it easier to quit? Quitting smoking might feel overwhelming at first, but there is a lot that you can do to make it easier. Take these important actions:  Reach out to your family and friends and ask that they support and encourage you during this time. Call telephone quitlines, reach out to support groups, or work with a counselor for support.  Ask people who smoke to avoid smoking around you.  Avoid places that trigger you to smoke, such as bars, parties, or smoke-break areas at work.  Spend time around people who do not smoke.  Lessen stress in your life, because stress can be a smoking trigger for some people. To lessen stress, try: ? Exercising regularly. ? Deep-breathing exercises. ? Yoga. ? Meditating. ? Performing a body scan. This involves closing your eyes, scanning your body from head to toe, and noticing which parts of your body are particularly tense. Purposefully relax the muscles in those areas.  Download or purchase mobile phone or tablet apps (applications) that can help you stick to your quit plan by providing reminders, tips, and encouragement. There are many free apps, such as QuitGuide from the [employer] Office manager for Disease Control and Prevention). You can find other support for quitting smoking (smoking cessation) through smokefree.gov and other websites.  How will I feel when I quit smoking? Within the first 24 hours of quitting smoking, you may start to feel some withdrawal symptoms. These symptoms are usually most noticeable 2-3 days after quitting, but they usually do not last beyond 2-3 weeks. Changes or symptoms that you might experience include:  Mood swings.  Restlessness, anxiety, or irritation.  Difficulty concentrating.  Dizziness.  Strong cravings for sugary foods  in addition to nicotine.  Mild weight gain.  Constipation.  Nausea.  Coughing or a sore throat.  Changes in how your medicines work in your body.  A depressed mood.  Difficulty sleeping (insomnia).  After the first 2-3 weeks of quitting, you may start to notice more positive results, such as:  Improved sense of smell and taste.  Decreased coughing and sore throat.  Slower heart rate.  Lower blood pressure.  Clearer skin.  The ability to breathe more easily.  Fewer sick days.  Quitting smoking is very challenging for most people. Do not get discouraged if you are not successful the first time. Some people need to make many attempts to quit before they achieve long-term success. Do your best to stick to your quit plan, and talk with your health care provider if you have any questions or concerns. This information is not intended to replace advice given to you by your health care provider. Make sure you discuss any questions you have with your health care provider. Document Released: 02/06/2001 Document Revised: 10/11/2015 Document Reviewed: 06/29/2014 Elsevier Interactive Patient Education  [employer].

## 2017-12-25 LAB — COMPLETE METABOLIC PANEL WITH GFR
AG RATIO: 2 (calc) (ref 1.0–2.5)
ALT: 7 U/L (ref 6–29)
AST: 10 U/L (ref 10–35)
Albumin: 4.2 g/dL (ref 3.6–5.1)
Alkaline phosphatase (APISO): 50 U/L (ref 33–130)
BILIRUBIN TOTAL: 0.3 mg/dL (ref 0.2–1.2)
BUN/Creatinine Ratio: 16 (calc) (ref 6–22)
BUN: 35 mg/dL — ABNORMAL HIGH (ref 7–25)
CHLORIDE: 101 mmol/L (ref 98–110)
CO2: 29 mmol/L (ref 20–32)
Calcium: 9.5 mg/dL (ref 8.6–10.4)
Creat: 2.16 mg/dL — ABNORMAL HIGH (ref 0.50–1.05)
GFR, EST AFRICAN AMERICAN: 28 mL/min/{1.73_m2} — AB (ref >=60)
GFR, EST NON AFRICAN AMERICAN: 24 mL/min/{1.73_m2} — AB (ref >=60)
GLOBULIN: 2.1 g/dL (ref 1.9–3.7)
Glucose, Bld: 96 mg/dL (ref 65–99)
POTASSIUM: 4.6 mmol/L (ref 3.5–5.3)
SODIUM: 137 mmol/L (ref 135–146)
TOTAL PROTEIN: 6.3 g/dL (ref 6.1–8.1)

## 2017-12-25 LAB — CBC WITH DIFFERENTIAL/PLATELET
BASOS ABS: 29 {cells}/uL (ref 0–200)
BASOS PCT: 0.5 %
EOS ABS: 273 {cells}/uL (ref 15–500)
Eosinophils Relative: 4.7 %
HCT: 31.3 % — ABNORMAL LOW (ref 35.0–45.0)
HEMOGLOBIN: 10.5 g/dL — AB (ref 11.7–15.5)
Lymphs Abs: 1624 cells/uL (ref 850–3900)
MCH: 31.4 pg (ref 27.0–33.0)
MCHC: 33.5 g/dL (ref 32.0–36.0)
MCV: 93.7 fL (ref 80.0–100.0)
MONOS PCT: 10.8 %
MPV: 10.3 fL (ref 7.5–12.5)
NEUTROS ABS: 3248 {cells}/uL (ref 1500–7800)
Neutrophils Relative %: 56 %
Platelets: 207 10*3/uL (ref 140–400)
RBC: 3.34 10*6/uL — ABNORMAL LOW (ref 3.80–5.10)
RDW: 12.6 % (ref 11.0–15.0)
Total Lymphocyte: 28 %
WBC: 5.8 10*3/uL (ref 3.8–10.8)
WBCMIX: 626 {cells}/uL (ref 200–950)

## 2017-12-25 LAB — LIPID PANEL
CHOL/HDL RATIO: 4.1 (calc) (ref <5.0)
Cholesterol: 156 mg/dL (ref <200)
HDL: 38 mg/dL — ABNORMAL LOW (ref >50)
LDL Cholesterol (Calc): 97 mg/dL (calc)
NON-HDL CHOLESTEROL (CALC): 118 mg/dL (ref <130)
Triglycerides: 111 mg/dL (ref <150)

## 2017-12-25 LAB — MAGNESIUM: Magnesium: 1.9 mg/dL (ref 1.5–2.5)

## 2017-12-25 LAB — TSH: TSH: 1.24 mIU/L (ref 0.40–4.50)

## 2017-12-25 LAB — LEVETIRACETAM LEVEL: KEPPRA (LEVETIRACETAM): 33.8 ug/mL (ref 12.0–46.0)

## 2017-12-31 ENCOUNTER — Other Ambulatory Visit: Payer: Self-pay

## 2017-12-31 DIAGNOSIS — E785 Hyperlipidemia, unspecified: Secondary | ICD-10-CM

## 2017-12-31 MED ORDER — ROSUVASTATIN CALCIUM 20 MG PO TABS: ORAL_TABLET | ORAL | 0 refills | Status: DC

## 2017-12-31 NOTE — Telephone Encounter (Signed)
Refill request for Rosuvastatin

## 2018-01-01 ENCOUNTER — Other Ambulatory Visit: Payer: Self-pay | Admitting: Internal Medicine

## 2018-01-01 DIAGNOSIS — E039 Hypothyroidism, unspecified: Secondary | ICD-10-CM

## 2018-01-02 ENCOUNTER — Encounter: Payer: Self-pay | Admitting: Physician Assistant

## 2018-01-16 DIAGNOSIS — C44719 Basal cell carcinoma of skin of left lower limb, including hip: Secondary | ICD-10-CM | POA: Diagnosis not present

## 2018-01-30 ENCOUNTER — Other Ambulatory Visit: Payer: Self-pay

## 2018-01-30 DIAGNOSIS — E785 Hyperlipidemia, unspecified: Secondary | ICD-10-CM

## 2018-01-30 DIAGNOSIS — I10 Essential (primary) hypertension: Secondary | ICD-10-CM

## 2018-01-30 DIAGNOSIS — E039 Hypothyroidism, unspecified: Secondary | ICD-10-CM

## 2018-01-30 MED ORDER — ROSUVASTATIN CALCIUM 20 MG PO TABS: ORAL_TABLET | ORAL | 0 refills | Status: DC

## 2018-01-30 MED ORDER — LABETALOL HCL 300 MG PO TABS: 300.0000 mg | ORAL_TABLET | Freq: Two times a day (BID) | ORAL | 0 refills | Status: DC

## 2018-01-30 MED ORDER — LEVOTHYROXINE SODIUM 75 MCG PO TABS: 75.0000 ug | ORAL_TABLET | Freq: Every day | ORAL | 0 refills | Status: DC

## 2018-02-03 ENCOUNTER — Other Ambulatory Visit: Payer: Self-pay

## 2018-02-03 ENCOUNTER — Encounter: Payer: Self-pay | Admitting: Adult Health

## 2018-02-03 DIAGNOSIS — E039 Hypothyroidism, unspecified: Secondary | ICD-10-CM

## 2018-02-03 DIAGNOSIS — I10 Essential (primary) hypertension: Secondary | ICD-10-CM

## 2018-02-03 DIAGNOSIS — E785 Hyperlipidemia, unspecified: Secondary | ICD-10-CM

## 2018-02-03 MED ORDER — LEVOTHYROXINE SODIUM 75 MCG PO TABS: 75.0000 ug | ORAL_TABLET | Freq: Every day | ORAL | 0 refills | Status: DC

## 2018-02-03 MED ORDER — LABETALOL HCL 300 MG PO TABS: 300.0000 mg | ORAL_TABLET | Freq: Two times a day (BID) | ORAL | 0 refills | Status: DC

## 2018-02-03 MED ORDER — ROSUVASTATIN CALCIUM 20 MG PO TABS: ORAL_TABLET | ORAL | 0 refills | Status: DC

## 2018-02-03 NOTE — Telephone Encounter (Signed)
Request for 90 day supply be sent to mail order pharmacy for Synthroid, Rosuvastatin and labetalol.

## 2018-02-09 ENCOUNTER — Other Ambulatory Visit: Payer: Self-pay | Admitting: Adult Health

## 2018-02-09 DIAGNOSIS — E039 Hypothyroidism, unspecified: Secondary | ICD-10-CM

## 2018-02-09 DIAGNOSIS — I10 Essential (primary) hypertension: Secondary | ICD-10-CM

## 2018-02-20 ENCOUNTER — Telehealth: Payer: Self-pay | Admitting: Physician Assistant

## 2018-02-20 DIAGNOSIS — E039 Hypothyroidism, unspecified: Secondary | ICD-10-CM

## 2018-02-20 MED ORDER — LEVOTHYROXINE SODIUM 75 MCG PO TABS: ORAL_TABLET | ORAL | 0 refills | Status: DC

## 2018-02-20 NOTE — Telephone Encounter (Signed)
Pt has been informed of Rx that was sent. Dec 26th 2019

## 2018-02-20 NOTE — Telephone Encounter (Signed)
Spoke with Dr. Melford Aase, he is fine with the generic and is okay for you to try that. Will send in.

## 2018-02-26 HISTORY — PX: CATARACT EXTRACTION, BILATERAL: SHX1313

## 2018-02-28 ENCOUNTER — Other Ambulatory Visit: Payer: Self-pay | Admitting: Adult Health

## 2018-02-28 DIAGNOSIS — E785 Hyperlipidemia, unspecified: Secondary | ICD-10-CM

## 2018-02-28 DIAGNOSIS — E039 Hypothyroidism, unspecified: Secondary | ICD-10-CM

## 2018-03-24 NOTE — Progress Notes (Deleted)
Complete Physical  Assessment and Plan:  Diagnoses and all orders for this visit:  Essential hypertension Continue medication Monitor blood pressure at home; call if consistently over 130/80 Continue to recommend DASH diet.   Reminder to go to the ER if any CP, SOB, nausea, dizziness, severe HA, changes vision/speech, left arm numbness and tingling and jaw pain.  Atherosclerosis of native coronary artery of native heart without angina pectoris Followed by cardiology Quit smoking - patient unwilling Continue plavix  Cerebral artery occlusion with cerebral infarction ([place]) Recommended she quit smoking, continue plavix - followed by [facility]  Peripheral vascular disease ([place]) Continue aggressive treatment of cholesterol Continue plavix Continue cardiology follow up  Hypothyroidism, unspecified type continue medications the same reminded to take on an empty stomach 30-68mins before food.  -     TSH  Seizure disorder ([place]) Continue keppra; recommended she quit smoking but unwilling at this time. Monitor levels as needed.  Stage 4 CKD  Followed by nephrology - [facility] -     CMP/GFR -     Urinalysis, Complete (81001) -     Microalbumin / creatinine urine ratio  Hyperlipidemia Continue medications Continue to recommend low cholesterol diet and exercise.  -     Lipid panel  SMOKER Continues to smoke 1 ppd-  instruction/counseling given, counseled patient on the dangers of tobacco use, advised patient to stop smoking, and reviewed strategies to maximize success, patient not ready to quit at this time.  ***  Vitamin D deficiency Continue supplementation -     VITAMIN D 25 Hydroxy (Vit-D Deficiency, Fractures)  Prediabetes Discussed disease and risks Discussed diet/exercise, weight management  -     Hemoglobin A1c  Medication management -     CBC with Differential/Platelet -     CMP/GFR -     Magnesium   Overweight Long discussion about  weight loss, diet, and exercise Recommended diet heavy in fruits and veggies and low in animal meats, cheeses, and dairy products, appropriate calorie intake Discussed appropriate weight for height  Follow up at next visit  Anemia ? Anemia r/t chronic CKD; check CBC, iron, B12 -     Vitamin B12 -     Iron,Total/Total Iron Binding Cap  Screening for colorectal cancer *** Patient agrees to cologuard; she will call insurance to verify coverage; we will follow up via phone call - if not covered will refer for colonoscopy    Discussed med's effects and SE's. Screening labs and tests as requested with regular follow-up as recommended. Over 40 minutes of exam, counseling, chart review, and complex, high level critical decision making was performed this visit.   Future Appointments  Date Time Provider [place]  03/26/2018  3:00 PM Liane Comber, NP [facility] None  03/30/2019  3:00 PM Liane Comber, NP [facility] None     HPI  58 y.o. Caucasian married female, works from home for [employer] - presents for a complete physical and follow up for has Hypothyroidism; Hyperlipidemia; SMOKER; Essential hypertension; CAD (coronary artery disease); History of CVA (cerebrovascular accident); Peripheral vascular disease ([place]); CKD (chronic kidney disease) stage 4, GFR 15-29 ml/min ([place]); Seizure disorder ([place]); Vitamin D deficiency; Prediabetes; Poor compliance; Medication management; Overweight (BMI 25.0-29.9); and Anemia on their problem list..   She has ASCVD with bypass in 2002, followed annually by cardiology; hx of ruptured cerebral aneurysm in 2005 with repeated repair in 2012, hx of seizure order on keppra - no recent seizures - last in 2008-  Followed by [facility].   She is newly followed by Nephrology Dr. Corliss Parish since last year due to CKD IV  she currently continues to smoke 1 pack a day; discussed risks associated with smoking at length, patient is  not ready to quit. She states she is very aware of risks and has quit in the past, but is not at a point where she is ready to do so. She is aware that she needs to make this decision for her health.  [place]  She has a history of smoking, between the age of [age], has quit within the last 26 years or is current smoker. She has greater than a 30+ pack/smoking history and has no symptoms at this time. She qualify for low dose CT scan for lung cancer screening. A lung cancer screening counseling and shared decision was made during this visit, patient agrees with low dose CT scan. ***   BMI is There is no height or weight on file to calculate BMI., she has been working on diet and exercise. She has a pedal exerciser at her desk and uses daily - makes small healthy choices.  Wt Readings from Last 3 Encounters:  12/23/17 194 lb (88 kg)  10/18/17 191 lb (86.6 kg)  09/02/17 190 lb (86.2 kg)   Her blood pressure has been controlled at home, today their BP is   She does workout- cycler at her desk.   She denies chest pain, shortness of breath, dizziness.   She is on cholesterol medication (rosuvastatin 20 mg daily) and denies myalgias. Her cholesterol is at goal. The cholesterol last visit was:   Lab Results  Component Value Date   CHOL 156 12/23/2017   HDL 38 (L) 12/23/2017   LDLCALC 97 12/23/2017   TRIG 111 12/23/2017   CHOLHDL 4.1 12/23/2017   She has been working on diet and exercise for glucose management, she is not on bASA (on plavix), she is on ACE/ARB and denies increased appetite, nausea, paresthesia of the feet, polydipsia, polyuria, visual disturbances and vomiting. Last A1C in the office was:  Lab Results  Component Value Date   HGBA1C 5.3 01/31/2017   She has CKD IV and is following with Dr. Corliss Parish since last year:  Lab Results  Component Value Date   GFRNONAA 24 (L) 12/23/2017   She is on thyroid medication. Her medication was not changed last visit.   Lab Results   Component Value Date   TSH 1.24 12/23/2017   Patient is on Vitamin D supplement.   Lab Results  Component Value Date   VD25OH 50 01/31/2017      Current Medications:  Current Outpatient Medications on File Prior to Visit  Medication Sig Dispense Refill  . clopidogrel (PLAVIX) 75 MG tablet TAKE 1 TABLET BY MOUTH  DAILY 90 tablet 0  . furosemide (LASIX) 40 MG tablet TAKE 1 TABLET BY MOUTH 2  TIMES DAILY FOR BLOOD  PRESSURE AND FLUID 180 tablet 0  . labetalol (NORMODYNE) 300 MG tablet Take 1 tablet 2 x /day for BP 180 tablet 0  . levETIRAcetam (KEPPRA) 500 MG tablet TAKE 1 TABLET BY MOUTH  TWICE A DAY FOR SEIZURES 180 tablet 0  . levothyroxine (SYNTHROID, LEVOTHROID) 75 MCG tablet TAKE ONE TABLET BY MOUTH DAILY 90 tablet 1  . losartan (COZAAR) 50 MG tablet Take 50 mg by mouth daily.    . promethazine-dextromethorphan (PROMETHAZINE-DM) 6.25-15 MG/5ML syrup Take 5 mLs by mouth 4 (four) times daily as  needed for cough. 240 mL 1  . rosuvastatin (CRESTOR) 20 MG tablet TAKE ONE TABLET BY MOUTH DAILY FOR CHOLESTEROL. 30 tablet 0  . varenicline (CHANTIX CONTINUING MONTH PAK) 1 MG tablet Take 1 tablet (1 mg total) by mouth 2 (two) times daily. 30 tablet 2  . Vitamin D, Ergocalciferol, (DRISDOL) 50000 UNITS CAPS capsule TAKE 1 CAPSULE DAILY FOR SEVERE VITAMIN D DEFICIENCY SECONDARY TO MALABSORPTION (Patient taking differently: TAKE 1 CAPSULE EVERY OTHER DAY FOR SEVERE VITAMIN D DEFICIENCY SECONDARY TO MALABSORPTION) 90 capsule 1   No current facility-administered medications on file prior to visit.    Allergies:  Allergies  Allergen Reactions  . Ace Inhibitors Cough  . Penicillins   . Prednisone     Dysphoria   Medical History:  She has Hypothyroidism; Hyperlipidemia; SMOKER; Essential hypertension; CAD (coronary artery disease); History of CVA (cerebrovascular accident); Peripheral vascular disease ([place]); CKD (chronic kidney disease) stage 4, GFR 15-29 ml/min ([place]); Seizure disorder ([place]);  Vitamin D deficiency; Prediabetes; Poor compliance; Medication management; Overweight (BMI 25.0-29.9); and Anemia on their problem list. Health Maintenance:   Immunization History  Administered Date(s) Administered  . Influenza Inj Mdck Quad With Preservative 01/31/2017, 12/23/2017  . Influenza Split 12/02/2014  . Influenza,inj,quad, With Preservative 12/20/2015  . PPD Test 10/09/2013, 12/02/2014  . Td 10/24/2006, 01/31/2017   Tetanus: 2018 Flu vaccine: 2019 Pap: Remote - DECLINES - discussed risks, if patient changes her mind she may request at regular visit MGM: 05/2017 DEXA: Not indicated Colonoscopy: DUE *** - agrees to cologuard - we will call to verify until done CXR: 2015 *** smoker   Last Dental Exam: Remote - needs top teeth out - she is planning to see an oral surgeon Last Eye Exam: Annually - scheduled for next week   Patient Care Team: [name], MD as PCP - General (Internal Medicine) Nahser, Wonda Cheng, MD as PCP - Cardiology (Cardiology) Corliss Parish, MD as Consulting Physician (Nephrology)  Surgical History:  She has a past surgical history that includes Coronary artery bypass graft (2002); Embolization (2005); Cerebral aneurysm repair (Right, 2012); and Cerebral aneurysm repair (Right, 2005). Family History:  Herfamily history includes Cancer in her father; Heart attack in her brother; Heart disease in her brother and mother; Leukemia in her sister. Social History:  She reports that she has been smoking cigarettes. She has a 40.00 pack-year smoking history. She has never used smokeless tobacco. She reports that she does not drink alcohol or use drugs.  Review of Systems: Review of Systems  Constitutional: Negative for malaise/fatigue and weight loss.  HENT: Negative for hearing loss and tinnitus.   Eyes: Negative for blurred vision and double vision.  Respiratory: Negative for cough, shortness of breath and wheezing.   Cardiovascular: Negative for  chest pain, palpitations, orthopnea, claudication and leg swelling.  Gastrointestinal: Negative for abdominal pain, blood in stool, constipation, diarrhea, heartburn, melena, nausea and vomiting.  Genitourinary: Negative.   Musculoskeletal: Negative for joint pain and myalgias.  Skin: Negative for rash.  Neurological: Negative for dizziness, tingling, sensory change, weakness and headaches.  Endo/Heme/Allergies: Positive for environmental allergies (She reports mild symptoms - uses OTC medications rarely as needed). Negative for polydipsia.  Psychiatric/Behavioral: Negative.   All other systems reviewed and are negative.   Physical Exam: Estimated body mass index is 29.94 kg/m as calculated from the following:   Height as of 12/23/17: 5' 7.5" (1.715 m).   Weight as of 12/23/17: 194 lb (88 kg). There were no vitals taken for  this visit. General Appearance: Well nourished, in no apparent distress.  Eyes: PERRLA, EOMs, conjunctiva no swelling or erythema.  Sinuses: No Frontal/maxillary tenderness  ENT/Mouth: Ext aud canals clear, normal light reflex with TMs without erythema, bulging. Extremely poor dentition - broken/missing/black teeth. No erythema, swelling, or exudate on post pharynx. Tonsils not swollen or erythematous. Hearing normal.  Neck: Supple, thyroid normal. No bruits  Respiratory: Respiratory effort normal, BS present but somewhat coarse throughout; no wheezing, stridor.  Cardio: RRR without murmurs, rubs or gallops. 1+ symmetrical peripheral pulses without edema.  Chest: symmetric, with normal excursions and percussion.  Breasts: Symmetric, without lumps, nipple discharge, retractions.  Abdomen: Soft, nontender, no guarding, rebound, hernias, masses, or organomegaly.  Lymphatics: Non tender without lymphadenopathy.  Genitourinary: Patient declines Musculoskeletal: Full ROM all peripheral extremities,5/5 strength, and normal gait.  Skin: Warm, dry without rashes, lesions,  ecchymosis. Neuro: Cranial nerves intact, reflexes equal bilaterally. Normal muscle tone, no cerebellar symptoms. Sensation intact.  Psych: Awake and oriented X 3, normal affect, Insight and Judgment appropriate.   EKG: Had recently by cardiology -defer   Izora Ribas 1:31 PM [facility]

## 2018-03-26 ENCOUNTER — Encounter: Payer: Self-pay | Admitting: Adult Health

## 2018-03-26 ENCOUNTER — Other Ambulatory Visit: Payer: Self-pay | Admitting: Internal Medicine

## 2018-03-26 MED ORDER — CYCLOBENZAPRINE HCL 10 MG PO TABS: ORAL_TABLET | ORAL | 0 refills | Status: DC

## 2018-03-31 DIAGNOSIS — H2511 Age-related nuclear cataract, right eye: Secondary | ICD-10-CM | POA: Diagnosis not present

## 2018-03-31 DIAGNOSIS — H2513 Age-related nuclear cataract, bilateral: Secondary | ICD-10-CM | POA: Diagnosis not present

## 2018-03-31 NOTE — Progress Notes (Signed)
Complete Physical  Assessment and Plan:  Diagnoses and all orders for this visit:  Essential hypertension Continue medication Monitor blood pressure at home; call if consistently over 130/80 Continue to recommend DASH diet.   Reminder to go to the ER if any CP, SOB, nausea, dizziness, severe HA, changes vision/speech, left arm numbness and tingling and jaw pain.  Atherosclerosis of native coronary artery of native heart without angina pectoris Followed by cardiology Quit smoking - patient unwilling Continue plavix  Cerebral artery occlusion with cerebral infarction ([place]) Recommended she quit smoking, continue plavix - followed by [facility]  Peripheral vascular disease ([place]) Continue aggressive treatment of cholesterol Continue plavix Continue cardiology follow up  Hypothyroidism, unspecified type continue medications the same reminded to take on an empty stomach 30-15mins before food.  -     TSH  Seizure disorder ([place]) Continue keppra; recommended she quit smoking but unwilling at this time. Monitor levels as needed.  Stage 4 CKD  Followed by nephrology - [facility] -     CMP/GFR -     Urinalysis, Complete (81001) -     Microalbumin / creatinine urine ratio  Hyperlipidemia Continue medications Continue to recommend low cholesterol diet and exercise.  -     Lipid panel  SMOKER Continues to smoke 1 ppd-  instruction/counseling given, counseled patient on the dangers of tobacco use, advised patient to stop smoking, and reviewed strategies to maximize success, patient is on chantix and actively cutting back  - Discussed low dose screening CT and will proceed with patient's permission  Vitamin D deficiency Continue supplementation -     VITAMIN D 25 Hydroxy (Vit-D Deficiency, Fractures)  Prediabetes Discussed disease and risks Discussed diet/exercise, weight management  -     Hemoglobin A1c  Medication management -     CBC with  Differential/Platelet -     CMP/GFR -     Magnesium   Overweight Long discussion about weight loss, diet, and exercise Recommended diet heavy in fruits and veggies and low in animal meats, cheeses, and dairy products, appropriate calorie intake Discussed appropriate weight for height  Follow up at next visit  Anemia ? Anemia r/t chronic CKD; check CBC, iron, B12 -     Vitamin B12 -     Iron,Total/Total Iron Binding Cap  Screening for colorectal cancer Colonoscopy- patient declines a colonoscopy even though the risks and benefits were discussed at length. Colon cancer is 3rd most diagnosed cancer and 2nd leading cause of death in both men and women 59 years of age and older. Patient understands the risk of cancer and death with declining the test however they are willing to do cologuard screening instead. They understand that this is not as sensitive or specific as a colonoscopy and they are still recommended to get a colonoscopy. She has cologuard test from last year, encouraged to place in bathroom, will call frequently to follow up  Diminished pulses/LLE claudication - ABI ordered Control blood pressure, cholesterol, glucose, increase exercise, on plavix    Discussed med's effects and SE's. Screening labs and tests as requested with regular follow-up as recommended. Over 40 minutes of exam, counseling, chart review, and complex, high level critical decision making was performed this visit.   No future appointments.   HPI  58 y.o. Caucasian married female, works from home for [employer] - presents for a complete physical and follow up for has Hypothyroidism; Hyperlipidemia; SMOKER; Essential hypertension; CAD (coronary artery disease); History of CVA (cerebrovascular accident);  Peripheral vascular disease ([place]); CKD (chronic kidney disease) stage 4, GFR 15-29 ml/min ([place]); Seizure disorder ([place]); Vitamin D deficiency; Prediabetes; Poor compliance; Medication management;  Overweight (BMI 25.0-29.9); and Anemia on their problem list..   She has ASCVD with bypass in 2002, followed annually by cardiology; hx of ruptured cerebral aneurysm in 2005 with repeated repair in 2012, hx of seizure order on keppra - no recent seizures - last in 2008-  Followed [facility].   She is newly followed by Nephrology Dr. Corliss Parish since last year due to CKD IV  Husband had lung mass and has surgery, stress levels have been high r/t this but slowly improving.  she currently continues to smoke 0.5 pack a day; discussed risks associated with smoking at length, patient is not ready to quit. She states she is very aware of risks and has quit in the past, but is not at a point where she is ready to do so. She is aware that she needs to make this decision for her health. She is on chantix, down from 1.5-2 packs to 0.5 pack daily.   She has a history of smoking, between the age of [age], has quit within the last 20 years or is current smoker. She has greater than a 30+ pack/smoking history and has no symptoms at this time. She qualify for low dose CT scan for lung cancer screening. A lung cancer screening counseling and shared decision was made during this visit, patient agrees with low dose CT scan.  BMI is Body mass index is 28.91 kg/m., she has been working on diet and exercise. She has a pedal exerciser at her desk and uses daily - makes small healthy choices.  Wt Readings from Last 3 Encounters:  04/02/18 184 lb 9.6 oz (83.7 kg)  12/23/17 194 lb (88 kg)  10/18/17 191 lb (86.6 kg)   Her blood pressure has been controlled at home, today their BP is BP: 110/68 She does workout- cycler at her desk.   She denies chest pain, shortness of breath, dizziness.   She is on cholesterol medication (rosuvastatin 20 mg daily) and denies myalgias. Her cholesterol is at goal. The cholesterol last visit was:   Lab Results  Component Value Date   CHOL 156 12/23/2017   HDL 38  (L) 12/23/2017   LDLCALC 97 12/23/2017   TRIG 111 12/23/2017   CHOLHDL 4.1 12/23/2017   She has been working on diet and exercise for glucose management, she is not on bASA (on plavix), she is on ACE/ARB and denies increased appetite, nausea, paresthesia of the feet, polydipsia, polyuria, visual disturbances and vomiting. Last A1C in the office was:  Lab Results  Component Value Date   HGBA1C 5.3 01/31/2017   She has CKD IV and is following with Dr. Corliss Parish since last year:  Lab Results  Component Value Date   GFRNONAA 24 (L) 12/23/2017   She is on thyroid medication. Her medication was not changed last visit.  Lab Results  Component Value Date   TSH 1.24 12/23/2017   Patient is on Vitamin D supplement.   Lab Results  Component Value Date   VD25OH 82 01/31/2017      Current Medications:  Current Outpatient Medications on File Prior to Visit  Medication Sig Dispense Refill  . clopidogrel (PLAVIX) 75 MG tablet TAKE 1 TABLET BY MOUTH  DAILY 90 tablet 0  . furosemide (LASIX) 40 MG tablet TAKE 1 TABLET BY MOUTH 2  TIMES DAILY  FOR BLOOD  PRESSURE AND FLUID 180 tablet 0  . labetalol (NORMODYNE) 300 MG tablet Take 1 tablet 2 x /day for BP 180 tablet 0  . levETIRAcetam (KEPPRA) 500 MG tablet TAKE 1 TABLET BY MOUTH  TWICE A DAY FOR SEIZURES 180 tablet 0  . levothyroxine (SYNTHROID, LEVOTHROID) 75 MCG tablet TAKE ONE TABLET BY MOUTH DAILY 90 tablet 1  . losartan (COZAAR) 50 MG tablet Take 50 mg by mouth daily.    . rosuvastatin (CRESTOR) 20 MG tablet TAKE ONE TABLET BY MOUTH DAILY FOR CHOLESTEROL. 30 tablet 0  . varenicline (CHANTIX CONTINUING MONTH PAK) 1 MG tablet Take 1 tablet (1 mg total) by mouth 2 (two) times daily. 30 tablet 2  . Vitamin D, Ergocalciferol, (DRISDOL) 50000 UNITS CAPS capsule TAKE 1 CAPSULE DAILY FOR SEVERE VITAMIN D DEFICIENCY SECONDARY TO MALABSORPTION (Patient taking differently: TAKE 1 CAPSULE EVERY OTHER DAY FOR SEVERE VITAMIN D DEFICIENCY SECONDARY TO  MALABSORPTION) 90 capsule 1   No current facility-administered medications on file prior to visit.    Allergies:  Allergies  Allergen Reactions  . Ace Inhibitors Cough  . Penicillins   . Prednisone     Dysphoria   Medical History:  She has Hypothyroidism; Hyperlipidemia; SMOKER; Essential hypertension; CAD (coronary artery disease); History of CVA (cerebrovascular accident); Peripheral vascular disease ([place]); CKD (chronic kidney disease) stage 4, GFR 15-29 ml/min ([place]); Seizure disorder ([place]); Vitamin D deficiency; Prediabetes; Poor compliance; Medication management; Overweight (BMI 25.0-29.9); and Anemia on their problem list. Health Maintenance:   Immunization History  Administered Date(s) Administered  . Influenza Inj Mdck Quad With Preservative 01/31/2017, 12/23/2017  . Influenza Split 12/02/2014  . Influenza,inj,quad, With Preservative 12/20/2015  . PPD Test 10/09/2013, 12/02/2014  . Td 10/24/2006, 01/31/2017   Tetanus: 2018 Flu vaccine: 2019 Pap: Remote - DECLINES - discussed risks, if patient changes her mind she may request at regular visit MGM: 05/2017 DEXA: Not indicated Colonoscopy: DUE - agrees to cologuard - we will call to verify until done CXR: 2015- will do CT screening  Last Dental Exam: Remote - needs top teeth out - she is planning to see an oral surgeon Last Eye Exam: Annually, last visit 2019, has cataracts, will have out this year   Patient Care Team: [name], MD as PCP - General (Internal Medicine) Nahser, Wonda Cheng, MD as PCP - Cardiology (Cardiology) Corliss Parish, MD as Consulting Physician (Nephrology)  Surgical History:  She has a past surgical history that includes Coronary artery bypass graft (2002); Embolization (2005); Cerebral aneurysm repair (Right, 2012); and Cerebral aneurysm repair (Right, 2005). Family History:  Herfamily history includes Brain cancer in her father; Heart attack in her maternal grandfather; Heart attack  (age of onset: 79) in her brother; Heart attack (age of onset: 52) in her mother; Heart disease in her brother and mother; Heart disease (age of onset: 59) in her brother; Leukemia (age of onset: 89) in her sister. Social History:  She reports that she has been smoking cigarettes. She started smoking about 45 years ago. She has a 40.00 pack-year smoking history. She has never used smokeless tobacco. She reports that she does not drink alcohol or use drugs.  Review of Systems: Review of Systems  Constitutional: Negative for malaise/fatigue and weight loss.  HENT: Negative for hearing loss and tinnitus.   Eyes: Negative for blurred vision and double vision.  Respiratory: Negative for cough, shortness of breath and wheezing.   Cardiovascular: Positive for claudication (aching, cramping in L leg with  ambulation). Negative for chest pain, palpitations, orthopnea and leg swelling.  Gastrointestinal: Negative for abdominal pain, blood in stool, constipation, diarrhea, heartburn, melena, nausea and vomiting.  Genitourinary: Negative.   Musculoskeletal: Negative for joint pain and myalgias.  Skin: Negative for rash.  Neurological: Negative for dizziness, tingling, sensory change, weakness and headaches.  Endo/Heme/Allergies: Positive for environmental allergies (She reports mild symptoms - uses OTC medications rarely as needed). Negative for polydipsia.  Psychiatric/Behavioral: Negative.   All other systems reviewed and are negative.   Physical Exam: Estimated body mass index is 28.91 kg/m as calculated from the following:   Height as of this encounter: 5\' 7"  (1.702 m).   Weight as of this encounter: 184 lb 9.6 oz (83.7 kg). BP 110/68   Pulse (!) 53   Temp (!) 97.2 F (36.2 C)   Ht 5\' 7"  (1.702 m)   Wt 184 lb 9.6 oz (83.7 kg)   SpO2 97%   BMI 28.91 kg/m  General Appearance: Well nourished, in no apparent distress.  Eyes: PERRLA, EOMs, conjunctiva no swelling or erythema.  Sinuses: No  Frontal/maxillary tenderness  ENT/Mouth: Ext aud canals clear, normal light reflex with TMs without erythema, bulging. Extremely poor dentition - broken/missing/black teeth. NO ulcers, glands normal to palpation. No erythema, swelling, or exudate on post pharynx. Tonsils not swollen or erythematous. Hearing normal.  Neck: Supple, thyroid normal. No bruits  Respiratory: Respiratory effort normal, BS present but somewhat coarse throughout; no wheezing, stridor.  Cardio: RRR without murmurs, rubs or gallops. Thready symmetrical peripheral pulses with mild non-pitting edema  Chest: symmetric, with normal excursions and percussion.  Breasts: Irregular/glandular texture throughout, without distinct lumps, nipple discharge, retractions. Questionable L anterior axillary lymph node< 1 cm, getting CT Abdomen: Soft, nontender, no guarding, rebound, hernias, masses, or organomegaly.  Lymphatics: Non tender without lymphadenopathy.  Genitourinary: Patient declines Musculoskeletal: Full ROM all peripheral extremities,5/5 strength, and normal gait.  Skin: Warm, dry without rashes, lesions, ecchymosis. Neuro: Cranial nerves intact, reflexes equal bilaterally. Normal muscle tone, no cerebellar symptoms. Sensation intact.  Psych: Awake and oriented X 3, normal affect, Insight and Judgment appropriate.   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

## 2018-04-02 ENCOUNTER — Ambulatory Visit (INDEPENDENT_AMBULATORY_CARE_PROVIDER_SITE_OTHER): Payer: 59 | Admitting: Adult Health

## 2018-04-02 ENCOUNTER — Encounter: Payer: Self-pay | Admitting: Adult Health

## 2018-04-02 VITALS — BP 110/68 | HR 53 | Temp 97.2°F | Ht 67.0 in | Wt 184.6 lb

## 2018-04-02 DIAGNOSIS — E782 Mixed hyperlipidemia: Secondary | ICD-10-CM

## 2018-04-02 DIAGNOSIS — G40909 Epilepsy, unspecified, not intractable, without status epilepticus: Secondary | ICD-10-CM

## 2018-04-02 DIAGNOSIS — Z136 Encounter for screening for cardiovascular disorders: Secondary | ICD-10-CM

## 2018-04-02 DIAGNOSIS — Z0001 Encounter for general adult medical examination with abnormal findings: Secondary | ICD-10-CM

## 2018-04-02 DIAGNOSIS — R0989 Other specified symptoms and signs involving the circulatory and respiratory systems: Secondary | ICD-10-CM

## 2018-04-02 DIAGNOSIS — I739 Peripheral vascular disease, unspecified: Secondary | ICD-10-CM | POA: Diagnosis not present

## 2018-04-02 DIAGNOSIS — F17210 Nicotine dependence, cigarettes, uncomplicated: Secondary | ICD-10-CM

## 2018-04-02 DIAGNOSIS — E039 Hypothyroidism, unspecified: Secondary | ICD-10-CM

## 2018-04-02 DIAGNOSIS — N184 Chronic kidney disease, stage 4 (severe): Secondary | ICD-10-CM | POA: Diagnosis not present

## 2018-04-02 DIAGNOSIS — I2510 Atherosclerotic heart disease of native coronary artery without angina pectoris: Secondary | ICD-10-CM

## 2018-04-02 DIAGNOSIS — E663 Overweight: Secondary | ICD-10-CM

## 2018-04-02 DIAGNOSIS — Z79899 Other long term (current) drug therapy: Secondary | ICD-10-CM

## 2018-04-02 DIAGNOSIS — E559 Vitamin D deficiency, unspecified: Secondary | ICD-10-CM

## 2018-04-02 DIAGNOSIS — Z0000 Encounter for general adult medical examination without abnormal findings: Secondary | ICD-10-CM

## 2018-04-02 DIAGNOSIS — I10 Essential (primary) hypertension: Secondary | ICD-10-CM | POA: Diagnosis not present

## 2018-04-02 DIAGNOSIS — Z122 Encounter for screening for malignant neoplasm of respiratory organs: Secondary | ICD-10-CM

## 2018-04-02 DIAGNOSIS — D649 Anemia, unspecified: Secondary | ICD-10-CM | POA: Diagnosis not present

## 2018-04-02 DIAGNOSIS — F17200 Nicotine dependence, unspecified, uncomplicated: Secondary | ICD-10-CM

## 2018-04-02 DIAGNOSIS — R7303 Prediabetes: Secondary | ICD-10-CM

## 2018-04-02 DIAGNOSIS — Z8673 Personal history of transient ischemic attack (TIA), and cerebral infarction without residual deficits: Secondary | ICD-10-CM

## 2018-04-02 NOTE — Patient Instructions (Addendum)
PLEASE do cologuard - put in bathroom, do early in week, not on Friday   Think about doing 1 last PAP - will check HPV - if negative, then DONE    Olivia Walsh , Thank you for taking time to come for your Medicare Wellness Visit. I appreciate your ongoing commitment to your health goals. Please review the following plan we discussed and let me know if I can assist you in the future.   These are the goals we discussed: Goals    . DIET - INCREASE WATER INTAKE     Aim for 4-5 bottles daily     . LDL CALC < 70    . Quit Smoking    . Weight (lb) < 180 lb (81.6 kg)       This is a list of the screening recommended for you and due dates:  Health Maintenance  Topic Date Due  . Pap Smear  04/02/2018*  . Colon Cancer Screening  06/01/2018*  . Mammogram  06/27/2019  . Tetanus Vaccine  02/01/2027  . Flu Shot  Completed  .  Hepatitis C: One time screening is recommended by Center for Disease Control  (CDC) for  adults born from [birth year] through 1967.   Completed  . HIV Screening  Completed  *Topic was postponed. The date shown is not the original due date.      Intermittent Claudication Intermittent claudication is pain in one or both legs that occurs when walking or exercising and goes away when resting. Intermittent claudication is a symptom of peripheral arterial disease (PAD). This condition is commonly treated with rest, medicine, and healthy lifestyle changes. If medical management does not improve symptoms, surgery can be done to restore blood flow (revascularization) to the affected leg. What are the causes?  This condition is caused by buildup of fatty material (plaque) within the major arteries in the body (atherosclerosis). Plaque makes arteries stiff and narrow, which prevents enough blood from reaching the leg muscles. Pain occurs when you walk or exercise because your muscles need (but cannot get) more blood when you are moving and exercising. What increases the risk? The following  factors may make you more likely to develop this condition:  A family history of atherosclerosis.  A personal history of stroke or heart disease.  Older age.  Being inactive (sedentary lifestyle).  Being overweight.  Smoking cigarettes.  Having another health condition such as: ? Diabetes. ? High blood pressure. ? High cholesterol. What are the signs or symptoms? Symptoms of this condition may first develop in the lower leg, and then they may spread to the thigh, hip, buttock, or the back of the lower leg (calf) over time. Symptoms may include:  Aches or pains.  Cramps.  A feeling of tightness, weakness, or heaviness.  A wound on the lower leg or foot that heals poorly or does not heal. How is this diagnosed? This condition may be diagnosed based on:  Your symptoms.  Your medical history.  Tests, such as: ? Blood tests. ? Arterial duplex ultrasound. This test uses images of blood vessels and surrounding organs to evaluate blood flow within arteries. ? Angiogram. In this procedure, dye is injected into arteries and then X-rays are taken. ? Magnetic resonance angiogram (MRA). In this procedure, strong magnets and radio waves are used instead of X-rays to create images of blood vessels and blood flow. ? CT angiogram (CTA). In this procedure, a large X-ray machine called a CT scanner takes detailed pictures of  blood vessels that have been injected with dye. ? Ankle-brachial index (ABI) test. This procedure measures blood pressure in the leg during exercise and at rest. ? Exercise test. For this test, you will walk on a treadmill while tests are done (such as the ABI test) to evaluate how this condition affects your ability to walk or exercise. How is this treated? Treatment for this condition may involve treatment for the underlying cause, such as treatment for high blood pressure, high cholesterol, or diabetes. Treatment may include:  Lifestyle changes such as: ? Starting  a supervised or home-based exercise program. ? Losing weight. ? Quitting smoking.  Medicines to help restore blood flow through your legs.  Blood vessel surgery (angioplasty) to restore blood flow around the blocked vessel. This is also known as endovascular therapy (EVT). This is only done if your intermittent claudication is caused by severe peripheral artery disease, a condition in which blood flow is severely or totally restricted by the narrowing of the arteries. Follow these instructions at home: Lifestyle   Maintain a healthy weight.  Eat a diet that is low in saturated fats and calories. Consider working with a diet and nutrition specialist (dietitian) to help you make healthy food choices.  Do not use any products that contain nicotine or tobacco, such as cigarettes and e-cigarettes. If you need help quitting, ask your health care provider.  If your health care provider recommended an exercise program for you, follow it as directed. Your exercise program may involve: ? Walking 3 or more times a week. ? Walking until you have certain symptoms of intermittent claudication. ? Resting until symptoms go away. ? Gradually increasing your walking time to about 50 minutes a day. General instructions  Work with your health care provider to manage any other health conditions you may have, including diabetes, high blood pressure, or high cholesterol.  Take over-the-counter and prescription medicines only as told by your health care provider.  Keep all follow-up visits as told by your health care provider. This is important. Contact a health care provider if:  Your pain does not go away with rest.  You have sores on your legs that do not heal or have a bad smell or pus coming from them.  Your condition gets worse or does not get better with treatment. Get help right away if:  You have chest pain.  You have difficulty breathing.  You develop arm weakness.  You have trouble  speaking.  Your face begins to droop.  Your foot or leg is cold or it changes color.  Your foot or leg becomes numb. These symptoms may represent a serious problem that is an emergency. Do not wait to see if the symptoms will go away. Get medical help right away. Call your local emergency services (911 in the [place] Do not drive yourself to the hospital.  Summary  Intermittent claudication is pain in one or both legs that occurs when walking or exercising and goes away when resting.  This condition is caused by buildup of fatty material (plaque) within the major arteries in the body (atherosclerosis). Plaque makes arteries stiff and narrow, which prevents enough blood from reaching the leg muscles.  Intermittent claudication can be treated with medicine and lifestyle changes. If medical treatment fails, surgery can be done to help return blood flow to the affected area.  Make sure you work with your health care provider to manage any other health conditions you may have, including diabetes, high blood pressure, or  high cholesterol. This information is not intended to replace advice given to you by your health care provider. Make sure you discuss any questions you have with your health care provider. Document Released: 12/16/2003 Document Revised: 03/15/2016 Document Reviewed: 03/15/2016 Elsevier Interactive Patient Education  2019 [employer].

## 2018-04-03 ENCOUNTER — Other Ambulatory Visit: Payer: Self-pay | Admitting: Adult Health

## 2018-04-03 DIAGNOSIS — E785 Hyperlipidemia, unspecified: Secondary | ICD-10-CM

## 2018-04-03 MED ORDER — ROSUVASTATIN CALCIUM 40 MG PO TABS: ORAL_TABLET | ORAL | 1 refills | Status: DC

## 2018-04-05 LAB — CBC WITH DIFFERENTIAL/PLATELET
ABSOLUTE MONOCYTES: 446 {cells}/uL (ref 200–950)
Basophils Absolute: 50 cells/uL (ref 0–200)
Basophils Relative: 0.9 %
Eosinophils Absolute: 154 cells/uL (ref 15–500)
Eosinophils Relative: 2.8 %
HEMATOCRIT: 33 % — AB (ref 35.0–45.0)
Hemoglobin: 11 g/dL — ABNORMAL LOW (ref 11.7–15.5)
Lymphs Abs: 1485 cells/uL (ref 850–3900)
MCH: 31.4 pg (ref 27.0–33.0)
MCHC: 33.3 g/dL (ref 32.0–36.0)
MCV: 94.3 fL (ref 80.0–100.0)
MPV: 10.8 fL (ref 7.5–12.5)
Monocytes Relative: 8.1 %
Neutro Abs: 3366 cells/uL (ref 1500–7800)
Neutrophils Relative %: 61.2 %
Platelets: 193 10*3/uL (ref 140–400)
RBC: 3.5 10*6/uL — ABNORMAL LOW (ref 3.80–5.10)
RDW: 12.4 % (ref 11.0–15.0)
Total Lymphocyte: 27 %
WBC: 5.5 10*3/uL (ref 3.8–10.8)

## 2018-04-05 LAB — COMPLETE METABOLIC PANEL WITH GFR
AG RATIO: 1.9 (calc) (ref 1.0–2.5)
ALT: 9 U/L (ref 6–29)
AST: 14 U/L (ref 10–35)
Albumin: 4.3 g/dL (ref 3.6–5.1)
Alkaline phosphatase (APISO): 48 U/L (ref 37–153)
BUN/Creatinine Ratio: 19 (calc) (ref 6–22)
BUN: 45 mg/dL — ABNORMAL HIGH (ref 7–25)
CALCIUM: 9.7 mg/dL (ref 8.6–10.4)
CO2: 25 mmol/L (ref 20–32)
Chloride: 98 mmol/L (ref 98–110)
Creat: 2.34 mg/dL — ABNORMAL HIGH (ref 0.50–1.05)
GFR, EST NON AFRICAN AMERICAN: 22 mL/min/{1.73_m2} — AB (ref >=60)
GFR, Est African American: 26 mL/min/{1.73_m2} — ABNORMAL LOW (ref >=60)
Globulin: 2.3 g/dL (calc) (ref 1.9–3.7)
Glucose, Bld: 86 mg/dL (ref 65–99)
POTASSIUM: 4.6 mmol/L (ref 3.5–5.3)
Sodium: 135 mmol/L (ref 135–146)
Total Bilirubin: 0.4 mg/dL (ref 0.2–1.2)
Total Protein: 6.6 g/dL (ref 6.1–8.1)

## 2018-04-05 LAB — URINALYSIS, ROUTINE W REFLEX MICROSCOPIC
Bilirubin Urine: NEGATIVE
Glucose, UA: NEGATIVE
Hgb urine dipstick: NEGATIVE
Ketones, ur: NEGATIVE
Leukocytes, UA: NEGATIVE
NITRITE: NEGATIVE
Protein, ur: NEGATIVE
Specific Gravity, Urine: 1.011 (ref 1.001–1.03)

## 2018-04-05 LAB — MAGNESIUM: Magnesium: 1.9 mg/dL (ref 1.5–2.5)

## 2018-04-05 LAB — LIPID PANEL
Cholesterol: 161 mg/dL (ref <200)
HDL: 33 mg/dL — ABNORMAL LOW (ref >=50)
LDL Cholesterol (Calc): 101 mg/dL (calc) — ABNORMAL HIGH
Non-HDL Cholesterol (Calc): 128 mg/dL (calc) (ref <130)
Total CHOL/HDL Ratio: 4.9 (calc) (ref <5.0)
Triglycerides: 170 mg/dL — ABNORMAL HIGH (ref <150)

## 2018-04-05 LAB — IRON, TOTAL/TOTAL IRON BINDING CAP
%SAT: 26 % (calc) (ref 16–45)
Iron: 66 ug/dL (ref 45–160)
TIBC: 250 mcg/dL (calc) (ref 250–450)

## 2018-04-05 LAB — TSH: TSH: 1 mIU/L (ref 0.40–4.50)

## 2018-04-05 LAB — MICROALBUMIN / CREATININE URINE RATIO
Creatinine, Urine: 62 mg/dL (ref 20–275)
Microalb Creat Ratio: 5 mcg/mg creat (ref <30)
Microalb, Ur: 0.3 mg/dL

## 2018-04-05 LAB — HEMOGLOBIN A1C
Hgb A1c MFr Bld: 5.6 % of total Hgb (ref <5.7)
Mean Plasma Glucose: 114 (calc)
eAG (mmol/L): 6.3 (calc)

## 2018-04-05 LAB — VITAMIN D 25 HYDROXY (VIT D DEFICIENCY, FRACTURES): Vit D, 25-Hydroxy: 57 ng/mL (ref 30–100)

## 2018-04-05 LAB — VITAMIN B12: Vitamin B-12: 269 pg/mL (ref 200–1100)

## 2018-04-05 LAB — LEVETIRACETAM LEVEL: Keppra (Levetiracetam): 37.9 ug/mL (ref 12.0–46.0)

## 2018-04-09 DIAGNOSIS — N140 Analgesic nephropathy: Secondary | ICD-10-CM | POA: Diagnosis not present

## 2018-04-09 DIAGNOSIS — N183 Chronic kidney disease, stage 3 (moderate): Secondary | ICD-10-CM | POA: Diagnosis not present

## 2018-04-09 DIAGNOSIS — I129 Hypertensive chronic kidney disease with stage 1 through stage 4 chronic kidney disease, or unspecified chronic kidney disease: Secondary | ICD-10-CM | POA: Diagnosis not present

## 2018-04-10 ENCOUNTER — Telehealth: Payer: Self-pay | Admitting: *Deleted

## 2018-04-10 NOTE — Telephone Encounter (Signed)
   [facility] Pre-operative Risk Assessment    Request for surgical clearance:  1. What type of surgery is being performed? RIGHT EYE CATARACT EXTRACTION W/INTRAOCULAR LENS IMPLANT FOLLOWED BY LEFT EYE ON 05/22/18  2. When is this surgery scheduled? 04/24/18 AND 05/22/18   3. What type of clearance is required (medical clearance vs. Pharmacy clearance to hold med vs. Both)? MEDICAL  4. Are there any medications that need to be held prior to surgery and how long. NO MEDICATIONS ARE NEEDING TO BE HELD PER DR. LISA SUN   5. Practice name and name of physician performing surgery? [facility]; DR. LISA SUN   6. What is your office phone number [phone]    7.   What is your office fax number [phone]  8.   Anesthesia type (None, local, MAC, general) ? TOPICAL WITH IV MEDICATIONS   Julaine Hua 04/10/2018, 1:23 PM  _________________________________________________________________   (provider comments below)

## 2018-04-10 NOTE — Telephone Encounter (Signed)
   Primary Cardiologist: Mertie Moores, MD  Chart reviewed as part of pre-operative protocol coverage. Cataract extractions are recognized in guidelines as low risk surgeries that do not typically require specific preoperative testing or holding of blood thinner therapy. Therefore, given past medical history and time since last visit, based on ACC/AHA guidelines, Olivia Walsh would be at acceptable risk for the planned procedure without further cardiovascular testing.   I will route this recommendation to the requesting party via Epic fax function and remove from pre-op pool.  Please call with questions.  [place], [place] 04/10/2018, 3:03 PM

## 2018-04-10 NOTE — Addendum Note (Signed)
Addended by: Izora Ribas on: 04/10/2018 03:43 PM   Modules accepted: Orders

## 2018-04-11 ENCOUNTER — Other Ambulatory Visit: Payer: Self-pay | Admitting: Adult Health

## 2018-04-11 DIAGNOSIS — I10 Essential (primary) hypertension: Secondary | ICD-10-CM

## 2018-04-17 ENCOUNTER — Other Ambulatory Visit: Payer: Self-pay | Admitting: Internal Medicine

## 2018-04-17 DIAGNOSIS — R569 Unspecified convulsions: Secondary | ICD-10-CM

## 2018-04-24 ENCOUNTER — Other Ambulatory Visit: Payer: Self-pay | Admitting: Adult Health

## 2018-04-24 DIAGNOSIS — H25011 Cortical age-related cataract, right eye: Secondary | ICD-10-CM | POA: Diagnosis not present

## 2018-04-24 DIAGNOSIS — I739 Peripheral vascular disease, unspecified: Secondary | ICD-10-CM

## 2018-04-24 DIAGNOSIS — H2511 Age-related nuclear cataract, right eye: Secondary | ICD-10-CM | POA: Diagnosis not present

## 2018-04-24 DIAGNOSIS — R0989 Other specified symptoms and signs involving the circulatory and respiratory systems: Secondary | ICD-10-CM

## 2018-04-25 DIAGNOSIS — H25012 Cortical age-related cataract, left eye: Secondary | ICD-10-CM | POA: Diagnosis not present

## 2018-04-25 DIAGNOSIS — H2512 Age-related nuclear cataract, left eye: Secondary | ICD-10-CM | POA: Diagnosis not present

## 2018-04-29 ENCOUNTER — Inpatient Hospital Stay (HOSPITAL_COMMUNITY): Admission: RE | Admit: 2018-04-29 | Payer: 59 | Source: Ambulatory Visit

## 2018-05-20 ENCOUNTER — Encounter (HOSPITAL_COMMUNITY): Payer: 59

## 2018-06-16 ENCOUNTER — Encounter (HOSPITAL_COMMUNITY): Payer: 59

## 2018-07-03 ENCOUNTER — Other Ambulatory Visit: Payer: Self-pay | Admitting: Adult Health

## 2018-07-03 DIAGNOSIS — E785 Hyperlipidemia, unspecified: Secondary | ICD-10-CM

## 2018-07-07 NOTE — Progress Notes (Deleted)
FOLLOW UP  Assessment and Plan:    CAD Continue plavix, ASA Control blood pressure, cholesterol, glucose, increase exercise.  Continue cardiology follow up  Hypertension Well controlled with current medications  Monitor blood pressure at home; patient to call if consistently greater than 130/80 Continue DASH diet.   Reminder to go to the ER if any CP, SOB, nausea, dizziness, severe HA, changes vision/speech, left arm numbness and tingling and jaw pain.  Cholesterol Currently mildly above LDL goal (<70); was increased to 40 mg daily at last visit Continue low cholesterol diet and exercise.  Check lipid panel.   Other abnormal glucose Recent A1Cs at goal Discussed diet/exercise, weight management  Defer A1C; check CMP  CKD stage 4 Followed by Dr. Moshe Cipro Increase fluids, avoid NSAIDS, monitor sugars, will monitor Check CMP/GFR  Hypothyroidism continue medications the same pending lab results reminded to take on an empty stomach 30-45mins before food.  check TSH level  Overweight with co morbidities Long discussion about weight loss, diet, and exercise Recommended diet heavy in fruits and veggies and low in animal meats, cheeses, and dairy products, appropriate calorie intake Will follow up in 3 months  Vitamin D Def At goal at last visit; continue supplementation to maintain goal of 60-100 Defer Vit D level  Tobacco use Discussed risks associated with tobacco use and advised to reduce or quit Patient is ready to do so and plans to try nicotine gum and slow taper Declines Chantix or other medication, but will consider, discussed and printed script for her to hold and information about steps to quit, strategies and chantix provided Will follow up at the next visit  Seizures Last seizure remote; stable on keppra Check serum keppra levels    Continue diet and meds as discussed. Further disposition pending results of labs. Discussed med's effects and SE's.    Over 30 minutes of exam, counseling, chart review, and critical decision making was performed.   Future Appointments  Date Time Provider [place]  07/08/2018  8:45 AM Liane Comber, NP [facility] None  07/28/2018  8:00 AM [facility] VASC 2 [facility]  08/05/2018  4:30 PM [facility] 1 [facility]  10/10/2018  8:45 AM Liane Comber, NP [facility] None    ----------------------------------------------------------------------------------------------------------------------  HPI 58 y.o. female  presents for 3 month follow up on hypertension, cholesterol, glucose management, weight and vitamin D deficiency. She has ASCVD with bypass in 2002, followed annually by cardiology; hx of ruptured cerebral aneurysm in 2005 with repeated repair in 2012, hx of seizure order on keppra - no recent seizures - last in 2008-  Followed by [facility].   Last visit she reported sx of claudication in LLE; deminished pulses were noted and she was referred for peripheral arterial studies ***  she currently continues to smoke 1 pack a day; discussed risks associated with smoking, patient is ready to quit with her husband, trying nicotine gum and slow taper, declines chantix today but will consider if needed.    BMI is There is no height or weight on file to calculate BMI., she has been working on diet and exercise, has been watching portions, avoiding morning biscuit, breads, etc but was on vacation last week, just got back from [facility] [place]. She has pedal machine at home under work desk.  Wt Readings from Last 3 Encounters:  04/02/18 184 lb 9.6 oz (83.7 kg)  12/23/17 194 lb (88 kg)  10/18/17 191 lb (86.6 kg)   Her blood pressure has  been controlled at home, today their BP is    She does workout occasionally - has seated cycling machine that she does while at her desk. She denies chest pain, shortness of breath, dizziness.    She is on cholesterol medication (crestor 40*** mg daily)  and denies myalgias. Her LDL cholesterol is not at goal.The cholesterol last visit was:   Lab Results  Component Value Date   CHOL 161 04/02/2018   HDL 33 (L) 04/02/2018   LDLCALC 101 (H) 04/02/2018   TRIG 170 (H) 04/02/2018   CHOLHDL 4.9 04/02/2018    She has been working on diet and exercise for glucose management (intermittent hx of prediabetes), and denies foot ulcerations, increased appetite, nausea, paresthesia of the feet, polydipsia, polyuria, visual disturbances, vomiting and weight loss. Last A1C in the office was:  Lab Results  Component Value Date   HGBA1C 5.6 04/02/2018   She is on thyroid medication. Her medication was not changed last visit.   Lab Results  Component Value Date   TSH 1.00 04/02/2018    She is followed by Dr. Corliss Parish for CKD 3/4, just saw her a few weeks ago and she is pleased with current status. She has stopped taking goody powders and trying to quit smoking. She drinks 3 bottles of water daily.  Lab Results  Component Value Date   GFRNONAA 22 (L) 04/02/2018   Patient is on Vitamin D supplement and near goal at recent check:    Lab Results  Component Value Date   VD25OH 57 04/02/2018       Current Medications:  Current Outpatient Medications on File Prior to Visit  Medication Sig  . clopidogrel (PLAVIX) 75 MG tablet TAKE 1 TABLET BY MOUTH  DAILY  . furosemide (LASIX) 40 MG tablet TAKE 1 TABLET BY MOUTH 2  TIMES DAILY FOR BLOOD  PRESSURE AND FLUID  . labetalol (NORMODYNE) 300 MG tablet TAKE 1 TABLET BY MOUTH TWO  TIMES DAILY  . levETIRAcetam (KEPPRA) 500 MG tablet TAKE 1 TABLET BY MOUTH  TWICE A DAY FOR SEIZURES  . levothyroxine (SYNTHROID, LEVOTHROID) 75 MCG tablet TAKE ONE TABLET BY MOUTH DAILY  . losartan (COZAAR) 50 MG tablet Take 50 mg by mouth daily.  . rosuvastatin (CRESTOR) 20 MG tablet TAKE 1 TABLET BY MOUTH  DAILY FOR CHOLESTEROL  . rosuvastatin (CRESTOR) 40 MG tablet TAKE ONE TABLET BY MOUTH DAILY FOR CHOLESTEROL.  Marland Kitchen  varenicline (CHANTIX CONTINUING MONTH PAK) 1 MG tablet Take 1 tablet (1 mg total) by mouth 2 (two) times daily.  . Vitamin D, Ergocalciferol, (DRISDOL) 50000 UNITS CAPS capsule TAKE 1 CAPSULE DAILY FOR SEVERE VITAMIN D DEFICIENCY SECONDARY TO MALABSORPTION (Patient taking differently: TAKE 1 CAPSULE EVERY OTHER DAY FOR SEVERE VITAMIN D DEFICIENCY SECONDARY TO MALABSORPTION)   No current facility-administered medications on file prior to visit.      Allergies:  Allergies  Allergen Reactions  . Ace Inhibitors Cough  . Penicillins   . Prednisone     Dysphoria     Medical History:  Past Medical History:  Diagnosis Date  . CAD (coronary artery disease)   . CKD (chronic kidney disease) stage 3, GFR 30-59 ml/min (HCC)   . History of basal cell carcinoma excision 12/20/2017  . Hyperlipidemia LDL goal <70   . Hypertension   . Hypothyroidism   . Tobacco abuse   . Vitamin D deficiency    Family history- Reviewed and unchanged Social history- Reviewed and unchanged   Review of Systems:  Review of Systems  Constitutional: Negative for malaise/fatigue and weight loss.  HENT: Negative for hearing loss and tinnitus.   Eyes: Negative for blurred vision and double vision.  Respiratory: Negative for cough, shortness of breath and wheezing.   Cardiovascular: Negative for chest pain, palpitations, orthopnea, claudication and leg swelling.  Gastrointestinal: Negative for abdominal pain, blood in stool, constipation, diarrhea, heartburn, melena, nausea and vomiting.  Genitourinary: Negative.   Musculoskeletal: Negative for joint pain and myalgias.  Skin: Negative for rash.  Neurological: Negative for dizziness, tingling, sensory change, weakness and headaches.  Endo/Heme/Allergies: Negative for polydipsia.  Psychiatric/Behavioral: Negative.   All other systems reviewed and are negative.    Physical Exam: There were no vitals taken for this visit. Wt Readings from Last 3 Encounters:   04/02/18 184 lb 9.6 oz (83.7 kg)  12/23/17 194 lb (88 kg)  10/18/17 191 lb (86.6 kg)   General Appearance: Well nourished, in no apparent distress. Eyes: PERRLA, EOMs, conjunctiva no swelling or erythema Sinuses: No Frontal/maxillary tenderness ENT/Mouth: Ext aud canals clear, TMs without erythema, bulging. No erythema, swelling, or exudate on post pharynx.  Tonsils not swollen or erythematous. Hearing normal.  Neck: Supple, thyroid normal.  Respiratory: Respiratory effort normal, BS equal bilaterally without rales, rhonchi, wheezing or stridor.  Cardio: RRR with 3/6 decreshendo systolic murmur. Brisk peripheral pulses with nonpitting edema to bilateral ankles/lower legs, chronic and unchanged.  Abdomen: Soft, + BS.  Non tender, no guarding, rebound, hernias, masses. Lymphatics: Non tender without lymphadenopathy.  Musculoskeletal: Full ROM, 5/5 strength, Normal gait Skin: Warm, dry; multiple pearly growths to face and neck without erythema or ulceration. Open wound to R medial lower leg (recent Moh's at derm per patient).  Neuro: Cranial nerves intact. No cerebellar symptoms. Sensation intact.  Psych: Awake and oriented X 3, normal affect, Insight and Judgment appropriate.    Izora Ribas, NP 7:52 AM [facility]

## 2018-07-08 ENCOUNTER — Ambulatory Visit: Payer: Self-pay | Admitting: Adult Health

## 2018-07-15 ENCOUNTER — Ambulatory Visit: Payer: 59

## 2018-07-20 ENCOUNTER — Other Ambulatory Visit: Payer: Self-pay | Admitting: Adult Health

## 2018-07-28 ENCOUNTER — Encounter (HOSPITAL_COMMUNITY): Payer: 59

## 2018-08-01 ENCOUNTER — Other Ambulatory Visit: Payer: Self-pay | Admitting: Internal Medicine

## 2018-08-01 DIAGNOSIS — E039 Hypothyroidism, unspecified: Secondary | ICD-10-CM

## 2018-08-01 MED ORDER — LEVOTHYROXINE SODIUM 75 MCG PO TABS: ORAL_TABLET | ORAL | 1 refills | Status: DC

## 2018-08-05 ENCOUNTER — Inpatient Hospital Stay: Admission: RE | Admit: 2018-08-05 | Payer: 59 | Source: Ambulatory Visit

## 2018-08-06 ENCOUNTER — Other Ambulatory Visit: Payer: Self-pay | Admitting: Physician Assistant

## 2018-08-06 ENCOUNTER — Other Ambulatory Visit: Payer: Self-pay | Admitting: Adult Health

## 2018-08-06 DIAGNOSIS — E039 Hypothyroidism, unspecified: Secondary | ICD-10-CM

## 2018-08-06 DIAGNOSIS — R569 Unspecified convulsions: Secondary | ICD-10-CM

## 2018-08-06 MED ORDER — LEVOTHYROXINE SODIUM 75 MCG PO TABS: ORAL_TABLET | ORAL | 1 refills | Status: DC

## 2018-08-14 ENCOUNTER — Ambulatory Visit: Payer: Self-pay | Admitting: Adult Health

## 2018-08-15 ENCOUNTER — Ambulatory Visit (HOSPITAL_COMMUNITY): Payer: 59

## 2018-08-22 ENCOUNTER — Ambulatory Visit
Admission: RE | Admit: 2018-08-22 | Discharge: 2018-08-22 | Disposition: A | Discharge status: Home / Self Care | Payer: 59 | Source: Ambulatory Visit | Attending: Adult Health | Admitting: Adult Health

## 2018-08-22 DIAGNOSIS — Z122 Encounter for screening for malignant neoplasm of respiratory organs: Secondary | ICD-10-CM

## 2018-08-22 DIAGNOSIS — F17200 Nicotine dependence, unspecified, uncomplicated: Secondary | ICD-10-CM

## 2018-08-22 DIAGNOSIS — F17210 Nicotine dependence, cigarettes, uncomplicated: Secondary | ICD-10-CM

## 2018-08-25 ENCOUNTER — Encounter: Payer: Self-pay | Admitting: Adult Health

## 2018-08-25 DIAGNOSIS — I700 Atherosclerosis of aorta: Secondary | ICD-10-CM | POA: Insufficient documentation

## 2018-08-25 DIAGNOSIS — J439 Emphysema, unspecified: Secondary | ICD-10-CM | POA: Insufficient documentation

## 2018-08-25 DIAGNOSIS — K8020 Calculus of gallbladder without cholecystitis without obstruction: Secondary | ICD-10-CM | POA: Insufficient documentation

## 2018-08-26 ENCOUNTER — Encounter (HOSPITAL_COMMUNITY): Payer: 59

## 2018-09-04 ENCOUNTER — Ambulatory Visit: Payer: Self-pay | Admitting: Adult Health

## 2018-09-11 ENCOUNTER — Encounter (HOSPITAL_COMMUNITY): Payer: 59

## 2018-10-05 ENCOUNTER — Other Ambulatory Visit: Payer: Self-pay | Admitting: Adult Health

## 2018-10-10 ENCOUNTER — Ambulatory Visit: Payer: Self-pay | Admitting: Adult Health

## 2018-10-10 NOTE — Progress Notes (Signed)
FOLLOW UP  Assessment and Plan:    CAD Continue plavix, ASA Control blood pressure, cholesterol, glucose, increase exercise.  Continue cardiology follow up  Hypertension Well controlled with current medications  Monitor blood pressure at home; patient to call if consistently greater than 130/80 Continue DASH diet.   Reminder to go to the ER if any CP, SOB, nausea, dizziness, severe HA, changes vision/speech, left arm numbness and tingling and jaw pain.  Cholesterol Currently mildly above LDL goal (<70); recently increased from rosuvastatin 20 to 40 mg daily  Continue low cholesterol diet and exercise.  Check lipid panel.   Other abnormal glucose Recent A1Cs at goal Discussed diet/exercise, weight management  Defer A1C; check CMP  CKD stage 4 Followed by Dr. Moshe Cipro Increase fluids, avoid NSAIDS, monitor sugars, will monitor Check CMP/GFR  Hypothyroidism continue medications the same pending lab results reminded to take on an empty stomach 30-52mins before food.  check TSH level  Overweight with co morbidities Long discussion about weight loss, diet, and exercise Recommended diet heavy in fruits and veggies and low in animal meats, cheeses, and dairy products, appropriate calorie intake Will follow up in 3 months  Vitamin D Def At goal at last visit; continue supplementation to maintain goal of 60-100 Defer Vit D level  Tobacco use Discussed risks associated with tobacco use and advised to reduce or quit Patient is ready to do so and plans to try nicotine gum and slow taper She doesn't want to do chantix, but open try trying wellbutrin for mood and smoking 30 day supply sent in; she will call back for refill if helpful Will follow up at the next visit  Seizures Last seizure remote; stable on keppra  PVD/intermittent claudication  Pending ABI rescheduled due to covid 19 She is on statin, plavix; have encouraged smoking cessation which she is working  on Control blood pressure, cholesterol, glucose, increase exercise, rest frequently as neede   Continue diet and meds as discussed. Further disposition pending results of labs. Discussed med's effects and SE's.   Over 30 minutes of exam, counseling, chart review, and critical decision making was performed.   No future appointments.  ----------------------------------------------------------------------------------------------------------------------  HPI 58 y.o. female  presents for 3 month follow up on hypertension, cholesterol, glucose management, weight and vitamin D deficiency.   She has ASCVD with bypass in 2002, followed annually by cardiology; hx of ruptured cerebral aneurysm in 2005 with repeated repair in 2012, hx of seizure order on keppra - no recent seizures - last in 2008-  Followed by [facility].   She has hx of PVD, has reported claudication symptoms of LLE ongoing for several months, ABI was ordered but has been rescheduled in light of covid 19. She is on statin and plavix.  she currently continues to smoke 1 pack a day; discussed risks associated with smoking, patient is not ready to quit; she tried quitting with her husband (he had lung CA), nicotine gum and slow taper but didn't do well with pandemic; she has chantix but never started as not ready.   She had benign appearing CT lung cancer screen on 08/22/2018; she does have mild centrolobular emphysema by imaging,   BMI is Body mass index is 27.75 kg/m., she has been working on diet and exercise, has been watching portions, being more conscious, avoiding morning biscuit, breads, soda, etc  She has pedal machine at home under work desk.  Wt Readings from Last 3 Encounters:  10/13/18 177 lb 3.2 oz (80.4  kg)  04/02/18 184 lb 9.6 oz (83.7 kg)  12/23/17 194 lb (88 kg)   Her blood pressure has been controlled at home (110-120s/80s), today their BP is BP: 140/88 Dr. Moshe Cipro stopped labetalol due to episodes of  hypotension  She does workout occasionally - has seated cycling machine that she does while at her desk. She denies chest pain, shortness of breath, dizziness.    She is on cholesterol medication (crestor 40 mg daily) and denies myalgias. Her LDL cholesterol is not at goal.The cholesterol last visit was:   Lab Results  Component Value Date   CHOL 161 04/02/2018   HDL 33 (L) 04/02/2018   LDLCALC 101 (H) 04/02/2018   TRIG 170 (H) 04/02/2018   CHOLHDL 4.9 04/02/2018    She has been working on diet and exercise for glucose management (intermittent hx of prediabetes), and denies foot ulcerations, increased appetite, nausea, paresthesia of the feet, polydipsia, polyuria, visual disturbances, vomiting and weight loss. Last A1C in the office was:  Lab Results  Component Value Date   HGBA1C 5.6 04/02/2018   She is on thyroid medication. Her medication was not changed last visit. 75 mcg daily.  Lab Results  Component Value Date   TSH 1.00 04/02/2018    She is followed by Dr. Corliss Parish for CKD 3/4. She has stopped taking goody powders and trying to quit smoking. She drinks 3-4 bottles of water daily.  Lab Results  Component Value Date   GFRNONAA 22 (L) 04/02/2018   Patient is on Vitamin D supplement and at goal at recent check:    Lab Results  Component Value Date   VD25OH 57 04/02/2018       Current Medications:  Current Outpatient Medications on File Prior to Visit  Medication Sig  . clopidogrel (PLAVIX) 75 MG tablet Take 1 tablet Daily to Prevent Blood Clots  . furosemide (LASIX) 40 MG tablet TAKE 1 TABLET BY MOUTH 2  TIMES DAILY FOR BLOOD  PRESSURE AND FLUID  . labetalol (NORMODYNE) 300 MG tablet TAKE 1 TABLET BY MOUTH TWO  TIMES DAILY  . levETIRAcetam (KEPPRA) 500 MG tablet TAKE 1 TABLET BY MOUTH  TWICE DAILY FOR SEIZURES  . levothyroxine (SYNTHROID) 75 MCG tablet Take 1 tablet daily on an empty stomach with only water for 30 minutes & no Antacid meds, Calcium or  Magnesium for 4 hours & avoid Biotin  . losartan (COZAAR) 50 MG tablet Take 50 mg by mouth daily.  . rosuvastatin (CRESTOR) 40 MG tablet TAKE ONE TABLET BY MOUTH DAILY FOR CHOLESTEROL.  . Vitamin D, Ergocalciferol, (DRISDOL) 50000 UNITS CAPS capsule TAKE 1 CAPSULE DAILY FOR SEVERE VITAMIN D DEFICIENCY SECONDARY TO MALABSORPTION (Patient taking differently: TAKE 1 CAPSULE EVERY OTHER DAY FOR SEVERE VITAMIN D DEFICIENCY SECONDARY TO MALABSORPTION)  . varenicline (CHANTIX CONTINUING MONTH PAK) 1 MG tablet Take 1 tablet (1 mg total) by mouth 2 (two) times daily. (Patient not taking: Reported on 10/13/2018)   No current facility-administered medications on file prior to visit.      Allergies:  Allergies  Allergen Reactions  . Ace Inhibitors Cough  . Penicillins   . Prednisone     Dysphoria     Medical History:  Past Medical History:  Diagnosis Date  . CAD (coronary artery disease)   . CKD (chronic kidney disease) stage 3, GFR 30-59 ml/min (HCC)   . History of basal cell carcinoma excision 12/20/2017  . Hyperlipidemia LDL goal <70   . Hypertension   . Hypothyroidism   .  Tobacco abuse   . Vitamin D deficiency    Family history- Reviewed and unchanged Social history- Reviewed and unchanged   Review of Systems:  Review of Systems  Constitutional: Negative for malaise/fatigue and weight loss.  HENT: Negative for hearing loss and tinnitus.   Eyes: Negative for blurred vision and double vision.  Respiratory: Negative for cough, shortness of breath and wheezing.   Cardiovascular: Negative for chest pain, palpitations, orthopnea, claudication and leg swelling.  Gastrointestinal: Negative for abdominal pain, blood in stool, constipation, diarrhea, heartburn, melena, nausea and vomiting.  Genitourinary: Negative.   Musculoskeletal: Negative for joint pain and myalgias.  Skin: Negative for rash.  Neurological: Negative for dizziness, tingling, sensory change, weakness and headaches.   Endo/Heme/Allergies: Negative for polydipsia.  Psychiatric/Behavioral: Negative.   All other systems reviewed and are negative.    Physical Exam: BP 140/88   Pulse 69   Temp (!) 96.4 F (35.8 C)   Ht 5\' 7"  (1.702 m)   Wt 177 lb 3.2 oz (80.4 kg)   SpO2 97%   BMI 27.75 kg/m  Wt Readings from Last 3 Encounters:  10/13/18 177 lb 3.2 oz (80.4 kg)  04/02/18 184 lb 9.6 oz (83.7 kg)  12/23/17 194 lb (88 kg)   General Appearance: Well nourished, in no apparent distress. Eyes: PERRLA, EOMs, conjunctiva no swelling or erythema Sinuses: No Frontal/maxillary tenderness ENT/Mouth: Ext aud canals clear, TMs without erythema, bulging. No erythema, swelling, or exudate on post pharynx.  Tonsils not swollen or erythematous. Hearing normal.  Neck: Supple, thyroid normal.  Respiratory: Respiratory effort normal, BS equal bilaterally without rales, rhonchi, wheezing or stridor.  Cardio: RRR with harsh 3/6 decreshendo systolic murmur. Thready peripheral pulses with nonpitting edema to bilateral ankles/lower legs, chronic and unchanged.  Abdomen: Soft, + BS.  Non tender, no guarding, rebound, hernias, masses. Lymphatics: Non tender without lymphadenopathy.  Musculoskeletal: Full ROM, 5/5 strength, Normal gait Skin: Warm, dry; multiple pearly growths to face and neck without erythema or ulceration.  Neuro: Cranial nerves intact. No cerebellar symptoms. Sensation intact.  Psych: Awake and oriented X 3, normal affect, Insight and Judgment appropriate.    Izora Ribas, NP 4:06 PM [facility]oles[facility]

## 2018-10-13 ENCOUNTER — Other Ambulatory Visit: Payer: Self-pay

## 2018-10-13 ENCOUNTER — Ambulatory Visit (INDEPENDENT_AMBULATORY_CARE_PROVIDER_SITE_OTHER): Payer: 59 | Admitting: Adult Health

## 2018-10-13 ENCOUNTER — Encounter: Payer: Self-pay | Admitting: Adult Health

## 2018-10-13 VITALS — BP 140/88 | HR 69 | Temp 96.4°F | Ht 67.0 in | Wt 177.2 lb

## 2018-10-13 DIAGNOSIS — E039 Hypothyroidism, unspecified: Secondary | ICD-10-CM

## 2018-10-13 DIAGNOSIS — J432 Centrilobular emphysema: Secondary | ICD-10-CM | POA: Diagnosis not present

## 2018-10-13 DIAGNOSIS — Z79899 Other long term (current) drug therapy: Secondary | ICD-10-CM

## 2018-10-13 DIAGNOSIS — G40909 Epilepsy, unspecified, not intractable, without status epilepticus: Secondary | ICD-10-CM

## 2018-10-13 DIAGNOSIS — D649 Anemia, unspecified: Secondary | ICD-10-CM

## 2018-10-13 DIAGNOSIS — F17200 Nicotine dependence, unspecified, uncomplicated: Secondary | ICD-10-CM

## 2018-10-13 DIAGNOSIS — E782 Mixed hyperlipidemia: Secondary | ICD-10-CM

## 2018-10-13 DIAGNOSIS — I2510 Atherosclerotic heart disease of native coronary artery without angina pectoris: Secondary | ICD-10-CM | POA: Diagnosis not present

## 2018-10-13 DIAGNOSIS — E559 Vitamin D deficiency, unspecified: Secondary | ICD-10-CM

## 2018-10-13 DIAGNOSIS — I10 Essential (primary) hypertension: Secondary | ICD-10-CM

## 2018-10-13 DIAGNOSIS — N184 Chronic kidney disease, stage 4 (severe): Secondary | ICD-10-CM

## 2018-10-13 DIAGNOSIS — I739 Peripheral vascular disease, unspecified: Secondary | ICD-10-CM

## 2018-10-13 DIAGNOSIS — R7303 Prediabetes: Secondary | ICD-10-CM

## 2018-10-13 DIAGNOSIS — E663 Overweight: Secondary | ICD-10-CM

## 2018-10-13 MED ORDER — BUPROPION HCL ER (XL) 150 MG PO TB24: 150.0000 mg | ORAL_TABLET | ORAL | 0 refills | Status: DC

## 2018-10-13 NOTE — Patient Instructions (Addendum)
SMOKING CESSATION  [employer]  [phone] for more information or for a free program for smoking cessation help.   You can call QUIT SMART 1-800-QUIT-NOW for free nicotine patches or replacement therapy- if they are out- keep calling  [facility] Can call for smoking cessation classes, [phone]  If you have a smart phone, please look up Smoke Free app, this will help you stay on track and give you information about money you have saved, life that you have gained back and a ton of more information.     ADVANTAGES OF QUITTING SMOKING  Within 20 minutes, blood pressure decreases. Your pulse is at normal level.  After 8 hours, carbon monoxide levels in the blood return to normal. Your oxygen level increases.  After 24 hours, the chance of having a heart attack starts to decrease. Your breath, hair, and body stop smelling like smoke.  After 48 hours, damaged nerve endings begin to recover. Your sense of taste and smell improve.  After 72 hours, the body is virtually free of nicotine. Your bronchial tubes relax and breathing becomes easier.  After 2 to 12 weeks, lungs can hold more air. Exercise becomes easier and circulation improves.  After 1 year, the risk of coronary heart disease is cut in half.  After 5 years, the risk of stroke falls to the same as a nonsmoker.  After 10 years, the risk of lung cancer is cut in half and the risk of other cancers decreases significantly.  After 15 years, the risk of coronary heart disease drops, usually to the level of a nonsmoker.  You will have extra money to spend on things other than cigarettes.      Bupropion sustained-release tablets (smoking cessation) What is this medicine? BUPROPION (byoo PROE pee on) is used to help people quit smoking. This medicine may be used for other purposes; ask your health care provider or pharmacist if you have questions. COMMON BRAND NAME(S): Buproban, Zyban What  should I tell my health care provider before I take this medicine? They need to know if you have any of these conditions:  an eating disorder, such as anorexia or bulimia  bipolar disorder or psychosis  diabetes or high blood sugar, treated with medication  glaucoma  head injury or brain tumor  heart disease, previous heart attack, or irregular heart beat  high blood pressure  kidney or liver disease  seizures  suicidal thoughts or a previous suicide attempt  Tourette's syndrome  weight loss  an unusual or allergic reaction to bupropion, other medicines, foods, dyes, or preservatives  breast-feeding  pregnant or trying to become pregnant How should I use this medicine? Take this medicine by mouth with a glass of water. Follow the directions on the prescription label. You can take it with or without food. If it upsets your stomach, take it with food. Do not cut, crush or chew this medicine. Take your medicine at regular intervals. If you take this medicine more than once a day, take your second dose at least 8 hours after you take your first dose. To limit difficulty in sleeping, avoid taking this medicine at bedtime. Do not take your medicine more often than directed. Do not stop taking this medicine suddenly except upon the advice of your doctor. Stopping this medicine too quickly may cause serious side effects. A special MedGuide will be given to you by the pharmacist with each prescription and refill. Be sure to read this information carefully each time.  Talk to your pediatrician regarding the use of this medicine in children. Special care may be needed. Overdosage: If you think you have taken too much of this medicine contact a poison control center or emergency room at once. NOTE: This medicine is only for you. Do not share this medicine with others. What if I miss a dose? If you miss a dose, skip the missed dose and take your next tablet at the regular time. There should  be at least 8 hours between doses. Do not take double or extra doses. What may interact with this medicine? Do not take this medicine with any of the following medications:  linezolid  MAOIs like Azilect, Carbex, Eldepryl, Marplan, Nardil, and Parnate  methylene blue (injected into a vein)  other medicines that contain bupropion like Wellbutrin This medicine may also interact with the following medications:  alcohol  certain medicines for anxiety or sleep  certain medicines for blood pressure like metoprolol, propranolol  certain medicines for depression or psychotic disturbances  certain medicines for HIV or AIDS like efavirenz, lopinavir, nelfinavir, ritonavir  certain medicines for irregular heart beat like propafenone, flecainide  certain medicines for Parkinson's disease like amantadine, levodopa  certain medicines for seizures like carbamazepine, phenytoin, phenobarbital  cimetidine  clopidogrel  cyclophosphamide  digoxin  furazolidone  isoniazid  nicotine  orphenadrine  procarbazine  steroid medicines like prednisone or cortisone  stimulant medicines for attention disorders, weight loss, or to stay awake  tamoxifen  theophylline  thiotepa  ticlopidine  tramadol  warfarin This list may not describe all possible interactions. Give your health care provider a list of all the medicines, herbs, non-prescription drugs, or dietary supplements you use. Also tell them if you smoke, drink alcohol, or use illegal drugs. Some items may interact with your medicine. What should I watch for while using this medicine? Visit your doctor or healthcare provider for regular checks on your progress. This medicine should be used together with a patient support program. It is important to participate in a behavioral program, counseling, or other support program that is recommended by your healthcare provider. This medicine may cause serious skin reactions. They can  happen weeks to months after starting the medicine. Contact your healthcare provider right away if you notice fevers or flu-like symptoms with a rash. The rash may be red or purple and then turn into blisters or peeling of the skin. Or, you might notice a red rash with swelling of the face, lips or lymph nodes in your neck or under your arms. Patients and their families should watch out for new or worsening thoughts of suicide or depression. Also watch out for sudden changes in feelings such as feeling anxious, agitated, panicky, irritable, hostile, aggressive, impulsive, severely restless, overly excited and hyperactive, or not being able to sleep. If this happens, especially at the beginning of treatment or after a change in dose, call your healthcare provider. Avoid alcoholic drinks while taking this medicine. Drinking excessive alcoholic beverages, using sleeping or anxiety medicines, or quickly stopping the use of these agents while taking this medicine may increase your risk for a seizure. Do not drive or use heavy machinery until you know how this medicine affects you. This medicine can impair your ability to perform these tasks. Do not take this medicine close to bedtime. It may prevent you from sleeping. Your mouth may get dry. Chewing sugarless gum or sucking hard candy, and drinking plenty of water may help. Contact your doctor if the problem does  not go away or is severe. Do not use nicotine patches or chewing gum without the advice of your doctor or healthcare provider while taking this medicine. You may need to have your blood pressure taken regularly if your doctor recommends that you use both nicotine and this medicine together. What side effects may I notice from receiving this medicine? Side effects that you should report to your doctor or health care professional as soon as possible:  allergic reactions like skin rash, itching or hives, swelling of the face, lips, or tongue  breathing  problems  changes in vision  confusion  elevated mood, decreased need for sleep, racing thoughts, impulsive behavior  fast or irregular heartbeat  hallucinations, loss of contact with reality  increased blood pressure  rash, fever, and swollen lymph nodes  redness, blistering, peeling, or loosening of the skin, including inside the mouth  seizures  suicidal thoughts or other mood changes  unusually weak or tired  vomiting Side effects that usually do not require medical attention (report to your doctor or health care professional if they continue or are bothersome):  constipation  headache  loss of appetite  nausea  tremors  weight loss This list may not describe all possible side effects. Call your doctor for medical advice about side effects. You may report side effects to FDA at 1-800-FDA-1088. Where should I keep my medicine? Keep out of the reach of children. Store at room temperature between 20 and 25 degrees C (68 and 77 degrees F). Protect from light. Keep container tightly closed. Throw away any unused medicine after the expiration date. NOTE: This sheet is a summary. It may not cover all possible information. If you have questions about this medicine, talk to your doctor, pharmacist, or health care provider.  2020 Elsevier/Gold Standard (2018-05-08 13:59:09)     Intermittent Claudication Intermittent claudication is pain in one or both legs that occurs when walking or exercising and goes away when resting. Intermittent claudication is a symptom of peripheral arterial disease (PAD). This condition is commonly treated with rest, medicine, and healthy lifestyle changes. If medical management does not improve symptoms, surgery can be done to restore blood flow (revascularization) to the affected leg. What are the causes?  This condition is caused by buildup of fatty material (plaque) within the major arteries in the body (atherosclerosis). Plaque makes  arteries stiff and narrow, which prevents enough blood from reaching the leg muscles. Pain occurs when you walk or exercise because your muscles need (but cannot get) more blood when you are moving and exercising. What increases the risk? The following factors may make you more likely to develop this condition:  A family history of atherosclerosis.  A personal history of stroke or heart disease.  Older age.  Being inactive (sedentary lifestyle).  Being overweight.  Smoking cigarettes.  Having another health condition such as: ? Diabetes. ? High blood pressure. ? High cholesterol. What are the signs or symptoms? Symptoms of this condition may first develop in the lower leg, and then they may spread to the thigh, hip, buttock, or the back of the lower leg (calf) over time. Symptoms may include:  Aches or pains.  Cramps.  A feeling of tightness, weakness, or heaviness.  A wound on the lower leg or foot that heals poorly or does not heal. How is this diagnosed? This condition may be diagnosed based on:  Your symptoms.  Your medical history.  Tests, such as: ? Blood tests. ? Arterial duplex ultrasound. This  test uses images of blood vessels and surrounding organs to evaluate blood flow within arteries. ? Angiogram. In this procedure, dye is injected into arteries and then X-rays are taken. ? Magnetic resonance angiogram (MRA). In this procedure, strong magnets and radio waves are used instead of X-rays to create images of blood vessels and blood flow. ? CT angiogram (CTA). In this procedure, a large X-ray machine called a CT scanner takes detailed pictures of blood vessels that have been injected with dye. ? Ankle-brachial index (ABI) test. This procedure measures blood pressure in the leg during exercise and at rest. ? Exercise test. For this test, you will walk on a treadmill while tests are done (such as the ABI test) to evaluate how this condition affects your ability to  walk or exercise. How is this treated? Treatment for this condition may involve treatment for the underlying cause, such as treatment for high blood pressure, high cholesterol, or diabetes. Treatment may include:  Lifestyle changes such as: ? Starting a supervised or home-based exercise program. ? Losing weight. ? Quitting smoking.  Medicines to help restore blood flow through your legs.  Blood vessel surgery (angioplasty) to restore blood flow around the blocked vessel. This is also known as endovascular therapy (EVT). This is only done if your intermittent claudication is caused by severe peripheral artery disease, a condition in which blood flow is severely or totally restricted by the narrowing of the arteries. Follow these instructions at home: Lifestyle   Maintain a healthy weight.  Eat a diet that is low in saturated fats and calories. Consider working with a diet and nutrition specialist (dietitian) to help you make healthy food choices.  Do not use any products that contain nicotine or tobacco, such as cigarettes and e-cigarettes. If you need help quitting, ask your health care provider.  If your health care provider recommended an exercise program for you, follow it as directed. Your exercise program may involve: ? Walking 3 or more times a week. ? Walking until you have certain symptoms of intermittent claudication. ? Resting until symptoms go away. ? Gradually increasing your walking time to about 50 minutes a day. General instructions  Work with your health care provider to manage any other health conditions you may have, including diabetes, high blood pressure, or high cholesterol.  Take over-the-counter and prescription medicines only as told by your health care provider.  Keep all follow-up visits as told by your health care provider. This is important. Contact a health care provider if:  Your pain does not go away with rest.  You have sores on your legs that do  not heal or have a bad smell or pus coming from them.  Your condition gets worse or does not get better with treatment. Get help right away if:  You have chest pain.  You have difficulty breathing.  You develop arm weakness.  You have trouble speaking.  Your face begins to droop.  Your foot or leg is cold or it changes color.  Your foot or leg becomes numb. These symptoms may represent a serious problem that is an emergency. Do not wait to see if the symptoms will go away. Get medical help right away. Call your local emergency services (911 in the [place] Do not drive yourself to the hospital.  Summary  Intermittent claudication is pain in one or both legs that occurs when walking or exercising and goes away when resting.  This condition is caused by buildup of fatty material (plaque)  within the major arteries in the body (atherosclerosis). Plaque makes arteries stiff and narrow, which prevents enough blood from reaching the leg muscles.  Intermittent claudication can be treated with medicine and lifestyle changes. If medical treatment fails, surgery can be done to help return blood flow to the affected area.  Make sure you work with your health care provider to manage any other health conditions you may have, including diabetes, high blood pressure, or high cholesterol. This information is not intended to replace advice given to you by your health care provider. Make sure you discuss any questions you have with your health care provider. Document Released: 12/16/2003 Document Revised: 01/25/2017 Document Reviewed: 03/15/2016 Elsevier Patient Education  2020 [employer].

## 2018-10-14 ENCOUNTER — Other Ambulatory Visit: Payer: Self-pay | Admitting: Adult Health

## 2018-10-14 DIAGNOSIS — E039 Hypothyroidism, unspecified: Secondary | ICD-10-CM

## 2018-10-14 MED ORDER — LEVOTHYROXINE SODIUM 75 MCG PO TABS: ORAL_TABLET | ORAL | 1 refills | Status: DC

## 2018-10-15 LAB — LIPID PANEL
Cholesterol: 135 mg/dL (ref <200)
HDL: 38 mg/dL — ABNORMAL LOW (ref >=50)
LDL Cholesterol (Calc): 78 mg/dL (calc)
Non-HDL Cholesterol (Calc): 97 mg/dL (calc) (ref <130)
Total CHOL/HDL Ratio: 3.6 (calc) (ref <5.0)
Triglycerides: 108 mg/dL (ref <150)

## 2018-10-15 LAB — CBC WITH DIFFERENTIAL/PLATELET
Absolute Monocytes: 586 cells/uL (ref 200–950)
Basophils Absolute: 58 cells/uL (ref 0–200)
Basophils Relative: 1 %
Eosinophils Absolute: 157 cells/uL (ref 15–500)
Eosinophils Relative: 2.7 %
HCT: 35.2 % (ref 35.0–45.0)
Hemoglobin: 12 g/dL (ref 11.7–15.5)
Lymphs Abs: 1670 cells/uL (ref 850–3900)
MCH: 31.7 pg (ref 27.0–33.0)
MCHC: 34.1 g/dL (ref 32.0–36.0)
MCV: 93.1 fL (ref 80.0–100.0)
MPV: 10.5 fL (ref 7.5–12.5)
Monocytes Relative: 10.1 %
Neutro Abs: 3329 cells/uL (ref 1500–7800)
Neutrophils Relative %: 57.4 %
Platelets: 196 10*3/uL (ref 140–400)
RBC: 3.78 10*6/uL — ABNORMAL LOW (ref 3.80–5.10)
RDW: 12.6 % (ref 11.0–15.0)
Total Lymphocyte: 28.8 %
WBC: 5.8 10*3/uL (ref 3.8–10.8)

## 2018-10-15 LAB — COMPLETE METABOLIC PANEL WITH GFR
AG Ratio: 1.9 (calc) (ref 1.0–2.5)
ALT: 10 U/L (ref 6–29)
AST: 16 U/L (ref 10–35)
Albumin: 4.1 g/dL (ref 3.6–5.1)
Alkaline phosphatase (APISO): 58 U/L (ref 37–153)
BUN/Creatinine Ratio: 15 (calc) (ref 6–22)
BUN: 30 mg/dL — ABNORMAL HIGH (ref 7–25)
CO2: 29 mmol/L (ref 20–32)
Calcium: 9.6 mg/dL (ref 8.6–10.4)
Chloride: 98 mmol/L (ref 98–110)
Creat: 2 mg/dL — ABNORMAL HIGH (ref 0.50–0.99)
GFR, Est African American: 31 mL/min/{1.73_m2} — ABNORMAL LOW (ref >=60)
GFR, Est Non African American: 26 mL/min/{1.73_m2} — ABNORMAL LOW (ref >=60)
Globulin: 2.2 g/dL (calc) (ref 1.9–3.7)
Glucose, Bld: 92 mg/dL (ref 65–99)
Potassium: 4.9 mmol/L (ref 3.5–5.3)
Sodium: 135 mmol/L (ref 135–146)
Total Bilirubin: 0.3 mg/dL (ref 0.2–1.2)
Total Protein: 6.3 g/dL (ref 6.1–8.1)

## 2018-10-15 LAB — TSH: TSH: 0.24 mIU/L — ABNORMAL LOW (ref 0.40–4.50)

## 2018-10-15 LAB — MAGNESIUM: Magnesium: 2 mg/dL (ref 1.5–2.5)

## 2018-10-15 LAB — LEVETIRACETAM LEVEL: Keppra (Levetiracetam): 32.9 ug/mL (ref 12.0–46.0)

## 2018-10-30 ENCOUNTER — Other Ambulatory Visit: Payer: Self-pay | Admitting: Adult Health

## 2018-10-30 DIAGNOSIS — R569 Unspecified convulsions: Secondary | ICD-10-CM

## 2018-11-21 ENCOUNTER — Other Ambulatory Visit: Payer: Self-pay | Admitting: Internal Medicine

## 2018-11-21 DIAGNOSIS — E785 Hyperlipidemia, unspecified: Secondary | ICD-10-CM

## 2018-11-24 ENCOUNTER — Other Ambulatory Visit: Payer: Self-pay

## 2018-11-25 ENCOUNTER — Other Ambulatory Visit: Payer: 59

## 2018-11-25 ENCOUNTER — Other Ambulatory Visit: Payer: Self-pay | Admitting: *Deleted

## 2018-11-25 ENCOUNTER — Other Ambulatory Visit: Payer: Self-pay

## 2018-11-25 DIAGNOSIS — E039 Hypothyroidism, unspecified: Secondary | ICD-10-CM

## 2018-11-25 DIAGNOSIS — E785 Hyperlipidemia, unspecified: Secondary | ICD-10-CM

## 2018-11-25 LAB — TSH: TSH: 0.4 mIU/L (ref 0.40–4.50)

## 2018-11-25 MED ORDER — BUPROPION HCL ER (XL) 150 MG PO TB24: 150.0000 mg | ORAL_TABLET | ORAL | 0 refills | Status: DC

## 2018-11-25 MED ORDER — ROSUVASTATIN CALCIUM 40 MG PO TABS: ORAL_TABLET | ORAL | 1 refills | Status: DC

## 2018-11-28 ENCOUNTER — Other Ambulatory Visit: Payer: Self-pay | Admitting: Internal Medicine

## 2018-11-28 DIAGNOSIS — E785 Hyperlipidemia, unspecified: Secondary | ICD-10-CM

## 2018-11-28 MED ORDER — ROSUVASTATIN CALCIUM 40 MG PO TABS: ORAL_TABLET | ORAL | 1 refills | Status: DC

## 2018-11-28 MED ORDER — BUPROPION HCL ER (XL) 150 MG PO TB24: ORAL_TABLET | ORAL | 1 refills | Status: DC

## 2018-12-04 ENCOUNTER — Other Ambulatory Visit: Payer: Self-pay | Admitting: Internal Medicine

## 2018-12-04 MED ORDER — BUPROPION HCL ER (XL) 150 MG PO TB24: ORAL_TABLET | ORAL | 1 refills | Status: DC

## 2018-12-12 ENCOUNTER — Other Ambulatory Visit: Payer: Self-pay | Admitting: Physician Assistant

## 2018-12-12 DIAGNOSIS — R569 Unspecified convulsions: Secondary | ICD-10-CM

## 2018-12-12 MED ORDER — LEVETIRACETAM 500 MG PO TABS: ORAL_TABLET | ORAL | 0 refills | Status: DC

## 2019-01-01 ENCOUNTER — Other Ambulatory Visit: Payer: Self-pay | Admitting: Internal Medicine

## 2019-01-01 ENCOUNTER — Other Ambulatory Visit: Payer: Self-pay

## 2019-01-01 DIAGNOSIS — E039 Hypothyroidism, unspecified: Secondary | ICD-10-CM

## 2019-01-02 ENCOUNTER — Other Ambulatory Visit: Payer: Self-pay | Admitting: Internal Medicine

## 2019-01-02 DIAGNOSIS — E039 Hypothyroidism, unspecified: Secondary | ICD-10-CM

## 2019-01-02 MED ORDER — LEVOTHYROXINE SODIUM 75 MCG PO TABS: ORAL_TABLET | ORAL | 1 refills | Status: DC

## 2019-01-09 NOTE — Progress Notes (Signed)
FOLLOW UP  Assessment and Plan:   Essential hypertension - continue medications, DASH diet, exercise and monitor at home. Call if greater than 130/80.  -     CBC with Diff -     COMPLETE METABOLIC PANEL WITH GFR -     TSH  Hyperlipidemia -     Lipid Profile check lipids- goal less than 70 with cardiac history and current smoking decrease fatty foods increase activity.   Hypothyroidism, unspecified type Hypothyroidism-check TSH level, continue medications the same, reminded to take on an empty stomach 30-59mins before food.   SMOKER Smoking cessation-  instruction/counseling given, counseled patient on the dangers of tobacco use, advised patient to stop smoking, and reviewed strategies to maximize success, patient not ready to quit at this time. Will consider chantix/quitting after the holidays. Since her husbands lung cancer she has cut back and is only smoking outside, she is hopeful with the cold weather she will cut back significantly but her motivation to completely quit is lacking at this time.   Peripheral vascular disease (HCC) -     VAS [place] ABI WITH/WO TBI Clear vasculopath, smoker, claudication with 5 blocks, will get ABI Continue plavix, walking  Medication management -     Magnesium  Centrilobular emphysema (HCC) Advised to quit smoking, long disucssion.   Aortic atherosclerosis (HCC) Control blood pressure, cholesterol, glucose, increase exercise.   Seizure disorder ([place]) Controlled, continue medications ADDENDUM: Did not check level this visit, may need to adjust with kidney function, check next OV.   CKD (chronic kidney disease) stage 4, GFR 15-29 ml/min (HCC) -     COMPLETE METABOLIC PANEL WITH GFR Will send labs to nephrology Patient is on lasix and losartan still at this time  Vitamin D deficiency -     Vitamin D (25 hydroxy)  Claudication of left lower extremity (HCC) -     VAS [place] ABI WITH/WO TBI Decreased pulses, claudications with 5 blocks,  continue plavix, walking, and statin- will try to schedule ABI again  Screen for colon cancer -     Cologuard Long discussion about screening- willing to do cologuard   Continue diet and meds as discussed. Further disposition pending results of labs. Discussed med's effects and SE's.   Over 30 minutes of exam, counseling, chart review, and critical decision making was performed.   Future Appointments  Date Time Provider [place]  01/12/2019  3:30 PM Vicie Mutters, PA-C [id number] None    ----------------------------------------------------------------------------------------------------------------------  HPI 58 y.o. female  presents for 3 month follow up on hypertension, cholesterol, glucose management, weight and vitamin D deficiency.   She has ASCVD with bypass in 2002, followed annually by cardiology; hx of ruptured cerebral aneurysm in 2005 with repeated repair in 2012, hx of seizure order on keppra - no recent seizures - last in 2008-  Followed by [facility].   She has hx of PVD, has reported claudication symptoms of LLE ongoing for several months, cramping at 5 blocks, better with stopping, ABI was ordered but has been rescheduled in light of covid 19. She is on statin and plavix. Has pedal bike at her desk.   she currently continues to smoke 1 pack a day; discussed risks associated with smoking, patient is not ready to quit; she tried quitting with her husband (he had lung CA), Louie Casa but has not been sucessful. She is smoking outside only and is hoping with the colder weather she will slow down but does not want to discuss quitting  until after the holidays.   She had benign appearing CT lung cancer screen on 08/22/2018; she does have mild centrolobular emphysema by imaging,   BMI is Body mass index is 27 kg/m., she states she is not trying to lose weight but has been making changes that has contributed to weight loss. She has been watching portions, being  more conscious, avoiding morning biscuit, breads, soda, etc  She has pedal machine at home under work desk. She is drinking more water.  MGM 06/2017 she is due.  OVERDUE COLONOSCOPY no symptoms Wt Readings from Last 3 Encounters:  01/12/19 172 lb 6.4 oz (78.2 kg)  10/13/18 177 lb 3.2 oz (80.4 kg)  04/02/18 184 lb 9.6 oz (83.7 kg)   Her blood pressure has been controlled at home, today their BP is BP: 140/80   She does workout occasionally - has seated cycling machine that she does while at her desk. She denies chest pain, shortness of breath, dizziness.    She is on cholesterol medication (crestor 40 mg daily) and denies myalgias. Her LDL cholesterol is not at goal of less than 70. The cholesterol last visit was:   Lab Results  Component Value Date   CHOL 135 10/13/2018   HDL 38 (L) 10/13/2018   LDLCALC 78 10/13/2018   TRIG 108 10/13/2018   CHOLHDL 3.6 10/13/2018    She has been working on diet and exercise for glucose management (intermittent hx of prediabetes), and denies foot ulcerations, increased appetite, nausea, paresthesia of the feet, polydipsia, polyuria, visual disturbances, vomiting and weight loss. Last A1C in the office was:  Lab Results  Component Value Date   HGBA1C 5.6 04/02/2018   She is on thyroid medication. Her medication was not changed last visit. 75 mcg daily.  Lab Results  Component Value Date   TSH 0.40 11/25/2018    She is followed by Dr. Corliss Parish for CKD 3/4. She has stopped taking goody powders and trying to quit smoking. She drinks 3-4 bottles of water daily. She is on losartan and lasix for BP.  Lab Results  Component Value Date   GFRNONAA 26 (L) 10/13/2018   Patient is on Vitamin D supplement and at goal at recent check:    Lab Results  Component Value Date   VD25OH 57 04/02/2018       Current Medications:  Current Outpatient Medications on File Prior to Visit  Medication Sig  . buPROPion (WELLBUTRIN XL) 150 MG 24 hr tablet Take  1 tablet Daily for Mood , Focus & Concentration  . clopidogrel (PLAVIX) 75 MG tablet Take 1 tablet Daily to Prevent Blood Clots  . furosemide (LASIX) 40 MG tablet TAKE 1 TABLET BY MOUTH 2  TIMES DAILY FOR BLOOD  PRESSURE AND FLUID  . labetalol (NORMODYNE) 300 MG tablet TAKE 1 TABLET BY MOUTH TWO  TIMES DAILY  . levETIRAcetam (KEPPRA) 500 MG tablet TAKE 1 TABLET BY MOUTH  TWICE DAILY FOR SEIZURES  . levothyroxine (SYNTHROID) 75 MCG tablet Take 1 tablet daily on an empty stomach with only water for 30 minutes & no Antacid meds, Calcium or Magnesium for 4 hours & avoid Biotin  . losartan (COZAAR) 50 MG tablet Take 50 mg by mouth daily.  . rosuvastatin (CRESTOR) 40 MG tablet Take 1 tablet Daily for Cholesterol  . varenicline (CHANTIX CONTINUING MONTH PAK) 1 MG tablet Take 1 tablet (1 mg total) by mouth 2 (two) times daily.  . Vitamin D, Ergocalciferol, (DRISDOL) 50000 UNITS CAPS capsule TAKE  1 CAPSULE DAILY FOR SEVERE VITAMIN D DEFICIENCY SECONDARY TO MALABSORPTION (Patient taking differently: TAKE 1 CAPSULE EVERY OTHER DAY FOR SEVERE VITAMIN D DEFICIENCY SECONDARY TO MALABSORPTION)   No current facility-administered medications on file prior to visit.      Allergies:  Allergies  Allergen Reactions  . Ace Inhibitors Cough  . Penicillins   . Prednisone     Dysphoria     Medical History:  Past Medical History:  Diagnosis Date  . CAD (coronary artery disease)   . CKD (chronic kidney disease) stage 3, GFR 30-59 ml/min   . History of basal cell carcinoma excision 12/20/2017  . Hyperlipidemia LDL goal <70   . Hypertension   . Hypothyroidism   . Tobacco abuse   . Vitamin D deficiency    Family history- Reviewed and unchanged Social history- Reviewed and unchanged   Review of Systems:  Review of Systems  Constitutional: Negative for malaise/fatigue and weight loss.  HENT: Negative for hearing loss and tinnitus.   Eyes: Negative for blurred vision and double vision.  Respiratory:  Negative for cough, shortness of breath and wheezing.   Cardiovascular: Negative for chest pain, palpitations, orthopnea, claudication and leg swelling.  Gastrointestinal: Negative for abdominal pain, blood in stool, constipation, diarrhea, heartburn, melena, nausea and vomiting.  Genitourinary: Negative.   Musculoskeletal: Negative for joint pain and myalgias.  Skin: Negative for rash.  Neurological: Negative for dizziness, tingling, sensory change, weakness and headaches.  Endo/Heme/Allergies: Negative for polydipsia.  Psychiatric/Behavioral: Negative.   All other systems reviewed and are negative.    Physical Exam: BP 140/80   Pulse 68   Temp (!) 97.4 F (36.3 C)   Wt 172 lb 6.4 oz (78.2 kg)   SpO2 99%   BMI 27.00 kg/m  Wt Readings from Last 3 Encounters:  01/12/19 172 lb 6.4 oz (78.2 kg)  10/13/18 177 lb 3.2 oz (80.4 kg)  04/02/18 184 lb 9.6 oz (83.7 kg)   General Appearance: Well nourished, in no apparent distress. Eyes: PERRLA, EOMs, conjunctiva no swelling or erythema Sinuses: No Frontal/maxillary tenderness ENT/Mouth: Ext aud canals clear, TMs without erythema, bulging. No erythema, swelling, or exudate on post pharynx.  Tonsils not swollen or erythematous. Hearing normal.  Neck: Supple, thyroid normal.  Respiratory: Respiratory effort normal, BS equal bilaterally without rales, rhonchi, wheezing or stridor.  Cardio: RRR with harsh 3/6 decreshendo systolic murmur. Thready peripheral pulses with nonpitting edema to bilateral ankles/lower legs, chronic and unchanged.  Abdomen: Soft, + BS.  Non tender, no guarding, rebound, hernias, masses. Lymphatics: Non tender without lymphadenopathy.  Musculoskeletal: Full ROM, 5/5 strength, Normal gait Skin: Warm, dry; multiple pearly growths to face and neck without erythema or ulceration.  Neuro: Cranial nerves intact. No cerebellar symptoms. Sensation intact.  Psych: Awake and oriented X 3, normal affect, Insight and Judgment  appropriate.    Vicie Mutters, PA-C 3:09 PM [facility]

## 2019-01-12 ENCOUNTER — Ambulatory Visit (INDEPENDENT_AMBULATORY_CARE_PROVIDER_SITE_OTHER): Payer: 59 | Admitting: Physician Assistant

## 2019-01-12 ENCOUNTER — Other Ambulatory Visit: Payer: Self-pay

## 2019-01-12 ENCOUNTER — Encounter: Payer: Self-pay | Admitting: Physician Assistant

## 2019-01-12 VITALS — BP 140/80 | HR 68 | Temp 97.4°F | Wt 172.4 lb

## 2019-01-12 DIAGNOSIS — E039 Hypothyroidism, unspecified: Secondary | ICD-10-CM | POA: Diagnosis not present

## 2019-01-12 DIAGNOSIS — I739 Peripheral vascular disease, unspecified: Secondary | ICD-10-CM

## 2019-01-12 DIAGNOSIS — E782 Mixed hyperlipidemia: Secondary | ICD-10-CM

## 2019-01-12 DIAGNOSIS — Z79899 Other long term (current) drug therapy: Secondary | ICD-10-CM

## 2019-01-12 DIAGNOSIS — I10 Essential (primary) hypertension: Secondary | ICD-10-CM | POA: Diagnosis not present

## 2019-01-12 DIAGNOSIS — E559 Vitamin D deficiency, unspecified: Secondary | ICD-10-CM

## 2019-01-12 DIAGNOSIS — F17200 Nicotine dependence, unspecified, uncomplicated: Secondary | ICD-10-CM | POA: Diagnosis not present

## 2019-01-12 DIAGNOSIS — I700 Atherosclerosis of aorta: Secondary | ICD-10-CM

## 2019-01-12 DIAGNOSIS — G40909 Epilepsy, unspecified, not intractable, without status epilepticus: Secondary | ICD-10-CM

## 2019-01-12 DIAGNOSIS — Z1211 Encounter for screening for malignant neoplasm of colon: Secondary | ICD-10-CM

## 2019-01-12 DIAGNOSIS — J432 Centrilobular emphysema: Secondary | ICD-10-CM

## 2019-01-12 DIAGNOSIS — N184 Chronic kidney disease, stage 4 (severe): Secondary | ICD-10-CM

## 2019-01-12 NOTE — Patient Instructions (Addendum)
HOW TO SCHEDULE A MAMMOGRAM  [facility]  7 a.m.-6:30 p.m., Monday 7 a.m.-5 p.m., Tuesday-Friday Schedule an appointment by calling [phone].    Testing/Procedures: HOW TO TAKE YOUR BLOOD PRESSURE:  Rest 5 minutes before taking your blood pressure.  Don't smoke or drink caffeinated beverages for at least 30 minutes before.  Take your blood pressure before (not after) you eat.  Sit comfortably with your back supported and both feet on the floor (dont cross your legs).  Elevate your arm to heart level on a table or a desk.  Use the proper sized cuff. It should fit smoothly and snugly around your bare upper arm. There should be enough room to slip a fingertip under the cuff. The bottom edge of the cuff should be 1 inch above the crease of the elbow.  Will set you up for an ABI  COLOGUARD INFORMATION   Cologuard is an easy to use noninvasive colon cancer screening test based on the latest advances in stool DNA science.   Colon cancer is 3rd most diagnosed cancer and 2nd leading cause of death in both men and women 60 years of age and older despite being one of the most preventable and treatable cancers if found early.  4 of out 5 people diagnosed with colon cancer have NO prior family history.  When caught EARLY 90% of colon cancer is curable.   More than 92% of cologuard patients have NO out of pocket cost for screening however only your insurer can confirm how Cologuard would be covered for you. Cologuard has a team of specialist that can help you contact your insurer and ask the right questions. Please call [phone] so they can help.   You will receive a short call from [place] support center at [employer], when you receive a call they will say they are from [place],  to confirm your mailing address and give you more information.  When they calll you, it will appear on the caller ID as "[employer]" or in  some cases only this number will appear, [phone].   [employer] [employer] will ship your collection kit directly to you. You will collect a single stool sample in the privacy of your own home, no special preparation required. You will return the kit via [place] pre-paid shipping or pick-up, in the same box it arrived in. Then I will contact you to discuss your results after I receive them from the laboratory.   If you have any questions or concerns, C[facility] Customer Support Specialist are available 24 hours a day, 7 days a week at [phone] or go to [email or web address].    SMOKING CESSATION  American cancer society  [phone] for more information or for a free program for smoking cessation help.   You can call QUIT SMART 1-800-QUIT-NOW for free nicotine patches or replacement therapy- if they are out- keep calling  [facility] [place] [facility] Can call for smoking cessation classes, [phone]  If you have a smart phone, please look up Smoke Free app, this will help you stay on track and give you information about money you have saved, life that you have gained back and a ton of more information.     ADVANTAGES OF QUITTING SMOKING  Within 20 minutes, blood pressure decreases. Your pulse is at normal level.  After 8 hours, carbon monoxide levels in the blood return to normal. Your oxygen level increases.  After 24 hours, the chance of having  a heart attack starts to decrease. Your breath, hair, and body stop smelling like smoke. °· After 48 hours, damaged nerve endings begin to recover. Your sense of taste and smell improve. °· After 72 hours, the body is virtually free of nicotine. Your bronchial tubes relax and breathing becomes easier. °· After 2 to 12 weeks, lungs can hold more air. Exercise becomes easier and circulation improves. °· After 1 year, the risk of coronary heart disease is cut in half. °· After 5 years, the risk of stroke falls to the same as a  nonsmoker. °· After 10 years, the risk of lung cancer is cut in half and the risk of other cancers decreases significantly. °· After 15 years, the risk of coronary heart disease drops, usually to the level of a nonsmoker. °· You will have extra money to spend on things other than cigarettes. ° ° ° °Peripheral Vascular Disease °Peripheral vascular disease (PVD) is a disease of the blood vessels. A simple term for PVD is poor circulation. In most cases, PVD narrows the blood vessels that carry blood from your heart to the rest of your body. This can result in a decreased supply of blood to your arms, legs, and internal organs, like your stomach or kidneys. However, it most often affects a person’s lower legs and feet. °There are two types of PVD. °· Organic PVD. This is the more common type. It is caused by damage to the structure of blood vessels. °· Functional PVD. This is caused by conditions that make blood vessels contract and tighten (spasm). °Without treatment, PVD tends to get worse over time. °PVD can also lead to acute limb ischemia. This is when an arm or leg suddenly has trouble getting enough blood. This is a medical emergency. °What are the causes? ° °Each type of PVD has many different causes. The most common cause of PVD is buildup of a fatty material (plaque) inside your arteries (atherosclerosis). Small amounts of plaque can break off from the walls of the blood vessels and become lodged in a smaller artery. This blocks blood flow and can cause acute limb ischemia. °Other common causes of PVD include: °· Blood clots that form inside of blood vessels. °· Injuries to blood vessels. °· Diseases that cause inflammation of blood vessels or cause blood vessel spasms. °· Health behaviors and health history that increase your risk of developing PVD. °What increases the risk? °You are more likely to develop this condition if: °· You have a family history of PVD. °· You have certain medical conditions,  including: °? High cholesterol. °? Diabetes. °? High blood pressure (hypertension). °? Coronary heart disease. °? Past problems with blood clots. °? Past injury, such as burns or a broken bone. These may have damaged blood vessels in your limbs. °? Buerger disease. This is caused by inflamed blood vessels in your hands and feet. °? Some forms of arthritis. °? Rare birth defects that affect the arteries in your legs. °? Kidney disease. °· You use tobacco or smoke. °· You do not get enough exercise. °· You are obese. °· You are age 50 or older. °What are the signs or symptoms? °This condition may cause different symptoms. Your symptoms depend on what part of your body is not getting enough blood. Some common signs and symptoms include: °· Cramps in your lower legs. This may be a symptom of poor leg circulation (claudication). °· Pain and weakness in your legs. This happens while you are physically active but   goes away when you rest (intermittent claudication). °· Leg pain when at rest. °· Leg numbness, tingling, or weakness. °· Coldness in a leg or foot, especially when compared with the other leg. °· Skin or hair changes. These can include: °? Hair loss. °? Shiny skin. °? Pale or bluish skin. °? Thick toenails. °· Inability to get or maintain an erection (erectile dysfunction). °· Fatigue. °People with PVD are more likely to develop ulcers and sores on their toes, feet, or legs. These may take longer than normal to heal. °How is this diagnosed? °This condition is diagnosed based on: °· Your signs and symptoms. °· A physical exam and your medical history. °· Other tests to find out what is causing your PVD and to determine its severity. Tests may include: °? Blood pressure recordings from your arms and legs and measurements of the strength of your pulses (pulse volume recordings). °? Imaging studies using sound waves to take pictures of the blood flow through your blood vessels (Doppler ultrasound). °? Injecting a dye  into your blood vessels before having imaging studies using: °§ X-rays (angiogram or arteriogram). °§ Computer-generated X-rays (CT angiogram). °§ A powerful electromagnetic field and a computer (magnetic resonance angiogram or MRA). °How is this treated? °Treatment for PVD depends on the cause of your condition and how severe your symptoms are. It also depends on your age. Underlying causes need to be treated and controlled. These include long-term (chronic) conditions, such as diabetes, high cholesterol, and high blood pressure. Treatment includes: °· Lifestyle changes, such as: °? Quitting smoking. °? Exercising regularly. °? Following a low-fat, low-cholesterol diet. °· Taking medicines, such as: °? Blood thinners to prevent blood clots. °? Medicines to improve blood flow. °? Medicines to improve your blood cholesterol levels. °· Surgical procedures, such as: °? A procedure that uses an inflated balloon to open a blocked artery and improve blood flow (angioplasty). °? A procedure to put in a wire mesh tube to keep a blocked artery open (stent implant). °? Surgery to reroute blood flow around a blocked artery (peripheral bypass surgery). °? Surgery to remove dead tissue from an infected wound on the affected limb. °? Amputation. This is surgical removal of the affected limb. It may be necessary in cases of acute limb ischemia where there has been no improvement through medical or surgical treatments. °Follow these instructions at home: °Lifestyle °· Do not use any products that contain nicotine or tobacco, such as cigarettes and e-cigarettes. If you need help quitting, ask your health care provider. °· Lose weight if you are overweight, and maintain a healthy weight as discussed by your health care provider. °· Eat a diet that is low in fat and cholesterol. If you need help, ask your health care provider. °· Exercise regularly. Ask your health care provider to suggest some good activities for you. °General  instructions °· Take over-the-counter and prescription medicines only as told by your health care provider. °· Take good care of your feet: °? Wear comfortable shoes that fit well. °? Check your feet often for any cuts or sores. °· Keep all follow-up visits as told by your health care provider. This is important. °Contact a health care provider if: °· You have cramps in your legs while walking. °· You have leg pain when you are at rest. °· You have coldness in a leg or foot. °· Your skin changes. °· You have erectile dysfunction. °· You have cuts or sores on your feet that are not healing. °  Get help right away if:  Your arm or leg turns cold, numb, and blue.  Your arms or legs become red, warm, swollen, painful, or numb.  You have chest pain or trouble breathing.  You suddenly have weakness in your face, arm, or leg.  You become very confused or lose the ability to speak.  You suddenly have a very bad headache or lose your vision. Summary  Peripheral vascular disease (PVD) is a disease of the blood vessels.  In most cases, PVD narrows the blood vessels that carry blood from your heart to the rest of your body.  PVD may cause different symptoms. Your symptoms depend on what part of your body is not getting enough blood.  Treatment for PVD depends on the cause of your condition and how severe your symptoms are. This information is not intended to replace advice given to you by your health care provider. Make sure you discuss any questions you have with your health care provider. Document Released: 03/22/2004 Document Revised: 01/25/2017 Document Reviewed: 03/22/2016 Elsevier Patient Education  2020 [employer].

## 2019-01-13 ENCOUNTER — Other Ambulatory Visit: Payer: Self-pay | Admitting: Physician Assistant

## 2019-01-13 LAB — CBC WITH DIFFERENTIAL/PLATELET
Absolute Monocytes: 551 cells/uL (ref 200–950)
Basophils Absolute: 58 cells/uL (ref 0–200)
Basophils Relative: 1 %
Eosinophils Absolute: 232 cells/uL (ref 15–500)
Eosinophils Relative: 4 %
HCT: 36.2 % (ref 35.0–45.0)
Hemoglobin: 12.1 g/dL (ref 11.7–15.5)
Lymphs Abs: 1543 cells/uL (ref 850–3900)
MCH: 31.2 pg (ref 27.0–33.0)
MCHC: 33.4 g/dL (ref 32.0–36.0)
MCV: 93.3 fL (ref 80.0–100.0)
MPV: 10 fL (ref 7.5–12.5)
Monocytes Relative: 9.5 %
Neutro Abs: 3416 cells/uL (ref 1500–7800)
Neutrophils Relative %: 58.9 %
Platelets: 191 10*3/uL (ref 140–400)
RBC: 3.88 10*6/uL (ref 3.80–5.10)
RDW: 12.8 % (ref 11.0–15.0)
Total Lymphocyte: 26.6 %
WBC: 5.8 10*3/uL (ref 3.8–10.8)

## 2019-01-13 LAB — COMPLETE METABOLIC PANEL WITH GFR
AG Ratio: 1.8 (calc) (ref 1.0–2.5)
ALT: 11 U/L (ref 6–29)
AST: 17 U/L (ref 10–35)
Albumin: 4.2 g/dL (ref 3.6–5.1)
Alkaline phosphatase (APISO): 71 U/L (ref 37–153)
BUN/Creatinine Ratio: 12 (calc) (ref 6–22)
BUN: 37 mg/dL — ABNORMAL HIGH (ref 7–25)
CO2: 27 mmol/L (ref 20–32)
Calcium: 9.3 mg/dL (ref 8.6–10.4)
Chloride: 100 mmol/L (ref 98–110)
Creat: 3.04 mg/dL — ABNORMAL HIGH (ref 0.50–0.99)
GFR, Est African American: 18 mL/min/{1.73_m2} — ABNORMAL LOW (ref >=60)
GFR, Est Non African American: 16 mL/min/{1.73_m2} — ABNORMAL LOW (ref >=60)
Globulin: 2.4 g/dL (calc) (ref 1.9–3.7)
Glucose, Bld: 110 mg/dL — ABNORMAL HIGH (ref 65–99)
Potassium: 4.6 mmol/L (ref 3.5–5.3)
Sodium: 136 mmol/L (ref 135–146)
Total Bilirubin: 0.3 mg/dL (ref 0.2–1.2)
Total Protein: 6.6 g/dL (ref 6.1–8.1)

## 2019-01-13 LAB — LIPID PANEL
Cholesterol: 136 mg/dL (ref <200)
HDL: 38 mg/dL — ABNORMAL LOW (ref >=50)
LDL Cholesterol (Calc): 74 mg/dL (calc)
Non-HDL Cholesterol (Calc): 98 mg/dL (calc) (ref <130)
Total CHOL/HDL Ratio: 3.6 (calc) (ref <5.0)
Triglycerides: 164 mg/dL — ABNORMAL HIGH (ref <150)

## 2019-01-13 LAB — VITAMIN D 25 HYDROXY (VIT D DEFICIENCY, FRACTURES): Vit D, 25-Hydroxy: 97 ng/mL (ref 30–100)

## 2019-01-13 LAB — MAGNESIUM: Magnesium: 2.2 mg/dL (ref 1.5–2.5)

## 2019-01-13 LAB — TSH: TSH: 3.46 mIU/L (ref 0.40–4.50)

## 2019-01-13 MED ORDER — EZETIMIBE 10 MG PO TABS: 10.0000 mg | ORAL_TABLET | Freq: Every day | ORAL | 1 refills | Status: DC

## 2019-02-11 ENCOUNTER — Inpatient Hospital Stay (HOSPITAL_COMMUNITY): Admission: RE | Admit: 2019-02-11 | Payer: 59 | Source: Ambulatory Visit

## 2019-02-25 ENCOUNTER — Ambulatory Visit (HOSPITAL_COMMUNITY): Payer: 59

## 2019-03-05 ENCOUNTER — Encounter (HOSPITAL_COMMUNITY): Payer: 59

## 2019-03-20 ENCOUNTER — Other Ambulatory Visit: Payer: Self-pay

## 2019-03-20 DIAGNOSIS — R569 Unspecified convulsions: Secondary | ICD-10-CM

## 2019-03-20 MED ORDER — LEVETIRACETAM 500 MG PO TABS: ORAL_TABLET | ORAL | 2 refills | Status: DC

## 2019-03-24 ENCOUNTER — Other Ambulatory Visit: Payer: Self-pay | Admitting: Internal Medicine

## 2019-03-24 DIAGNOSIS — E785 Hyperlipidemia, unspecified: Secondary | ICD-10-CM

## 2019-03-30 ENCOUNTER — Encounter: Payer: Self-pay | Admitting: Adult Health

## 2019-04-01 ENCOUNTER — Ambulatory Visit (HOSPITAL_COMMUNITY): Payer: 59

## 2019-04-17 NOTE — Progress Notes (Signed)
Complete Physical  Assessment and Plan:  Diagnoses and all orders for this visit:  Encounter for routine physical examination with abnormal findings Declines PAP, she agrees to CT lung cancer screening, cologuard, she will schedule mammogram   Atherosclerosis of aorta Per CT 07/2018 Control blood pressure, cholesterol, glucose, increase exercise.   Essential hypertension Continue medication Monitor blood pressure at home; call if consistently over 130/80 Continue to recommend DASH diet.   Reminder to go to the ER if any CP, SOB, nausea, dizziness, severe HA, changes vision/speech, left arm numbness and tingling and jaw pain.  Atherosclerosis of native coronary artery of native heart without angina pectoris Encouraged to schedule overdue cardiology follow up Quit smoking - patient unwilling Continue plavix EKG ordered today is abnormal but stable from previous; denies cardiac sx  Hx of CVA Recommended she quit smoking, continue plavix, statin.   Hypothyroidism, unspecified type continue medications the same reminded to take on an empty stomach 30-40mins before food.  -     TSH  Seizure disorder ([place]) Continue keppra; recommended she quit smoking but unwilling at this time. Monitor levels.  Refer back to [facility] neuro if any changes; last in 2012 per patient   Stage 4 CKD  Followed by nephrology - [facility] -     CMP/GFR -     Urinalysis, Complete (81001) -     Microalbumin / creatinine urine ratio  Hyperlipidemia Continue medications LDL goal <70 Continue to recommend low cholesterol diet and exercise.  -     Lipid panel  SMOKER Continues to smoke 1 ppd-  instruction/counseling given, counseled patient on the dangers of tobacco use, advised patient to stop smoking, and reviewed strategies to maximize success, Patient is not ready to quit - Discussed low dose screening CT and will proceed with patient's permission, due 07/2019  Emphysema ([place]) Per  imaging, recommended stop smoking Denies sx; monitor   Vitamin D deficiency Continue supplementation -     VITAMIN D 25 Hydroxy (Vit-D Deficiency, Fractures)  Screening for diabetes Discussed disease and risks Discussed diet/exercise, weight management  -     Hemoglobin A1c  Medication management -     CBC with Differential/Platelet -     CMP/GFR -     Magnesium   Overweight Long discussion about weight loss, diet, and exercise Recommended diet heavy in fruits and veggies and low in animal meats, cheeses, and dairy products, appropriate calorie intake Discussed appropriate weight for height  Follow up at next visit  Normocytic anemia ? Anemia r/t chronic CKD; follows with nephrology; check CBC, iron, B12 -     Vitamin B12 -     Iron,Total/Total Iron Binding Cap  Screening for colorectal cancer Colonoscopy- patient declines a colonoscopy even though the risks and benefits were discussed at length. Colon cancer is 3rd most diagnosed cancer and 2nd leading cause of death in both men and women 15 years of age and older. Patient understands the risk of cancer and death with declining the test however they are willing to do cologuard screening instead. They understand that this is not as sensitive or specific as a colonoscopy and they are still recommended to get a colonoscopy. She has cologuard test from last year, encouraged to place in bathroom, will call frequently to follow up  Peripheral vascular disease ([place]) Continue aggressive treatment of cholesterol Continue plavix Continue cardiology follow up   PVD/Diminished pulses/LLE claudication - ABI ordered, patient declines at this time, plans to get in upcoming year  Control blood pressure, cholesterol, glucose, increase exercise, on plavix   Atypical nevus of R lower back  Erythematous/inflamed, recommend removal and pathology Can schedule here with Dr. Jerilynn Mages vs dermatology   Poor follow through  Reviewed overdue tests with  patient; she agrees to complete flu vaccine at pharmacy, schedule derm for above lesion, complete cologuard, schedule mammogram Will continue close follow up q23m visits, call to verify progress in a few weeks   Discussed med's effects and SE's. Screening labs and tests as requested with regular follow-up as recommended. Over 40 minutes of exam, counseling, chart review, and complex, high level critical decision making was performed this visit.   Future Appointments  Date Time Provider [place]  08/03/2019  2:30 PM Liane Comber, NP [facility] None  11/04/2019  2:30 PM Liane Comber, NP [facility] None  04/19/2020  3:00 PM Liane Comber, NP [facility] None     HPI  59 y.o. Caucasian married female,  - presents for a complete physical and follow up for has Hypothyroidism; Hyperlipidemia; SMOKER; Essential hypertension; CAD (coronary artery disease); History of CVA (cerebrovascular accident); Peripheral vascular disease ([place]); CKD (chronic kidney disease) stage 4, GFR 15-29 ml/min ([place]); Seizure disorder ([place]); Vitamin D deficiency; Poor compliance; Medication management; Overweight (BMI 25.0-29.9); Normocytic anemia; Claudication of left lower extremity ([place]); Emphysema of lung ([place]); Aortic atherosclerosis ([place]); Cholelithiases; and Atypical nevus of right lower back on their problem list.   Married, works from home for [employer]  No concerns today.   hx of ruptured cerebral aneurysm in 2005 with repeated repair in 2012, hx of seizure order on keppra - no recent seizures - last in 2008-  Formerly followed by [facility]rology, now following keppra levels here in office.   She is newly followed by Nephrology Dr. Corliss Parish due to recent progression to CKD IV.  she currently continues to smoke 0.5 pack a day; discussed risks associated with smoking at length, patient is not ready to quit. She states she is very aware of risks and has quit in the past, but is not  at a point where she is ready to do so. She is aware that she needs to make this decision for her health. She is on chantix, down from 1.5-2 packs to 0.5 pack daily.   She has a history of smoking, between the age of 18-75, has quit within the last 24 years or is current smoker. She has greater than a 30+ pack/smoking history and has no symptoms at this time. She qualifies for low dose CT scan for lung cancer screening. She had initial screening 07/2018 which showed emphysematous changes but no concerning nodules or lymphadenopathy.   BMI is Body mass index is 27.06 kg/m., she has been working on diet and exercise. She has a pedal exerciser at her desk and uses daily - makes small healthy choices.  Wt Readings from Last 3 Encounters:  04/20/19 172 lb 12.8 oz (78.4 kg)  01/12/19 172 lb 6.4 oz (78.2 kg)  10/13/18 177 lb 3.2 oz (80.4 kg)   She has ASCVD with bypass in 2002, followed annually by cardiology Dr. Acie Fredrickson last 2019, overdue She has hx of PVD, on statin and plavix, has been reporting aching in LLE after 5 blocks, ABI has been ordered repeatedly without follow through by the patient, too busy with husband at this time She has atherosclerosis of the aorta per CT 07/2018 Her blood pressure has been controlled at home, today their BP is BP: 124/72 She  does workout- cycler at her desk.   She denies chest pain, shortness of breath, dizziness.   She is on cholesterol medication (rosuvastatin 20 mg daily) and denies myalgias. Her cholesterol is at goal. The cholesterol last visit was:   Lab Results  Component Value Date   CHOL 136 01/12/2019   HDL 38 (L) 01/12/2019   LDLCALC 74 01/12/2019   TRIG 164 (H) 01/12/2019   CHOLHDL 3.6 01/12/2019   She has been working on diet and exercise for glucose management, she is not on bASA (on plavix), she is on ACE/ARB and denies increased appetite, nausea, paresthesia of the feet, polydipsia, polyuria, visual disturbances and vomiting. Last A1C in the  office was:  Lab Results  Component Value Date   HGBA1C 5.6 04/02/2018   She has CKD IV and is following with Dr. Corliss Parish:  Lab Results  Component Value Date   GFRNONAA 16 (L) 01/12/2019   She is on thyroid medication. Her medication was not changed last visit.  Lab Results  Component Value Date   TSH 3.46 01/12/2019   Patient is on Vitamin D supplement, taking 50000 IU every other day  Lab Results  Component Value Date   VD25OH 97 01/12/2019       Current Medications:  Current Outpatient Medications on File Prior to Visit  Medication Sig Dispense Refill  . buPROPion (WELLBUTRIN XL) 150 MG 24 hr tablet Take 1 tablet Daily for Mood , Focus & Concentration 90 tablet 1  . clopidogrel (PLAVIX) 75 MG tablet Take 1 tablet Daily to Prevent Blood Clots 90 tablet 3  . ezetimibe (ZETIA) 10 MG tablet Take 1 tablet (10 mg total) by mouth daily. 90 tablet 1  . furosemide (LASIX) 40 MG tablet TAKE 1 TABLET BY MOUTH 2  TIMES DAILY FOR BLOOD  PRESSURE AND FLUID 180 tablet 0  . labetalol (NORMODYNE) 300 MG tablet TAKE 1 TABLET BY MOUTH TWO  TIMES DAILY 180 tablet 0  . levETIRAcetam (KEPPRA) 500 MG tablet TAKE 1 TABLET BY MOUTH  TWICE DAILY FOR SEIZURES 180 tablet 2  . levothyroxine (SYNTHROID) 75 MCG tablet Take 1 tablet daily on an empty stomach with only water for 30 minutes & no Antacid meds, Calcium or Magnesium for 4 hours & avoid Biotin 90 tablet 1  . losartan (COZAAR) 50 MG tablet Take 50 mg by mouth daily.    . rosuvastatin (CRESTOR) 40 MG tablet Take 1 tablet Daily for Cholesterol 90 tablet 3  . Vitamin D, Ergocalciferol, (DRISDOL) 50000 UNITS CAPS capsule TAKE 1 CAPSULE DAILY FOR SEVERE VITAMIN D DEFICIENCY SECONDARY TO MALABSORPTION (Patient not taking: Reported on 04/20/2019) 90 capsule 1   No current facility-administered medications on file prior to visit.   Allergies:  Allergies  Allergen Reactions  . Ace Inhibitors Cough  . Penicillins   . Prednisone      Dysphoria   Medical History:  She has Hypothyroidism; Hyperlipidemia; SMOKER; Essential hypertension; CAD (coronary artery disease); History of CVA (cerebrovascular accident); Peripheral vascular disease ([place]); CKD (chronic kidney disease) stage 4, GFR 15-29 ml/min ([place]); Seizure disorder ([place]); Vitamin D deficiency; Poor compliance; Medication management; Overweight (BMI 25.0-29.9); Normocytic anemia; Claudication of left lower extremity ([place]); Emphysema of lung ([place]); Aortic atherosclerosis ([place]); Cholelithiases; and Atypical nevus of right lower back on their problem list. Health Maintenance:   Immunization History  Administered Date(s) Administered  . Influenza Inj Mdck Quad With Preservative 01/31/2017, 12/23/2017  . Influenza Split 12/02/2014  . Influenza,inj,quad, With Preservative 12/20/2015  .  PPD Test 10/09/2013, 12/02/2014  . Td 10/24/2006, 01/31/2017   Tetanus: 2018 Flu vaccine: 2019, out if office  Pap: Remote - DECLINES - discussed risks, if patient changes her mind she may request at regular visit MGM: 05/2017, patient states will schedule  DEXA: N/A  Colonoscopy: DUE - declines - agrees to cologuard but poor follow through - patient states never got, will reorder CXR: 2015 CT chest: 07/2018, emphysema, cholelithiasis, aortic atherosclerosis  Last Dental Exam: Remote - needs top teeth out - she is planning to see an oral surgeon Last Eye Exam: Annually, last visit 2020, had cataracts out, doing well    Patient Care Team: [name], MD as PCP - General (Internal Medicine) Nahser, Wonda Cheng, MD as PCP - Cardiology (Cardiology) Corliss Parish, MD as Consulting Physician (Nephrology)  Surgical History:  She has a past surgical history that includes Coronary artery bypass graft (2002); Embolization (2005); Cerebral aneurysm repair (Right, 2012); Cerebral aneurysm repair (Right, 2005); and Cataract extraction, bilateral (Bilateral, 2020). Family History:   Herfamily history includes Brain cancer in her father; Heart attack in her maternal grandfather; Heart attack (age of onset: 63) in her brother; Heart attack (age of onset: 19) in her mother; Heart disease in her brother and mother; Heart disease (age of onset: 63) in her brother; Leukemia (age of onset: 39) in her sister. Social History:  She reports that she has been smoking cigarettes. She started smoking about 46 years ago. She has a 45.00 pack-year smoking history. She has never used smokeless tobacco. She reports that she does not drink alcohol or use drugs.  Review of Systems: Review of Systems  Constitutional: Negative for malaise/fatigue and weight loss.  HENT: Negative for hearing loss and tinnitus.   Eyes: Negative for blurred vision and double vision.  Respiratory: Negative for cough, shortness of breath and wheezing.   Cardiovascular: Positive for claudication (aching, cramping in L leg with ambulation). Negative for chest pain, palpitations, orthopnea and leg swelling.  Gastrointestinal: Negative for abdominal pain, blood in stool, constipation, diarrhea, heartburn, melena, nausea and vomiting.  Genitourinary: Negative.   Musculoskeletal: Negative for joint pain and myalgias.  Skin: Negative for rash.  Neurological: Negative for dizziness, tingling (soles of feet intermittently ), sensory change, weakness and headaches.  Endo/Heme/Allergies: Positive for environmental allergies (She reports mild symptoms - uses OTC medications rarely as needed). Negative for polydipsia.  Psychiatric/Behavioral: Negative.   All other systems reviewed and are negative.   Physical Exam: Estimated body mass index is 27.06 kg/m as calculated from the following:   Height as of this encounter: 5\' 7"  (1.702 m).   Weight as of this encounter: 172 lb 12.8 oz (78.4 kg). BP 124/72   Pulse 64   Temp (!) 97.5 F (36.4 C)   Ht 5\' 7"  (1.702 m)   Wt 172 lb 12.8 oz (78.4 kg)   SpO2 91%   BMI 27.06  kg/m  General Appearance: Well nourished, in no apparent distress.  Eyes: PERRLA, EOMs, conjunctiva no swelling or erythema.  Sinuses: No Frontal/maxillary tenderness  ENT/Mouth: Ext aud canals clear, normal light reflex with TMs without erythema, bulging. Extremely poor dentition - broken/missing/black teeth. NO ulcers, glands normal to palpation. No erythema, swelling, or exudate on post pharynx. Tonsils not swollen or erythematous. Hearing normal.  Neck: Supple, thyroid normal. No bruits  Respiratory: Respiratory effort normal, BS present but somewhat coarse throughout; no wheezing, stridor.  Cardio: RRR without murmurs, rubs or gallops. Thready symmetrical peripheral pulses with mild  non-pitting edema  Chest: symmetric, with normal excursions and percussion.  Breasts: Declines today  Abdomen: Soft, nontender, no guarding, rebound, hernias, masses, or organomegaly.  Lymphatics: Non tender without lymphadenopathy.  Genitourinary: Patient declines Musculoskeletal: Full ROM all peripheral extremities,5/5 strength, and normal gait.  Skin: Warm, dry; she has approx 1.5 cm x 1 cm raised, erythematous, irregular, flaking skin lesion to R lower back Neuro: Cranial nerves intact, reflexes equal bilaterally. Normal muscle tone, no cerebellar symptoms. Sensation intact.  Psych: Awake and oriented X 3, normal affect, Insight and Judgment appropriate.   EKG: Sinus brady, evidence old inferior MI, Does have appearance of ST elevation in lead III, aVF, stable from previous, Suggestive of LVH, NSCPT, reviewed with Dr. Olga Coaster 5:25 PM [facility]

## 2019-04-20 ENCOUNTER — Other Ambulatory Visit: Payer: Self-pay

## 2019-04-20 ENCOUNTER — Encounter: Payer: Self-pay | Admitting: Adult Health

## 2019-04-20 ENCOUNTER — Ambulatory Visit (INDEPENDENT_AMBULATORY_CARE_PROVIDER_SITE_OTHER): Payer: 59 | Admitting: Adult Health

## 2019-04-20 VITALS — BP 124/72 | HR 64 | Temp 97.5°F | Ht 67.0 in | Wt 172.8 lb

## 2019-04-20 DIAGNOSIS — I700 Atherosclerosis of aorta: Secondary | ICD-10-CM

## 2019-04-20 DIAGNOSIS — Z136 Encounter for screening for cardiovascular disorders: Secondary | ICD-10-CM | POA: Diagnosis not present

## 2019-04-20 DIAGNOSIS — Z0001 Encounter for general adult medical examination with abnormal findings: Secondary | ICD-10-CM

## 2019-04-20 DIAGNOSIS — K8020 Calculus of gallbladder without cholecystitis without obstruction: Secondary | ICD-10-CM

## 2019-04-20 DIAGNOSIS — E663 Overweight: Secondary | ICD-10-CM

## 2019-04-20 DIAGNOSIS — Z8673 Personal history of transient ischemic attack (TIA), and cerebral infarction without residual deficits: Secondary | ICD-10-CM

## 2019-04-20 DIAGNOSIS — D225 Melanocytic nevi of trunk: Secondary | ICD-10-CM | POA: Insufficient documentation

## 2019-04-20 DIAGNOSIS — I739 Peripheral vascular disease, unspecified: Secondary | ICD-10-CM

## 2019-04-20 DIAGNOSIS — Z122 Encounter for screening for malignant neoplasm of respiratory organs: Secondary | ICD-10-CM

## 2019-04-20 DIAGNOSIS — J432 Centrilobular emphysema: Secondary | ICD-10-CM

## 2019-04-20 DIAGNOSIS — E559 Vitamin D deficiency, unspecified: Secondary | ICD-10-CM

## 2019-04-20 DIAGNOSIS — E039 Hypothyroidism, unspecified: Secondary | ICD-10-CM

## 2019-04-20 DIAGNOSIS — Z1211 Encounter for screening for malignant neoplasm of colon: Secondary | ICD-10-CM

## 2019-04-20 DIAGNOSIS — Z131 Encounter for screening for diabetes mellitus: Secondary | ICD-10-CM

## 2019-04-20 DIAGNOSIS — D649 Anemia, unspecified: Secondary | ICD-10-CM

## 2019-04-20 DIAGNOSIS — F17200 Nicotine dependence, unspecified, uncomplicated: Secondary | ICD-10-CM

## 2019-04-20 DIAGNOSIS — I2510 Atherosclerotic heart disease of native coronary artery without angina pectoris: Secondary | ICD-10-CM

## 2019-04-20 DIAGNOSIS — N184 Chronic kidney disease, stage 4 (severe): Secondary | ICD-10-CM

## 2019-04-20 DIAGNOSIS — E782 Mixed hyperlipidemia: Secondary | ICD-10-CM

## 2019-04-20 DIAGNOSIS — Z0000 Encounter for general adult medical examination without abnormal findings: Secondary | ICD-10-CM

## 2019-04-20 DIAGNOSIS — I10 Essential (primary) hypertension: Secondary | ICD-10-CM

## 2019-04-20 DIAGNOSIS — Z79899 Other long term (current) drug therapy: Secondary | ICD-10-CM

## 2019-04-20 DIAGNOSIS — G40909 Epilepsy, unspecified, not intractable, without status epilepticus: Secondary | ICD-10-CM

## 2019-04-20 NOTE — Patient Instructions (Addendum)
Olivia Walsh , Thank you for taking time to come for your Annual Wellness Visit. I appreciate your ongoing commitment to your health goals. Please review the following plan we discussed and let me know if I can assist you in the future.   These are the goals we discussed: Goals    . DIET - INCREASE WATER INTAKE     Aim for 4-5 bottles daily     . LDL CALC < 70    . Quit Smoking    . Weight (lb) < 160 lb (72.6 kg)       This is a list of the screening recommended for you and due dates:  Health Maintenance  Topic Date Due  . Cologuard (Stool DNA test)  06/18/2008  . Pap Smear  04/20/2019*  . Flu Shot  04/21/2019*  . Mammogram  06/27/2019  . Tetanus Vaccine  02/01/2027  .  Hepatitis C: One time screening is recommended by Center for Disease Control  (CDC) for  adults born from [birth year] through 1967.   Completed  . HIV Screening  Completed  *Topic was postponed. The date shown is not the original due date.    HOW TO SCHEDULE A MAMMOGRAM  [facility]  7 a.m.-6:30 p.m., Monday 7 a.m.-5 p.m., Tuesday-Friday Schedule an appointment by calling [phone].  [facility] Schedule an appointment by calling [phone].    COLOGUARD INFORMATION   Cologuard is an easy to use noninvasive colon cancer screening test based on the latest advances in stool DNA science.   Colon cancer is 3rd most diagnosed cancer and 2nd leading cause of death in both men and women 61 years of age and older despite being one of the most preventable and treatable cancers if found early.  4 of out 5 people diagnosed with colon cancer have NO prior family history.  When caught EARLY 90% of colon cancer is curable.   More than 92% of cologuard patients have NO out of pocket cost for screening however only your insurer can confirm how Cologuard would be covered for you. Cologuard has a team of specialist that can help you contact your insurer and ask the right questions. Please  call [phone] so they can help.   You will receive a short call from [place] support center at [employer], when you receive a call they will say they are from [place],  to confirm your mailing address and give you more information.  When they calll you, it will appear on the caller ID as "[employer]" or in some cases only this number will appear, [phone].   [employer] will ship your collection kit directly to you. You will collect a single stool sample in the privacy of your own home, no special preparation required. You will return the kit via [place] pre-paid shipping or pick-up, in the same box it arrived in. Then I will contact you to discuss your results after I receive them from the laboratory.   If you have any questions or concerns, Colog[facility]ard Customer Support Specialist are available 24 hours a day, 7 days a week at [phone] or go to [email or web address].         SMOKING CESSATION  American cancer society  [phone] for more information or for a free program for smoking cessation help.   You can call QUIT SMART 1-800-QUIT-NOW for free nicotine patches or replacement therapy- if they are out- keep calling  [facility]  Can call for smoking cessation classes, [phone]  If you have a smart phone, please look up Smoke Free app, this will help you stay on track and give you information about money you have saved, life that you have gained back and a ton of more information.     ADVANTAGES OF QUITTING SMOKING  Within 20 minutes, blood pressure decreases. Your pulse is at normal level.  After 8 hours, carbon monoxide levels in the blood return to normal. Your oxygen level increases.  After 24 hours, the chance of having a heart attack starts to decrease. Your breath, hair, and body stop smelling like smoke.  After 48 hours, damaged nerve endings begin to recover. Your sense of taste and smell  improve.  After 72 hours, the body is virtually free of nicotine. Your bronchial tubes relax and breathing becomes easier.  After 2 to 12 weeks, lungs can hold more air. Exercise becomes easier and circulation improves.  After 1 year, the risk of coronary heart disease is cut in half.  After 5 years, the risk of stroke falls to the same as a nonsmoker.  After 10 years, the risk of lung cancer is cut in half and the risk of other cancers decreases significantly.  After 15 years, the risk of coronary heart disease drops, usually to the level of a nonsmoker.  You will have extra money to spend on things other than cigarettes.

## 2019-04-21 ENCOUNTER — Other Ambulatory Visit: Payer: Self-pay | Admitting: Adult Health

## 2019-04-21 DIAGNOSIS — D649 Anemia, unspecified: Secondary | ICD-10-CM

## 2019-04-23 LAB — URINALYSIS, ROUTINE W REFLEX MICROSCOPIC
Bacteria, UA: NONE SEEN /HPF
Bilirubin Urine: NEGATIVE
Glucose, UA: NEGATIVE
Hgb urine dipstick: NEGATIVE
Hyaline Cast: NONE SEEN /LPF
Ketones, ur: NEGATIVE
Nitrite: NEGATIVE
Protein, ur: NEGATIVE
RBC / HPF: NONE SEEN /HPF (ref 0–2)
Specific Gravity, Urine: 1.009 (ref 1.001–1.03)
pH: 5.5 (ref 5.0–8.0)

## 2019-04-23 LAB — CBC WITH DIFFERENTIAL/PLATELET
Absolute Monocytes: 508 cells/uL (ref 200–950)
Basophils Absolute: 37 cells/uL (ref 0–200)
Basophils Relative: 0.6 %
Eosinophils Absolute: 260 cells/uL (ref 15–500)
Eosinophils Relative: 4.2 %
HCT: 33.2 % — ABNORMAL LOW (ref 35.0–45.0)
Hemoglobin: 11 g/dL — ABNORMAL LOW (ref 11.7–15.5)
Lymphs Abs: 1705 cells/uL (ref 850–3900)
MCH: 31.4 pg (ref 27.0–33.0)
MCHC: 33.1 g/dL (ref 32.0–36.0)
MCV: 94.9 fL (ref 80.0–100.0)
MPV: 10.3 fL (ref 7.5–12.5)
Monocytes Relative: 8.2 %
Neutro Abs: 3689 cells/uL (ref 1500–7800)
Neutrophils Relative %: 59.5 %
Platelets: 206 10*3/uL (ref 140–400)
RBC: 3.5 10*6/uL — ABNORMAL LOW (ref 3.80–5.10)
RDW: 12.4 % (ref 11.0–15.0)
Total Lymphocyte: 27.5 %
WBC: 6.2 10*3/uL (ref 3.8–10.8)

## 2019-04-23 LAB — COMPLETE METABOLIC PANEL WITH GFR
AG Ratio: 1.7 (calc) (ref 1.0–2.5)
ALT: 6 U/L (ref 6–29)
AST: 13 U/L (ref 10–35)
Albumin: 4 g/dL (ref 3.6–5.1)
Alkaline phosphatase (APISO): 61 U/L (ref 37–153)
BUN/Creatinine Ratio: 19 (calc) (ref 6–22)
BUN: 36 mg/dL — ABNORMAL HIGH (ref 7–25)
CO2: 32 mmol/L (ref 20–32)
Calcium: 9.5 mg/dL (ref 8.6–10.4)
Chloride: 95 mmol/L — ABNORMAL LOW (ref 98–110)
Creat: 1.88 mg/dL — ABNORMAL HIGH (ref 0.50–0.99)
GFR, Est African American: 33 mL/min/{1.73_m2} — ABNORMAL LOW (ref >=60)
GFR, Est Non African American: 29 mL/min/{1.73_m2} — ABNORMAL LOW (ref >=60)
Globulin: 2.3 g/dL (calc) (ref 1.9–3.7)
Glucose, Bld: 93 mg/dL (ref 65–99)
Potassium: 4.6 mmol/L (ref 3.5–5.3)
Sodium: 132 mmol/L — ABNORMAL LOW (ref 135–146)
Total Bilirubin: 0.3 mg/dL (ref 0.2–1.2)
Total Protein: 6.3 g/dL (ref 6.1–8.1)

## 2019-04-23 LAB — LIPID PANEL
Cholesterol: 163 mg/dL (ref <200)
HDL: 33 mg/dL — ABNORMAL LOW (ref >=50)
LDL Cholesterol (Calc): 103 mg/dL (calc) — ABNORMAL HIGH
Non-HDL Cholesterol (Calc): 130 mg/dL (calc) — ABNORMAL HIGH (ref <130)
Total CHOL/HDL Ratio: 4.9 (calc) (ref <5.0)
Triglycerides: 156 mg/dL — ABNORMAL HIGH (ref <150)

## 2019-04-23 LAB — MICROALBUMIN / CREATININE URINE RATIO
Creatinine, Urine: 44 mg/dL (ref 20–275)
Microalb Creat Ratio: 16 mcg/mg creat (ref <30)
Microalb, Ur: 0.7 mg/dL

## 2019-04-23 LAB — VITAMIN D 25 HYDROXY (VIT D DEFICIENCY, FRACTURES): Vit D, 25-Hydroxy: 93 ng/mL (ref 30–100)

## 2019-04-23 LAB — HEMOGLOBIN A1C
Hgb A1c MFr Bld: 5.3 % of total Hgb (ref <5.7)
Mean Plasma Glucose: 105 (calc)
eAG (mmol/L): 5.8 (calc)

## 2019-04-23 LAB — LEVETIRACETAM LEVEL: Keppra (Levetiracetam): 33.1 ug/mL (ref 12.0–46.0)

## 2019-04-23 LAB — MAGNESIUM: Magnesium: 2 mg/dL (ref 1.5–2.5)

## 2019-04-23 LAB — TSH: TSH: 0.18 mIU/L — ABNORMAL LOW (ref 0.40–4.50)

## 2019-04-23 LAB — VITAMIN B12: Vitamin B-12: 346 pg/mL (ref 200–1100)

## 2019-05-05 NOTE — Progress Notes (Signed)
Patient states that she has the box in the bathroom and plans on completing it today or tomorrow.

## 2019-05-20 NOTE — Progress Notes (Signed)
Patient states that she is planning on completing this hopefully by next week.

## 2019-06-22 ENCOUNTER — Other Ambulatory Visit: Payer: Self-pay

## 2019-06-22 DIAGNOSIS — R569 Unspecified convulsions: Secondary | ICD-10-CM

## 2019-06-22 MED ORDER — LEVETIRACETAM 500 MG PO TABS: ORAL_TABLET | ORAL | 2 refills | Status: DC

## 2019-06-22 NOTE — Progress Notes (Signed)
Patient states that she plans on completing it this week.

## 2019-06-29 ENCOUNTER — Other Ambulatory Visit: Payer: Self-pay | Admitting: Internal Medicine

## 2019-06-29 DIAGNOSIS — Z1231 Encounter for screening mammogram for malignant neoplasm of breast: Secondary | ICD-10-CM

## 2019-07-01 ENCOUNTER — Other Ambulatory Visit: Payer: Self-pay

## 2019-07-01 ENCOUNTER — Ambulatory Visit
Admission: RE | Admit: 2019-07-01 | Discharge: 2019-07-01 | Disposition: A | Discharge status: Home / Self Care | Payer: 59 | Source: Ambulatory Visit | Attending: Internal Medicine | Admitting: Internal Medicine

## 2019-07-01 DIAGNOSIS — Z1231 Encounter for screening mammogram for malignant neoplasm of breast: Secondary | ICD-10-CM

## 2019-07-06 ENCOUNTER — Ambulatory Visit (INDEPENDENT_AMBULATORY_CARE_PROVIDER_SITE_OTHER): Payer: 59 | Admitting: Adult Health

## 2019-07-06 ENCOUNTER — Other Ambulatory Visit: Payer: Self-pay

## 2019-07-06 ENCOUNTER — Encounter: Payer: Self-pay | Admitting: Physician Assistant

## 2019-07-06 ENCOUNTER — Ambulatory Visit
Admission: RE | Admit: 2019-07-06 | Discharge: 2019-07-06 | Disposition: A | Discharge status: Home / Self Care | Payer: 59 | Source: Ambulatory Visit | Attending: Adult Health | Admitting: Adult Health

## 2019-07-06 VITALS — BP 128/72 | HR 71 | Temp 97.4°F | Wt 172.2 lb

## 2019-07-06 DIAGNOSIS — W19XXXA Unspecified fall, initial encounter: Secondary | ICD-10-CM

## 2019-07-06 DIAGNOSIS — M79622 Pain in left upper arm: Secondary | ICD-10-CM

## 2019-07-06 MED ORDER — PREDNISONE 20 MG PO TABS
ORAL_TABLET | ORAL | 0 refills | Status: DC
Start: 2019-07-06 — End: 2019-09-07

## 2019-07-06 NOTE — Patient Instructions (Signed)
  Xray at [address] [facility]     Contusion A contusion is a deep bruise. Contusions are the result of a blunt injury to tissues and muscle fibers under the skin. The injury causes bleeding under the skin. The skin overlying the contusion may turn blue, purple, or yellow. Minor injuries will give you a painless contusion, but more severe injuries cause contusions that may stay painful and swollen for a few weeks. Follow these instructions at home: Pay attention to any changes in your symptoms. Let your health care provider know about them. Take these actions to relieve your pain. Managing pain, stiffness, and swelling   Use resting, icing, applying pressure (compression), and raising (elevating) the injured area. This is often called the RICE strategy. ? Rest the injured area. Return to your normal activities as told by your health care provider. Ask your health care provider what activities are safe for you. ? If directed, put ice on the injured area:  Put ice in a plastic bag.  Place a towel between your skin and the bag.  Leave the ice on for 20 minutes, 2-3 times per day. ? If directed, apply light compression to the injured area using an elastic bandage. Make sure the bandage is not wrapped too tightly. Remove and reapply the bandage as directed by your health care provider. ? If possible, raise (elevate) the injured area above the level of your heart while you are sitting or lying down. General instructions  Take over-the-counter and prescription medicines only as told by your health care provider.  Keep all follow-up visits as told by your health care provider. This is important. Contact a health care provider if:  Your symptoms do not improve after several days of treatment.  Your symptoms get worse.  You have difficulty moving the injured area. Get help right away if:  You have severe pain.  You have numbness in a hand or foot.  Your hand or foot turns  pale or cold. Summary  A contusion is a deep bruise.  Contusions are the result of a blunt injury to tissues and muscle fibers under the skin.  It is treated with rest, ice, compression, and elevation. You may be given over-the-counter medicines for pain.  Contact a health care provider if your symptoms do not improve, or get worse.  Get help right away if you have severe pain, have numbness, or the area turns pale or cold. This information is not intended to replace advice given to you by your health care provider. Make sure you discuss any questions you have with your health care provider. Document Revised: 10/03/2017 Document Reviewed: 10/03/2017 Elsevier Patient Education  [place].

## 2019-07-06 NOTE — Progress Notes (Signed)
Subjective:    Patient ID: Olivia Walsh, female    DOB: [birth year], 59 y.o.   MRN: [id number]   BP 128/72   Pulse 71   Temp (!) 97.4 F (36.3 C)   Wt 172 lb 3.2 oz (78.1 kg)   SpO2 96%   BMI 26.97 kg/m   HPI    59 y.o. 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 She reports immediate pain at the time, in reports 7/10 intermittent, with movement or resistance only (pain described as deep ache, "feels like bruised bone"). She has been able to prop her arm up and type for work. Pain has been stable since yesterday. Denies swelling, numbness/tingling. She has tried heat with some benefit, hasn't tried ice, has been taking tylenol with minimal benefit, NSAIDs are contraindicated due to CKD 4.    Blood pressure 128/72, pulse 71, temperature (!) 97.4 F (36.3 C), weight 172 lb 3.2 oz (78.1 kg), SpO2 96 %.  Medications  Current Outpatient Medications (Endocrine & Metabolic):  .  levothyroxine (SYNTHROID) 75 MCG tablet, Take 1 tablet daily on an empty stomach with only water for 30 minutes & no Antacid meds, Calcium or Magnesium for 4 hours & avoid Biotin .  predniSONE (DELTASONE) 20 MG tablet, 2 tablets daily for 3 days, 1 tablet daily for 4 days.  Current Outpatient Medications (Cardiovascular):  .  ezetimibe (ZETIA) 10 MG tablet, Take 1 tablet (10 mg total) by mouth daily. .  furosemide (LASIX) 40 MG tablet, TAKE 1 TABLET BY MOUTH 2  TIMES DAILY FOR BLOOD  PRESSURE AND FLUID .  labetalol (NORMODYNE) 300 MG tablet, TAKE 1 TABLET BY MOUTH TWO  TIMES DAILY .  losartan (COZAAR) 50 MG tablet, Take 50 mg by mouth daily. .  rosuvastatin (CRESTOR) 40 MG tablet, Take 1 tablet Daily for Cholesterol    Current Outpatient Medications (Hematological):  .  clopidogrel (PLAVIX) 75 MG tablet, Take 1 tablet Daily to Prevent Blood  Clots  Current Outpatient Medications (Other):  Marland Kitchen  buPROPion (WELLBUTRIN XL) 150 MG 24 hr tablet, Take 1 tablet Daily for Mood , Focus & Concentration .  levETIRAcetam (KEPPRA) 500 MG tablet, TAKE 1 TABLET BY MOUTH  TWICE DAILY FOR SEIZURES .  Vitamin D, Ergocalciferol, (DRISDOL) 50000 UNITS CAPS capsule, TAKE 1 CAPSULE DAILY FOR SEVERE VITAMIN D DEFICIENCY SECONDARY TO MALABSORPTION  Problem list She has Hypothyroidism; Hyperlipidemia; SMOKER; Essential hypertension; CAD (coronary artery disease); History of CVA (cerebrovascular accident); Peripheral vascular disease ([place]); CKD (chronic kidney disease) stage 4, GFR 15-29 ml/min ([place]); Seizure disorder ([place]); Vitamin D deficiency; Poor compliance; Medication management; Overweight (BMI 25.0-29.9); Normocytic anemia; Claudication of left lower extremity ([place]); Emphysema of lung ([place]); Aortic atherosclerosis ([place]); Cholelithiases; and Atypical nevus of right lower back on their problem list.   Review of Systems  Constitutional: Negative.   HENT: Negative.   Eyes: Negative.   Respiratory: Negative.   Cardiovascular: Negative.   Musculoskeletal: Negative for arthralgias, back pain, gait problem, joint swelling and neck pain.  Skin: Negative for color change, rash and wound.  Neurological: Negative.         Objective:   Physical Exam Constitutional:      General: She is not in acute distress.    Appearance: Normal appearance. She is not ill-appearing.  HENT:     Head: Normocephalic  and atraumatic.  Eyes:     Conjunctiva/sclera: Conjunctivae normal.     Pupils: Pupils are equal, round, and reactive to light.  Cardiovascular:     Rate and Rhythm: Normal rate and regular rhythm.     Pulses: Normal pulses.     Heart sounds: Normal heart sounds. No murmur. No friction rub. No gallop.   Pulmonary:     Effort: Pulmonary effort is normal. No respiratory distress.     Breath sounds: Normal breath sounds. No wheezing, rhonchi or rales.   Chest:     Chest wall: No tenderness.  Abdominal:     General: Abdomen is flat. Bowel sounds are normal. There is no distension.     Palpations: Abdomen is soft.     Tenderness: There is no abdominal tenderness.  Musculoskeletal:        General: Tenderness present. No swelling or deformity.     Comments: Full passive ROM through left shoulder and elbow; deep ache to mid/lower shaft with palpation, no palpable/apprent hematoma, biceps tendon intact without tenderness. Pain limiting strength exam with flexion and extension of elbow. No effusion or tenderness in elbow joint. Wrist and hand full active ROM, distal sensation and pulses intact.   Skin:    General: Skin is warm and dry.     Findings: No bruising, lesion or rash.  Neurological:     General: No focal deficit present.     Mental Status: She is alert and oriented to person, place, and time. Mental status is at baseline.  Psychiatric:        Mood and Affect: Mood normal.        Behavior: Behavior normal.        Thought Content: Thought content normal.        Judgment: Judgment normal.         Assessment & Plan:    Olivia Walsh was seen today for acute visit and fall.  Diagnoses and all orders for this visit:  Left upper arm pain Fall, initial encounter Mechanical fall on left upper arm with contusion vs r/o nondisplaced incomplete fracture Will obtain xray  NSAID contraindicated due to CKD 4, given prednisone, will do low dose tramadol if needed for breakthrough pain If fracture ruled out, ecommended sling PRN 1-2 weeks, rest, ice/heat Exam doesn't suggest tendon rupture or other highly concerning soft tissue injury However if pain remains significant limiting ROM, any weakness in 2 weeks, consider MRI, ortho referral  -     DG Humerus Left; Future -     predniSONE (DELTASONE) 20 MG tablet; 2 tablets daily for 3 days, 1 tablet daily for 4 days.   Izora Ribas, NP 10:13 AM [facility]

## 2019-07-09 ENCOUNTER — Other Ambulatory Visit: Payer: Self-pay | Admitting: Physician Assistant

## 2019-07-20 ENCOUNTER — Other Ambulatory Visit: Payer: Self-pay

## 2019-07-20 MED ORDER — FUROSEMIDE 40 MG PO TABS: 40.0000 mg | ORAL_TABLET | Freq: Two times a day (BID) | ORAL | 0 refills | Status: DC

## 2019-08-03 ENCOUNTER — Ambulatory Visit: Payer: 59 | Admitting: Adult Health

## 2019-08-24 ENCOUNTER — Ambulatory Visit: Payer: 59

## 2019-09-03 ENCOUNTER — Ambulatory Visit: Payer: 59

## 2019-09-04 ENCOUNTER — Ambulatory Visit
Admission: RE | Admit: 2019-09-04 | Discharge: 2019-09-04 | Disposition: A | Discharge status: Home / Self Care | Payer: 59 | Source: Ambulatory Visit | Attending: Adult Health | Admitting: Adult Health

## 2019-09-04 ENCOUNTER — Other Ambulatory Visit: Payer: Self-pay

## 2019-09-04 DIAGNOSIS — Z122 Encounter for screening for malignant neoplasm of respiratory organs: Secondary | ICD-10-CM

## 2019-09-04 DIAGNOSIS — F17200 Nicotine dependence, unspecified, uncomplicated: Secondary | ICD-10-CM

## 2019-09-04 NOTE — Progress Notes (Signed)
FOLLOW UP  Assessment and Plan:    Atherosclerosis of aorta Per CT chest Control blood pressure, cholesterol, glucose, increase exercise.   CAD Continue plavix, ASA Control blood pressure, cholesterol, glucose, increase exercise.  Reminded overdue cardiology follow up, no concerns  Hypertension Well controlled with current medications  Monitor blood pressure at home; patient to call if consistently greater than 130/80 Continue DASH diet.   Reminder to go to the ER if any CP, SOB, nausea, dizziness, severe HA, changes vision/speech, left arm numbness and tingling and jaw pain.  Cholesterol Currently mildly above LDL goal (<70); taking rosuvastatin 40 mg and zetia 10 mg (unsure- confirm), consider nexlizet Continue low cholesterol diet and exercise.  Check lipid panel.   Other abnormal glucose Recent A1Cs at goal Discussed diet/exercise, weight management  Defer A1C; check CMP  CKD stage 4 Followed by Olivia Walsh Increase fluids, avoid NSAIDS, monitor sugars, will monitor Check CMP/GFR  Hypothyroidism continue medications the same pending lab results reminded to take on an empty stomach 30-30mins before food.  check TSH level  Overweight with co morbidities Long discussion about weight loss, diet, and exercise Recommended diet heavy in fruits and veggies and low in animal meats, cheeses, and dairy products, appropriate calorie intake Will follow up in 3 months  Vitamin D Def At goal at last visit; continue supplementation to maintain goal of 60-100 Defer Vit D level  Tobacco use Discussed risks associated with tobacco use and advised to reduce or quit Patient is ready to do so and plans to try nicotine gum and slow taper She doesn't want to do chantix, on wellbutrin Had normal CT scan July 2021 Will follow up at the next visit  Centrilobular emphysema (Olivia Walsh) Denies sx; by imaging; STOP SMOKING  Seizures Last seizure remote; stable on  keppra  PVD/intermittent claudication  Pending ABI rescheduled due to covid 64 and husband health She is on statin, plavix; have encouraged smoking cessation which she is working on Control blood pressure, cholesterol, glucose, increase exercise, rest frequently as needed   Continue diet and meds as discussed. Further disposition pending results of labs. Discussed med's effects and SE's.   Over 30 minutes of exam, counseling, chart review, and critical decision making was performed.   Future Appointments  Date Time Provider [place]  12/21/2019 11:30 AM Olivia Comber, NP [facility] None  04/19/2020  3:00 PM Olivia Comber, NP [facility] None    ----------------------------------------------------------------------------------------------------------------------  HPI 59 y.o. female  presents for 3 month follow up on hypertension, cholesterol, glucose management, weight and vitamin D deficiency. Hx of ruptured cerebral aneurysm in 2005 with repeated repair in 2012, hx of seizure order on keppra - no recent seizures - last in 2008-  Followed by Olivia Walsh.   Husband had stroke with expressive aphasia, doing speech therapy with some recovery but patient states she is doing a lot more, feels some stress and racing thoughts, though denies depression/anxiety and doesn't want meds for this.   BMI is Body mass index is 26.12 kg/m., she has not been working on diet and exercise, has been watching portions, being more conscious, avoiding morning biscuit, breads, soda, etc  She has pedal machine at home under work desk.  Wt Readings from Last 3 Encounters:  09/07/19 166 lb 12.8 oz (75.7 kg)  07/06/19 172 lb 3.2 oz (78.1 kg)  04/20/19 172 lb 12.8 oz (78.4 kg)   she currently continues to smoke 1 pack a day; discussed risks associated with smoking, patient is  not ready to quit; she tried quitting with her husband (he had lung CA), nicotine gum and slow taper but didn't do well  with pandemic; she has chantix but never started as not ready, has been on wellbutrin 150 mg with some benefit but stopped taking.  She had benign appearing CT lung cancer screen on 09/04/2019. She does have mild centrolobular emphysema by imaging  She has ASCVD with bypass in 2002, aortic atherosclerosis per chest CT, followed annually by cardiology Olivia Walsh.  She has hx of PVD, has reported claudication symptoms of LLE ongoing since last year, ABI was ordered but she hasn't pursued due to husband health situation She is on statin and plavix.  Her blood pressure has been controlled at home (110-120s/80s), today their BP is BP: 132/78   She does workout occasionally - has seated cycling machine that she does while at her desk. She denies chest pain, shortness of breath, dizziness.    She is on cholesterol medication (crestor 40 mg daily, zetia 10 mg daily- unsure if taking this one) and denies myalgias. Her LDL cholesterol is not at goal.The cholesterol last visit was:   Lab Results  Component Value Date   CHOL 163 04/20/2019   HDL 33 (L) 04/20/2019   LDLCALC 103 (H) 04/20/2019   TRIG 156 (H) 04/20/2019   CHOLHDL 4.9 04/20/2019    She has been working on diet and exercise for glucose management (intermittent hx of prediabetes), and denies foot ulcerations, increased appetite, nausea, paresthesia of the feet, polydipsia, polyuria, visual disturbances, vomiting and weight loss. Last A1C in the office was:  Lab Results  Component Value Date   HGBA1C 5.3 04/20/2019   She is on thyroid medication. Her medication was not changed last visit. 75 mcg daily. Lab Results  Component Value Date   TSH 0.18 (L) 04/20/2019    She is followed by Olivia Walsh for CKD 3/4. She has stopped taking goody powders admits still smoking. She drinks 3-4 bottles of water daily.  Lab Results  Component Value Date   GFRNONAA 29 (L) 04/20/2019   Patient is not on Vitamin D supplement, stopped taking  previously after high value and at goal at last check, now has been off completely for several months, previously taking 50000 IU 3 days a week:    Lab Results  Component Value Date   VD25OH 71 04/20/2019      Current Medications:  Current Outpatient Medications on File Prior to Visit  Medication Sig  . buPROPion (WELLBUTRIN XL) 150 MG 24 hr tablet Take 1 tablet Daily for Mood , Focus & Concentration  . clopidogrel (PLAVIX) 75 MG tablet Take 1 tablet Daily to Prevent Blood Clots  . ezetimibe (ZETIA) 10 MG tablet TAKE 1 TABLET(10 MG) BY MOUTH DAILY  . furosemide (LASIX) 40 MG tablet Take 1 tablet (40 mg total) by mouth 2 (two) times daily.  Marland Kitchen labetalol (NORMODYNE) 300 MG tablet TAKE 1 TABLET BY MOUTH TWO  TIMES DAILY  . levETIRAcetam (KEPPRA) 500 MG tablet TAKE 1 TABLET BY MOUTH  TWICE DAILY FOR SEIZURES  . levothyroxine (SYNTHROID) 75 MCG tablet Take 1 tablet daily on an empty stomach with only water for 30 minutes & no Antacid meds, Calcium or Magnesium for 4 hours & avoid Biotin  . losartan (COZAAR) 50 MG tablet Take 50 mg by mouth daily.  . rosuvastatin (CRESTOR) 40 MG tablet Take 1 tablet Daily for Cholesterol  . predniSONE (DELTASONE) 20 MG tablet 2 tablets daily  for 3 days, 1 tablet daily for 4 days.  . Vitamin D, Ergocalciferol, (DRISDOL) 50000 UNITS CAPS capsule TAKE 1 CAPSULE DAILY FOR SEVERE VITAMIN D DEFICIENCY SECONDARY TO MALABSORPTION (Patient not taking: Reported on 09/07/2019)   No current facility-administered medications on file prior to visit.     Allergies:  Allergies  Allergen Reactions  . Ace Inhibitors Cough  . Penicillins   . Prednisone     Dysphoria     Medical History:  Past Medical History:  Diagnosis Date  . CAD (coronary artery disease)   . CKD (chronic kidney disease) stage 3, GFR 30-59 ml/min   . History of basal cell carcinoma excision 12/20/2017  . Hyperlipidemia LDL goal <70   . Hypertension   . Hypothyroidism   . Tobacco abuse   . Vitamin  D deficiency    Family history- Reviewed and unchanged Social history- Reviewed and unchanged   Review of Systems:  Review of Systems  Constitutional: Negative for malaise/fatigue and weight loss.  HENT: Negative for hearing loss and tinnitus.   Eyes: Negative for blurred vision and double vision.  Respiratory: Negative for cough, shortness of breath and wheezing.   Cardiovascular: Positive for claudication (LLE after 5 blocks). Negative for chest pain, palpitations, orthopnea and leg swelling.  Gastrointestinal: Negative for abdominal pain, blood in stool, constipation, diarrhea, heartburn, melena, nausea and vomiting.  Genitourinary: Negative.   Musculoskeletal: Negative for joint pain and myalgias.  Skin: Negative for rash.  Neurological: Negative for dizziness, tingling, sensory change, weakness and headaches.  Endo/Heme/Allergies: Negative for polydipsia.  Psychiatric/Behavioral: Negative.   All other systems reviewed and are negative.    Physical Exam: BP 132/78   Pulse (!) 59   Temp (!) 97.1 F (36.2 C)   Ht 5\' 7"  (1.702 m)   Wt 166 lb 12.8 oz (75.7 kg)   SpO2 99%   BMI 26.12 kg/m  Wt Readings from Last 3 Encounters:  09/07/19 166 lb 12.8 oz (75.7 kg)  07/06/19 172 lb 3.2 oz (78.1 kg)  04/20/19 172 lb 12.8 oz (78.4 kg)   General Appearance: Well nourished, in no apparent distress. Eyes: PERRLA, EOMs, conjunctiva no swelling or erythema Sinuses: No Frontal/maxillary tenderness ENT/Mouth: Ext aud canals clear, TMs without erythema, bulging. No erythema, swelling, or exudate on post pharynx.  Tonsils not swollen or erythematous. Hearing normal.  Neck: Supple, thyroid normal.  Respiratory: Respiratory effort normal, BS equal bilaterally without rales, rhonchi, wheezing or stridor.  Cardio: RRR with harsh 3/6 decreshendo systolic murmur. Thready peripheral pulses with nonpitting edema to bilateral ankles/lower legs, chronic and unchanged.  Abdomen: Soft, + BS.  Non  tender, no guarding, rebound, hernias, masses. Lymphatics: Non tender without lymphadenopathy.  Musculoskeletal: Full ROM, 5/5 strength, Normal gait Skin: Warm, dry; multiple pearly growths to face and neck without erythema or ulceration.  Neuro: Cranial nerves intact. No cerebellar symptoms. Sensation intact.  Psych: Awake and oriented X 3, normal affect, Insight and Judgment appropriate.    Olivia Ribas, NP 3:01 PM [facility]

## 2019-09-07 ENCOUNTER — Other Ambulatory Visit: Payer: Self-pay

## 2019-09-07 ENCOUNTER — Ambulatory Visit (INDEPENDENT_AMBULATORY_CARE_PROVIDER_SITE_OTHER): Payer: 59 | Admitting: Adult Health

## 2019-09-07 ENCOUNTER — Encounter: Payer: Self-pay | Admitting: Adult Health

## 2019-09-07 VITALS — BP 132/78 | HR 59 | Temp 97.1°F | Ht 67.0 in | Wt 166.8 lb

## 2019-09-07 DIAGNOSIS — G40909 Epilepsy, unspecified, not intractable, without status epilepticus: Secondary | ICD-10-CM

## 2019-09-07 DIAGNOSIS — Z79899 Other long term (current) drug therapy: Secondary | ICD-10-CM

## 2019-09-07 DIAGNOSIS — I10 Essential (primary) hypertension: Secondary | ICD-10-CM

## 2019-09-07 DIAGNOSIS — E782 Mixed hyperlipidemia: Secondary | ICD-10-CM

## 2019-09-07 DIAGNOSIS — N184 Chronic kidney disease, stage 4 (severe): Secondary | ICD-10-CM

## 2019-09-07 DIAGNOSIS — I700 Atherosclerosis of aorta: Secondary | ICD-10-CM | POA: Diagnosis not present

## 2019-09-07 DIAGNOSIS — J432 Centrilobular emphysema: Secondary | ICD-10-CM

## 2019-09-07 DIAGNOSIS — E559 Vitamin D deficiency, unspecified: Secondary | ICD-10-CM

## 2019-09-07 DIAGNOSIS — E039 Hypothyroidism, unspecified: Secondary | ICD-10-CM

## 2019-09-07 DIAGNOSIS — F17200 Nicotine dependence, unspecified, uncomplicated: Secondary | ICD-10-CM

## 2019-09-07 DIAGNOSIS — I2510 Atherosclerotic heart disease of native coronary artery without angina pectoris: Secondary | ICD-10-CM | POA: Diagnosis not present

## 2019-09-07 DIAGNOSIS — I739 Peripheral vascular disease, unspecified: Secondary | ICD-10-CM | POA: Diagnosis not present

## 2019-09-07 DIAGNOSIS — E663 Overweight: Secondary | ICD-10-CM

## 2019-09-07 NOTE — Patient Instructions (Addendum)
Goals    . DIET - INCREASE WATER INTAKE     Aim for 4-5 bottles daily     . LDL CALC < 70    . Quit Smoking    . Weight (lb) < 160 lb (72.6 kg)      Double check all meds - ? Still taking ezetimibe/zetia for cholesterol    Try melatonin 5-15 mg prior to sleep     Managing Stress, Adult Feeling a certain amount of stress is normal. Stress helps our body and mind get ready to deal with the demands of life. Stress hormones can motivate you to do well at work and meet your responsibilities. However severe or long-lasting (chronic) stress can affect your mental and physical health. Chronic stress puts you at higher risk for anxiety, depression, and other health problems like digestive problems, muscle aches, heart disease, high blood pressure, and stroke. What are the causes? Common causes of stress include:  Demands from work, such as deadlines, feeling overworked, or having long hours.  Pressures at home, such as money issues, disagreements with a spouse, or parenting issues.  Pressures from major life changes, such as divorce, moving, loss of a loved one, or chronic illness. You may be at higher risk for stress-related problems if you do not get enough sleep, are in poor health, do not have emotional support, or have a mental health disorder like anxiety or depression. How to recognize stress Stress can make you:  Have trouble sleeping.  Feel sad, anxious, irritable, or overwhelmed.  Lose your appetite.  Overeat or want to eat unhealthy foods.  Want to use drugs or alcohol. Stress can also cause physical symptoms, such as:  Sore, tense muscles, especially in the shoulders and neck.  Headaches.  Trouble breathing.  A faster heart rate.  Stomach pain, nausea, or vomiting.  Diarrhea or constipation.  Trouble concentrating. Follow these instructions at home: Lifestyle  Identify the source of your stress and your reaction to it. See a therapist who can help you  change your reactions.  When there are stressful events: ? Talk about it with family, friends, or co-workers. ? Try to think realistically about stressful events and not ignore them or overreact. ? Try to find the positives in a stressful situation and not focus on the negatives. ? Cut back on responsibilities at work and home, if possible. Ask for help from friends or family members if you need it.  Find ways to cope with stress, such as: ? Meditation. ? Deep breathing. ? Yoga or tai chi. ? Progressive muscle relaxation. ? Doing art, playing music, or reading. ? Making time for fun activities. ? Spending time with family and friends.  Get support from family, friends, or spiritual resources. Eating and drinking  Eat a healthy diet. This includes: ? Eating foods that are high in fiber, such as beans, whole grains, and fresh fruits and vegetables. ? Limiting foods that are high in fat and processed sugars, such as fried and sweet foods.  Do not skip meals or overeat.  Drink enough fluid to keep your urine pale yellow. Alcohol use  Do not drink alcohol if: ? Your health care provider tells you not to drink. ? You are pregnant, may be pregnant, or are planning to become pregnant.  Drinking alcohol is a way some people try to ease their stress. This can be dangerous, so if you drink alcohol: ? Limit how much you use to:  0-1 drink a day for  women.  0-2 drinks a day for men. ? Be aware of how much alcohol is in your drink. In the [place] one drink equals one 12 oz bottle of beer (355 mL), one 5 oz glass of wine (148 mL), or one 1 oz glass of hard liquor (44 mL). Activity   Include 30 minutes of exercise in your daily schedule. Exercise is a good stress reducer.  Include time in your day for an activity that you find relaxing. Try taking a walk, going on a bike ride, reading a book, or listening to music.  Schedule your time in a way that lowers stress, and keep a consistent  schedule. Prioritize what is most important to get done. General instructions  Get enough sleep. Try to go to sleep and get up at about the same time every day.  Take over-the-counter and prescription medicines only as told by your health care provider.  Do not use any products that contain nicotine or tobacco, such as cigarettes, e-cigarettes, and chewing tobacco. If you need help quitting, ask your health care provider.  Do not use drugs or smoke to cope with stress.  Keep all follow-up visits as told by your health care provider. This is important. Where to find support  Talk with your health care provider about stress management or finding a support group.  Find a therapist to work with you on your stress management techniques. Contact a health care provider if:  Your stress symptoms get worse.  You are unable to manage your stress at home.  You are struggling to stop using drugs or alcohol. Get help right away if:  You may be a danger to yourself or others.  You have any thoughts of death or suicide. If you ever feel like you may hurt yourself or others, or have thoughts about taking your own life, get help right away. You can go to your nearest emergency department or call:  Your local emergency services (911 in the [place]  A suicide crisis helpline, such as the [place] at (430)380-5310. This is open 24 hours a day. Summary  Feeling a certain amount of stress is normal, but severe or long-lasting (chronic) stress can affect your mental and physical health.  Chronic stress can put you at higher risk for anxiety, depression, and other health problems like digestive problems, muscle aches, heart disease, high blood pressure, and stroke.  You may be at higher risk for stress-related problems if you do not get enough sleep, are in poor health, lack emotional support, or have a mental health disorder like anxiety or depression.  Identify the  source of your stress and your reaction to it. Try talking about stressful events with family, friends, or co-workers, finding a coping method, or getting support from spiritual resources.  If you need more help, talk with your health care provider about finding a support group or a mental health therapist. This information is not intended to replace advice given to you by your health care provider. Make sure you discuss any questions you have with your health care provider. Document Revised: 09/10/2018 Document Reviewed: 09/10/2018 Elsevier Patient Education  [place].

## 2019-09-08 ENCOUNTER — Other Ambulatory Visit: Payer: Self-pay | Admitting: Adult Health

## 2019-09-08 DIAGNOSIS — E039 Hypothyroidism, unspecified: Secondary | ICD-10-CM

## 2019-09-08 MED ORDER — LEVOTHYROXINE SODIUM 75 MCG PO TABS: ORAL_TABLET | ORAL | 1 refills | Status: DC

## 2019-09-09 LAB — LEVETIRACETAM LEVEL: Keppra (Levetiracetam): 42.6 ug/mL (ref 12.0–46.0)

## 2019-09-09 LAB — CBC WITH DIFFERENTIAL/PLATELET
Absolute Monocytes: 557 cells/uL (ref 200–950)
Basophils Absolute: 70 cells/uL (ref 0–200)
Basophils Relative: 1.1 %
Eosinophils Absolute: 307 cells/uL (ref 15–500)
Eosinophils Relative: 4.8 %
HCT: 36.6 % (ref 35.0–45.0)
Hemoglobin: 11.7 g/dL (ref 11.7–15.5)
Lymphs Abs: 1850 cells/uL (ref 850–3900)
MCH: 30.3 pg (ref 27.0–33.0)
MCHC: 32 g/dL (ref 32.0–36.0)
MCV: 94.8 fL (ref 80.0–100.0)
MPV: 10.2 fL (ref 7.5–12.5)
Monocytes Relative: 8.7 %
Neutro Abs: 3616 cells/uL (ref 1500–7800)
Neutrophils Relative %: 56.5 %
Platelets: 235 10*3/uL (ref 140–400)
RBC: 3.86 10*6/uL (ref 3.80–5.10)
RDW: 12.7 % (ref 11.0–15.0)
Total Lymphocyte: 28.9 %
WBC: 6.4 10*3/uL (ref 3.8–10.8)

## 2019-09-09 LAB — COMPLETE METABOLIC PANEL WITH GFR
AG Ratio: 1.6 (calc) (ref 1.0–2.5)
ALT: 15 U/L (ref 6–29)
AST: 18 U/L (ref 10–35)
Albumin: 4.2 g/dL (ref 3.6–5.1)
Alkaline phosphatase (APISO): 85 U/L (ref 37–153)
BUN/Creatinine Ratio: 22 (calc) (ref 6–22)
BUN: 49 mg/dL — ABNORMAL HIGH (ref 7–25)
CO2: 27 mmol/L (ref 20–32)
Calcium: 9.4 mg/dL (ref 8.6–10.4)
Chloride: 100 mmol/L (ref 98–110)
Creat: 2.27 mg/dL — ABNORMAL HIGH (ref 0.50–0.99)
GFR, Est African American: 26 mL/min/{1.73_m2} — ABNORMAL LOW (ref >=60)
GFR, Est Non African American: 23 mL/min/{1.73_m2} — ABNORMAL LOW (ref >=60)
Globulin: 2.6 g/dL (calc) (ref 1.9–3.7)
Glucose, Bld: 85 mg/dL (ref 65–99)
Potassium: 4.1 mmol/L (ref 3.5–5.3)
Sodium: 138 mmol/L (ref 135–146)
Total Bilirubin: 0.3 mg/dL (ref 0.2–1.2)
Total Protein: 6.8 g/dL (ref 6.1–8.1)

## 2019-09-09 LAB — VITAMIN D 25 HYDROXY (VIT D DEFICIENCY, FRACTURES): Vit D, 25-Hydroxy: 76 ng/mL (ref 30–100)

## 2019-09-09 LAB — MAGNESIUM: Magnesium: 2.4 mg/dL (ref 1.5–2.5)

## 2019-09-09 LAB — LIPID PANEL
Cholesterol: 263 mg/dL — ABNORMAL HIGH (ref <200)
HDL: 35 mg/dL — ABNORMAL LOW (ref >=50)
LDL Cholesterol (Calc): 190 mg/dL (calc) — ABNORMAL HIGH
Non-HDL Cholesterol (Calc): 228 mg/dL (calc) — ABNORMAL HIGH (ref <130)
Total CHOL/HDL Ratio: 7.5 (calc) — ABNORMAL HIGH (ref <5.0)
Triglycerides: 203 mg/dL — ABNORMAL HIGH (ref <150)

## 2019-09-09 LAB — TSH: TSH: 0.12 mIU/L — ABNORMAL LOW (ref 0.40–4.50)

## 2019-09-21 ENCOUNTER — Other Ambulatory Visit: Payer: Self-pay | Admitting: *Deleted

## 2019-09-21 DIAGNOSIS — E039 Hypothyroidism, unspecified: Secondary | ICD-10-CM

## 2019-09-21 MED ORDER — LEVOTHYROXINE SODIUM 75 MCG PO TABS: ORAL_TABLET | ORAL | 1 refills | Status: DC

## 2019-09-24 ENCOUNTER — Other Ambulatory Visit: Payer: Self-pay | Admitting: Internal Medicine

## 2019-09-25 ENCOUNTER — Other Ambulatory Visit: Payer: Self-pay | Admitting: *Deleted

## 2019-09-25 DIAGNOSIS — E039 Hypothyroidism, unspecified: Secondary | ICD-10-CM

## 2019-09-25 MED ORDER — LEVOTHYROXINE SODIUM 75 MCG PO TABS: ORAL_TABLET | ORAL | 1 refills | Status: DC

## 2019-09-27 ENCOUNTER — Other Ambulatory Visit: Payer: Self-pay | Admitting: Internal Medicine

## 2019-09-27 DIAGNOSIS — E039 Hypothyroidism, unspecified: Secondary | ICD-10-CM

## 2019-09-27 MED ORDER — LEVOTHYROXINE SODIUM 75 MCG PO TABS: ORAL_TABLET | ORAL | 0 refills | Status: DC

## 2019-10-02 ENCOUNTER — Other Ambulatory Visit: Payer: Self-pay | Admitting: Internal Medicine

## 2019-10-02 DIAGNOSIS — E039 Hypothyroidism, unspecified: Secondary | ICD-10-CM

## 2019-10-02 MED ORDER — LEVOTHYROXINE SODIUM 75 MCG PO TABS: ORAL_TABLET | ORAL | 0 refills | Status: DC

## 2019-10-07 ENCOUNTER — Other Ambulatory Visit: Payer: 59

## 2019-10-07 ENCOUNTER — Other Ambulatory Visit: Payer: Self-pay

## 2019-10-07 DIAGNOSIS — E039 Hypothyroidism, unspecified: Secondary | ICD-10-CM

## 2019-10-07 LAB — TSH: TSH: 0.14 mIU/L — ABNORMAL LOW (ref 0.40–4.50)

## 2019-10-08 ENCOUNTER — Other Ambulatory Visit: Payer: Self-pay | Admitting: Adult Health

## 2019-10-08 DIAGNOSIS — E039 Hypothyroidism, unspecified: Secondary | ICD-10-CM

## 2019-10-08 MED ORDER — LEVOTHYROXINE SODIUM 75 MCG PO TABS: ORAL_TABLET | ORAL | 0 refills | Status: DC

## 2019-10-28 ENCOUNTER — Other Ambulatory Visit: Payer: Self-pay | Admitting: Adult Health

## 2019-10-28 ENCOUNTER — Other Ambulatory Visit: Payer: Self-pay

## 2019-10-28 ENCOUNTER — Other Ambulatory Visit: Payer: 59

## 2019-10-28 DIAGNOSIS — E039 Hypothyroidism, unspecified: Secondary | ICD-10-CM

## 2019-10-29 LAB — TSH: TSH: 0.45 mIU/L (ref 0.40–4.50)

## 2019-11-04 ENCOUNTER — Ambulatory Visit: Payer: 59 | Admitting: Adult Health

## 2019-11-18 ENCOUNTER — Other Ambulatory Visit: Payer: Self-pay | Admitting: Adult Health

## 2019-12-21 ENCOUNTER — Ambulatory Visit: Payer: 59 | Admitting: Adult Health

## 2020-01-04 NOTE — Progress Notes (Signed)
FOLLOW UP  Assessment and Plan:    Atherosclerosis of aorta Per CT chest Control blood pressure, cholesterol, glucose, increase exercise.   CAD Continue plavix, ASA Control blood pressure, cholesterol, glucose, increase exercise.  Reminded overdue cardiology follow up, no concerns  Hypertension Well controlled with current medications  Monitor blood pressure at home; patient to call if consistently greater than 130/80 Continue DASH diet.   Reminder to go to the ER if any CP, SOB, nausea, dizziness, severe HA, changes vision/speech, left arm numbness and tingling and jaw pain.  Cholesterol Currently mildly above LDL goal (<70); taking rosuvastatin 40 mg and zetia 10 mg  Consider nextizet Continue low cholesterol diet and exercise.  Check lipid panel.   Other abnormal glucose Recent A1Cs at goal Discussed diet/exercise, weight management  Defer A1C; check CMP  CKD stage 4 Followed by Dr. Moshe Cipro Increase fluids, avoid NSAIDS, monitor sugars, will monitor Check CMP/GFR  Hypothyroidism continue medications the same pending lab results reminded to take on an empty stomach 30-9mins before food.  check TSH level  Overweight with co morbidities Long discussion about weight loss, diet, and exercise Recommended diet heavy in fruits and veggies and low in animal meats, cheeses, and dairy products, appropriate calorie intake Will follow up in 3 months  Vitamin D Def At goal at last visit; continue supplementation to maintain goal of 60-100 Defer Vit D level  Tobacco use Discussed risks associated with tobacco use and advised to reduce or quit Patient is not ready to do so  She doesn't want to do chantix, wellbutrin didn't help Had normal CT scan July 2021 Will follow up at the next visit  Centrilobular emphysema ([place]) Denies sx; by imaging; STOP SMOKING  Seizures Last seizure remote; stable on keppra  PVD/intermittent claudication  Segmentals/vascular US was  due to covid 43 and husband health, agreeable to schedule today She is on statin, plavix; have encouraged smoking cessation  Control blood pressure, cholesterol, glucose, increase exercise, rest frequently as needed  Need for influenza vaccine Quadrivalent flu vaccine administered without complication today    Continue diet and meds as discussed. Further disposition pending results of labs. Discussed med's effects and SE's.   Over 30 minutes of exam, counseling, chart review, and critical decision making was performed.   Future Appointments  Date Time Provider [place]  04/19/2020  3:00 PM Liane Comber, NP [facility] None    ----------------------------------------------------------------------------------------------------------------------  HPI 59 y.o. female  presents for 3 month follow up on hypertension, cholesterol, glucose management, weight and vitamin D deficiency.   Hx of ruptured cerebral aneurysm in 2005 with repeated repair in 2012, hx of seizure order on keppra - no recent seizures - last in 2008-  Followed by [facility]. Had recent normal keppra level check in July 2021, has been stable for many years.   Husband had stroke with expressive aphasia, doing speech therapy with some recovery but stopped per his preference, he is having more agitation, will just leave the room or go on a drive and she worries about him. She feels some stress, wellbutrin didn't help, doesn't want to try anything else at this time.   BMI is Body mass index is 27.57 kg/m., she has not been working on diet and exercise, has been watching portions, stress/mood has been affecting food choices, some days better than others. She has pedal machine at home under work desk.   Wt Readings from Last 3 Encounters:  01/05/20 176 lb (79.8 kg)  09/07/19 166  lb 12.8 oz (75.7 kg)  07/06/19 172 lb 3.2 oz (78.1 kg)   she currently continues to smoke 1 pack a day; discussed risks associated  with smoking, patient is not ready to quit; she tried quitting with her husband (he had lung CA), nicotine gum and slow taper but didn't do well with pandemic; she has chantix but never started as not ready, has been on wellbutrin 150 mg with some benefit but stopped taking.  She had benign appearing CT lung cancer screen on 09/04/2019. She does have mild centrolobular emphysema by imaging but denies.   She has ASCVD with bypass in 2002, aortic atherosclerosis per chest CT, followed annually by cardiology Dr. Acie Fredrickson.  She has hx of PVD; recently has reported claudication symptoms of LLE (after 5 weeks) ongoing since last year, ABI was ordered in Feb 2021 but she hasn't pursued due to husband health situation and pandemic, today is agreeable to scheduling this. Will reorder.  She is on statin and plavix.   Her blood pressure has been controlled at home (110-120s/80s), today their BP is BP: 106/70   She does workout occasionally - has seated cycling machine that she does while at her desk. She denies chest pain, shortness of breath, dizziness.    She is on cholesterol medication (crestor 40 mg daily, zetia 10 mg daily) and denies myalgias. Her LDL cholesterol is not at goal (atypically elevated last check).The cholesterol last visit was:   Lab Results  Component Value Date   CHOL 263 (H) 09/07/2019   HDL 35 (L) 09/07/2019   LDLCALC 190 (H) 09/07/2019   TRIG 203 (H) 09/07/2019   CHOLHDL 7.5 (H) 09/07/2019    She has been working on diet and exercise for glucose management (intermittent hx of prediabetes), and denies foot ulcerations, increased appetite, nausea, paresthesia of the feet, polydipsia, polyuria, visual disturbances, vomiting and weight loss.  Last A1C in the office was:  Lab Results  Component Value Date   HGBA1C 5.3 04/20/2019   She is on thyroid medication. Her medication was not changed last visit. 75 mcg daily. Lab Results  Component Value Date   TSH 0.45 10/28/2019    She is  followed by Dr. Corliss Parish for CKD 3/4. She has stopped taking goody powders admits still smoking. She drinks 3-4 bottles of water daily.  Lab Results  Component Value Date   GFRNONAA 23 (L) 09/07/2019   Patient is not on Vitamin D supplement, stopped taking previously after high value and at goal at last check, now has been off completely for several months, previously taking 50000 IU 3 days a week:    Lab Results  Component Value Date   VD25OH 76 09/07/2019        Current Medications:  Current Outpatient Medications on File Prior to Visit  Medication Sig  . clopidogrel (PLAVIX) 75 MG tablet TAKE 1 TABLET BY MOUTH  DAILY TO PREVENT BLOOD  CLOTS  . ezetimibe (ZETIA) 10 MG tablet TAKE 1 TABLET(10 MG) BY MOUTH DAILY  . furosemide (LASIX) 40 MG tablet Take     1 tablet      2 x /day     for BP & Fluid Retention / Ankle Swelling  . labetalol (NORMODYNE) 300 MG tablet TAKE 1 TABLET BY MOUTH TWO  TIMES DAILY  . levETIRAcetam (KEPPRA) 500 MG tablet TAKE 1 TABLET BY MOUTH  TWICE DAILY FOR SEIZURES  . levothyroxine (SYNTHROID) 75 MCG tablet Take  1/2- 1 tablet  Daily as  directed on an empty stomach with only water for 30 minutes & no Antacid meds, Calcium or Magnesium for 4 hours & avoid Biotin (Patient taking differently: every other day. Take  1/2- 1 tablet  Daily as directed on an empty stomach with only water for 30 minutes & no Antacid meds, Calcium or Magnesium for 4 hours & avoid Biotin)  . losartan (COZAAR) 50 MG tablet Take 50 mg by mouth daily.  . rosuvastatin (CRESTOR) 40 MG tablet Take 1 tablet Daily for Cholesterol   No current facility-administered medications on file prior to visit.     Allergies:  Allergies  Allergen Reactions  . Ace Inhibitors Cough  . Penicillins   . Prednisone     Dysphoria     Medical History:  Past Medical History:  Diagnosis Date  . CAD (coronary artery disease)   . CKD (chronic kidney disease) stage 3, GFR 30-59 ml/min (HCC)   .  History of basal cell carcinoma excision 12/20/2017  . Hyperlipidemia LDL goal <70   . Hypertension   . Hypothyroidism   . Tobacco abuse   . Vitamin D deficiency    Family history- Reviewed and unchanged Social history- Reviewed and unchanged   Review of Systems:  Review of Systems  Constitutional: Negative for malaise/fatigue and weight loss.  HENT: Negative for hearing loss and tinnitus.   Eyes: Negative for blurred vision and double vision.  Respiratory: Negative for cough, shortness of breath and wheezing.   Cardiovascular: Positive for claudication (LLE after 5 blocks). Negative for chest pain, palpitations, orthopnea and leg swelling.  Gastrointestinal: Negative for abdominal pain, blood in stool, constipation, diarrhea, heartburn, melena, nausea and vomiting.  Genitourinary: Negative.   Musculoskeletal: Negative for joint pain and myalgias.  Skin: Negative for rash.  Neurological: Negative for dizziness, tingling, sensory change, weakness and headaches.  Endo/Heme/Allergies: Negative for polydipsia.  Psychiatric/Behavioral: Negative.   All other systems reviewed and are negative.    Physical Exam: BP 106/70   Pulse (!) 58   Temp (!) 96.6 F (35.9 C)   Wt 176 lb (79.8 kg)   SpO2 98%   BMI 27.57 kg/m  Wt Readings from Last 3 Encounters:  01/05/20 176 lb (79.8 kg)  09/07/19 166 lb 12.8 oz (75.7 kg)  07/06/19 172 lb 3.2 oz (78.1 kg)   General Appearance: Well nourished, in no apparent distress. Eyes: PERRLA, EOMs, conjunctiva no swelling or erythema Sinuses: No Frontal/maxillary tenderness ENT/Mouth: Ext aud canals clear, TMs without erythema, bulging. No erythema, swelling, or exudate on post pharynx.  Tonsils not swollen or erythematous. Hearing normal.  Neck: Supple, thyroid normal.  Respiratory: Respiratory effort normal, BS equal bilaterally without rales, rhonchi, wheezing or stridor.  Cardio: RRR with harsh 3/6 decreshendo systolic murmur. Thready peripheral  pulses with nonpitting edema to bilateral ankles/lower legs, chronic and unchanged.  Abdomen: Soft, + BS.  Non tender, no guarding, rebound, hernias, masses. Lymphatics: Non tender without lymphadenopathy.  Musculoskeletal: Full ROM, 5/5 strength, Normal gait Skin: Warm, dry; multiple pearly growths to face and neck without erythema or ulceration.  Neuro: Cranial nerves intact. No cerebellar symptoms. Sensation intact.  Psych: Awake and oriented X 3, normal affect, Insight and Judgment appropriate.    Izora Ribas, NP 4:19 PM [facility]

## 2020-01-05 ENCOUNTER — Ambulatory Visit (INDEPENDENT_AMBULATORY_CARE_PROVIDER_SITE_OTHER): Payer: 59 | Admitting: Adult Health

## 2020-01-05 ENCOUNTER — Encounter: Payer: Self-pay | Admitting: Adult Health

## 2020-01-05 ENCOUNTER — Other Ambulatory Visit: Payer: Self-pay

## 2020-01-05 VITALS — BP 106/70 | HR 58 | Temp 96.6°F | Wt 176.0 lb

## 2020-01-05 DIAGNOSIS — F17200 Nicotine dependence, unspecified, uncomplicated: Secondary | ICD-10-CM

## 2020-01-05 DIAGNOSIS — Z91199 Patient's noncompliance with other medical treatment and regimen due to unspecified reason: Secondary | ICD-10-CM

## 2020-01-05 DIAGNOSIS — Z23 Encounter for immunization: Secondary | ICD-10-CM | POA: Diagnosis not present

## 2020-01-05 DIAGNOSIS — I739 Peripheral vascular disease, unspecified: Secondary | ICD-10-CM | POA: Diagnosis not present

## 2020-01-05 DIAGNOSIS — R7989 Other specified abnormal findings of blood chemistry: Secondary | ICD-10-CM

## 2020-01-05 DIAGNOSIS — I10 Essential (primary) hypertension: Secondary | ICD-10-CM

## 2020-01-05 DIAGNOSIS — Z8673 Personal history of transient ischemic attack (TIA), and cerebral infarction without residual deficits: Secondary | ICD-10-CM

## 2020-01-05 DIAGNOSIS — N184 Chronic kidney disease, stage 4 (severe): Secondary | ICD-10-CM

## 2020-01-05 DIAGNOSIS — E039 Hypothyroidism, unspecified: Secondary | ICD-10-CM

## 2020-01-05 DIAGNOSIS — Z9119 Patient's noncompliance with other medical treatment and regimen: Secondary | ICD-10-CM

## 2020-01-05 DIAGNOSIS — E663 Overweight: Secondary | ICD-10-CM

## 2020-01-05 DIAGNOSIS — Z130 Encounter for screening for diseases of the blood and blood-forming organs and certain disorders involving the immune mechanism: Secondary | ICD-10-CM

## 2020-01-05 DIAGNOSIS — I2510 Atherosclerotic heart disease of native coronary artery without angina pectoris: Secondary | ICD-10-CM | POA: Diagnosis not present

## 2020-01-05 DIAGNOSIS — I700 Atherosclerosis of aorta: Secondary | ICD-10-CM

## 2020-01-05 DIAGNOSIS — Z79899 Other long term (current) drug therapy: Secondary | ICD-10-CM

## 2020-01-05 DIAGNOSIS — G40909 Epilepsy, unspecified, not intractable, without status epilepticus: Secondary | ICD-10-CM

## 2020-01-05 DIAGNOSIS — J432 Centrilobular emphysema: Secondary | ICD-10-CM

## 2020-01-05 DIAGNOSIS — D649 Anemia, unspecified: Secondary | ICD-10-CM

## 2020-01-05 DIAGNOSIS — E782 Mixed hyperlipidemia: Secondary | ICD-10-CM

## 2020-01-05 DIAGNOSIS — E559 Vitamin D deficiency, unspecified: Secondary | ICD-10-CM

## 2020-01-05 DIAGNOSIS — N289 Disorder of kidney and ureter, unspecified: Secondary | ICD-10-CM

## 2020-01-05 MED ORDER — FUROSEMIDE 40 MG PO TABS: ORAL_TABLET | ORAL | 3 refills | Status: DC

## 2020-01-05 NOTE — Patient Instructions (Addendum)
Goals    . DIET - INCREASE WATER INTAKE     Aim for 4-5 bottles daily     . LDL CALC < 70    . Quit Smoking    . Weight (lb) < 160 lb (72.6 kg)        Intermittent Claudication Intermittent claudication is pain in one or both legs that occurs when walking or exercising and goes away when resting. Intermittent claudication is a symptom of peripheral arterial disease (PAD). This condition is commonly treated with rest, medicine, and healthy lifestyle changes. If medical management does not improve symptoms, surgery can be done to restore blood flow (revascularization) to the affected leg. What are the causes?  This condition is caused by buildup of fatty material (plaque) within the major arteries in the body (atherosclerosis). Plaque makes arteries stiff and narrow, which prevents enough blood from reaching the leg muscles. Pain occurs when you walk or exercise because your muscles need (but cannot get) more blood when you are moving and exercising. What increases the risk? The following factors may make you more likely to develop this condition:  A family history of atherosclerosis.  A personal history of stroke or heart disease.  Older age.  Being inactive (sedentary lifestyle).  Being overweight.  Smoking cigarettes.  Having another health condition such as: ? Diabetes. ? High blood pressure. ? High cholesterol. What are the signs or symptoms? Symptoms of this condition may first develop in the lower leg, and then they may spread to the thigh, hip, buttock, or the back of the lower leg (calf) over time. Symptoms may include:  Aches or pains.  Cramps.  A feeling of tightness, weakness, or heaviness.  A wound on the lower leg or foot that heals poorly or does not heal. How is this diagnosed? This condition may be diagnosed based on:  Your symptoms.  Your medical history.  Tests, such as: ? Blood tests. ? Arterial duplex ultrasound. This test uses images of blood  vessels and surrounding organs to evaluate blood flow within arteries. ? Angiogram. In this procedure, dye is injected into arteries and then X-rays are taken. ? Magnetic resonance angiogram (MRA). In this procedure, strong magnets and radio waves are used instead of X-rays to create images of blood vessels and blood flow. ? CT angiogram (CTA). In this procedure, a large X-ray machine called a CT scanner takes detailed pictures of blood vessels that have been injected with dye. ? Ankle-brachial index (ABI) test. This procedure measures blood pressure in the leg during exercise and at rest. ? Exercise test. For this test, you will walk on a treadmill while tests are done (such as the ABI test) to evaluate how this condition affects your ability to walk or exercise. How is this treated? Treatment for this condition may involve treatment for the underlying cause, such as treatment for high blood pressure, high cholesterol, or diabetes. Treatment may include:  Lifestyle changes such as: ? Starting a supervised or home-based exercise program. ? Losing weight. ? Quitting smoking.  Medicines to help restore blood flow through your legs.  Blood vessel surgery (angioplasty) to restore blood flow around the blocked vessel. This is also known as endovascular therapy (EVT). This is only done if your intermittent claudication is caused by severe peripheral artery disease, a condition in which blood flow is severely or totally restricted by the narrowing of the arteries. Follow these instructions at home: Lifestyle   Maintain a healthy weight.  Eat a diet  that is low in saturated fats and calories. Consider working with a diet and nutrition specialist (dietitian) to help you make healthy food choices.  Do not use any products that contain nicotine or tobacco, such as cigarettes and e-cigarettes. If you need help quitting, ask your health care provider.  If your health care provider recommended an  exercise program for you, follow it as directed. Your exercise program may involve: ? Walking 3 or more times a week. ? Walking until you have certain symptoms of intermittent claudication. ? Resting until symptoms go away. ? Gradually increasing your walking time to about 50 minutes a day. General instructions  Work with your health care provider to manage any other health conditions you may have, including diabetes, high blood pressure, or high cholesterol.  Take over-the-counter and prescription medicines only as told by your health care provider.  Keep all follow-up visits as told by your health care provider. This is important. Contact a health care provider if:  Your pain does not go away with rest.  You have sores on your legs that do not heal or have a bad smell or pus coming from them.  Your condition gets worse or does not get better with treatment. Get help right away if:  You have chest pain.  You have difficulty breathing.  You develop arm weakness.  You have trouble speaking.  Your face begins to droop.  Your foot or leg is cold or it changes color.  Your foot or leg becomes numb. These symptoms may represent a serious problem that is an emergency. Do not wait to see if the symptoms will go away. Get medical help right away. Call your local emergency services (911 in the [place] Do not drive yourself to the hospital.  Summary  Intermittent claudication is pain in one or both legs that occurs when walking or exercising and goes away when resting.  This condition is caused by buildup of fatty material (plaque) within the major arteries in the body (atherosclerosis). Plaque makes arteries stiff and narrow, which prevents enough blood from reaching the leg muscles.  Intermittent claudication can be treated with medicine and lifestyle changes. If medical treatment fails, surgery can be done to help return blood flow to the affected area.  Make sure you work with  your health care provider to manage any other health conditions you may have, including diabetes, high blood pressure, or high cholesterol. This information is not intended to replace advice given to you by your health care provider. Make sure you discuss any questions you have with your health care provider. Document Revised: 01/25/2017 Document Reviewed: 03/15/2016 Elsevier Patient Education  2020 [employer].

## 2020-01-06 ENCOUNTER — Other Ambulatory Visit: Payer: Self-pay | Admitting: Adult Health

## 2020-01-06 DIAGNOSIS — E039 Hypothyroidism, unspecified: Secondary | ICD-10-CM

## 2020-01-06 MED ORDER — LEVOTHYROXINE SODIUM 75 MCG PO TABS: ORAL_TABLET | ORAL | 0 refills | Status: DC

## 2020-01-06 NOTE — Addendum Note (Signed)
Addended by: Izora Ribas on: 01/06/2020 08:21 AM   Modules accepted: Orders

## 2020-01-07 ENCOUNTER — Encounter: Payer: Self-pay | Admitting: Adult Health

## 2020-01-07 LAB — COMPLETE METABOLIC PANEL WITHOUT GFR
AG Ratio: 2 (calc) (ref 1.0–2.5)
ALT: 80 U/L — ABNORMAL HIGH (ref 6–29)
AST: 73 U/L — ABNORMAL HIGH (ref 10–35)
Albumin: 4.1 g/dL (ref 3.6–5.1)
Alkaline phosphatase (APISO): 74 U/L (ref 37–153)
BUN/Creatinine Ratio: 20 (calc) (ref 6–22)
BUN: 60 mg/dL — ABNORMAL HIGH (ref 7–25)
CO2: 25 mmol/L (ref 20–32)
Calcium: 8.9 mg/dL (ref 8.6–10.4)
Chloride: 102 mmol/L (ref 98–110)
Creat: 3.03 mg/dL — ABNORMAL HIGH (ref 0.50–0.99)
GFR, Est African American: 18 mL/min/{1.73_m2} — ABNORMAL LOW
GFR, Est Non African American: 16 mL/min/{1.73_m2} — ABNORMAL LOW
Globulin: 2.1 g/dL (ref 1.9–3.7)
Glucose, Bld: 104 mg/dL — ABNORMAL HIGH (ref 65–99)
Potassium: 4.7 mmol/L (ref 3.5–5.3)
Sodium: 138 mmol/L (ref 135–146)
Total Bilirubin: 0.3 mg/dL (ref 0.2–1.2)
Total Protein: 6.2 g/dL (ref 6.1–8.1)

## 2020-01-07 LAB — MAGNESIUM: Magnesium: 2.4 mg/dL (ref 1.5–2.5)

## 2020-01-07 LAB — CBC WITH DIFFERENTIAL/PLATELET
Absolute Monocytes: 663 cells/uL (ref 200–950)
Basophils Absolute: 50 cells/uL (ref 0–200)
Basophils Relative: 0.8 %
Eosinophils Absolute: 310 cells/uL (ref 15–500)
Eosinophils Relative: 5 %
HCT: 29.3 % — ABNORMAL LOW (ref 35.0–45.0)
Hemoglobin: 9.6 g/dL — ABNORMAL LOW (ref 11.7–15.5)
Lymphs Abs: 1996 cells/uL (ref 850–3900)
MCH: 31.2 pg (ref 27.0–33.0)
MCHC: 32.8 g/dL (ref 32.0–36.0)
MCV: 95.1 fL (ref 80.0–100.0)
MPV: 10.6 fL (ref 7.5–12.5)
Monocytes Relative: 10.7 %
Neutro Abs: 3181 cells/uL (ref 1500–7800)
Neutrophils Relative %: 51.3 %
Platelets: 166 10*3/uL (ref 140–400)
RBC: 3.08 10*6/uL — ABNORMAL LOW (ref 3.80–5.10)
RDW: 12.7 % (ref 11.0–15.0)
Total Lymphocyte: 32.2 %
WBC: 6.2 10*3/uL (ref 3.8–10.8)

## 2020-01-07 LAB — TSH: TSH: 0.3 m[IU]/L — ABNORMAL LOW (ref 0.40–4.50)

## 2020-01-07 LAB — TEST AUTHORIZATION

## 2020-01-07 LAB — LEVETIRACETAM LEVEL: Keppra (Levetiracetam): 39.5 ug/mL (ref 12.0–46.0)

## 2020-01-07 LAB — LIPID PANEL
Cholesterol: 90 mg/dL (ref <200)
HDL: 28 mg/dL — ABNORMAL LOW (ref >=50)
LDL Cholesterol (Calc): 41 mg/dL (calc)
Non-HDL Cholesterol (Calc): 62 mg/dL (calc) (ref <130)
Total CHOL/HDL Ratio: 3.2 (calc) (ref <5.0)
Triglycerides: 130 mg/dL (ref <150)

## 2020-01-07 LAB — IRON,TIBC AND FERRITIN PANEL
%SAT: 22 % (ref 16–45)
Ferritin: 140 ng/mL (ref 16–288)
Iron: 62 ug/dL (ref 45–160)
TIBC: 285 ug/dL (ref 250–450)

## 2020-01-18 ENCOUNTER — Other Ambulatory Visit: Payer: Self-pay

## 2020-01-18 MED ORDER — EZETIMIBE 10 MG PO TABS: ORAL_TABLET | ORAL | 1 refills | Status: DC

## 2020-01-25 ENCOUNTER — Ambulatory Visit (HOSPITAL_COMMUNITY)
Admission: RE | Admit: 2020-01-25 | Payer: 59 | Source: Ambulatory Visit | Attending: Adult Health | Admitting: Adult Health

## 2020-02-02 ENCOUNTER — Ambulatory Visit (HOSPITAL_COMMUNITY): Payer: 59

## 2020-02-07 ENCOUNTER — Other Ambulatory Visit: Payer: Self-pay | Admitting: Adult Health

## 2020-02-07 DIAGNOSIS — R569 Unspecified convulsions: Secondary | ICD-10-CM

## 2020-02-11 ENCOUNTER — Other Ambulatory Visit: Payer: Self-pay | Admitting: Internal Medicine

## 2020-02-11 DIAGNOSIS — E785 Hyperlipidemia, unspecified: Secondary | ICD-10-CM

## 2020-02-14 ENCOUNTER — Other Ambulatory Visit: Payer: Self-pay | Admitting: Internal Medicine

## 2020-02-15 ENCOUNTER — Other Ambulatory Visit: Payer: Self-pay | Admitting: Internal Medicine

## 2020-02-15 DIAGNOSIS — E039 Hypothyroidism, unspecified: Secondary | ICD-10-CM

## 2020-02-15 MED ORDER — LEVOTHYROXINE SODIUM 75 MCG PO TABS: ORAL_TABLET | ORAL | 0 refills | Status: DC

## 2020-02-16 ENCOUNTER — Other Ambulatory Visit: Payer: Self-pay | Admitting: Internal Medicine

## 2020-02-16 DIAGNOSIS — E039 Hypothyroidism, unspecified: Secondary | ICD-10-CM

## 2020-02-16 MED ORDER — LEVOTHYROXINE SODIUM 75 MCG PO TABS: ORAL_TABLET | ORAL | 0 refills | Status: DC

## 2020-04-19 ENCOUNTER — Encounter: Payer: 59 | Admitting: Adult Health

## 2020-04-30 ENCOUNTER — Other Ambulatory Visit: Payer: Self-pay | Admitting: Internal Medicine

## 2020-04-30 DIAGNOSIS — E785 Hyperlipidemia, unspecified: Secondary | ICD-10-CM

## 2020-05-03 NOTE — Progress Notes (Unsigned)
Complete Physical  Assessment and Plan:  Diagnoses and all orders for this visit:  Encounter for routine physical examination with abnormal findings Declines PAP, she agrees to CT lung cancer screening, cologuard, she will schedule mammogram  Atherosclerosis of aorta Per CT 07/2018 Control blood pressure, cholesterol, glucose, increase exercise.   Essential hypertension Continue medication Monitor blood pressure at home; call if consistently over 130/80 Continue to recommend DASH diet.   Reminder to go to the ER if any CP, SOB, nausea, dizziness, severe HA, changes vision/speech, left arm numbness and tingling and jaw pain.  Atherosclerosis of native coronary artery of native heart without angina pectoris Encouraged to schedule overdue cardiology follow up  Quit smoking - patient unwilling Continue plavix EKG ordered today is stable from previous; denies cardiac sx  Hx of CVA Recommended she quit smoking, continue plavix, statin.   Hypothyroidism, unspecified type continue medications the same reminded to take on an empty stomach 30-70mins before food.  -     TSH  Seizure disorder ([place]) Continue keppra; recommended she quit smoking but unwilling at this time. Monitor levels.  Refer back to [facility] neuro if any changes; last in 2012 per patient   Stage 4 CKD  Followed by nephrology - [facility] -     CMP/GFR -     Urinalysis, Complete (81001) -     Microalbumin / creatinine urine ratio  Anemia due to CKD [facility]) Nephrology is managing -  Check CBC  Hyperlipidemia Continue medications LDL goal <70 Continue to recommend low cholesterol diet and exercise.  -     Lipid panel  SMOKER Continues to smoke 0.5 ppd- 45+ pack year history instruction/counseling given, counseled patient on the dangers of tobacco use, advised patient to stop smoking, and reviewed strategies to maximize success, Patient is not ready to quit - Discussed low dose screening CT and will  proceed with patient's permission, due 08/2020  Emphysema ([place]) Per imaging, recommended stop smoking Denies sx; monitor   Vitamin D deficiency Continue supplementation -     VITAMIN D 25 Hydroxy (Vit-D Deficiency, Fractures)  Screening for diabetes Discussed disease and risks Discussed diet/exercise, weight management  -     Hemoglobin A1c  Medication management -     CBC with Differential/Platelet -     CMP/GFR -     Magnesium   Overweight Long discussion about weight loss, diet, and exercise Recommended diet heavy in fruits and veggies and low in animal meats, cheeses, and dairy products, appropriate calorie intake Discussed appropriate weight for height  Follow up at next visit  Screening for colorectal cancer Colonoscopy- patient declines a colonoscopy even though the risks and benefits were discussed at length. Colon cancer is 3rd most diagnosed cancer and 2nd leading cause of death in both men and women 1 years of age and older. Patient understands the risk of cancer and death with declining the test however they are willing to do cologuard screening instead. They understand that this is not as sensitive or specific as a colonoscopy and they are still recommended to get a colonoscopy. She has cologuard test from last year, encouraged to place in bathroom, will call frequently to follow up.   Peripheral vascular disease ([place]) Continue aggressive treatment of cholesterol Continue plavix Continue cardiology follow up - OVERDUE -   PVD/Diminished pulses/LLE claudication - ABI ordered and scheduled later this month Control blood pressure, cholesterol, glucose, increase exercise, on plavix   Hx of BCC/ Atypical nevus of R lower  back  Overdue follow up with derm, poor follow through  Erythematous/inflamed, recommend biopsy and pathology Can schedule here ASAP within the next 2 weeks  Poor follow through  Reviewed overdue tests with patient; she agrees to complete  cologuard, schedule mammogram Will continue close follow up q60m visits, call to verify progress in a few weeks   Discussed med's effects and SE's. Screening labs and tests as requested with regular follow-up as recommended. Over 40 minutes of exam, counseling, chart review, and complex, high level critical decision making was performed this visit.   Future Appointments  Date Time Provider [place]  05/09/2020  4:00 PM [name], MD [facility] None  05/19/2020  2:00 PM [facility] NL VASC 4 [facility]  09/08/2020  3:30 PM Liane Comber, NP [facility] None  05/04/2021  2:00 PM Liane Comber, NP [facility] None     HPI  60 y.o. Caucasian married female,  - presents for a complete physical. She has Hypothyroidism; Hyperlipidemia; SMOKER; Essential hypertension; CAD (coronary artery disease); History of CVA (cerebrovascular accident); Peripheral vascular disease ([place]); CKD (chronic kidney disease) stage 4, GFR 15-29 ml/min ([place]); Seizure disorder ([place]); Vitamin D deficiency; Poor compliance; Medication management; Overweight (BMI 25.0-29.9); Anemia in chronic kidney disease; Claudication of left lower extremity ([place]); Emphysema of lung ([place]); Aortic atherosclerosis ([place]); Cholelithiases; Atypical nevus of right lower back; and History of basal cell carcinoma (BCC) on their problem list.   Married, works from home for [employer]. No children. Has a dog (5 lb mico yorkie).   No concerns today.   Hx of ruptured cerebral aneurysm in 2005 with repeated repair in 2012, hx of seizure order on keppra - no recent seizures - last in 2008-  Formerly followed by [facility] Neurology, now following keppra levels here in office.   She is followed by Nephrology Dr. Corliss Parish due to progression to CKD IV.  she currently continues to smoke 0.5 pack a day; 45+ pack year history; discussed risks associated with smoking at length, patient is not ready to quit. She states she is very  aware of risks and has quit in the past. She is aware that she needs to make this decision for her health. She has failed numerous attempts with Chantix. Down from 1.5-2 packs to 0.5 pack daily. Last CT 08/2019 showed no concerning nodules, did show emphysemia and aortic atherosclerosis. Recommended continued annual screening.   BMI is Body mass index is 27.57 kg/m., she has been working on diet and exercise. She has a pedal exerciser at her desk and uses daily - makes small healthy choices.  Wt Readings from Last 3 Encounters:  05/04/20 176 lb (79.8 kg)  01/05/20 176 lb (79.8 kg)  09/07/19 166 lb 12.8 oz (75.7 kg)   She has ASCVD with bypass in 2002, followed annually by cardiology Dr. Acie Fredrickson last 2019, overdue.   She has hx of PVD, on statin and plavix, has been reporting aching in LLE after 5 blocks, ABI has been ordered and scheduled later this month. She has atherosclerosis of the aorta per CT 07/2018.  Her blood pressure has been controlled at home, today their BP is BP: 104/68 She does workout- cycler at her desk.   She denies chest pain, shortness of breath, dizziness.   She is on cholesterol medication (rosuvastatin 40 mg daily, zetia 10 mg daily) and denies myalgias. Her cholesterol is at goal. The cholesterol last visit was:   Lab Results  Component Value Date   CHOL 90  01/05/2020   HDL 28 (L) 01/05/2020   LDLCALC 41 01/05/2020   TRIG 130 01/05/2020   CHOLHDL 3.2 01/05/2020   She has been working on diet and exercise for glucose management, she is not on bASA (on plavix), she is on ACE/ARB and denies increased appetite, nausea, paresthesia of the feet, polydipsia, polyuria, visual disturbances and vomiting. Last A1C in the office was:  Lab Results  Component Value Date   HGBA1C 5.3 04/20/2019   She has CKD IV and is following with Dr. Corliss Parish:  Lab Results  Component Value Date   GFRNONAA 16 (L) 01/05/2020   Also anemia felt r/t CKD  Lab Results  Component  Value Date   WBC 6.2 01/05/2020   HGB 9.6 (L) 01/05/2020   HCT 29.3 (L) 01/05/2020   MCV 95.1 01/05/2020   PLT 166 01/05/2020   She is on thyroid medication. Her medication was not changed last visit. Taking 75 mcg 1/2 tab daily.  Lab Results  Component Value Date   TSH 0.30 (L) 01/05/2020   Patient is on Vitamin D supplement, taking 50000 IU every other day.  Lab Results  Component Value Date   VD25OH 76 09/07/2019       Current Medications:  Current Outpatient Medications on File Prior to Visit  Medication Sig Dispense Refill  . clopidogrel (PLAVIX) 75 MG tablet TAKE 1 TABLET BY MOUTH  DAILY TO PREVENT BLOOD  CLOTS 90 tablet 3  . ezetimibe (ZETIA) 10 MG tablet TAKE 1 TABLET(10 MG) BY MOUTH DAILY 90 tablet 1  . furosemide (LASIX) 40 MG tablet Take      1 tablet       2 x /day       for BP & Fluid Retention / Ankle Swelling 180 tablet 0  . labetalol (NORMODYNE) 300 MG tablet TAKE 1 TABLET BY MOUTH TWO  TIMES DAILY 180 tablet 0  . levETIRAcetam (KEPPRA) 500 MG tablet Take     1 tablet        2 x /day       to prevent Seizures 180 tablet 0  . levothyroxine (SYNTHROID) 75 MCG tablet Take 1/2 tablet daily except skip Sunday. Take an empty stomach with only water for 30 minutes & no Antacid meds, Calcium or Magnesium for 4 hours & avoid Biotin 90 tablet 0  . losartan (COZAAR) 50 MG tablet Take 50 mg by mouth daily.    . rosuvastatin (CRESTOR) 40 MG tablet TAKE 1 TABLET BY MOUTH  DAILY FOR CHOLESTEROL 90 tablet 3   No current facility-administered medications on file prior to visit.   Allergies:  Allergies  Allergen Reactions  . Ace Inhibitors Cough  . Penicillins   . Prednisone     Dysphoria   Medical History:  She has Hypothyroidism; Hyperlipidemia; SMOKER; Essential hypertension; CAD (coronary artery disease); History of CVA (cerebrovascular accident); Peripheral vascular disease ([place]); CKD (chronic kidney disease) stage 4, GFR 15-29 ml/min ([place]); Seizure disorder ([place]);  Vitamin D deficiency; Poor compliance; Medication management; Overweight (BMI 25.0-29.9); Anemia in chronic kidney disease; Claudication of left lower extremity ([place]); Emphysema of lung ([place]); Aortic atherosclerosis ([place]); Cholelithiases; Atypical nevus of right lower back; and History of basal cell carcinoma (BCC) on their problem list. Health Maintenance:   Immunization History  Administered Date(s) Administered  . Influenza Inj Mdck Quad With Preservative 01/31/2017, 12/23/2017, 01/05/2020  . Influenza Split 12/02/2014  . Influenza,inj,quad, With Preservative 12/20/2015  . PFIZER(Purple Top)SARS-COV-2 Vaccination 07/13/2019, 08/07/2019, 04/06/2020  .  PPD Test 10/09/2013, 12/02/2014  . Td 10/24/2006, 01/31/2017   Tetanus: 2018 Flu vaccine: 02/2019  Pap: Remote - DECLINES - discussed risks, if patient changes her mind she may request at regular visit MGM: 06/2019, will schedule  DEXA: get age 69  Colonoscopy: DUE - declines - agrees to cologuard but poor follow through - patient states never got, reordered last year, still hasn't completed, requests reorder CXR: 2015 CT chest: 08/2019, emphysema, aortic atherosclerosis CT 2020 showed cholelithiasis  Last Dental Exam: Remote - needs top teeth out - she is planning to see an oral surgeon Last Eye Exam: Annually, last visit 2020, had cataracts out, doing well    Patient Care Team: [name], MD as PCP - General (Internal Medicine) Nahser, Wonda Cheng, MD as PCP - Cardiology (Cardiology) Corliss Parish, MD as Consulting Physician (Nephrology)  Surgical History:  She has a past surgical history that includes Coronary artery bypass graft (2002); Embolization (2005); Cerebral aneurysm repair (Right, 2012); Cerebral aneurysm repair (Right, 2005); and Cataract extraction, bilateral (Bilateral, 2020). Family History:  Herfamily history includes Brain cancer in her father; Heart attack in her maternal grandfather; Heart attack (age of  onset: 24) in her brother; Heart attack (age of onset: 48) in her mother; Heart disease in her brother and mother; Heart disease (age of onset: 9) in her brother; Leukemia (age of onset: 15) in her sister; Multiple sclerosis in her sister. Social History:  She reports that she has been smoking cigarettes. She started smoking about 47 years ago. She has a 46.00 pack-year smoking history. She has never used smokeless tobacco. She reports that she does not drink alcohol and does not use drugs.  Review of Systems: Review of Systems  Constitutional: Negative for malaise/fatigue and weight loss.  HENT: Negative for hearing loss and tinnitus.   Eyes: Negative for blurred vision and double vision.  Respiratory: Negative for cough, shortness of breath and wheezing.   Cardiovascular: Positive for claudication (aching, cramping in L leg with ambulation). Negative for chest pain, palpitations, orthopnea and leg swelling.  Gastrointestinal: Negative for abdominal pain, blood in stool, constipation, diarrhea, heartburn, melena, nausea and vomiting.  Genitourinary: Negative.   Musculoskeletal: Negative for joint pain and myalgias.  Skin: Negative for rash.  Neurological: Negative for dizziness, tingling (soles of feet intermittently ), sensory change, weakness and headaches.  Endo/Heme/Allergies: Positive for environmental allergies (She reports mild symptoms - uses OTC medications rarely as needed). Negative for polydipsia.  Psychiatric/Behavioral: Negative.   All other systems reviewed and are negative.   Physical Exam: Estimated body mass index is 27.57 kg/m as calculated from the following:   Height as of this encounter: 5\' 7"  (1.702 m).   Weight as of this encounter: 176 lb (79.8 kg). BP 104/68   Pulse 60   Temp (!) 97.3 F (36.3 C)   Ht 5\' 7"  (1.702 m)   Wt 176 lb (79.8 kg)   SpO2 99%   BMI 27.57 kg/m  General Appearance: Well nourished, in no apparent distress.  Eyes: PERRLA, EOMs,  conjunctiva no swelling or erythema.  Sinuses: No Frontal/maxillary tenderness  ENT/Mouth: Ext aud canals clear, normal light reflex with TMs without erythema, bulging. Extremely poor dentition - broken/missing/black teeth. NO ulcers, glands normal to palpation. No erythema, swelling, or exudate on post pharynx. Tonsils not swollen or erythematous. Hearing normal.  Neck: Supple, thyroid normal. No bruits  Respiratory: Respiratory effort normal, BS present but somewhat coarse throughout; no wheezing, stridor.  Cardio: RRR without murmurs,  rubs or gallops. Thready symmetrical peripheral pulses with mild non-pitting edema  Chest: symmetric, with normal excursions and percussion.  Breasts: Declines today  Abdomen: Soft, nontender, no guarding, rebound, hernias, masses, or organomegaly.  Lymphatics: Non tender without lymphadenopathy.  Genitourinary: Patient declines Musculoskeletal: Full ROM all peripheral extremities,5/5 strength, and normal gait.  Skin: Warm, dry; she has approx 1.5 cm x 1 cm raised, erythematous, irregular, flaking skin lesion to R lower back  Neuro: Cranial nerves intact, reflexes equal bilaterally. Normal muscle tone, no cerebellar symptoms. Sensation intact.  Psych: Awake and oriented X 3, normal affect, Insight and Judgment appropriate.       EKG: Sinus brady, NSCPT   Gorden Harms [place] 6:17 PM [facility]

## 2020-05-04 ENCOUNTER — Other Ambulatory Visit: Payer: Self-pay

## 2020-05-04 ENCOUNTER — Encounter: Payer: Self-pay | Admitting: Adult Health

## 2020-05-04 ENCOUNTER — Ambulatory Visit (INDEPENDENT_AMBULATORY_CARE_PROVIDER_SITE_OTHER): Payer: 59 | Admitting: Adult Health

## 2020-05-04 VITALS — BP 104/68 | HR 60 | Temp 97.3°F | Ht 67.0 in | Wt 176.0 lb

## 2020-05-04 DIAGNOSIS — I700 Atherosclerosis of aorta: Secondary | ICD-10-CM

## 2020-05-04 DIAGNOSIS — E559 Vitamin D deficiency, unspecified: Secondary | ICD-10-CM

## 2020-05-04 DIAGNOSIS — G40909 Epilepsy, unspecified, not intractable, without status epilepticus: Secondary | ICD-10-CM

## 2020-05-04 DIAGNOSIS — I10 Essential (primary) hypertension: Secondary | ICD-10-CM | POA: Diagnosis not present

## 2020-05-04 DIAGNOSIS — I739 Peripheral vascular disease, unspecified: Secondary | ICD-10-CM

## 2020-05-04 DIAGNOSIS — Z9119 Patient's noncompliance with other medical treatment and regimen: Secondary | ICD-10-CM

## 2020-05-04 DIAGNOSIS — E663 Overweight: Secondary | ICD-10-CM

## 2020-05-04 DIAGNOSIS — Z91199 Patient's noncompliance with other medical treatment and regimen due to unspecified reason: Secondary | ICD-10-CM

## 2020-05-04 DIAGNOSIS — N184 Chronic kidney disease, stage 4 (severe): Secondary | ICD-10-CM

## 2020-05-04 DIAGNOSIS — Z0000 Encounter for general adult medical examination without abnormal findings: Secondary | ICD-10-CM | POA: Diagnosis not present

## 2020-05-04 DIAGNOSIS — E039 Hypothyroidism, unspecified: Secondary | ICD-10-CM

## 2020-05-04 DIAGNOSIS — Z136 Encounter for screening for cardiovascular disorders: Secondary | ICD-10-CM

## 2020-05-04 DIAGNOSIS — Z131 Encounter for screening for diabetes mellitus: Secondary | ICD-10-CM

## 2020-05-04 DIAGNOSIS — Z85828 Personal history of other malignant neoplasm of skin: Secondary | ICD-10-CM | POA: Insufficient documentation

## 2020-05-04 DIAGNOSIS — E782 Mixed hyperlipidemia: Secondary | ICD-10-CM

## 2020-05-04 DIAGNOSIS — F17200 Nicotine dependence, unspecified, uncomplicated: Secondary | ICD-10-CM

## 2020-05-04 DIAGNOSIS — K8020 Calculus of gallbladder without cholecystitis without obstruction: Secondary | ICD-10-CM

## 2020-05-04 DIAGNOSIS — I2510 Atherosclerotic heart disease of native coronary artery without angina pectoris: Secondary | ICD-10-CM | POA: Diagnosis not present

## 2020-05-04 DIAGNOSIS — Z1211 Encounter for screening for malignant neoplasm of colon: Secondary | ICD-10-CM

## 2020-05-04 DIAGNOSIS — J432 Centrilobular emphysema: Secondary | ICD-10-CM

## 2020-05-04 DIAGNOSIS — Z79899 Other long term (current) drug therapy: Secondary | ICD-10-CM

## 2020-05-04 DIAGNOSIS — Z8673 Personal history of transient ischemic attack (TIA), and cerebral infarction without residual deficits: Secondary | ICD-10-CM

## 2020-05-04 DIAGNOSIS — D225 Melanocytic nevi of trunk: Secondary | ICD-10-CM

## 2020-05-04 DIAGNOSIS — D631 Anemia in chronic kidney disease: Secondary | ICD-10-CM

## 2020-05-04 NOTE — Patient Instructions (Addendum)
Olivia Walsh , Thank you for taking time to come for your Annual Wellness Visit. I appreciate your ongoing commitment to your health goals. Please review the following plan we discussed and let me know if I can assist you in the future.   These are the goals we discussed: Goals    . DIET - INCREASE WATER INTAKE     Aim for 4-5 bottles daily     . LDL CALC < 70    . Quit Smoking    . Weight (lb) < 160 lb (72.6 kg)       This is a list of the screening recommended for you and due dates:  Health Maintenance  Topic Date Due  . Pap Smear  Never done  . Cologuard (Stool DNA test)  Never done  . COVID-19 Vaccine (4 - Booster for Pfizer series) 10/04/2020  . Mammogram  06/30/2021  . Tetanus Vaccine  02/01/2027  . Flu Shot  Completed  .  Hepatitis C: One time screening is recommended by Center for Disease Control  (CDC) for  adults born from [birth year] through 1967.   Completed  . HIV Screening  Completed  . HPV Vaccine  Aged Out   Please complete cologuard ASAP -   Please schedule mammogram May 2022     Know what a healthy weight is for you (roughly BMI <25) and aim to maintain this  Aim for 7+ servings of fruits and vegetables daily  65-80+ fluid ounces of water or unsweet tea for healthy kidneys  Limit to max 1 drink of alcohol per day; avoid smoking/tobacco  Limit animal fats in diet for cholesterol and heart health - choose grass fed whenever available  Avoid highly processed foods, and foods high in saturated/trans fats  Aim for low stress - take time to unwind and care for your mental health  Aim for 150 min of moderate intensity exercise weekly for heart health, and weights twice weekly for bone health  Aim for 7-9 hours of sleep daily      SMOKING CESSATION  American cancer society  16606301601 for more information or for a free program for smoking cessation help.   You can call QUIT SMART 1-800-QUIT-NOW for free nicotine patches or replacement therapy- if they  are out- keep calling  [facility] Can call for smoking cessation classes, [phone]  If you have a smart phone, please look up Smoke Free app, this will help you stay on track and give you information about money you have saved, life that you have gained back and a ton of more information.     ADVANTAGES OF QUITTING SMOKING  Within 20 minutes, blood pressure decreases. Your pulse is at normal level.  After 8 hours, carbon monoxide levels in the blood return to normal. Your oxygen level increases.  After 24 hours, the chance of having a heart attack starts to decrease. Your breath, hair, and body stop smelling like smoke.  After 48 hours, damaged nerve endings begin to recover. Your sense of taste and smell improve.  After 72 hours, the body is virtually free of nicotine. Your bronchial tubes relax and breathing becomes easier.  After 2 to 12 weeks, lungs can hold more air. Exercise becomes easier and circulation improves.  After 1 year, the risk of coronary heart disease is cut in half.  After 5 years, the risk of stroke falls to the same as a nonsmoker.  After 10 years, the risk of lung cancer is  cut in half and the risk of other cancers decreases significantly.  After 15 years, the risk of coronary heart disease drops, usually to the level of a nonsmoker.  You will have extra money to spend on things other than cigarettes.

## 2020-05-07 LAB — URINALYSIS, ROUTINE W REFLEX MICROSCOPIC
Bilirubin Urine: NEGATIVE
Glucose, UA: NEGATIVE
Hgb urine dipstick: NEGATIVE
Ketones, ur: NEGATIVE
Leukocytes,Ua: NEGATIVE
Nitrite: NEGATIVE
Protein, ur: NEGATIVE
Specific Gravity, Urine: 1.006 (ref 1.001–1.03)
pH: <=5 (ref 5.0–8.0)

## 2020-05-07 LAB — COMPLETE METABOLIC PANEL WITH GFR
AG Ratio: 1.8 (calc) (ref 1.0–2.5)
ALT: 36 U/L — ABNORMAL HIGH (ref 6–29)
AST: 36 U/L — ABNORMAL HIGH (ref 10–35)
Albumin: 4.2 g/dL (ref 3.6–5.1)
Alkaline phosphatase (APISO): 78 U/L (ref 37–153)
BUN/Creatinine Ratio: 17 (calc) (ref 6–22)
BUN: 39 mg/dL — ABNORMAL HIGH (ref 7–25)
CO2: 29 mmol/L (ref 20–32)
Calcium: 8.8 mg/dL (ref 8.6–10.4)
Chloride: 101 mmol/L (ref 98–110)
Creat: 2.26 mg/dL — ABNORMAL HIGH (ref 0.50–0.99)
GFR, Est African American: 26 mL/min/{1.73_m2} — ABNORMAL LOW (ref >=60)
GFR, Est Non African American: 23 mL/min/{1.73_m2} — ABNORMAL LOW (ref >=60)
Globulin: 2.3 g/dL (calc) (ref 1.9–3.7)
Glucose, Bld: 92 mg/dL (ref 65–99)
Potassium: 4.1 mmol/L (ref 3.5–5.3)
Sodium: 139 mmol/L (ref 135–146)
Total Bilirubin: 0.3 mg/dL (ref 0.2–1.2)
Total Protein: 6.5 g/dL (ref 6.1–8.1)

## 2020-05-07 LAB — CBC WITH DIFFERENTIAL/PLATELET
Absolute Monocytes: 539 cells/uL (ref 200–950)
Basophils Absolute: 62 cells/uL (ref 0–200)
Basophils Relative: 1 %
Eosinophils Absolute: 391 cells/uL (ref 15–500)
Eosinophils Relative: 6.3 %
HCT: 33.1 % — ABNORMAL LOW (ref 35.0–45.0)
Hemoglobin: 10.9 g/dL — ABNORMAL LOW (ref 11.7–15.5)
Lymphs Abs: 1600 cells/uL (ref 850–3900)
MCH: 32.6 pg (ref 27.0–33.0)
MCHC: 32.9 g/dL (ref 32.0–36.0)
MCV: 99.1 fL (ref 80.0–100.0)
MPV: 10.8 fL (ref 7.5–12.5)
Monocytes Relative: 8.7 %
Neutro Abs: 3608 cells/uL (ref 1500–7800)
Neutrophils Relative %: 58.2 %
Platelets: 154 10*3/uL (ref 140–400)
RBC: 3.34 10*6/uL — ABNORMAL LOW (ref 3.80–5.10)
RDW: 11.9 % (ref 11.0–15.0)
Total Lymphocyte: 25.8 %
WBC: 6.2 10*3/uL (ref 3.8–10.8)

## 2020-05-07 LAB — HEMOGLOBIN A1C
Hgb A1c MFr Bld: 5.1 % of total Hgb (ref <5.7)
Mean Plasma Glucose: 100 mg/dL
eAG (mmol/L): 5.5 mmol/L

## 2020-05-07 LAB — MICROALBUMIN / CREATININE URINE RATIO
Creatinine, Urine: 24 mg/dL (ref 20–275)
Microalb Creat Ratio: 21 mcg/mg creat (ref <30)
Microalb, Ur: 0.5 mg/dL

## 2020-05-07 LAB — LEVETIRACETAM LEVEL: Keppra (Levetiracetam): 40.3 ug/mL (ref 12.0–46.0)

## 2020-05-07 LAB — LIPID PANEL
Cholesterol: 97 mg/dL (ref <200)
HDL: 32 mg/dL — ABNORMAL LOW (ref >=50)
LDL Cholesterol (Calc): 43 mg/dL (calc)
Non-HDL Cholesterol (Calc): 65 mg/dL (calc) (ref <130)
Total CHOL/HDL Ratio: 3 (calc) (ref <5.0)
Triglycerides: 135 mg/dL (ref <150)

## 2020-05-07 LAB — MAGNESIUM: Magnesium: 2.1 mg/dL (ref 1.5–2.5)

## 2020-05-07 LAB — VITAMIN D 25 HYDROXY (VIT D DEFICIENCY, FRACTURES): Vit D, 25-Hydroxy: 106 ng/mL — ABNORMAL HIGH (ref 30–100)

## 2020-05-07 LAB — TSH: TSH: 0.27 mIU/L — ABNORMAL LOW (ref 0.40–4.50)

## 2020-05-08 NOTE — Progress Notes (Signed)
     C  A N  C  E L  L  E  D          

## 2020-05-09 ENCOUNTER — Ambulatory Visit: Payer: 59 | Admitting: Internal Medicine

## 2020-05-12 ENCOUNTER — Other Ambulatory Visit: Payer: Self-pay | Admitting: Internal Medicine

## 2020-05-19 ENCOUNTER — Inpatient Hospital Stay (HOSPITAL_COMMUNITY): Admission: RE | Admit: 2020-05-19 | Payer: 59 | Source: Ambulatory Visit

## 2020-05-20 ENCOUNTER — Other Ambulatory Visit: Payer: Self-pay | Admitting: Internal Medicine

## 2020-05-20 DIAGNOSIS — E039 Hypothyroidism, unspecified: Secondary | ICD-10-CM

## 2020-05-20 MED ORDER — LEVOTHYROXINE SODIUM 75 MCG PO TABS: ORAL_TABLET | ORAL | 3 refills | Status: DC

## 2020-06-06 ENCOUNTER — Other Ambulatory Visit: Payer: Self-pay | Admitting: Internal Medicine

## 2020-06-06 DIAGNOSIS — R569 Unspecified convulsions: Secondary | ICD-10-CM

## 2020-06-06 MED ORDER — LEVETIRACETAM 500 MG PO TABS: ORAL_TABLET | ORAL | 3 refills | Status: DC

## 2020-06-14 ENCOUNTER — Ambulatory Visit (HOSPITAL_COMMUNITY)
Admission: RE | Admit: 2020-06-14 | Payer: 59 | Source: Ambulatory Visit | Attending: Adult Health | Admitting: Adult Health

## 2020-07-17 ENCOUNTER — Other Ambulatory Visit: Payer: Self-pay | Admitting: Adult Health

## 2020-08-11 ENCOUNTER — Other Ambulatory Visit: Payer: Self-pay | Admitting: Adult Health

## 2020-08-17 ENCOUNTER — Other Ambulatory Visit: Payer: Self-pay | Admitting: Internal Medicine

## 2020-08-17 MED ORDER — CLOPIDOGREL BISULFATE 75 MG PO TABS: ORAL_TABLET | ORAL | 3 refills | Status: DC

## 2020-09-07 ENCOUNTER — Other Ambulatory Visit: Payer: Self-pay | Admitting: Adult Health

## 2020-09-07 ENCOUNTER — Other Ambulatory Visit: Payer: Self-pay | Admitting: Internal Medicine

## 2020-09-07 DIAGNOSIS — R569 Unspecified convulsions: Secondary | ICD-10-CM

## 2020-09-08 ENCOUNTER — Ambulatory Visit: Payer: 59 | Admitting: Adult Health

## 2020-09-19 ENCOUNTER — Other Ambulatory Visit: Payer: Self-pay | Admitting: Internal Medicine

## 2020-09-19 DIAGNOSIS — Z1231 Encounter for screening mammogram for malignant neoplasm of breast: Secondary | ICD-10-CM

## 2020-09-27 ENCOUNTER — Other Ambulatory Visit: Payer: Self-pay

## 2020-09-27 ENCOUNTER — Encounter: Payer: Self-pay | Admitting: Adult Health

## 2020-09-27 ENCOUNTER — Ambulatory Visit
Admission: RE | Admit: 2020-09-27 | Discharge: 2020-09-27 | Disposition: A | Discharge status: Home / Self Care | Payer: 59 | Source: Ambulatory Visit | Attending: Internal Medicine | Admitting: Internal Medicine

## 2020-09-27 ENCOUNTER — Ambulatory Visit (INDEPENDENT_AMBULATORY_CARE_PROVIDER_SITE_OTHER): Payer: 59 | Admitting: Adult Health

## 2020-09-27 VITALS — BP 100/64 | HR 62 | Temp 96.6°F | Wt 169.0 lb

## 2020-09-27 DIAGNOSIS — G40909 Epilepsy, unspecified, not intractable, without status epilepticus: Secondary | ICD-10-CM

## 2020-09-27 DIAGNOSIS — Z122 Encounter for screening for malignant neoplasm of respiratory organs: Secondary | ICD-10-CM

## 2020-09-27 DIAGNOSIS — I739 Peripheral vascular disease, unspecified: Secondary | ICD-10-CM

## 2020-09-27 DIAGNOSIS — I2510 Atherosclerotic heart disease of native coronary artery without angina pectoris: Secondary | ICD-10-CM

## 2020-09-27 DIAGNOSIS — Z1231 Encounter for screening mammogram for malignant neoplasm of breast: Secondary | ICD-10-CM

## 2020-09-27 DIAGNOSIS — I700 Atherosclerosis of aorta: Secondary | ICD-10-CM

## 2020-09-27 DIAGNOSIS — J432 Centrilobular emphysema: Secondary | ICD-10-CM

## 2020-09-27 DIAGNOSIS — I10 Essential (primary) hypertension: Secondary | ICD-10-CM | POA: Diagnosis not present

## 2020-09-27 DIAGNOSIS — Z79899 Other long term (current) drug therapy: Secondary | ICD-10-CM

## 2020-09-27 DIAGNOSIS — E559 Vitamin D deficiency, unspecified: Secondary | ICD-10-CM

## 2020-09-27 DIAGNOSIS — E039 Hypothyroidism, unspecified: Secondary | ICD-10-CM

## 2020-09-27 DIAGNOSIS — F17200 Nicotine dependence, unspecified, uncomplicated: Secondary | ICD-10-CM

## 2020-09-27 DIAGNOSIS — E782 Mixed hyperlipidemia: Secondary | ICD-10-CM

## 2020-09-27 DIAGNOSIS — D631 Anemia in chronic kidney disease: Secondary | ICD-10-CM

## 2020-09-27 DIAGNOSIS — R296 Repeated falls: Secondary | ICD-10-CM

## 2020-09-27 DIAGNOSIS — N184 Chronic kidney disease, stage 4 (severe): Secondary | ICD-10-CM

## 2020-09-27 DIAGNOSIS — E663 Overweight: Secondary | ICD-10-CM

## 2020-09-27 DIAGNOSIS — M21372 Foot drop, left foot: Secondary | ICD-10-CM

## 2020-09-27 NOTE — Patient Instructions (Addendum)
Please call to schedule ultrasound for leg blood vessel - [facility]  Phone: [phone]  Please return for back mole removal by Dr. Melford Aase    A great goal to work towards is aiming to get in a serving daily of some of the most nutritionally dense foods - G- BOMBS daily   Try to reduce processed - sugar, flour, high fructose corn syrup, excess dairy, meat and do as much whole/unprocessed plants - all have benefits, but more color is generally better.   Even if you can't quit, helpful to reduce smoking - take a few cigs out of pack in the morning and pace yourself to make the rest last the rest of the day, slowly increase number you take out

## 2020-09-27 NOTE — Progress Notes (Signed)
FOLLOW UP  Assessment and Plan:    Atherosclerosis of aorta (HCC) Per CT chest Control blood pressure, cholesterol, glucose, increase exercise.   CAD Continue plavix, ASA Control blood pressure, cholesterol, glucose, increase exercise.  Reminded overdue cardiology follow up, no concerns  Hypertension Well controlled with current medications  Monitor blood pressure at home; patient to call if consistently greater than 130/80 Continue DASH diet.   Reminder to go to the ER if any CP, SOB, nausea, dizziness, severe HA, changes vision/speech, left arm numbness and tingling and jaw pain.  Cholesterol Currently at goal of LDL <70 Continue low cholesterol diet and exercise.  Check lipid panel.   Other abnormal glucose Recent A1Cs at goal Discussed diet/exercise, weight management  Defer A1C; check CMP  CKD stage 4 Followed by Dr. Moshe Cipro Increase fluids, avoid NSAIDS, monitor sugars, will monitor Check CMP/GFR  Hypothyroidism Reviewed taking more consistent dose - if at goal, switch to 1/2 tab daily except Sunday continue medications the same pending lab results reminded to take on an empty stomach 30-50mins before food.  check TSH level  Overweight with co morbidities Long discussion about weight loss, diet, and exercise Recommended diet heavy in fruits and veggies and low in animal meats, cheeses, and dairy products, appropriate calorie intake Will follow up in 3 months  Vitamin D Def At goal at last visit; continue supplementation to maintain goal of 60-100 Defer Vit D level  Tobacco use Discussed risks associated with tobacco use and advised to reduce or quit Patient is not ready to do so  She doesn't want to do chantix, wellbutrin didn't help Discussed strategies to help slowly reduce Due for CT follow up - order placed Will follow up at the next visit  Centrilobular emphysema ([place]) Denies sx; by imaging; STOP SMOKING  Seizures Last seizure remote;  stable on keppra  PVD/intermittent claudication  Segmentals/vascular US was cancelled by patient - given phone number to reschedule after discussion, does want to pursue this She is on statin, plavix; have encouraged smoking cessation  Control blood pressure, cholesterol, glucose, increase exercise, rest frequently as needed  Atypical nevi of back - schedule removal ASAP   Unsteady gait/sedentary/left drop foot Chronic left drop foot, denies back pain not interested in referral to specialist Try wearing boots/high top sneakers/ankle brace for additional support Admittedly sedentary but with increased falls will refer to PT as requesed  Continue diet and meds as discussed. Further disposition pending results of labs. Discussed med's effects and SE's.   Over 30 minutes of exam, counseling, chart review, and critical decision making was performed.   Future Appointments  Date Time Provider [place]  05/04/2021  2:00 PM Liane Comber, NP [facility] None    ----------------------------------------------------------------------------------------------------------------------  HPI 60 y.o. female  presents for 3 month follow up on hypertension, cholesterol, glucose management, weight and vitamin D deficiency.   Hx of ruptured cerebral aneurysm in 2005 with repeated repair in 2012, hx of seizure order on keppra - no recent seizures - last in 2008-  Followed by [facility]. Had recent normal keppra level check in 04/2020, has been stable for many years.   Husband had stroke with expressive aphasia, doing speech therapy with some recovery but stopped per his preference, he does need more supervision, she worries about him some but overall improving.   She has chronic left drop foot, evidence of spinal stenosis per MRI 2011; denies notable back pain, ongoing for years; admits more sedentary recently and having more unsteadiness  on her feet, has fallen twice in the last month.  Requesting referral for PT.   BMI is Body mass index is 26.47 kg/m., she has been working on diet and exercise, has been watching portions. She has pedal machine at home under work desk but not very frequently  Wt Readings from Last 3 Encounters:  09/27/20 169 lb (76.7 kg)  05/04/20 176 lb (79.8 kg)  01/05/20 176 lb (79.8 kg)   she currently continues to smoke 1 pack a day; discussed risks associated with smoking, patient is not ready to quit; she tried quitting with her husband (he had lung CA), nicotine gum and slow taper but didn't do well with pandemic; she has tried chantix without benefit, has been on wellbutrin 150 mg without benefit and stopped taking. She had benign appearing CT lung cancer screen on 09/04/2019, due for follow up. She does have mild centrolobular emphysema by imaging but denies.   She has ASCVD with bypass in 2002, aortic atherosclerosis per chest CT, followed annually by cardiology Dr. Acie Fredrickson.  She has hx of PVD; recent years has reported claudication symptoms of LLE, ABI was ordered in Feb 2021 but she hasn't pursued due to husband health situation and pandemic, had rescheduled but patient cancelled 06/14/2020.  She is on statin and plavix.   Her blood pressure has been controlled at home (110-120s/80s), today their BP is BP: 100/64   She does workout occasionally - has seated cycling machine that she does while at her desk. She denies chest pain, shortness of breath, dizziness.    She is on cholesterol medication (crestor 40 mg daily, zetia 10 mg daily) and denies myalgias. Her LDL cholesterol is not at goal (atypically elevated last check).The cholesterol last visit was:   Lab Results  Component Value Date   CHOL 97 05/04/2020   HDL 32 (L) 05/04/2020   LDLCALC 43 05/04/2020   TRIG 135 05/04/2020   CHOLHDL 3.0 05/04/2020    She has been working on diet and exercise for glucose management (intermittent hx of prediabetes), and denies foot ulcerations, increased  appetite, nausea, paresthesia of the feet, polydipsia, polyuria, visual disturbances, vomiting and weight loss.  Last A1C in the office was:  Lab Results  Component Value Date   HGBA1C 5.1 05/04/2020   She is on thyroid medication. Her medication was not changed last visit. 75 mcg - takes MWF, skips all other days Lab Results  Component Value Date   TSH 0.27 (L) 05/04/2020    She is followed by Dr. Corliss Parish for CKD 4. She has stopped taking goody powders admits still smoking. She drinks 3-4 bottles of water daily.  Lab Results  Component Value Date   GFRNONAA 23 (L) 05/04/2020   GFRNONAA 16 (L) 01/05/2020   GFRNONAA 23 (L) 09/07/2019   Patient is not on Vitamin D supplement, stopped taking previously after high value and at goal at last check, now has been off completely for several months, previously taking 50000 IU 3 days a week:    Lab Results  Component Value Date   VD25OH 106 (H) 05/04/2020        Current Medications:  Current Outpatient Medications on File Prior to Visit  Medication Sig   clopidogrel (PLAVIX) 75 MG tablet Take  1 tablet  Daily  to Prevent  Blood Clots   ezetimibe (ZETIA) 10 MG tablet TAKE 1 TABLET(10 MG) BY MOUTH DAILY   furosemide (LASIX) 40 MG tablet TAKE 1 TABLET BY MOUTH TWICE DAILY  FOR BLOOD PRESSURE AND FLUID RETENTION OR ANKLE SWELLING   labetalol (NORMODYNE) 300 MG tablet TAKE 1 TABLET BY MOUTH TWO  TIMES DAILY   levETIRAcetam (KEPPRA) 500 MG tablet TAKE 1 TABLET BY MOUTH TWICE DAILY TO PREVENT SEIZURES   levothyroxine (SYNTHROID) 75 MCG tablet Take 1/2 tablet daily except skip Sunday. Take an empty stomach with only water for 30 minutes & no Antacid meds, Calcium or Magnesium for 4 hours & avoid Biotin   losartan (COZAAR) 50 MG tablet Take 50 mg by mouth daily.   rosuvastatin (CRESTOR) 40 MG tablet TAKE 1 TABLET BY MOUTH  DAILY FOR CHOLESTEROL   No current facility-administered medications on file prior to visit.     Allergies:   Allergies  Allergen Reactions   Ace Inhibitors Cough   Penicillins    Prednisone     Dysphoria     Medical History:  Past Medical History:  Diagnosis Date   Aneurysm ([place])    CAD (coronary artery disease)    CKD (chronic kidney disease) stage 3, GFR 30-59 ml/min (HCC)    History of basal cell carcinoma excision 12/20/2017   Hyperlipidemia LDL goal <70    Hypertension    Hypothyroidism    Tobacco abuse    Vitamin D deficiency    Family history- Reviewed and unchanged Social history- Reviewed and unchanged   Review of Systems:  Review of Systems  Constitutional:  Positive for malaise/fatigue. Negative for weight loss.  HENT:  Negative for hearing loss and tinnitus.   Eyes:  Negative for blurred vision and double vision.  Respiratory:  Negative for cough, shortness of breath and wheezing.   Cardiovascular:  Positive for claudication (LLE after 5 blocks). Negative for chest pain, palpitations, orthopnea and leg swelling.  Gastrointestinal:  Negative for abdominal pain, blood in stool, constipation, diarrhea, heartburn, melena, nausea and vomiting.  Genitourinary: Negative.   Musculoskeletal:  Positive for falls (2 in last month). Negative for joint pain and myalgias.  Skin:  Negative for rash.  Neurological:  Positive for focal weakness (chronic left drop foot). Negative for dizziness, tingling, sensory change, weakness and headaches.  Endo/Heme/Allergies:  Negative for polydipsia.  Psychiatric/Behavioral: Negative.    All other systems reviewed and are negative.   Physical Exam: BP 100/64   Pulse 62   Temp (!) 96.6 F (35.9 C)   Wt 169 lb (76.7 kg)   SpO2 99%   BMI 26.47 kg/m  Wt Readings from Last 3 Encounters:  09/27/20 169 lb (76.7 kg)  05/04/20 176 lb (79.8 kg)  01/05/20 176 lb (79.8 kg)   General Appearance: Well nourished, in no apparent distress. Eyes: PERRLA, EOMs, conjunctiva no swelling or erythema Sinuses: No Frontal/maxillary tenderness ENT/Mouth:  Ext aud canals clear, TMs without erythema, bulging. No erythema, swelling, or exudate on post pharynx.  Tonsils not swollen or erythematous. Hearing normal.  Neck: Supple, thyroid normal.  Respiratory: Respiratory effort normal, BS equal bilaterally without rales, rhonchi, wheezing or stridor.  Cardio: RRR with harsh 3/6 decreshendo systolic murmur. Thready peripheral pulses with nonpitting edema to bilateral ankles/lower legs, chronic and unchanged.  Abdomen: Soft, + BS.  Non tender, no guarding, rebound, hernias, masses. Lymphatics: Non tender without lymphadenopathy.  Musculoskeletal: Full ROM, 5/5 strength excepting left ankle dorsiflexion 3/5, mildly antalgic gait,  Skin: Warm, dry; multiple pearly growths to face and neck without erythema or ulceration. she has approx 1.5 cm x 1 cm raised, erythematous, irregular, flaking skin lesion to R lower back  Neuro: Cranial  nerves intact. No cerebellar symptoms. Sensation intact.  Psych: Awake and oriented X 3, normal affect, Insight and Judgment appropriate.    Izora Ribas, NP 4:43 PM [facility]

## 2020-09-28 ENCOUNTER — Encounter: Payer: Self-pay | Admitting: Adult Health

## 2020-09-28 ENCOUNTER — Other Ambulatory Visit: Payer: Self-pay | Admitting: Adult Health

## 2020-09-28 ENCOUNTER — Ambulatory Visit (HOSPITAL_BASED_OUTPATIENT_CLINIC_OR_DEPARTMENT_OTHER)
Admission: RE | Admit: 2020-09-28 | Discharge: 2020-09-28 | Disposition: A | Discharge status: Home / Self Care | Payer: 59 | Source: Ambulatory Visit | Attending: Adult Health | Admitting: Adult Health

## 2020-09-28 DIAGNOSIS — R7989 Other specified abnormal findings of blood chemistry: Secondary | ICD-10-CM | POA: Diagnosis present

## 2020-09-28 DIAGNOSIS — N179 Acute kidney failure, unspecified: Secondary | ICD-10-CM

## 2020-09-28 DIAGNOSIS — N281 Cyst of kidney, acquired: Secondary | ICD-10-CM

## 2020-09-28 LAB — CBC WITH DIFFERENTIAL/PLATELET
Absolute Monocytes: 753 cells/uL (ref 200–950)
Basophils Absolute: 57 cells/uL (ref 0–200)
Basophils Relative: 0.8 %
Eosinophils Absolute: 405 cells/uL (ref 15–500)
Eosinophils Relative: 5.7 %
HCT: 30.6 % — ABNORMAL LOW (ref 35.0–45.0)
Hemoglobin: 10.3 g/dL — ABNORMAL LOW (ref 11.7–15.5)
Lymphs Abs: 1931 cells/uL (ref 850–3900)
MCH: 33 pg (ref 27.0–33.0)
MCHC: 33.7 g/dL (ref 32.0–36.0)
MCV: 98.1 fL (ref 80.0–100.0)
MPV: 10.8 fL (ref 7.5–12.5)
Monocytes Relative: 10.6 %
Neutro Abs: 3955 cells/uL (ref 1500–7800)
Neutrophils Relative %: 55.7 %
Platelets: 154 10*3/uL (ref 140–400)
RBC: 3.12 10*6/uL — ABNORMAL LOW (ref 3.80–5.10)
RDW: 12.7 % (ref 11.0–15.0)
Total Lymphocyte: 27.2 %
WBC: 7.1 10*3/uL (ref 3.8–10.8)

## 2020-09-28 LAB — LIPID PANEL
Cholesterol: 97 mg/dL (ref <200)
HDL: 24 mg/dL — ABNORMAL LOW (ref >=50)
LDL Cholesterol (Calc): 47 mg/dL (calc)
Non-HDL Cholesterol (Calc): 73 mg/dL (calc) (ref <130)
Total CHOL/HDL Ratio: 4 (calc) (ref <5.0)
Triglycerides: 191 mg/dL — ABNORMAL HIGH (ref <150)

## 2020-09-28 LAB — COMPLETE METABOLIC PANEL WITH GFR
AG Ratio: 1.9 (calc) (ref 1.0–2.5)
ALT: 254 U/L — ABNORMAL HIGH (ref 6–29)
AST: 263 U/L — ABNORMAL HIGH (ref 10–35)
Albumin: 4.4 g/dL (ref 3.6–5.1)
Alkaline phosphatase (APISO): 81 U/L (ref 37–153)
BUN/Creatinine Ratio: 15 (calc) (ref 6–22)
BUN: 57 mg/dL — ABNORMAL HIGH (ref 7–25)
CO2: 23 mmol/L (ref 20–32)
Calcium: 8.9 mg/dL (ref 8.6–10.4)
Chloride: 105 mmol/L (ref 98–110)
Creat: 3.78 mg/dL — ABNORMAL HIGH (ref 0.50–1.05)
Globulin: 2.3 g/dL (calc) (ref 1.9–3.7)
Glucose, Bld: 89 mg/dL (ref 65–99)
Potassium: 4.1 mmol/L (ref 3.5–5.3)
Sodium: 140 mmol/L (ref 135–146)
Total Bilirubin: 0.3 mg/dL (ref 0.2–1.2)
Total Protein: 6.7 g/dL (ref 6.1–8.1)
eGFR: 13 mL/min/{1.73_m2} — ABNORMAL LOW (ref >=60)

## 2020-09-28 LAB — TSH: TSH: 0.71 mIU/L (ref 0.40–4.50)

## 2020-09-28 LAB — MAGNESIUM: Magnesium: 2.5 mg/dL (ref 1.5–2.5)

## 2020-09-29 ENCOUNTER — Other Ambulatory Visit: Payer: Self-pay

## 2020-09-29 ENCOUNTER — Other Ambulatory Visit: Payer: 59

## 2020-09-29 DIAGNOSIS — R7989 Other specified abnormal findings of blood chemistry: Secondary | ICD-10-CM

## 2020-09-29 DIAGNOSIS — N179 Acute kidney failure, unspecified: Secondary | ICD-10-CM

## 2020-09-30 ENCOUNTER — Other Ambulatory Visit: Payer: Self-pay | Admitting: Adult Health

## 2020-09-30 DIAGNOSIS — R7989 Other specified abnormal findings of blood chemistry: Secondary | ICD-10-CM

## 2020-09-30 LAB — URINALYSIS, ROUTINE W REFLEX MICROSCOPIC
Bilirubin Urine: NEGATIVE
Glucose, UA: NEGATIVE
Hyaline Cast: NONE SEEN /LPF
Ketones, ur: NEGATIVE
Leukocytes,Ua: NEGATIVE
Nitrite: NEGATIVE
Specific Gravity, Urine: 1.007 (ref 1.001–1.035)
pH: 5.5 (ref 5.0–8.0)

## 2020-09-30 LAB — HEPATITIS PANEL, ACUTE
Hep A IgM: NONREACTIVE
Hep B C IgM: NONREACTIVE
Hepatitis B Surface Ag: NONREACTIVE
Hepatitis C Ab: NONREACTIVE
SIGNAL TO CUT-OFF: 0.01 (ref <1.00)

## 2020-09-30 LAB — COMPLETE METABOLIC PANEL WITH GFR
AG Ratio: 1.7 (calc) (ref 1.0–2.5)
ALT: 253 U/L — ABNORMAL HIGH (ref 6–29)
AST: 293 U/L — ABNORMAL HIGH (ref 10–35)
Albumin: 4 g/dL (ref 3.6–5.1)
Alkaline phosphatase (APISO): 76 U/L (ref 37–153)
BUN/Creatinine Ratio: 16 (calc) (ref 6–22)
BUN: 60 mg/dL — ABNORMAL HIGH (ref 7–25)
CO2: 20 mmol/L (ref 20–32)
Calcium: 8.7 mg/dL (ref 8.6–10.4)
Chloride: 102 mmol/L (ref 98–110)
Creat: 3.74 mg/dL — ABNORMAL HIGH (ref 0.50–1.05)
Globulin: 2.4 g/dL (calc) (ref 1.9–3.7)
Glucose, Bld: 88 mg/dL (ref 65–99)
Potassium: 4.3 mmol/L (ref 3.5–5.3)
Sodium: 134 mmol/L — ABNORMAL LOW (ref 135–146)
Total Bilirubin: 0.3 mg/dL (ref 0.2–1.2)
Total Protein: 6.4 g/dL (ref 6.1–8.1)
eGFR: 13 mL/min/{1.73_m2} — ABNORMAL LOW (ref >=60)

## 2020-09-30 LAB — MICROALBUMIN / CREATININE URINE RATIO
Creatinine, Urine: 38 mg/dL (ref 20–275)
Microalb Creat Ratio: 79 mcg/mg creat — ABNORMAL HIGH (ref <30)
Microalb, Ur: 3 mg/dL

## 2020-09-30 LAB — MICROSCOPIC MESSAGE

## 2020-10-03 NOTE — Progress Notes (Signed)
Faxed 09/29/20 resulted labs to [facility], [facility], and [facility].

## 2020-10-04 ENCOUNTER — Ambulatory Visit: Payer: 59 | Admitting: Internal Medicine

## 2020-10-05 ENCOUNTER — Other Ambulatory Visit: Payer: Self-pay | Admitting: Urology

## 2020-10-05 DIAGNOSIS — N281 Cyst of kidney, acquired: Secondary | ICD-10-CM

## 2020-10-09 NOTE — Progress Notes (Signed)
                                                                                                                                                             M  I  A   (Left w/o being seen)                             History of Present Illness:     Patient is a very nice 60 yo MWF  with HTN, CKD4-5 (last GFR 13)  And also newly unexplained elevated LFT's who presents for   Patient has recently been referred back to Nephrology and also to GI.

## 2020-10-10 ENCOUNTER — Ambulatory Visit: Payer: 59 | Admitting: Internal Medicine

## 2020-10-10 ENCOUNTER — Encounter: Payer: Self-pay | Admitting: Internal Medicine

## 2020-10-10 ENCOUNTER — Other Ambulatory Visit: Payer: Self-pay

## 2020-10-10 VITALS — BP 142/78 | HR 59 | Temp 97.6°F | Resp 17 | Ht 67.0 in | Wt 169.4 lb

## 2020-10-18 ENCOUNTER — Ambulatory Visit: Payer: 59 | Attending: Adult Health | Admitting: Physical Therapy

## 2020-10-18 ENCOUNTER — Other Ambulatory Visit: Payer: Self-pay

## 2020-10-18 DIAGNOSIS — M21372 Foot drop, left foot: Secondary | ICD-10-CM | POA: Diagnosis present

## 2020-10-18 DIAGNOSIS — M6281 Muscle weakness (generalized): Secondary | ICD-10-CM

## 2020-10-18 DIAGNOSIS — R2689 Other abnormalities of gait and mobility: Secondary | ICD-10-CM | POA: Diagnosis present

## 2020-10-18 DIAGNOSIS — M79672 Pain in left foot: Secondary | ICD-10-CM | POA: Insufficient documentation

## 2020-10-18 NOTE — Therapy (Signed)
[place], [place], [address] Phone: [phone]   Fax:  [phone]  Physical Therapy Evaluation  Patient Details  Name: Olivia Walsh MRN: [id number] Date of Birth: [birth year] Referring Provider (PT): Liane Comber NP   Encounter Date: 10/18/2020   PT End of Session - 10/18/20 1802     Visit Number 1    Number of Visits 16    Date for PT Re-Evaluation 12/13/20    Authorization Type UHC    PT Start Time 3810    PT Stop Time 1751    PT Time Calculation (min) 50 min    Activity Tolerance Patient tolerated treatment well    Behavior During Therapy [facility] for tasks assessed/performed             Past Medical History:  Diagnosis Date   Aneurysm ([place])    CAD (coronary artery disease)    CKD (chronic kidney disease) stage 3, GFR 30-59 ml/min ([place])    History of basal cell carcinoma excision 12/20/2017   Hyperlipidemia LDL goal <70    Hypertension    Hypothyroidism    Tobacco abuse    Vitamin D deficiency     Past Surgical History:  Procedure Laterality Date   CATARACT EXTRACTION, BILATERAL Bilateral 2020   CEREBRAL ANEURYSM REPAIR Right 2012   CEREBRAL ANEURYSM REPAIR Right 2005   CORONARY ARTERY BYPASS GRAFT  2002   Dr Cyndia Bent   EMBOLIZATION  2005   Embolization of right PICA aneurysm    There were no vitals filed for this visit.    Subjective Assessment - 10/18/20 1559     Subjective I had an aneurysm in 2005 ruptured and repaired and then a " leaking aneurysm" in 2012 with repair. I noticed I started having a drop foot about 2011 and now I have been having 5 falls in last 6 months.  I dont have any back pain.  Pt denies any numbness in saddle distribution. No tingling or nerve pain except for toe tingling occassionally. Pt reports foot drop since 2011 but has never been to a specialist.  Pt denies any back pain.    Pertinent History Hx of ruptured cerebral aneurysm in 2005 with  repeated repair in 2012, hx of seizure order on keppra - no recent seizures - last in 2008-  Followed by [facility].  CABG march 1999, saphenous vein removal in L LE. Pt  works from home for [employer]. Hyper liidemia, Hypothyroidism, mass on kidney, CAD, CKD    How long can you sit comfortably? unlimited    How long can you stand comfortably? avoids standing fear of falling and fatigued and habit    How long can you walk comfortably? not currently walking much due to fear of falling   has a restorator pedals under desk.    Diagnostic tests no xrays of foot etc.    Patient Stated Goals to get stronger in my legs and keep myself from falling    Currently in Pain? Yes    Pain Score 1     Pain Location Foot    Pain Orientation Left    Pain Descriptors / Indicators Tingling    Pain Type Chronic pain    Pain Radiating Towards tingling in toes. like going to sleep.    Pain Onset More than a month ago    Pain Frequency Constant    Aggravating Factors  aggravates  unsure    Multiple Pain Sites  Yes    Pain Score 2    Pain Location Knee    Pain Orientation Left    Pain Descriptors / Indicators Aching    Pain Type Chronic pain    Pain Onset More than a month ago    Pain Frequency Occasional    Aggravating Factors  my husband drives a TAhoe , I have to step up on a large step.                [facility] PT Assessment - 10/18/20 0001       Assessment   Medical Diagnosis Foot drop  L and multiple falls    Referring Provider (PT) Liane Comber NP    Onset Date/Surgical Date --   about 2009   Hand Dominance Left    Next MD Visit 10-20-20 for mole removal    Prior Therapy none      Precautions   Precautions Fall      Restrictions   Weight Bearing Restrictions No      Balance Screen   Has the patient fallen in the past 6 months Yes    How many times? 5 falls but I am having trouble get back up    Has the patient had a decrease in activity level because of a fear of  falling?  Yes    Is the patient reluctant to leave their home because of a fear of falling?  Yes      Prior Function   Level of Independence Independent    Vocation Full time employment   desk /computer     Cognition   Overall Cognitive Status Within Functional Limits for tasks assessed      Observation/Other Assessments   Focus on Therapeutic Outcomes (FOTO)  Foto intake49% intake, predicted 66%      Observation/Other Assessments-Edema    Edema Figure 8      Figure 8 Edema   Figure 8 - Right  55.0 cm    Figure 8 - Left  58.5 cm      Sensation   Light Touch Appears Intact      Functional Tests   Functional tests Sit to Stand      Sit to Stand   Comments 5 x sts 23.10      Posture/Postural Control   Posture/Postural Control Postural limitations    Postural Limitations Rounded Shoulders;Forward head;Posterior pelvic tilt    Posture Comments foot drop on left      AROM   Overall AROM  Deficits    Overall AROM Comments back WFL all planes    Right Knee Extension 0    Right Knee Flexion 140    Left Knee Extension 0    Left Knee Flexion 136    Right Ankle Dorsiflexion 5    Right Ankle Plantar Flexion 60    Right Ankle Inversion 38    Right Ankle Eversion 12    Left Ankle Dorsiflexion -30   drop foot   Left Ankle Plantar Flexion 54    Left Ankle Inversion 15    Left Ankle Eversion 5      Strength   Overall Strength Deficits    Overall Strength Comments great toe Left trace    Right Hip Flexion 4+/5    Right Hip Extension 4/5    Right Hip ABduction 4-/5    Left Hip Flexion 4+/5    Left Hip Extension 4-/5    Left Hip ABduction 4-/5    Right Knee  Flexion 4/5    Right Knee Extension 4/5    Right Ankle Dorsiflexion 4+/5    Right Ankle Plantar Flexion 4+/5    Left Ankle Dorsiflexion 2-/5    Left Ankle Plantar Flexion 4-/5      Flexibility   Hamstrings bil hamstring tightness L > R      Palpation   Palpation comment tenderness over achilless tendon upon stretch       Ambulation/Gait   Ambulation/Gait Yes    Assistive device None    Gait Pattern Poor foot clearance - left;Left steppage    Ambulation Surface Level    Gait Comments pt has cane but has fallen 5 times in six months                        Objective measurements completed on examination: See above findings.                 PT Short Term Goals - 10/18/20 1744       PT SHORT TERM GOAL #1   Title Pt will be independent with initial HEP    Baseline no knowledge    Time 3    Period Weeks    Status New    Target Date 11/08/20      PT SHORT TERM GOAL #2   Title Pt  will increase DF of Left foot 10 degrees for better foot clearance in gait    Baseline -30 DF Left eval    Time 3    Period Weeks    Status New    Target Date 11/08/20      PT SHORT TERM GOAL #3   Title Pt will be gait trained with safest AD. ( presently with cane) to prevent falls and assess need for DF assist (Ossur soft ankle DF assist brace)    Baseline has cane but continues to fall due to Left foot drop.    Time 3    Period Weeks    Status New    Target Date 11/08/20               PT Long Term Goals - 10/18/20 1750       PT LONG TERM GOAL #1   Title Pt will be indepedent with advanced HEP    Baseline no exercise    Time 8    Period Weeks    Status New    Target Date 12/13/20      PT LONG TERM GOAL #2   Title Pt will be able to increase DF to at lease neutral to decrease of risk of fall    Baseline eval -30    Time 8    Period Weeks    Status New    Target Date 12/13/20      PT LONG TERM GOAL #3   Title Pt will be able to squat and pick up  10 lb groceries without fear of falling    Baseline Pt fearful of picking up items off of ground due to fear of falling    Period Weeks    Status New    Target Date 12/13/20      PT LONG TERM GOAL #4   Title Pt will demostrate floor to standing transfer independently to show fall preparedness and reduce fear of   falling    Baseline Pt reluctant to leave home due to fear of falling and unable to get back up  Time 8    Period Weeks    Status New    Target Date 12/13/20      PT LONG TERM GOAL #5   Title FOTO will improve from49%    to   66%  indicating improved functional mobility.    Baseline eval 49%    Time 8    Period Weeks    Status New    Target Date 12/13/20      PT LONG TERM GOAL #6   Title Pt will incorporate regular exercise such as 30 min of walking 3-5 x a week to reduce sedentary lifestyle contributing to deconditoning and LE weakness    Baseline no knowledge, pt is smoker and sedentary    Time 8    Period Weeks    Status New    Target Date 12/13/20                    Plan - 10/18/20 1726     Clinical Impression Statement Olivia Walsh presents with 6 month history or 5 falls and reporting a foot drop about 2011.  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  She reports no back pain and reports no saddle anesthesia or problems with urine or incontinence. Pt does have very tight heel cord  of Left ankle and trace  Left Great toe strength/ decreased left dorsiflexion strength.  Pt exhibits weakness in L-5 distribution., tight hamstrings bil L >R. Pt walks with steppage gait and does use a cane at times. but spends most of day sitting.  Olivia Walsh states she is fearful of falling or going out in the community.  Her 5 x STS is 23.10 which indicates high risk for falls (  normal 13 sec).  Pt will return to MD on Thursday and will benefit for referral for Left foot weakness to determine cause.  Pt will benefit from skilled PT to address LE weakness and deconditioning as well as stretching Left heel cord to reduce steppage gait and decrease risk of fall. Pt has straight cane for AD but may need additonal AFO if weakness  continues to be an issue and not improving to prevent falls    Personal Factors and Comorbidities Comorbidity 3+    Comorbidities Hx of  ruptured cerebral aneurysm in 2005 with repeated repair in 2012, hx of seizure order on keppra - no recent seizures - last in 2008-  Followed by [facility].  CABG march 1999, saphenous vein removal in L LE. Pt  works from home for [employer]. Hyper liidemia, Hypothyroidism, mass on kidney, CAD, CKD    Examination-Activity Limitations Stairs;Squat;Carry;Locomotion Level;Stand    Examination-Participation Restrictions Occupation;Laundry    Stability/Clinical Decision Making Evolving/Moderate complexity    Clinical Decision Making Moderate    Rehab Potential Good    PT Frequency 2x / week    PT Duration 8 weeks    PT Treatment/Interventions ADLs/Self Care Home Management;Cryotherapy;Electrical Stimulation;Iontophoresis 4mg /ml Dexamethasone;Moist Heat;Balance training;Therapeutic exercise;Therapeutic activities;Functional mobility training;Stair training;Gait training;Neuromuscular re-education;Manual techniques;Patient/family education;Orthotic Fit/Training;Passive range of motion;Dry needling;Taping;Joint Manipulations    PT Next Visit Plan balance assessment, SLS, gait with cane on step.  possible need for Ossur brace    PT Home Exercise Plan Access Code: [email or web address]    Consulted and Agree with Plan of Care Patient             Patient will benefit from skilled therapeutic intervention in order to improve the following deficits  and impairments:  Pain, Impaired sensation, Abnormal gait, Decreased activity tolerance, Decreased mobility, Decreased range of motion, Decreased strength, Increased edema  Visit Diagnosis: Pain in left foot  Foot drop, left  Muscle weakness (generalized)  Other abnormalities of gait and mobility  Access Code: [email or web address]: [email or web address]: 08/23/2022Prepared by: Donnetta Simpers BeardsleyExercises  Seated Calf Stretch with Strap - 2-3 x daily - 7 x weekly - 1 sets - 3 reps - 20-30 sec hold  Sit to Stand with Counter Support - 3 x daily  - 7 x weekly - 1 sets - 15 reps  Heel Raises with Counter Support - 2 x daily - 7 x weekly - 3 sets - 10 reps   Problem List Patient Active Problem List   Diagnosis Date Noted   Elevated LFTs 09/28/2020   Acute kidney injury ([place]) 09/28/2020   Complex renal cyst 09/28/2020   Multiple falls 09/27/2020   Left foot drop 09/27/2020   History of basal cell carcinoma (BCC) 05/04/2020   Atypical nevus of right lower back 04/20/2019   Emphysema of lung ([place]) 08/25/2018   Aortic atherosclerosis ([place]) 08/25/2018   Cholelithiases 08/25/2018   Claudication of left lower extremity ([place]) 04/02/2018   Anemia in chronic kidney disease 02/01/2017   Overweight (BMI 25.0-29.9) 12/02/2014   Medication management 06/25/2014   Poor compliance 10/09/2013   Vitamin D deficiency    SMOKER 08/17/2009   Hypothyroidism 08/16/2009   Hyperlipidemia 08/16/2009   Essential hypertension 08/16/2009   CAD (coronary artery disease) 08/16/2009   History of CVA (cerebrovascular accident) 08/16/2009   Peripheral vascular disease ([place]) 08/16/2009   CKD (chronic kidney disease) stage 4, GFR 15-29 ml/min ([place]) 08/16/2009   Seizure disorder ([place]) 08/16/2009    Voncille Lo, PT, [place] Certified Exercise Expert for the Aging Adult  10/18/20 6:13 PM Phone: [phone] Fax: [facility] [address] [place], [place], [address] Phone: [phone]   Fax:  [phone]  Name: Olivia Walsh MRN: [id number] Date of Birth: [birth year]

## 2020-10-18 NOTE — Patient Instructions (Signed)
   Olivia Walsh, PT, [place] Certified Exercise Expert for the Aging Adult  10/18/20 4:46 PM Phone: [phone] Fax: [phone]

## 2020-10-19 ENCOUNTER — Other Ambulatory Visit: Payer: Self-pay | Admitting: Adult Health

## 2020-10-19 DIAGNOSIS — M21372 Foot drop, left foot: Secondary | ICD-10-CM

## 2020-10-20 ENCOUNTER — Ambulatory Visit (INDEPENDENT_AMBULATORY_CARE_PROVIDER_SITE_OTHER): Payer: 59 | Admitting: Internal Medicine

## 2020-10-20 ENCOUNTER — Other Ambulatory Visit: Payer: Self-pay

## 2020-10-20 ENCOUNTER — Other Ambulatory Visit: Payer: Self-pay | Admitting: Internal Medicine

## 2020-10-20 ENCOUNTER — Encounter: Payer: Self-pay | Admitting: Internal Medicine

## 2020-10-20 VITALS — BP 150/82 | HR 59 | Temp 97.8°F | Resp 16 | Ht 67.0 in | Wt 173.2 lb

## 2020-10-20 DIAGNOSIS — D485 Neoplasm of uncertain behavior of skin: Secondary | ICD-10-CM

## 2020-10-20 DIAGNOSIS — I10 Essential (primary) hypertension: Secondary | ICD-10-CM

## 2020-10-20 NOTE — Progress Notes (Signed)
Future Appointments  Date Time Provider [place]  01/30/2021  9:30 AM [name], MD [facility] None  05/04/2021  2:00 PM Liane Comber, NP [facility] None    History of Present Illness:    Patient presents for BP recheck and also has concerns  re: shin lesion of her Rt lateral mid back which seems to have increased in size.   Medications  Current Outpatient Medications (Endocrine & Metabolic):    levothyroxine (SYNTHROID) 75 MCG tablet, Take 1/2 tablet daily except skip Sunday. Take an empty stomach with only water for 30 minutes & no Antacid meds, Calcium or Magnesium for 4 hours & avoid Biotin  Current Outpatient Medications (Cardiovascular):    ezetimibe (ZETIA) 10 MG tablet, TAKE 1 TABLET(10 MG) BY MOUTH DAILY   labetalol (NORMODYNE) 300 MG tablet, TAKE 1 TABLET BY MOUTH TWO  TIMES DAILY   losartan (COZAAR) 50 MG tablet, Take 50 mg by mouth daily.   rosuvastatin (CRESTOR) 40 MG tablet, TAKE 1 TABLET BY MOUTH  DAILY FOR CHOLESTEROL (Patient taking differently: TAKE 1 TABLET BY MOUTH  DAILY FOR CHOLESTEROL)    Current Outpatient Medications (Hematological):    clopidogrel (PLAVIX) 75 MG tablet, Take  1 tablet  Daily  to Prevent  Blood Clots  Current Outpatient Medications (Other):    levETIRAcetam (KEPPRA) 500 MG tablet, TAKE 1 TABLET BY MOUTH TWICE DAILY TO PREVENT SEIZURES  Problem list She has Hypothyroidism; Hyperlipidemia; SMOKER; Essential hypertension; CAD (coronary artery disease); History of CVA (cerebrovascular accident); Peripheral vascular disease ([place]); CKD (chronic kidney disease) stage 4, GFR 15-29 ml/min ([place]); Seizure disorder ([place]); Vitamin D deficiency; Poor compliance; Medication management; Overweight (BMI 25.0-29.9); Anemia in chronic kidney disease; Claudication of left lower extremity ([place]); Emphysema of lung ([place]); Aortic atherosclerosis ([place]); Cholelithiases; Atypical nevus of right lower back; History of basal cell carcinoma (BCC);  Multiple falls; Left foot drop; Elevated LFTs; Acute kidney injury ([place]); and Complex renal cyst on their problem list.   Observations/Objective:  BP (!) 150/82   Pulse (!) 59   Temp 97.8 F (36.6 C)   Resp 16   Ht 5\' 7"  (1.702 m)   Wt 173 lb 3.2 oz (78.6 kg)   SpO2 97%   BMI 27.13 kg/m                                              Skin focused exam finds a n approc 12 mm x 20 mm irregular pink raised lesion on the Left lateral mid back.    Procedure (CPT:   17616  )       After informed consent ans aseptic prep with alcohol the above lesion was sharply excised in toto -full thickness to the deep sub-cut tissue with a # 10 scalpel.  Then the wound edges were undermined aligned, approximated and everted with a running suture of Proline 3-0. The wound was dressed with a sterile bandage & covered with a  2" x 3 " Tegaderm. Patient was instructed in wound care and advised to return in 12-14 days for suture removal.    Assessment and Plan:   1. Essential hypertension   2. 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 The patient was provided an opportunity to  ask questions and all were answered. The patient agreed with the plan and demonstrated an understanding of the instructions.       The patient was advised to call back or seek an in-person evaluation if the symptoms worsen or if the condition fails to improve as anticipated.    Kirtland Bouchard, MD

## 2020-10-22 ENCOUNTER — Ambulatory Visit
Admission: RE | Admit: 2020-10-22 | Discharge: 2020-10-22 | Disposition: A | Discharge status: Home / Self Care | Payer: 59 | Source: Ambulatory Visit | Attending: Urology | Admitting: Urology

## 2020-10-22 ENCOUNTER — Other Ambulatory Visit: Payer: Self-pay

## 2020-10-22 DIAGNOSIS — N281 Cyst of kidney, acquired: Secondary | ICD-10-CM

## 2020-10-22 MED ORDER — GADOBENATE DIMEGLUMINE 529 MG/ML IV SOLN
20.0000 mL | Freq: Once | INTRAVENOUS | Status: AC | PRN
  Administered 2020-10-22: 20 mL via INTRAVENOUS

## 2020-10-25 ENCOUNTER — Other Ambulatory Visit: Payer: Self-pay

## 2020-10-25 ENCOUNTER — Ambulatory Visit
Admission: RE | Admit: 2020-10-25 | Discharge: 2020-10-25 | Disposition: A | Discharge status: Home / Self Care | Payer: 59 | Source: Ambulatory Visit | Attending: Adult Health | Admitting: Adult Health

## 2020-10-25 DIAGNOSIS — F17200 Nicotine dependence, unspecified, uncomplicated: Secondary | ICD-10-CM

## 2020-10-25 DIAGNOSIS — Z122 Encounter for screening for malignant neoplasm of respiratory organs: Secondary | ICD-10-CM

## 2020-10-25 NOTE — Progress Notes (Signed)
============================================================== ==============================================================  -    Pathology of Skin Bx showed a skin cancer with a margin involved - So when   returns for suture removal - will need to do a wider excision.  ============================================================== ==============================================================

## 2020-10-26 ENCOUNTER — Ambulatory Visit: Payer: 59

## 2020-10-28 ENCOUNTER — Ambulatory Visit: Payer: 59 | Attending: Adult Health

## 2020-10-28 ENCOUNTER — Other Ambulatory Visit: Payer: Self-pay

## 2020-10-28 DIAGNOSIS — M79672 Pain in left foot: Secondary | ICD-10-CM | POA: Diagnosis not present

## 2020-10-28 DIAGNOSIS — M6281 Muscle weakness (generalized): Secondary | ICD-10-CM | POA: Diagnosis present

## 2020-10-28 DIAGNOSIS — M21372 Foot drop, left foot: Secondary | ICD-10-CM | POA: Diagnosis present

## 2020-10-28 DIAGNOSIS — R2689 Other abnormalities of gait and mobility: Secondary | ICD-10-CM | POA: Diagnosis present

## 2020-10-28 NOTE — Therapy (Signed)
[place], [place], [address] Phone: [phone]   Fax:  [phone]  Physical Therapy Treatment  Patient Details  Name: Olivia Walsh MRN: [id number] Date of Birth: 06/06/1960 Referring Provider (PT): Liane Comber NP   Encounter Date: 10/28/2020   PT End of Session - 10/28/20 1304     Visit Number 2    Number of Visits 16    Date for PT Re-Evaluation 12/13/20    Authorization Type UHC    PT Start Time 1215    PT Stop Time 1300    PT Time Calculation (min) 45 min    Equipment Utilized During Treatment Gait belt    Activity Tolerance Patient tolerated treatment well    Behavior During Therapy [facility] for tasks assessed/performed             Past Medical History:  Diagnosis Date   Aneurysm ([place])    CAD (coronary artery disease)    CKD (chronic kidney disease) stage 3, GFR 30-59 ml/min ([place])    History of basal cell carcinoma excision 12/20/2017   Hyperlipidemia LDL goal <70    Hypertension    Hypothyroidism    Tobacco abuse    Vitamin D deficiency     Past Surgical History:  Procedure Laterality Date   CATARACT EXTRACTION, BILATERAL Bilateral 2020   CEREBRAL ANEURYSM REPAIR Right 2012   CEREBRAL ANEURYSM REPAIR Right 2005   CORONARY ARTERY BYPASS GRAFT  2002   Dr Cyndia Bent   EMBOLIZATION  2005   Embolization of right PICA aneurysm    There were no vitals filed for this visit.   Subjective Assessment - 10/28/20 1213     Subjective Pt reports that her exercises have been going well, although standing without UE assistance is still very difficult. Pt reports that she thinks she might need a quad cane. She also states that she hopes to get stronger to help in instances when she falls to get up from the floor.    Currently in Pain? No/denies    Pain Score 0-No pain                               [facility] Adult PT Treatment/Exercise - 10/28/20 0001       Ankle  Exercises: Aerobic   Nustep x5 minutes, level 4, self-selected pace while collecting subjective information      Ankle Exercises: Standing   SLS 3x30 sec on Airex pad in // bars    Toe Raise Other (comment)   [place] stance on front of Bosu ball for anterior tibialis activation 3x74min   Other Standing Ankle Exercises Alternating knee drive with 2-sec hold on Airex pad in // bars 3x20    Other Standing Ankle Exercises Stepping over yellow hurdles in // bars fwd x4 laps   With cues to engage anterior tib and avoid circumduction     Ankle Exercises: Seated   Toe Raise Other (comment)   YTB resisted DF on R x5 with 5sec hold                   PT Education - 10/28/20 1254     Education Details Updated HEP, discussed balance strategy    Person(s) Educated Patient    Methods Explanation;Demonstration;Handout    Comprehension Verbalized understanding;Returned demonstration              PT Short Term Goals - 10/18/20  [place] #1   Title Pt will be independent with initial HEP    Baseline no knowledge    Time 3    Period Weeks    Status New    Target Date 11/08/20      PT SHORT TERM GOAL #2   Title Pt  will increase DF of Left foot 10 degrees for better foot clearance in gait    Baseline -30 DF Left eval    Time 3    Period Weeks    Status New    Target Date 11/08/20      PT SHORT TERM GOAL #3   Title Pt will be gait trained with safest AD. ( presently with cane) to prevent falls and assess need for DF assist (Ossur soft ankle DF assist brace)    Baseline has cane but continues to fall due to Left foot drop.    Time 3    Period Weeks    Status New    Target Date 11/08/20               PT Long Term Goals - 10/18/20 1750       PT LONG TERM GOAL #1   Title Pt will be indepedent with advanced HEP    Baseline no exercise    Time 8    Period Weeks    Status New    Target Date 12/13/20      PT LONG TERM GOAL #2   Title Pt will be  able to increase DF to at lease neutral to decrease of risk of fall    Baseline eval -30    Time 8    Period Weeks    Status New    Target Date 12/13/20      PT LONG TERM GOAL #3   Title Pt will be able to squat and pick up  10 lb groceries without fear of falling    Baseline Pt fearful of picking up items off of ground due to fear of falling    Period Weeks    Status New    Target Date 12/13/20      PT LONG TERM GOAL #4   Title Pt will demostrate floor to standing transfer independently to show fall preparedness and reduce fear of  falling    Baseline Pt reluctant to leave home due to fear of falling and unable to get back up    Time 8    Period Weeks    Status New    Target Date 12/13/20      PT LONG TERM GOAL #5   Title FOTO will improve from49%    to   66%  indicating improved functional mobility.    Baseline eval 49%    Time 8    Period Weeks    Status New    Target Date 12/13/20      PT LONG TERM GOAL #6   Title Pt will incorporate regular exercise such as 30 min of walking 3-5 x a week to reduce sedentary lifestyle contributing to deconditoning and LE weakness    Baseline no knowledge, pt is smoker and sedentary    Time 8    Period Weeks    Status New    Target Date 12/13/20                   Plan - 10/28/20 1304     Clinical Impression  Statement Pt responded well to all interventions today, demonstrating proper form and no increase in pain with selected exercises. She reports feeling a burn in her BIL anterior tibialis muscles when performing BOSU ball exercise. She states this is the muscle she thinks needs to be strengthened because it will occasionally cramp on her in bed. She improved her obstacle clearance with verbal cuing to avoid hip circumduction. Additionally, the pt will likely benefit from hip strengthening as she demonstrates positive Trendelenburg's sign in BIL SLS. The pt will continue to benefit from skilled PT to address her primary  impairments and return to her prior level of function with less limitation.    Personal Factors and Comorbidities Comorbidity 3+    Comorbidities Hx of ruptured cerebral aneurysm in 2005 with repeated repair in 2012, hx of seizure order on keppra - no recent seizures - last in 2008-  Followed by [facility].  CABG march 1999, saphenous vein removal in L LE. Pt  works from home for [employer]. Hyper liidemia, Hypothyroidism, mass on kidney, CAD, CKD    Examination-Activity Limitations Stairs;Squat;Carry;Locomotion Level;Stand    Examination-Participation Restrictions Occupation;Laundry    Stability/Clinical Decision Making Evolving/Moderate complexity    Clinical Decision Making Moderate    Rehab Potential Good    PT Frequency 2x / week    PT Duration 8 weeks    PT Treatment/Interventions ADLs/Self Care Home Management;Cryotherapy;Electrical Stimulation;Iontophoresis 4mg /ml Dexamethasone;Moist Heat;Balance training;Therapeutic exercise;Therapeutic activities;Functional mobility training;Stair training;Gait training;Neuromuscular re-education;Manual techniques;Patient/family education;Orthotic Fit/Training;Passive range of motion;Dry needling;Taping;Joint Manipulations    PT Next Visit Plan balance assessment, SLS, gait with cane on step.  possible need for Ossur brace    PT Home Exercise Plan Access Code: [email or web address]    Consulted and Agree with Plan of Care Patient             Patient will benefit from skilled therapeutic intervention in order to improve the following deficits and impairments:  Pain, Impaired sensation, Abnormal gait, Decreased activity tolerance, Decreased mobility, Decreased range of motion, Decreased strength, Increased edema  Visit Diagnosis: Pain in left foot  Foot drop, left  Muscle weakness (generalized)  Other abnormalities of gait and mobility     Problem List Patient Active Problem List   Diagnosis Date Noted   Elevated LFTs 09/28/2020    Acute kidney injury ([place]) 09/28/2020   Complex renal cyst 09/28/2020   Multiple falls 09/27/2020   Left foot drop 09/27/2020   History of basal cell carcinoma (BCC) 05/04/2020   Atypical nevus of right lower back 04/20/2019   Emphysema of lung ([place]) 08/25/2018   Aortic atherosclerosis ([place]) 08/25/2018   Cholelithiases 08/25/2018   Claudication of left lower extremity ([place]) 04/02/2018   Anemia in chronic kidney disease 02/01/2017   Overweight (BMI 25.0-29.9) 12/02/2014   Medication management 06/25/2014   Poor compliance 10/09/2013   Vitamin D deficiency    SMOKER 08/17/2009   Hypothyroidism 08/16/2009   Hyperlipidemia 08/16/2009   Essential hypertension 08/16/2009   CAD (coronary artery disease) 08/16/2009   History of CVA (cerebrovascular accident) 08/16/2009   Peripheral vascular disease ([place]) 08/16/2009   CKD (chronic kidney disease) stage 4, GFR 15-29 ml/min ([place]) 08/16/2009   Seizure disorder ([place]) 08/16/2009    Vanessa Prairie, PT, DPT 10/28/20 1:08 PM   [facility] [address] [place], [place], [address] Phone: [phone]   Fax:  [phone]  Name: Olivia Walsh MRN: [id number] Date of Birth: [birth year]

## 2020-10-28 NOTE — Patient Instructions (Signed)
  [email or web address]

## 2020-11-01 ENCOUNTER — Ambulatory Visit: Payer: 59

## 2020-11-02 ENCOUNTER — Other Ambulatory Visit: Payer: Self-pay

## 2020-11-02 ENCOUNTER — Ambulatory Visit: Payer: 59 | Admitting: Internal Medicine

## 2020-11-02 VITALS — Wt 167.0 lb

## 2020-11-02 DIAGNOSIS — C44519 Basal cell carcinoma of skin of other part of trunk: Secondary | ICD-10-CM

## 2020-11-02 NOTE — Progress Notes (Signed)
   Path of lesion excised Rt mid lateral back on 8/25 showed a BCE with (+) margin.   Sutures removed   Patient aware to schedule OV for re-excision

## 2020-11-03 ENCOUNTER — Ambulatory Visit: Payer: 59

## 2020-11-08 ENCOUNTER — Other Ambulatory Visit: Payer: Self-pay

## 2020-11-08 ENCOUNTER — Ambulatory Visit: Payer: 59

## 2020-11-08 DIAGNOSIS — M21372 Foot drop, left foot: Secondary | ICD-10-CM

## 2020-11-08 DIAGNOSIS — M79672 Pain in left foot: Secondary | ICD-10-CM | POA: Diagnosis not present

## 2020-11-08 DIAGNOSIS — M6281 Muscle weakness (generalized): Secondary | ICD-10-CM

## 2020-11-08 DIAGNOSIS — R2689 Other abnormalities of gait and mobility: Secondary | ICD-10-CM

## 2020-11-08 NOTE — Therapy (Signed)
[place], [place], [address] Phone: [phone]   Fax:  [phone]  Physical Therapy Treatment  Patient Details  Name: Olivia Walsh MRN: [id number] Date of Birth: May 13, 1960 Referring Provider (PT): Liane Comber NP   Encounter Date: 11/08/2020   PT End of Session - 11/08/20 1523     Visit Number 3    Number of Visits 16    Date for PT Re-Evaluation 12/13/20    Authorization Type UHC    PT Start Time 6144    PT Stop Time 1610    PT Time Calculation (min) 40 min    Equipment Utilized During Treatment Gait belt    Activity Tolerance Patient tolerated treatment well    Behavior During Therapy [facility] for tasks assessed/performed             Past Medical History:  Diagnosis Date   Aneurysm ([place])    CAD (coronary artery disease)    CKD (chronic kidney disease) stage 3, GFR 30-59 ml/min ([place])    History of basal cell carcinoma excision 12/20/2017   Hyperlipidemia LDL goal <70    Hypertension    Hypothyroidism    Tobacco abuse    Vitamin D deficiency     Past Surgical History:  Procedure Laterality Date   CATARACT EXTRACTION, BILATERAL Bilateral 2020   CEREBRAL ANEURYSM REPAIR Right 2012   CEREBRAL ANEURYSM REPAIR Right 2005   CORONARY ARTERY BYPASS GRAFT  2002   Dr Cyndia Bent   EMBOLIZATION  2005   Embolization of right PICA aneurysm    There were no vitals filed for this visit.   Subjective Assessment - 11/08/20 1523     Subjective Pt presents to PT with reports of newer onset of R hip pain on 10/29/20 after her last PT treatment. She notes that she has had R hip pain in the past, but this had not bothered her in the last year. Denies any falls or known injury to R side recently. Pt has been fairly compliant with her HEP per reports. She is ready to begin PT at this time.    Currently in Pain? Yes    Pain Score 4     Pain Location Hip    Pain Orientation Right           [facility]  Adult PT Treatment/Exercise:   Therapeutic Exercise:  NuStep lvl 6 x 4 min UE/LE while taking subjective Bridge 2x10 - 3 sec hold  Thomas stretch 2 x 60 sec R L ankle DF YTB 2x15 Neuromuscular Re-Ed: L SLS on foam x 30 sec Tandem on foam L back 3x30 sec Hurdle step overs 6 - fwd x 4 in // Wobble board fwd/bwd 2 x 15 - bil UE support                               PT Short Term Goals - 10/18/20 1744       PT SHORT TERM GOAL #1   Title Pt will be independent with initial HEP    Baseline no knowledge    Time 3    Period Weeks    Status New    Target Date 11/08/20      PT SHORT TERM GOAL #2   Title Pt  will increase DF of Left foot 10 degrees for better foot clearance in gait    Baseline -30 DF Left eval  Time 3    Period Weeks    Status New    Target Date 11/08/20      PT SHORT TERM GOAL #3   Title Pt will be gait trained with safest AD. ( presently with cane) to prevent falls and assess need for DF assist (Ossur soft ankle DF assist brace)    Baseline has cane but continues to fall due to Left foot drop.    Time 3    Period Weeks    Status New    Target Date 11/08/20               PT Long Term Goals - 10/18/20 1750       PT LONG TERM GOAL #1   Title Pt will be indepedent with advanced HEP    Baseline no exercise    Time 8    Period Weeks    Status New    Target Date 12/13/20      PT LONG TERM GOAL #2   Title Pt will be able to increase DF to at lease neutral to decrease of risk of fall    Baseline eval -30    Time 8    Period Weeks    Status New    Target Date 12/13/20      PT LONG TERM GOAL #3   Title Pt will be able to squat and pick up  10 lb groceries without fear of falling    Baseline Pt fearful of picking up items off of ground due to fear of falling    Period Weeks    Status New    Target Date 12/13/20      PT LONG TERM GOAL #4   Title Pt will demostrate floor to standing transfer independently to show fall  preparedness and reduce fear of  falling    Baseline Pt reluctant to leave home due to fear of falling and unable to get back up    Time 8    Period Weeks    Status New    Target Date 12/13/20      PT LONG TERM GOAL #5   Title FOTO will improve from49%    to   66%  indicating improved functional mobility.    Baseline eval 49%    Time 8    Period Weeks    Status New    Target Date 12/13/20      PT LONG TERM GOAL #6   Title Pt will incorporate regular exercise such as 30 min of walking 3-5 x a week to reduce sedentary lifestyle contributing to deconditoning and LE weakness    Baseline no knowledge, pt is smoker and sedentary    Time 8    Period Weeks    Status New    Target Date 12/13/20                   Plan - 11/08/20 1537     Clinical Impression Statement Pt again responded well to interventions today, showing improving balance and activity tolerance. HEP updated to include proximal hip strengthening and stretching exercises d/t increased R hip pain. Will trial Estim to L LE next session for improving DF return. Will continue to progress as tolerated per POC.    PT Treatment/Interventions ADLs/Self Care Home Management;Cryotherapy;Electrical Stimulation;Iontophoresis 4mg /ml Dexamethasone;Moist Heat;Balance training;Therapeutic exercise;Therapeutic activities;Functional mobility training;Stair training;Gait training;Neuromuscular re-education;Manual techniques;Patient/family education;Orthotic Fit/Training;Passive range of motion;Dry needling;Taping;Joint Manipulations    PT Next Visit Plan [place] ESTIM to L  tiba ant, gait with cane on step.  possible need for Ossur brace    PT Home Exercise Plan Access Code: [email or web address]             Patient will benefit from skilled therapeutic intervention in order to improve the following deficits and impairments:  Pain, Impaired sensation, Abnormal gait, Decreased activity tolerance, Decreased mobility, Decreased range of motion,  Decreased strength, Increased edema  Visit Diagnosis: Pain in left foot  Foot drop, left  Muscle weakness (generalized)  Other abnormalities of gait and mobility     Problem List Patient Active Problem List   Diagnosis Date Noted   Elevated LFTs 09/28/2020   Acute kidney injury ([place]) 09/28/2020   Complex renal cyst 09/28/2020   Multiple falls 09/27/2020   Left foot drop 09/27/2020   History of basal cell carcinoma (BCC) 05/04/2020   Atypical nevus of right lower back 04/20/2019   Emphysema of lung ([place]) 08/25/2018   Aortic atherosclerosis ([place]) 08/25/2018   Cholelithiases 08/25/2018   Claudication of left lower extremity ([place]) 04/02/2018   Anemia in chronic kidney disease 02/01/2017   Overweight (BMI 25.0-29.9) 12/02/2014   Medication management 06/25/2014   Poor compliance 10/09/2013   Vitamin D deficiency    SMOKER 08/17/2009   Hypothyroidism 08/16/2009   Hyperlipidemia 08/16/2009   Essential hypertension 08/16/2009   CAD (coronary artery disease) 08/16/2009   History of CVA (cerebrovascular accident) 08/16/2009   Peripheral vascular disease ([place]) 08/16/2009   CKD (chronic kidney disease) stage 4, GFR 15-29 ml/min ([place]) 08/16/2009   Seizure disorder ([place]) 08/16/2009    Ward Chatters, PT 11/08/2020, 4:13 PM  [place] [facility] [address] [place], [place], [address] Phone: [phone]   Fax:  [phone]  Name: Olivia Walsh MRN: [id number] Date of Birth: 1960/12/28

## 2020-11-10 ENCOUNTER — Ambulatory Visit: Payer: 59

## 2020-11-10 ENCOUNTER — Other Ambulatory Visit: Payer: Self-pay

## 2020-11-10 DIAGNOSIS — M79672 Pain in left foot: Secondary | ICD-10-CM

## 2020-11-10 DIAGNOSIS — M21372 Foot drop, left foot: Secondary | ICD-10-CM

## 2020-11-10 DIAGNOSIS — R2689 Other abnormalities of gait and mobility: Secondary | ICD-10-CM

## 2020-11-10 DIAGNOSIS — M6281 Muscle weakness (generalized): Secondary | ICD-10-CM

## 2020-11-10 NOTE — Therapy (Signed)
[place], [place], [address] Phone: [phone]   Fax:  [phone]  Physical Therapy Treatment  Patient Details  Name: Olivia Walsh MRN: [id number] Date of Birth: 1960/12/30 Referring Provider (PT): Liane Comber NP   Encounter Date: 11/10/2020   PT End of Session - 11/10/20 1542     Visit Number 4    Number of Visits 16    Date for PT Re-Evaluation 12/13/20    Authorization Type UHC    PT Start Time 5170    PT Stop Time 0174    PT Time Calculation (min) 42 min    Equipment Utilized During Treatment Gait belt    Activity Tolerance Patient tolerated treatment well    Behavior During Therapy WFL for tasks assessed/performed             Past Medical History:  Diagnosis Date   Aneurysm ([place])    CAD (coronary artery disease)    CKD (chronic kidney disease) stage 3, GFR 30-59 ml/min ([place])    History of basal cell carcinoma excision 12/20/2017   Hyperlipidemia LDL goal <70    Hypertension    Hypothyroidism    Tobacco abuse    Vitamin D deficiency     Past Surgical History:  Procedure Laterality Date   CATARACT EXTRACTION, BILATERAL Bilateral 2020   CEREBRAL ANEURYSM REPAIR Right 2012   CEREBRAL ANEURYSM REPAIR Right 2005   CORONARY ARTERY BYPASS GRAFT  2002   Dr Cyndia Bent   EMBOLIZATION  2005   Embolization of right PICA aneurysm    There were no vitals filed for this visit.   Subjective Assessment - 11/10/20 1542     Subjective Pt presents to PT with reports of decreased R hip pain after last treatment session. She notes that the stretches seemed to have improved symptoms in R hip. She has been compliant with HEP with no adverse effect. Ready to begin PT at this time.    Currently in Pain? No/denies    Pain Score 0-No pain           [facility] Adult PT Treatment/Exercise:   Therapeutic Exercise(NOT TODAY):  NuStep lvl 6 x 4 min UE/LE while taking subjective Bridge 2x10 - 3  sec hold  Thomas stretch 2 x 60 sec R L ankle DF YTB 2x15 Neuromuscular Re-Ed: L SLS on foam 2 x 30 sec Tandem on foam L back 3x30 sec Hurdle step overs 6 - fwd x 4 in // [employer] fwd/bwd 2 x 15 - bil UE support [place] ESTIM; 24opt, 31min, 10sec on/off - L ant tib with active DF in on cycle Leg press into calf raise and slow eccentric return to past neutral DF 2x15 10lbs                             PT Education - 11/10/20 1628     Education Details bracing options for L foot/ankle    Person(s) Educated Patient    Methods Explanation    Comprehension Verbalized understanding;Returned demonstration              PT Short Term Goals - 10/18/20 1744       PT SHORT TERM GOAL #1   Title Pt will be independent with initial HEP    Baseline no knowledge    Time 3    Period Weeks    Status New    Target Date 11/08/20  PT SHORT TERM GOAL #2   Title Pt  will increase DF of Left foot 10 degrees for better foot clearance in gait    Baseline -30 DF Left eval    Time 3    Period Weeks    Status New    Target Date 11/08/20      PT SHORT TERM GOAL #3   Title Pt will be gait trained with safest AD. ( presently with cane) to prevent falls and assess need for DF assist (Ossur soft ankle DF assist brace)    Baseline has cane but continues to fall due to Left foot drop.    Time 3    Period Weeks    Status New    Target Date 11/08/20               PT Long Term Goals - 10/18/20 1750       PT LONG TERM GOAL #1   Title Pt will be indepedent with advanced HEP    Baseline no exercise    Time 8    Period Weeks    Status New    Target Date 12/13/20      PT LONG TERM GOAL #2   Title Pt will be able to increase DF to at lease neutral to decrease of risk of fall    Baseline eval -30    Time 8    Period Weeks    Status New    Target Date 12/13/20      PT LONG TERM GOAL #3   Title Pt will be able to squat and pick up  10 lb groceries without  fear of falling    Baseline Pt fearful of picking up items off of ground due to fear of falling    Period Weeks    Status New    Target Date 12/13/20      PT LONG TERM GOAL #4   Title Pt will demostrate floor to standing transfer independently to show fall preparedness and reduce fear of  falling    Baseline Pt reluctant to leave home due to fear of falling and unable to get back up    Time 8    Period Weeks    Status New    Target Date 12/13/20      PT LONG TERM GOAL #5   Title FOTO will improve from49%    to   66%  indicating improved functional mobility.    Baseline eval 49%    Time 8    Period Weeks    Status New    Target Date 12/13/20      PT LONG TERM GOAL #6   Title Pt will incorporate regular exercise such as 30 min of walking 3-5 x a week to reduce sedentary lifestyle contributing to deconditoning and LE weakness    Baseline no knowledge, pt is smoker and sedentary    Time 8    Period Weeks    Status New    Target Date 12/13/20                   Plan - 11/10/20 1558     Clinical Impression Statement Pt was able to complete to prescribed exercises with no adverse effect or change in pain. Today's session also incorporated [place] ESTIM to improve motor firing to L ant tib. PT also discussed bracing options for L foot to improve safety secondary to foot drop. PT will continue to progress pt as tolerated  per POC.    PT Treatment/Interventions ADLs/Self Care Home Management;Cryotherapy;Electrical Stimulation;Iontophoresis 4mg /ml Dexamethasone;Moist Heat;Balance training;Therapeutic exercise;Therapeutic activities;Functional mobility training;Stair training;Gait training;Neuromuscular re-education;Manual techniques;Patient/family education;Orthotic Fit/Training;Passive range of motion;Dry needling;Taping;Joint Manipulations    PT Next Visit Plan [place] ESTIM to L tiba ant, gait with cane on step.  possible need for Ossur brace    PT Home Exercise Plan Access Code:  [email or web address]             Patient will benefit from skilled therapeutic intervention in order to improve the following deficits and impairments:  Pain, Impaired sensation, Abnormal gait, Decreased activity tolerance, Decreased mobility, Decreased range of motion, Decreased strength, Increased edema  Visit Diagnosis: Pain in left foot  Foot drop, left  Muscle weakness (generalized)  Other abnormalities of gait and mobility     Problem List Patient Active Problem List   Diagnosis Date Noted   Elevated LFTs 09/28/2020   Acute kidney injury ([place]) 09/28/2020   Complex renal cyst 09/28/2020   Multiple falls 09/27/2020   Left foot drop 09/27/2020   History of basal cell carcinoma (BCC) 05/04/2020   Atypical nevus of right lower back 04/20/2019   Emphysema of lung ([place]) 08/25/2018   Aortic atherosclerosis ([place]) 08/25/2018   Cholelithiases 08/25/2018   Claudication of left lower extremity ([place]) 04/02/2018   Anemia in chronic kidney disease 02/01/2017   Overweight (BMI 25.0-29.9) 12/02/2014   Medication management 06/25/2014   Poor compliance 10/09/2013   Vitamin D deficiency    SMOKER 08/17/2009   Hypothyroidism 08/16/2009   Hyperlipidemia 08/16/2009   Essential hypertension 08/16/2009   CAD (coronary artery disease) 08/16/2009   History of CVA (cerebrovascular accident) 08/16/2009   Peripheral vascular disease ([place]) 08/16/2009   CKD (chronic kidney disease) stage 4, GFR 15-29 ml/min ([place]) 08/16/2009   Seizure disorder ([place]) 08/16/2009    Ward Chatters, PT 11/10/2020, 4:30 PM  [place] [facility] [address] [place], [place], [address] Phone: [phone]   Fax:  [phone]  Name: Olivia Walsh MRN: [id number] Date of Birth: 1960/03/28

## 2020-11-15 ENCOUNTER — Ambulatory Visit: Payer: 59

## 2020-11-16 ENCOUNTER — Encounter: Payer: 59 | Admitting: Internal Medicine

## 2020-11-17 ENCOUNTER — Other Ambulatory Visit: Payer: Self-pay

## 2020-11-17 ENCOUNTER — Ambulatory Visit: Payer: 59

## 2020-11-17 DIAGNOSIS — M6281 Muscle weakness (generalized): Secondary | ICD-10-CM

## 2020-11-17 DIAGNOSIS — R2689 Other abnormalities of gait and mobility: Secondary | ICD-10-CM

## 2020-11-17 DIAGNOSIS — M21372 Foot drop, left foot: Secondary | ICD-10-CM

## 2020-11-17 DIAGNOSIS — M79672 Pain in left foot: Secondary | ICD-10-CM | POA: Diagnosis not present

## 2020-11-17 NOTE — Therapy (Addendum)
[place], [place], [address] Phone: [phone]   Fax:  [phone]  Physical Therapy Treatment  Patient Details  Name: Olivia Walsh MRN: [id number] Date of Birth: September 08, 1960 Referring Provider (PT): Liane Comber NP   Encounter Date: 11/17/2020   PT End of Session - 11/17/20 1531     Visit Number 5    Number of Visits 16    Date for PT Re-Evaluation 12/13/20    Authorization Type UHC    PT Start Time 6962    PT Stop Time 9528    PT Time Calculation (min) 44 min    Equipment Utilized During Treatment Gait belt    Activity Tolerance Patient tolerated treatment well    Behavior During Therapy [facility] for tasks assessed/performed             Past Medical History:  Diagnosis Date   Aneurysm ([place])    CAD (coronary artery disease)    CKD (chronic kidney disease) stage 3, GFR 30-59 ml/min ([place])    History of basal cell carcinoma excision 12/20/2017   Hyperlipidemia LDL goal <70    Hypertension    Hypothyroidism    Tobacco abuse    Vitamin D deficiency     Past Surgical History:  Procedure Laterality Date   CATARACT EXTRACTION, BILATERAL Bilateral 2020   CEREBRAL ANEURYSM REPAIR Right 2012   CEREBRAL ANEURYSM REPAIR Right 2005   CORONARY ARTERY BYPASS GRAFT  2002   Dr Cyndia Bent   EMBOLIZATION  2005   Embolization of right PICA aneurysm    There were no vitals filed for this visit.   Subjective Assessment - 11/17/20 1531     Subjective Pt presents to PT with lace up foot drop brace, noting that it seems to greatly improve her balance during gait when donned. She has been compliant with HEP with no adverse effect. Pt is ready to begin PT treatment at this time.    Currently in Pain? No/denies    Pain Score 0-No pain           [facility] Adult PT Treatment/Exercise:   Therapeutic Exercise:  L ankle DF YTB x 10 L ankle PF with slow return to DF x 20 YTB  Past Interventions Not  Performed Today: NuStep lvl 6 x 4 min UE/LE while taking subjective Bridge 2x10 - 3 sec hold  Thomas stretch 2 x 60 sec R  Neuromuscular Re-Ed: L SLS on foam 2 x 15 sec Tandem on foam L back 3x30 sec Wobble board fwd/bwd 2 x 20 - bil UE support [place] ESTIM; 24opt, 42min, 10sec on/off - L ant tib with active DF in on cycle Leg press into calf raise and slow eccentric return to past neutral DF 2x15 10lbs (NOT TODAY)                               PT Short Term Goals - 10/18/20 1744       PT SHORT TERM GOAL #1   Title Pt will be independent with initial HEP    Baseline no knowledge    Time 3    Period Weeks    Status New    Target Date 11/08/20      PT SHORT TERM GOAL #2   Title Pt  will increase DF of Left foot 10 degrees for better foot clearance in gait    Baseline -30 DF Left eval  Time 3    Period Weeks    Status New    Target Date 11/08/20      PT SHORT TERM GOAL #3   Title Pt will be gait trained with safest AD. ( presently with cane) to prevent falls and assess need for DF assist (Ossur soft ankle DF assist brace)    Baseline has cane but continues to fall due to Left foot drop.    Time 3    Period Weeks    Status New    Target Date 11/08/20               PT Long Term Goals - 10/18/20 1750       PT LONG TERM GOAL #1   Title Pt will be indepedent with advanced HEP    Baseline no exercise    Time 8    Period Weeks    Status New    Target Date 12/13/20      PT LONG TERM GOAL #2   Title Pt will be able to increase DF to at lease neutral to decrease of risk of fall    Baseline eval -30    Time 8    Period Weeks    Status New    Target Date 12/13/20      PT LONG TERM GOAL #3   Title Pt will be able to squat and pick up  10 lb groceries without fear of falling    Baseline Pt fearful of picking up items off of ground due to fear of falling    Period Weeks    Status New    Target Date 12/13/20      PT LONG TERM GOAL #4    Title Pt will demostrate floor to standing transfer independently to show fall preparedness and reduce fear of  falling    Baseline Pt reluctant to leave home due to fear of falling and unable to get back up    Time 8    Period Weeks    Status New    Target Date 12/13/20      PT LONG TERM GOAL #5   Title FOTO will improve from49%    to   66%  indicating improved functional mobility.    Baseline eval 49%    Time 8    Period Weeks    Status New    Target Date 12/13/20      PT LONG TERM GOAL #6   Title Pt will incorporate regular exercise such as 30 min of walking 3-5 x a week to reduce sedentary lifestyle contributing to deconditoning and LE weakness    Baseline no knowledge, pt is smoker and sedentary    Time 8    Period Weeks    Status New    Target Date 12/13/20                   Plan - 11/17/20 1621     Clinical Impression Statement Pt was able to complete prescribed exercises and demonstrates improving L ankle stability and slight firing of L tib anterior. Pt is progressing well with therapy, with today's session again focusing on improving strength and neuromuscular control of distal L LE musculature. Will continue to progress as tolerated per POC.    PT Treatment/Interventions ADLs/Self Care Home Management;Cryotherapy;Electrical Stimulation;Iontophoresis 4mg /ml Dexamethasone;Moist Heat;Balance training;Therapeutic exercise;Therapeutic activities;Functional mobility training;Stair training;Gait training;Neuromuscular re-education;Manual techniques;Patient/family education;Orthotic Fit/Training;Passive range of motion;Dry needling;Taping;Joint Manipulations    PT Next Visit Plan [place] ESTIM  to L tiba ant, gait with cane on step    PT Home Exercise Plan Access Code: [email or web address]             Patient will benefit from skilled therapeutic intervention in order to improve the following deficits and impairments:  Pain, Impaired sensation, Abnormal gait, Decreased  activity tolerance, Decreased mobility, Decreased range of motion, Decreased strength, Increased edema  Visit Diagnosis: Pain in left foot  Foot drop, left  Muscle weakness (generalized)  Other abnormalities of gait and mobility     Problem List Patient Active Problem List   Diagnosis Date Noted   Elevated LFTs 09/28/2020   Acute kidney injury ([place]) 09/28/2020   Complex renal cyst 09/28/2020   Multiple falls 09/27/2020   Left foot drop 09/27/2020   History of basal cell carcinoma (BCC) 05/04/2020   Atypical nevus of right lower back 04/20/2019   Emphysema of lung ([place]) 08/25/2018   Aortic atherosclerosis ([place]) 08/25/2018   Cholelithiases 08/25/2018   Claudication of left lower extremity ([place]) 04/02/2018   Anemia in chronic kidney disease 02/01/2017   Overweight (BMI 25.0-29.9) 12/02/2014   Medication management 06/25/2014   Poor compliance 10/09/2013   Vitamin D deficiency    SMOKER 08/17/2009   Hypothyroidism 08/16/2009   Hyperlipidemia 08/16/2009   Essential hypertension 08/16/2009   CAD (coronary artery disease) 08/16/2009   History of CVA (cerebrovascular accident) 08/16/2009   Peripheral vascular disease ([place]) 08/16/2009   CKD (chronic kidney disease) stage 4, GFR 15-29 ml/min ([place]) 08/16/2009   Seizure disorder ([place]) 08/16/2009    Ward Chatters, PT 11/17/2020, 4:23 PM  [facility] [address] [place], [place], [address] Phone: [phone]   Fax:  [phone]  Name: Olivia Walsh MRN: [id number] Date of Birth: 10-29-1960

## 2020-11-22 ENCOUNTER — Ambulatory Visit: Payer: 59 | Admitting: Physical Therapy

## 2020-11-24 ENCOUNTER — Other Ambulatory Visit: Payer: Self-pay

## 2020-11-24 ENCOUNTER — Ambulatory Visit: Payer: 59

## 2020-11-24 DIAGNOSIS — R2689 Other abnormalities of gait and mobility: Secondary | ICD-10-CM

## 2020-11-24 DIAGNOSIS — M21372 Foot drop, left foot: Secondary | ICD-10-CM

## 2020-11-24 DIAGNOSIS — M6281 Muscle weakness (generalized): Secondary | ICD-10-CM

## 2020-11-24 DIAGNOSIS — M79672 Pain in left foot: Secondary | ICD-10-CM

## 2020-11-24 NOTE — Therapy (Signed)
[place], [place], [address] Phone: [phone]   Fax:  [phone]  Physical Therapy Treatment  Patient Details  Name: Olivia Walsh MRN: [id number] Date of Birth: 11/18/1960 Referring Provider (PT): Liane Comber NP   Encounter Date: 11/24/2020   PT End of Session - 11/24/20 1528     Visit Number 6    Number of Visits 16    Date for PT Re-Evaluation 12/13/20    Authorization Type UHC    PT Start Time 8182    PT Stop Time 1612    PT Time Calculation (min) 42 min    Equipment Utilized During Treatment Gait belt    Activity Tolerance Patient tolerated treatment well    Behavior During Therapy [facility] for tasks assessed/performed             Past Medical History:  Diagnosis Date   Aneurysm ([place])    CAD (coronary artery disease)    CKD (chronic kidney disease) stage 3, GFR 30-59 ml/min ([place])    History of basal cell carcinoma excision 12/20/2017   Hyperlipidemia LDL goal <70    Hypertension    Hypothyroidism    Tobacco abuse    Vitamin D deficiency     Past Surgical History:  Procedure Laterality Date   CATARACT EXTRACTION, BILATERAL Bilateral 2020   CEREBRAL ANEURYSM REPAIR Right 2012   CEREBRAL ANEURYSM REPAIR Right 2005   CORONARY ARTERY BYPASS GRAFT  2002   Dr Cyndia Bent   EMBOLIZATION  2005   Embolization of right PICA aneurysm    There were no vitals filed for this visit.   Subjective Assessment - 11/24/20 1535     Subjective Pt presents to PT with no current reports of pain or discomfort. Has continued to be compliant with her HEP with no adverse effect. Ready to begin PT treatment at this time.    Currently in Pain? No/denies    Pain Score 0-No pain           [facility] Adult PT Treatment/Exercise:   Therapeutic Exercise:  NuStep lvl 6 x 3 min LE while taking subjective L ankle DF YTB x 10 L ankle PF with slow return to DF 2x15 red tband   Past Interventions Not  Performed Today: Bridge 2x10 - 3 sec hold Thomas stretch 2 x 60 sec R   Neuromuscular Re-Ed: L SLS on foam 2x15 sec L SLS 2x30 sec Tandem on foam L back 3x30 sec Wobble board fwd/bwd 2 x 20 - bil UE support [place] ESTIM; 24opt, 42min, 10sec on/off - L ant tib with active DF in on cycle Leg press into calf raise and slow eccentric return to past neutral DF 2x15 10lbs (NOT TODAY)                               PT Short Term Goals - 10/18/20 1744       PT SHORT TERM GOAL #1   Title Pt will be independent with initial HEP    Baseline no knowledge    Time 3    Period Weeks    Status New    Target Date 11/08/20      PT SHORT TERM GOAL #2   Title Pt  will increase DF of Left foot 10 degrees for better foot clearance in gait    Baseline -30 DF Left eval    Time 3  Period Weeks    Status New    Target Date 11/08/20      PT SHORT TERM GOAL #3   Title Pt will be gait trained with safest AD. ( presently with cane) to prevent falls and assess need for DF assist (Ossur soft ankle DF assist brace)    Baseline has cane but continues to fall due to Left foot drop.    Time 3    Period Weeks    Status New    Target Date 11/08/20               PT Long Term Goals - 10/18/20 1750       PT LONG TERM GOAL #1   Title Pt will be indepedent with advanced HEP    Baseline no exercise    Time 8    Period Weeks    Status New    Target Date 12/13/20      PT LONG TERM GOAL #2   Title Pt will be able to increase DF to at lease neutral to decrease of risk of fall    Baseline eval -30    Time 8    Period Weeks    Status New    Target Date 12/13/20      PT LONG TERM GOAL #3   Title Pt will be able to squat and pick up  10 lb groceries without fear of falling    Baseline Pt fearful of picking up items off of ground due to fear of falling    Period Weeks    Status New    Target Date 12/13/20      PT LONG TERM GOAL #4   Title Pt will demostrate floor to  standing transfer independently to show fall preparedness and reduce fear of  falling    Baseline Pt reluctant to leave home due to fear of falling and unable to get back up    Time 8    Period Weeks    Status New    Target Date 12/13/20      PT LONG TERM GOAL #5   Title FOTO will improve from49%    to   66%  indicating improved functional mobility.    Baseline eval 49%    Time 8    Period Weeks    Status New    Target Date 12/13/20      PT LONG TERM GOAL #6   Title Pt will incorporate regular exercise such as 30 min of walking 3-5 x a week to reduce sedentary lifestyle contributing to deconditoning and LE weakness    Baseline no knowledge, pt is smoker and sedentary    Time 8    Period Weeks    Status New    Target Date 12/13/20                   Plan - 11/24/20 1552     Clinical Impression Statement Pt was able to complete all prescribed exercises with no adverse effect, but did have some discomfort and cramping in R distal quad post NuStep after [place] ESTIM to L LE. Pt shows improving distal L LE and L ankle activation. Will continue to progress as able per POC.    PT Treatment/Interventions ADLs/Self Care Home Management;Cryotherapy;Electrical Stimulation;Iontophoresis 4mg /ml Dexamethasone;Moist Heat;Balance training;Therapeutic exercise;Therapeutic activities;Functional mobility training;Stair training;Gait training;Neuromuscular re-education;Manual techniques;Patient/family education;Orthotic Fit/Training;Passive range of motion;Dry needling;Taping;Joint Manipulations    PT Next Visit Plan [place] ESTIM to L tiba ant, gait with  cane on step    PT Home Exercise Plan Access Code: [email or web address]             Patient will benefit from skilled therapeutic intervention in order to improve the following deficits and impairments:  Pain, Impaired sensation, Abnormal gait, Decreased activity tolerance, Decreased mobility, Decreased range of motion, Decreased strength, Increased  edema  Visit Diagnosis: Pain in left foot  Foot drop, left  Muscle weakness (generalized)  Other abnormalities of gait and mobility     Problem List Patient Active Problem List   Diagnosis Date Noted   Elevated LFTs 09/28/2020   Acute kidney injury ([place]) 09/28/2020   Complex renal cyst 09/28/2020   Multiple falls 09/27/2020   Left foot drop 09/27/2020   History of basal cell carcinoma (BCC) 05/04/2020   Atypical nevus of right lower back 04/20/2019   Emphysema of lung ([place]) 08/25/2018   Aortic atherosclerosis ([place]) 08/25/2018   Cholelithiases 08/25/2018   Claudication of left lower extremity ([place]) 04/02/2018   Anemia in chronic kidney disease 02/01/2017   Overweight (BMI 25.0-29.9) 12/02/2014   Medication management 06/25/2014   Poor compliance 10/09/2013   Vitamin D deficiency    SMOKER 08/17/2009   Hypothyroidism 08/16/2009   Hyperlipidemia 08/16/2009   Essential hypertension 08/16/2009   CAD (coronary artery disease) 08/16/2009   History of CVA (cerebrovascular accident) 08/16/2009   Peripheral vascular disease ([place]) 08/16/2009   CKD (chronic kidney disease) stage 4, GFR 15-29 ml/min ([place]) 08/16/2009   Seizure disorder ([place]) 08/16/2009    Ward Chatters, PT 11/24/2020, 4:56 PM  [place] [facility] [address] [place], [place], [address] Phone: [phone]   Fax:  [phone]  Name: Olivia Walsh MRN: [id number] Date of Birth: [birth year]

## 2020-11-29 ENCOUNTER — Encounter: Payer: Self-pay | Admitting: Physical Therapy

## 2020-11-29 ENCOUNTER — Ambulatory Visit: Payer: 59 | Attending: Adult Health | Admitting: Physical Therapy

## 2020-11-29 ENCOUNTER — Other Ambulatory Visit: Payer: Self-pay

## 2020-11-29 DIAGNOSIS — M79672 Pain in left foot: Secondary | ICD-10-CM | POA: Insufficient documentation

## 2020-11-29 DIAGNOSIS — M6281 Muscle weakness (generalized): Secondary | ICD-10-CM | POA: Diagnosis present

## 2020-11-29 DIAGNOSIS — M21372 Foot drop, left foot: Secondary | ICD-10-CM | POA: Insufficient documentation

## 2020-11-29 DIAGNOSIS — R2689 Other abnormalities of gait and mobility: Secondary | ICD-10-CM | POA: Diagnosis present

## 2020-11-29 NOTE — Therapy (Signed)
[place], [place], [address] Phone: [phone]   Fax:  [phone]  Physical Therapy Treatment  Patient Details  Name: Olivia Walsh MRN: [id number] Date of Birth: [birth year] Referring Provider (PT): Liane Comber NP   Encounter Date: 11/29/2020   PT End of Session - 11/29/20 1651     Visit Number 7    Number of Visits 16    Date for PT Re-Evaluation 12/13/20    Authorization Type UHC    PT Start Time 5035    PT Stop Time 4656    PT Time Calculation (min) 44 min    Activity Tolerance Patient tolerated treatment well    Behavior During Therapy [facility] for tasks assessed/performed             Past Medical History:  Diagnosis Date   Aneurysm ([place])    CAD (coronary artery disease)    CKD (chronic kidney disease) stage 3, GFR 30-59 ml/min ([place])    History of basal cell carcinoma excision 12/20/2017   Hyperlipidemia LDL goal <70    Hypertension    Hypothyroidism    Tobacco abuse    Vitamin D deficiency     Past Surgical History:  Procedure Laterality Date   CATARACT EXTRACTION, BILATERAL Bilateral 2020   CEREBRAL ANEURYSM REPAIR Right 2012   CEREBRAL ANEURYSM REPAIR Right 2005   CORONARY ARTERY BYPASS GRAFT  2002   Dr Cyndia Bent   EMBOLIZATION  2005   Embolization of right PICA aneurysm    There were no vitals filed for this visit.   Subjective Assessment - 11/29/20 1600     Subjective I did buy the foot up soft brace from [employer].    Pertinent History Hx of ruptured cerebral aneurysm in 2005 with repeated repair in 2012, hx of seizure order on keppra - no recent seizures - last in 2008-  Followed by [facility].  CABG march 1999, saphenous vein removal in L LE. Pt  works from home for [employer]. Hyper liidemia, Hypothyroidism, mass on kidney, CAD, CKD                  [facility] Adult PT Treatment/Exercise:   Therapeutic Exercise:  NuStep lvl 6 x 3 min LE while  taking subjective L ankle DF YTB x 10 L ankle PF with slow return to DF 2x15 red tband      Neuromuscular Re-Ed: [place] ESTIM; 24opt, 41min, 10sec on/off - L ant tib with active DF in on cycle  L SLS on foam 4 x15 sec L SLS 2x30 sec Tandem on foam L back 3x30 sec Wobble board fwd/bwd 2 x 20 - bil UE support Manual by PT overflow with R LE with overstretch at PF to activate stretch reflex. Followed by L LE stretch reflex    Past Interventions Not Performed Today: Bridge 2x10 - 3 sec hold Thomas stretch 2 x 60 sec RLeg press into calf raise and slow eccentric return to past neutral DF 2x15 10lbs (NOT TODAY)                          PT Short Term Goals - 11/29/20 1654       PT SHORT TERM GOAL #1   Title Pt will be independent with initial HEP    Time 3    Period Weeks    Status On-going    Target Date 11/08/20  PT SHORT TERM GOAL #2   Title Pt  will increase DF of Left foot 10 degrees for better foot clearance in gait    Baseline -30 DF Left eval    Time 3    Period Weeks    Status Unable to assess    Target Date 11/08/20      PT SHORT TERM GOAL #3   Title Pt will be gait trained with safest AD. ( presently with cane) to prevent falls and assess need for DF assist (Ossur soft ankle DF assist brace)    Baseline has brace but not utilizing cane today and did not bring brace.    Time 3    Period Weeks    Status On-going    Target Date 11/08/20               PT Long Term Goals - 10/18/20 1750       PT LONG TERM GOAL #1   Title Pt will be indepedent with advanced HEP    Baseline no exercise    Time 8    Period Weeks    Status New    Target Date 12/13/20      PT LONG TERM GOAL #2   Title Pt will be able to increase DF to at lease neutral to decrease of risk of fall    Baseline eval -30    Time 8    Period Weeks    Status New    Target Date 12/13/20      PT LONG TERM GOAL #3   Title Pt will be able to squat and pick up  10 lb  groceries without fear of falling    Baseline Pt fearful of picking up items off of ground due to fear of falling    Period Weeks    Status New    Target Date 12/13/20      PT LONG TERM GOAL #4   Title Pt will demostrate floor to standing transfer independently to show fall preparedness and reduce fear of  falling    Baseline Pt reluctant to leave home due to fear of falling and unable to get back up    Time 8    Period Weeks    Status New    Target Date 12/13/20      PT LONG TERM GOAL #5   Title FOTO will improve from49%    to   66%  indicating improved functional mobility.    Baseline eval 49%    Time 8    Period Weeks    Status New    Target Date 12/13/20      PT LONG TERM GOAL #6   Title Pt will incorporate regular exercise such as 30 min of walking 3-5 x a week to reduce sedentary lifestyle contributing to deconditoning and LE weakness    Baseline no knowledge, pt is smoker and sedentary    Time 8    Period Weeks    Status New    Target Date 12/13/20                   Plan - 11/29/20 1651     Clinical Impression Statement Pt was able to complete all exercises with no increase in pain or adverse effect.  Pt with no problems post ESTIM after nustep,  PT utilizing overstretch to activate stretch reflex and continuing activities with compliant and non compliant surfaces.  Pt has toe up soft brace bought from  [employer].  Pt told to utilize whenever she is walking or upright and to wear next week to clinic No goals achieved today..    Personal Factors and Comorbidities Comorbidity 3+    Comorbidities Hx of ruptured cerebral aneurysm in 2005 with repeated repair in 2012, hx of seizure order on keppra - no recent seizures - last in 2008-  Followed by [facility].  CABG march 1999, saphenous vein removal in L LE. Pt  works from home for [employer]. Hyper liidemia, Hypothyroidism, mass on kidney, CAD, CKD    Examination-Activity Limitations  Stairs;Squat;Carry;Locomotion Level;Stand    PT Frequency 2x / week    PT Duration 8 weeks    PT Treatment/Interventions ADLs/Self Care Home Management;Cryotherapy;Electrical Stimulation;Iontophoresis 4mg /ml Dexamethasone;Moist Heat;Balance training;Therapeutic exercise;Therapeutic activities;Functional mobility training;Stair training;Gait training;Neuromuscular re-education;Manual techniques;Patient/family education;Orthotic Fit/Training;Passive range of motion;Dry needling;Taping;Joint Manipulations    PT Next Visit Plan [place] ESTIM to L tiba ant, gait with cane on step, check goals next visit    PT Home Exercise Plan Access Code: [email or web address]    Consulted and Agree with Plan of Care Patient             Patient will benefit from skilled therapeutic intervention in order to improve the following deficits and impairments:  Pain, Impaired sensation, Abnormal gait, Decreased activity tolerance, Decreased mobility, Decreased range of motion, Decreased strength, Increased edema  Visit Diagnosis: Pain in left foot  Foot drop, left  Muscle weakness (generalized)  Other abnormalities of gait and mobility     Problem List Patient Active Problem List   Diagnosis Date Noted   Elevated LFTs 09/28/2020   Acute kidney injury ([place]) 09/28/2020   Complex renal cyst 09/28/2020   Multiple falls 09/27/2020   Left foot drop 09/27/2020   History of basal cell carcinoma (BCC) 05/04/2020   Atypical nevus of right lower back 04/20/2019   Emphysema of lung ([place]) 08/25/2018   Aortic atherosclerosis ([place]) 08/25/2018   Cholelithiases 08/25/2018   Claudication of left lower extremity ([place]) 04/02/2018   Anemia in chronic kidney disease 02/01/2017   Overweight (BMI 25.0-29.9) 12/02/2014   Medication management 06/25/2014   Poor compliance 10/09/2013   Vitamin D deficiency    SMOKER 08/17/2009   Hypothyroidism 08/16/2009   Hyperlipidemia 08/16/2009   Essential hypertension 08/16/2009   CAD  (coronary artery disease) 08/16/2009   History of CVA (cerebrovascular accident) 08/16/2009   Peripheral vascular disease ([place]) 08/16/2009   CKD (chronic kidney disease) stage 4, GFR 15-29 ml/min ([place]) 08/16/2009   Seizure disorder ([place]) 08/16/2009   Voncille Lo, PT, [place] Certified Exercise Expert for the Aging Adult  11/29/20 4:58 PM Phone: [phone] Fax: [place] [facility] [address] [place], [place], [address] Phone: [phone]   Fax:  [phone]  Name: Olivia Walsh MRN: [id number] Date of Birth: Sep 26, 1960

## 2020-12-06 ENCOUNTER — Ambulatory Visit: Payer: 59 | Admitting: Physical Therapy

## 2020-12-07 ENCOUNTER — Encounter: Payer: Self-pay | Admitting: Internal Medicine

## 2020-12-07 ENCOUNTER — Other Ambulatory Visit: Payer: Self-pay

## 2020-12-07 ENCOUNTER — Other Ambulatory Visit: Payer: Self-pay | Admitting: Internal Medicine

## 2020-12-07 ENCOUNTER — Ambulatory Visit (INDEPENDENT_AMBULATORY_CARE_PROVIDER_SITE_OTHER): Payer: 59 | Admitting: Internal Medicine

## 2020-12-07 VITALS — BP 128/80 | HR 80 | Temp 97.8°F | Resp 18 | Ht 67.0 in | Wt 159.4 lb

## 2020-12-07 DIAGNOSIS — I10 Essential (primary) hypertension: Secondary | ICD-10-CM | POA: Diagnosis not present

## 2020-12-07 DIAGNOSIS — C44519 Basal cell carcinoma of skin of other part of trunk: Secondary | ICD-10-CM | POA: Diagnosis not present

## 2020-12-07 NOTE — Progress Notes (Signed)
Future Appointments  Date Time Provider [place]  12/08/2020  3:30 PM Barkley Boards [facility]  12/09/2020 11:15 AM Alric Ran, MD [facility] None  12/13/2020  3:45 PM Vanetta Mulders [facility]  12/15/2020  3:30 PM Ward Chatters, PT [facility]  01/30/2021  9:30 AM [name], MD [facility] None  05/04/2021  2:00 PM Liane Comber, NP [facility] None    History of Present Illness:     Patient is a very nice 60 yo MWF with hx/o HTN, HLD who presents for recheck.   On 8/25, She had bx of a lesion , Rt mid lateral back  showed a BCE with (+) margin which needs re-excision.   Surgical pathology (Order #[id number]) on 10/20/20    Medications  Current Outpatient Medications (Endocrine & Metabolic):    levothyroxine (SYNTHROID) 75 MCG tablet, Take 1/2 tablet daily except skip Sunday. Take an empty stomach with only water for 30 minutes & no Antacid meds, Calcium or Magnesium for 4 hours & avoid Biotin  Current Outpatient Medications (Cardiovascular):    ezetimibe (ZETIA) 10 MG tablet, TAKE 1 TABLET(10 MG) BY MOUTH DAILY   labetalol (NORMODYNE) 300 MG tablet, TAKE 1 TABLET BY MOUTH TWO  TIMES DAILY   losartan (COZAAR) 50 MG tablet, Take 50 mg by mouth daily.   rosuvastatin (CRESTOR) 40 MG tablet, TAKE 1 TABLET BY MOUTH  DAILY FOR CHOLESTEROL (Patient taking differently: TAKE 1 TABLET BY MOUTH  DAILY FOR CHOLESTEROL)    Current Outpatient Medications (Hematological):    clopidogrel (PLAVIX) 75 MG tablet, Take  1 tablet  Daily  to Prevent  Blood Clots  Current Outpatient Medications (Other):    levETIRAcetam (KEPPRA) 500 MG tablet, TAKE 1 TABLET BY MOUTH TWICE DAILY TO PREVENT SEIZURES  Problem list She has Hypothyroidism; Hyperlipidemia; SMOKER; Essential hypertension; CAD (coronary artery disease); History of CVA (cerebrovascular accident); Peripheral vascular disease ([place]); CKD (chronic kidney disease) stage 4, GFR 15-29 ml/min ([place]);  Seizure disorder ([place]); Vitamin D deficiency; Poor compliance; Medication management; Overweight (BMI 25.0-29.9); Anemia in chronic kidney disease; Claudication of left lower extremity ([place]); Emphysema of lung ([place]); Aortic atherosclerosis ([place]); Cholelithiases; Atypical nevus of right lower back; History of basal cell carcinoma (BCC); Multiple falls; Left foot drop; Elevated LFTs; Acute kidney injury ([place]); and Complex renal cyst on their problem list.   Observations/Objective:  BP 128/80   Pulse 80   Temp 97.8 F (36.6 C)   Resp 18   Ht 5\' 7"  (1.702 m)   Wt 159 lb 6.4 oz (72.3 kg)   SpO2 99%   BMI 24.97 kg/m   HEENT - WNL. Neck - supple.  Chest - Clear  Cor - Nl HS. RRR w/o sig M.  Neuro -  Nl w/o focal abnormalities. Skin - There is a reddened flat  20 mm scar at recent bx site of Right mid lateral back.  Procedure ( CPT 84696)     After informed consent and aseptic prep with alcohol & local anesthesia with 2.0 ml of Marcaine 0.5%. , a  3-4 mm margin was sharply excised with a # 10 scalpel. The wound edges were approximated, aligned and everted with a running lock stitch of Nylon 3-0.   Then the wound was dressed with antibiotic ointment and covered with a Tegaderm  patch. Patient was instructed in post-op care. Otherwise to return 12-14 days for suture removal.  Assessment and Plan:  1. Essential hypertension  2. BCE (basal cell epithelioma), trunk  Follow Up Instructions:       I discussed the assessment and treatment plan with the patient. The patient was provided an opportunity to ask questions and all were answered. The patient agreed with the plan and demonstrated an understanding of the instructions.       The patient was advised to call back or seek an in-person evaluation if the symptoms worsen or if the condition fails to improve as anticipated.   Kirtland Bouchard, MD

## 2020-12-08 ENCOUNTER — Ambulatory Visit: Payer: 59

## 2020-12-08 DIAGNOSIS — M6281 Muscle weakness (generalized): Secondary | ICD-10-CM

## 2020-12-08 DIAGNOSIS — M79672 Pain in left foot: Secondary | ICD-10-CM | POA: Diagnosis not present

## 2020-12-08 DIAGNOSIS — R2689 Other abnormalities of gait and mobility: Secondary | ICD-10-CM

## 2020-12-08 DIAGNOSIS — M21372 Foot drop, left foot: Secondary | ICD-10-CM

## 2020-12-08 NOTE — Therapy (Signed)
[place], [place], [address] Phone: [phone]   Fax:  [phone]  Physical Therapy Treatment  Patient Details  Name: Olivia Walsh MRN: [id number] Date of Birth: [birth year] Referring Provider (PT): Liane Comber NP   Encounter Date: 12/08/2020   PT End of Session - 12/08/20 1522     Visit Number 8    Number of Visits 16    Date for PT Re-Evaluation 12/13/20    Authorization Type UHC    PT Start Time 6962    PT Stop Time 9528    PT Time Calculation (min) 43 min    Activity Tolerance Patient tolerated treatment well    Behavior During Therapy [facility] for tasks assessed/performed             Past Medical History:  Diagnosis Date   Aneurysm ([place])    CAD (coronary artery disease)    CKD (chronic kidney disease) stage 3, GFR 30-59 ml/min ([place])    History of basal cell carcinoma excision 12/20/2017   Hyperlipidemia LDL goal <70    Hypertension    Hypothyroidism    Tobacco abuse    Vitamin D deficiency     Past Surgical History:  Procedure Laterality Date   CATARACT EXTRACTION, BILATERAL Bilateral 2020   CEREBRAL ANEURYSM REPAIR Right 2012   CEREBRAL ANEURYSM REPAIR Right 2005   CORONARY ARTERY BYPASS GRAFT  2002   Dr Cyndia Bent   EMBOLIZATION  2005   Embolization of right PICA aneurysm    There were no vitals filed for this visit.   Subjective Assessment - 12/08/20 1522     Subjective Pt presents to PT with continued reports of improving status and confidence. She has continued to be compliant with her HEP with no adverse effect. Ready to begin PT treatment at this time.    Currently in Pain? No/denies    Pain Score 0-No pain           [facility] Adult PT Treatment/Exercise:   Therapeutic Exercise:  NuStep lvl 6 x 3 min LE while taking subjective L ankle DF YTB x 10 L ankle PF with slow return to DF 3x15 red tband L calf stretch w/ towel 2x30 sec Standing calf stretch 2x30  sec Eccentric heel lowering x 15 on 2n  Neuromuscular Re-Ed: [place] ESTIM; 24opt, 59min, 10sec on/off - L ant tib with active DF in on cycle L SLS on foam 4 x15 sec (NOT TODAY) L SLS 2x30 sec Tandem on foam L back 2x30 sec Wobble board fwd/bwd 2 x 20 - bil UE support Manual by PT overflow with R LE with overstretch at PF to activate stretch reflex. Followed by L LE stretch reflex                                PT Short Term Goals - 11/29/20 1654       PT SHORT TERM GOAL #1   Title Pt will be independent with initial HEP    Time 3    Period Weeks    Status On-going    Target Date 11/08/20      PT SHORT TERM GOAL #2   Title Pt  will increase DF of Left foot 10 degrees for better foot clearance in gait    Baseline -30 DF Left eval    Time 3    Period Weeks    Status Unable to  assess    Target Date 11/08/20      PT SHORT TERM GOAL #3   Title Pt will be gait trained with safest AD. ( presently with cane) to prevent falls and assess need for DF assist ([place] soft ankle DF assist brace)    Baseline has brace but not utilizing cane today and did not bring brace.    Time 3    Period Weeks    Status On-going    Target Date 11/08/20               PT Long Term Goals - 10/18/20 1750       PT LONG TERM GOAL #1   Title Pt will be indepedent with advanced HEP    Baseline no exercise    Time 8    Period Weeks    Status New    Target Date 12/13/20      PT LONG TERM GOAL #2   Title Pt will be able to increase DF to at lease neutral to decrease of risk of fall    Baseline eval -30    Time 8    Period Weeks    Status New    Target Date 12/13/20      PT LONG TERM GOAL #3   Title Pt will be able to squat and pick up  10 lb groceries without fear of falling    Baseline Pt fearful of picking up items off of ground due to fear of falling    Period Weeks    Status New    Target Date 12/13/20      PT LONG TERM GOAL #4   Title Pt will demostrate  floor to standing transfer independently to show fall preparedness and reduce fear of  falling    Baseline Pt reluctant to leave home due to fear of falling and unable to get back up    Time 8    Period Weeks    Status New    Target Date 12/13/20      PT LONG TERM GOAL #5   Title FOTO will improve from49%    to   66%  indicating improved functional mobility.    Baseline eval 49%    Time 8    Period Weeks    Status New    Target Date 12/13/20      PT LONG TERM GOAL #6   Title Pt will incorporate regular exercise such as 30 min of walking 3-5 x a week to reduce sedentary lifestyle contributing to deconditoning and LE weakness    Baseline no knowledge, pt is smoker and sedentary    Time 8    Period Weeks    Status New    Target Date 12/13/20                   Plan - 12/08/20 1553     Clinical Impression Statement Pt was able to complete prescribed exercises with no adverse effect or change in baseline. Today's session continued to focus on improving distal L LE muscle activation, L LE stability. Incorporated more calf stretching today to work on improving DF ROM.  HEP updated for continued calf stretching, with pt continuing to benefit from skilled services. Will continue to progress as tolerated.    PT Treatment/Interventions ADLs/Self Care Home Management;Cryotherapy;Electrical Stimulation;Iontophoresis 4mg /ml Dexamethasone;Moist Heat;Balance training;Therapeutic exercise;Therapeutic activities;Functional mobility training;Stair training;Gait training;Neuromuscular re-education;Manual techniques;Patient/family education;Orthotic Fit/Training;Passive range of motion;Dry needling;Taping;Joint Manipulations    PT Next Visit Plan  [place] ESTIM to L tiba ant, gait with cane on step, check goals next visit    PT Home Exercise Plan Access Code: [email or web address]             Patient will benefit from skilled therapeutic intervention in order to improve the following deficits and  impairments:  Pain, Impaired sensation, Abnormal gait, Decreased activity tolerance, Decreased mobility, Decreased range of motion, Decreased strength, Increased edema  Visit Diagnosis: Pain in left foot  Foot drop, left  Muscle weakness (generalized)  Other abnormalities of gait and mobility     Problem List Patient Active Problem List   Diagnosis Date Noted   Elevated LFTs 09/28/2020   Acute kidney injury ([place]) 09/28/2020   Complex renal cyst 09/28/2020   Multiple falls 09/27/2020   Left foot drop 09/27/2020   History of basal cell carcinoma (BCC) 05/04/2020   Atypical nevus of right lower back 04/20/2019   Emphysema of lung ([place]) 08/25/2018   Aortic atherosclerosis ([place]) 08/25/2018   Cholelithiases 08/25/2018   Claudication of left lower extremity ([place]) 04/02/2018   Anemia in chronic kidney disease 02/01/2017   Overweight (BMI 25.0-29.9) 12/02/2014   Medication management 06/25/2014   Poor compliance 10/09/2013   Vitamin D deficiency    SMOKER 08/17/2009   Hypothyroidism 08/16/2009   Hyperlipidemia 08/16/2009   Essential hypertension 08/16/2009   CAD (coronary artery disease) 08/16/2009   History of CVA (cerebrovascular accident) 08/16/2009   Peripheral vascular disease ([place]) 08/16/2009   CKD (chronic kidney disease) stage 4, GFR 15-29 ml/min ([place]) 08/16/2009   Seizure disorder ([place]) 08/16/2009    Ward Chatters, PT 12/08/2020, 4:15 PM  [facility] [address] [place], [place], [address] Phone: [phone]   Fax:  [phone]  Name: Olivia Walsh MRN: [id number] Date of Birth: 1961/02/14

## 2020-12-09 ENCOUNTER — Ambulatory Visit (INDEPENDENT_AMBULATORY_CARE_PROVIDER_SITE_OTHER): Payer: 59 | Admitting: Neurology

## 2020-12-09 ENCOUNTER — Encounter: Payer: Self-pay | Admitting: Neurology

## 2020-12-09 VITALS — BP 109/75 | HR 74 | Ht 67.0 in | Wt 156.0 lb

## 2020-12-09 DIAGNOSIS — M21372 Foot drop, left foot: Secondary | ICD-10-CM

## 2020-12-09 NOTE — Patient Instructions (Signed)
Continue with physical therapy  NCS/EMG  Lumbar spine MRI  Return in 3 months

## 2020-12-09 NOTE — Progress Notes (Signed)
[facility]  PATIENT: Olivia Walsh DOB: 08-25-1960  REFERRING CLINICIAN: Liane Comber, NP HISTORY FROM: Patient  REASON FOR VISIT: Left foot drop    HISTORICAL  CHIEF COMPLAINT:  Chief Complaint  Patient presents with   New Patient (Initial Visit)    Pt with husband, rm 50, 10 yrs ago noticed walking and would trip she thought was just clumsiness. She states more recently this has become more frequent. The falls are causing her to actually fall and she has been unable to get up.  Referred to PT and working with them roughly 24months and they advised she needed to evaluate the cause behind the left drop foot.     HISTORY OF PRESENT ILLNESS:  This is a 60 year old female with past medical history of multiple brain aneurysms status post rupture and coiling, CKD, CAD, hypertension, hyperlipidemia, hypothyroidism and vitamin D deficiency who is presenting with complaint of left foot drop.  Patient reported she was diagnosed with left foot drop 10 years ago.  She did not have any physical therapy or additional evaluation to find out the etiology of the left foot drop.  She was able to work and walk independently.  She reported in the past 2 years due to the pandemic and working from home, she had not been ambulatory as before.  During this time, she noted the left foot is weaker and she had a couple falls and unable to get up.  Due to this she started physical therapy.  She report good progress with physical therapy and stated that her left leg is gaining some strength but her physical therapist wanted a neurological evaluation to look for etiology of the left foot drop.  She denies any previous injuries,  stated that she did cross her legs but no trauma.  Denies any numbness or pain in the lower extremities.    OTHER MEDICAL CONDITIONS: Multiple cerebral aneurysm, CKD, CAD, hypertension, hyperlipidemia, hypothyroidism, and vitamin D deficiency.   REVIEW OF  SYSTEMS: Full 14 system review of systems performed and negative with exception of: As noted in the HPI  ALLERGIES: Allergies  Allergen Reactions   Ace Inhibitors Cough   Penicillins    Prednisone     Dysphoria    HOME MEDICATIONS: Outpatient Medications Prior to Visit  Medication Sig Dispense Refill   clopidogrel (PLAVIX) 75 MG tablet Take  1 tablet  Daily  to Prevent  Blood Clots 90 tablet 3   ezetimibe (ZETIA) 10 MG tablet TAKE 1 TABLET(10 MG) BY MOUTH DAILY 90 tablet 3   labetalol (NORMODYNE) 300 MG tablet TAKE 1 TABLET BY MOUTH TWO  TIMES DAILY 180 tablet 0   levETIRAcetam (KEPPRA) 500 MG tablet TAKE 1 TABLET BY MOUTH TWICE DAILY TO PREVENT SEIZURES 180 tablet 3   levothyroxine (SYNTHROID) 75 MCG tablet Take 1/2 tablet daily except skip Sunday. Take an empty stomach with only water for 30 minutes & no Antacid meds, Calcium or Magnesium for 4 hours & avoid Biotin 90 tablet 3   losartan (COZAAR) 50 MG tablet Take 50 mg by mouth daily.     rosuvastatin (CRESTOR) 40 MG tablet TAKE 1 TABLET BY MOUTH  DAILY FOR CHOLESTEROL (Patient taking differently: TAKE 1 TABLET BY MOUTH  DAILY FOR CHOLESTEROL) 90 tablet 3   No facility-administered medications prior to visit.    PAST MEDICAL HISTORY: Past Medical History:  Diagnosis Date   Aneurysm ([place])    CAD (coronary artery disease)    CKD (chronic kidney  disease) stage 3, GFR 30-59 ml/min ([facility])    History of basal cell carcinoma excision 12/20/2017   Hyperlipidemia LDL goal <70    Hypertension    Hypothyroidism    Tobacco abuse    Vitamin D deficiency     PAST SURGICAL HISTORY: Past Surgical History:  Procedure Laterality Date   CATARACT EXTRACTION, BILATERAL Bilateral 2020   CEREBRAL ANEURYSM REPAIR Right 2012   CEREBRAL ANEURYSM REPAIR Right 2005   CORONARY ARTERY BYPASS GRAFT  2002   Dr Cyndia Bent   EMBOLIZATION  2005   Embolization of right PICA aneurysm    FAMILY HISTORY: Family History  Problem Relation Age of Onset    Brain cancer Father        brain   Leukemia Sister 69   Heart disease Mother    Heart attack Mother 59   Heart disease Brother    Heart attack Brother 1   Heart attack Maternal Grandfather    Heart disease Brother 8   Multiple sclerosis Sister     SOCIAL HISTORY: Social History   Socioeconomic History   Marital status: Married    Spouse name: Not on file   Number of children: 0   Years of education: Not on file   Highest education level: Not on file  Occupational History   Not on file  Tobacco Use   Smoking status: Every Day    Packs/day: 1.00    Years: 46.00    Pack years: 46.00    Types: Cigarettes    Start date: 1975   Smokeless tobacco: Never   Tobacco comments:    1/2-2 ppd   Vaping Use   Vaping Use: Never used  Substance and Sexual Activity   Alcohol use: No    Comment: rarely, 1-2 per year   Drug use: No   Sexual activity: Yes    Partners: Male    Birth control/protection: Post-menopausal  Other Topics Concern   Not on file  Social History Narrative   Not on file   Social Determinants of Health   Financial Resource Strain: Not on file  Food Insecurity: Not on file  Transportation Needs: Not on file  Physical Activity: Not on file  Stress: Not on file  Social Connections: Not on file  Intimate Partner Violence: Not on file     PHYSICAL EXAM  GENERAL EXAM/CONSTITUTIONAL: Vitals:  Vitals:   12/09/20 1058  BP: 109/75  Pulse: 74  Weight: 156 lb (70.8 kg)  Height: 5\' 7"  (1.702 m)   Body mass index is 24.43 kg/m. Wt Readings from Last 3 Encounters:  12/09/20 156 lb (70.8 kg)  12/07/20 159 lb 6.4 oz (72.3 kg)  11/02/20 167 lb (75.8 kg)   Patient is in no distress; well developed, nourished and groomed; neck is supple  EYES: Pupils round and reactive to light, Visual fields full to confrontation, Extraocular movements intacts,   MUSCULOSKELETAL: Gait, strength, tone, movements noted in Neurologic exam below  NEUROLOGIC: MENTAL  STATUS:  No flowsheet data found. awake, alert, oriented to person, place and time recent and remote memory intact normal attention and concentration language fluent, comprehension intact, naming intact fund of knowledge appropriate  CRANIAL NERVE:  2nd, 3rd, 4th, 6th - pupils equal and reactive to light, visual fields full to confrontation, extraocular muscles intact, no nystagmus 5th - facial sensation symmetric 7th - facial strength symmetric 8th - hearing intact 9th - palate elevates symmetrically, uvula midline 11th - shoulder shrug symmetric 12th - tongue protrusion  midline  MOTOR:  normal bulk and tone, full strength in the BUE, BLE except for left foot dorsiflexion 4/5 and 3/5 left foot eversion  SENSORY:  normal and symmetric to light touch, pinprick, temperature, vibration  COORDINATION:  finger-nose-finger, fine finger movements normal  REFLEXES:  deep tendon reflexes present and symmetric  GAIT/STATION:  normal     DIAGNOSTIC DATA (LABS, IMAGING, TESTING) - I reviewed patient records, labs, notes, testing and imaging myself where available.  Lab Results  Component Value Date   WBC 7.1 09/27/2020   HGB 10.3 (L) 09/27/2020   HCT 30.6 (L) 09/27/2020   MCV 98.1 09/27/2020   PLT 154 09/27/2020      Component Value Date/Time   NA 134 (L) 09/29/2020 1440   K 4.3 09/29/2020 1440   CL 102 09/29/2020 1440   CO2 20 09/29/2020 1440   GLUCOSE 88 09/29/2020 1440   BUN 60 (H) 09/29/2020 1440   CREATININE 3.74 (H) 09/29/2020 1440   CALCIUM 8.7 09/29/2020 1440   PROT 6.4 09/29/2020 1440   ALBUMIN 3.9 09/26/2016 1505   AST 293 (H) 09/29/2020 1440   ALT 253 (H) 09/29/2020 1440   ALKPHOS 45 09/26/2016 1505   BILITOT 0.3 09/29/2020 1440   GFRNONAA 23 (L) 05/04/2020 1447   GFRAA 26 (L) 05/04/2020 1447   Lab Results  Component Value Date   CHOL 97 09/27/2020   HDL 24 (L) 09/27/2020   LDLCALC 47 09/27/2020   TRIG 191 (H) 09/27/2020   CHOLHDL 4.0 09/27/2020    Lab Results  Component Value Date   HGBA1C 5.1 05/04/2020   Lab Results  Component Value Date   VITAMINB12 346 04/20/2019   Lab Results  Component Value Date   TSH 0.71 09/27/2020    ASSESSMENT AND PLAN  60 y.o. year old female with multiple cerebral aneurysms, CKD, CAD, hypertension, hyperlipidemia, hypothyroidism, and vitamin D deficiency who is presenting with complaint of left foot drop.  Patient stated that she was diagnosed 10 years ago but did not have any additional work-up or physical therapy regarding left foot drop.  In the past 2 years due to work from home and not being able to ambulate as before she noted that the left foot is getting weaker to the point that she had a couple falls and inability to get up.  She has started physical therapy with good improvement of her strength and presented today for further work-up.  I will obtain a nerve conduction study/EMG and lumbar spine MRI for further work-up.  I will see the patient in 3 months for follow-up.  Advised her to continue with physical therapy.   1. Left foot drop     PLAN: Continue with physical therapy  NCS/EMG  Lumbar spine MRI  Return in 3 months   Orders Placed This Encounter  Procedures   MR LUMBAR SPINE WO CONTRAST   NCV with EMG(electromyography)    No orders of the defined types were placed in this encounter.   Return in about 3 months (around 03/11/2021).    Alric Ran, MD 12/09/2020, 11:30 AM  [facility] [address], [place], [place] [address] [phone]

## 2020-12-13 ENCOUNTER — Ambulatory Visit: Payer: 59 | Admitting: Physical Therapy

## 2020-12-13 ENCOUNTER — Ambulatory Visit (INDEPENDENT_AMBULATORY_CARE_PROVIDER_SITE_OTHER): Payer: 59

## 2020-12-13 DIAGNOSIS — M21372 Foot drop, left foot: Secondary | ICD-10-CM

## 2020-12-13 NOTE — Progress Notes (Signed)
============================================================ ============================================================  -    Surgical Path - Clear - No residual skin cancer cells - Great   !  ============================================================ ============================================================

## 2020-12-15 ENCOUNTER — Ambulatory Visit: Payer: 59

## 2020-12-15 ENCOUNTER — Other Ambulatory Visit: Payer: Self-pay

## 2020-12-15 DIAGNOSIS — M79672 Pain in left foot: Secondary | ICD-10-CM | POA: Diagnosis not present

## 2020-12-15 DIAGNOSIS — M21372 Foot drop, left foot: Secondary | ICD-10-CM

## 2020-12-15 DIAGNOSIS — M6281 Muscle weakness (generalized): Secondary | ICD-10-CM

## 2020-12-15 DIAGNOSIS — R2689 Other abnormalities of gait and mobility: Secondary | ICD-10-CM

## 2020-12-16 NOTE — Therapy (Signed)
Olivia Walsh, [place], [address] Phone: [phone]   Fax:  [phone]  Physical Therapy Treatment/Discharge  Patient Details  Name: Olivia Walsh MRN: [id number] Date of Birth: [birth year] Referring Provider (PT): Liane Comber NP   Encounter Date: 12/15/2020   PT End of Session - 12/15/20 1536     Visit Number 9    Number of Visits 16    Date for PT Re-Evaluation 12/13/20    Authorization Type U[employer]    PT Start Time 1532    PT Stop Time 1608    PT Time Calculation (min) 36 min    Activity Tolerance Patient tolerated treatment well    Behavior During Therapy [facility] for tasks assessed/performed             Past Medical History:  Diagnosis Date   Aneurysm ([place])    CAD (coronary artery disease)    CKD (chronic kidney disease) stage 3, GFR 30-59 ml/min ([place])    History of basal cell carcinoma excision 12/20/2017   Hyperlipidemia LDL goal <70    Hypertension    Hypothyroidism    Tobacco abuse    Vitamin D deficiency     Past Surgical History:  Procedure Laterality Date   CATARACT EXTRACTION, BILATERAL Bilateral 2020   CEREBRAL ANEURYSM REPAIR Right 2012   CEREBRAL ANEURYSM REPAIR Right 2005   CORONARY ARTERY BYPASS GRAFT  2002   Dr Cyndia Bent   EMBOLIZATION  2005   Embolization of right PICA aneurysm    There were no vitals filed for this visit.   Subjective Assessment - 12/16/20 1013     Subjective Pt presents to PT after initial visit with neurology. She had an MRI and upcoming nerve conduction and EMG studies. She has been compliant with her HEP with no adverse effect. Pt is ready to begin PT discharge assessment at this time.                [facility] PT Assessment - 12/16/20 0001       Observation/Other Assessments   Focus on Therapeutic Outcomes (FOTO)  61% function      AROM   Left Ankle Dorsiflexion --   -10 degrees           [facility] Adult PT Treatment/Exercise:    Therapeutic Exercise:  L ankle DF YTB x 10 L ankle PF with slow return to DF 3x15 red tband L calf stretch w/ towel x30 sec Standing calf stretch x30 sec   Neuromuscular Re-Ed: [place] ESTIM; 24opt, 41mn, 10sec on/off - L ant tib with active DF in on cycle L SLS x 20 sec Tandem L back 2x30 sec                        PT Education - 12/15/20 1609     Education Details HEP and discharge plan    Person(s) Educated Patient    Methods Explanation;Demonstration;Handout    Comprehension Verbalized understanding;Returned demonstration              PT Short Term Goals - 12/15/20 1539       PT SHORT TERM GOAL #1   Title Pt will be independent with initial HEP    Time 3    Period Weeks    Status Achieved    Target Date 11/08/20      PT SHORT TERM GOAL #2   Title Pt  will increase DF of Left foot 10  degrees for better foot clearance in gait    Baseline -30 DF Left eval    Time 3    Period Weeks    Status Achieved    Target Date 11/08/20      PT SHORT TERM GOAL #3   Title Pt will be gait trained with safest AD. ( presently with cane) to prevent falls and assess need for DF assist ([place] soft ankle DF assist brace)    Baseline has brace but not utilizing cane today and did not bring brace.    Time 3    Period Weeks    Status Achieved    Target Date 11/08/20               PT Long Term Goals - 12/16/20 1017       PT LONG TERM GOAL #1   Title Pt will be indepedent with advanced HEP    Baseline no exercise    Time 8    Period Weeks    Status Achieved      PT LONG TERM GOAL #2   Title Pt will be able to increase DF to at lease neutral to decrease of risk of fall    Baseline eval -30; 10/20: -10 deg    Time 8    Period Weeks    Status Partially Met      PT LONG TERM GOAL #3   Title Pt will be able to squat and pick up  10 lb groceries without fear of falling    Baseline Pt fearful of picking up items off of ground due to fear of falling     Period Weeks    Status Achieved      PT LONG TERM GOAL #4   Title Pt will demostrate floor to standing transfer independently to show fall preparedness and reduce fear of  falling    Baseline Pt reluctant to leave home due to fear of falling and unable to get back up    Time 8    Period Weeks    Status Achieved      PT LONG TERM GOAL #5   Title FOTO will improve from49%    to   66%  indicating improved functional mobility.    Baseline eval 49%; 10/20 - 61%    Time 8    Period Weeks    Status Partially Met      PT LONG TERM GOAL #6   Title Pt will incorporate regular exercise such as 30 min of walking 3-5 x a week to reduce sedentary lifestyle contributing to deconditoning and LE weakness    Baseline no knowledge, pt is smoker and sedentary    Time 8    Period Weeks    Status Partially Met                   Plan - 12/16/20 1015     Clinical Impression Statement Pt responded well to PT and demonstrated knowledge of HEP with no adverse effect. Over the course of PT she has improved her stability and L ankle strength.She made progress towards all LTGs and should continue to improve with HEP compliance. PT is discharging pt for now as she will have upcoming tests to determine severity of L foot drop in relation to nerve firing. She is pleased with current functional level and is in agreement with current plan.    PT Treatment/Interventions ADLs/Self Care Home Management;Cryotherapy;Electrical Stimulation;Iontophoresis 54m/ml Dexamethasone;Moist Heat;Balance training;Therapeutic exercise;Therapeutic activities;Functional mobility  training;Stair training;Gait training;Neuromuscular re-education;Manual techniques;Patient/family education;Orthotic Fit/Training;Passive range of motion;Dry needling;Taping;Joint Manipulations    PT Home Exercise Plan Access Code: [email or web address]    Consulted and Agree with Plan of Care Patient             Patient will benefit from skilled therapeutic  intervention in order to improve the following deficits and impairments:  Pain, Impaired sensation, Abnormal gait, Decreased activity tolerance, Decreased mobility, Decreased range of motion, Decreased strength, Increased edema  Visit Diagnosis: Pain in left foot  Foot drop, left  Muscle weakness (generalized)  Other abnormalities of gait and mobility     Problem List Patient Active Problem List   Diagnosis Date Noted   Elevated LFTs 09/28/2020   Acute kidney injury ([place]) 09/28/2020   Complex renal cyst 09/28/2020   Multiple falls 09/27/2020   Left foot drop 09/27/2020   History of basal cell carcinoma ([facility]) 05/04/2020   Atypical nevus of right lower back 04/20/2019   Emphysema of lung ([place]) 08/25/2018   Aortic atherosclerosis ([place]) 08/25/2018   Cholelithiases 08/25/2018   Claudication of left lower extremity ([place]) 04/02/2018   Anemia in chronic kidney disease 02/01/2017   Overweight (BMI 25.0-29.9) 12/02/2014   Medication management 06/25/2014   Poor compliance 10/09/2013   Vitamin D deficiency    SMOKER 08/17/2009   Hypothyroidism 08/16/2009   Hyperlipidemia 08/16/2009   Essential hypertension 08/16/2009   CAD (coronary artery disease) 08/16/2009   History of CVA (cerebrovascular accident) 08/16/2009   Peripheral vascular disease ([place]) 08/16/2009   CKD (chronic kidney disease) stage 4, GFR 15-29 ml/min ([place]) 08/16/2009   Seizure disorder ([place]) 08/16/2009    Ward Chatters, PT 12/16/2020, 10:18 AM  [facility] [address] [place], [place], [address] Phone: [phone]   Fax:  [phone]  Name: Olivia Walsh MRN: [id number] Date of Birth: [birth year]   PHYSICAL THERAPY DISCHARGE SUMMARY  Visits from Start of Care: 9  Current functional level related to goals / functional outcomes: See goals and objective   Remaining deficits: See goals and objective   Education / Equipment: HEP   Patient  agrees to discharge. Patient goals were  mostly met . Patient is being discharged due to being pleased with the current functional level.

## 2020-12-20 ENCOUNTER — Ambulatory Visit: Payer: 59 | Admitting: Internal Medicine

## 2020-12-20 ENCOUNTER — Other Ambulatory Visit: Payer: Self-pay

## 2020-12-20 VITALS — BP 110/78 | HR 87 | Temp 97.9°F | Resp 17 | Ht 67.0 in | Wt 159.8 lb

## 2020-12-20 DIAGNOSIS — C44519 Basal cell carcinoma of skin of other part of trunk: Secondary | ICD-10-CM

## 2020-12-20 NOTE — Progress Notes (Signed)
       Patient returns s/p re-excision of a small skin cancer with negative margins .      Wound well healed w/o signs of infection & sutures removed.

## 2021-01-29 NOTE — Progress Notes (Addendum)
Future Appointments  Date Time Provider Department  01/30/2021  9:30 AM [name], MD [facility]  02/01/2021  2:00 PM Belinda Block, CMA [facility]  02/01/2021  2:45 PM Sater, Nanine Means, MD [facility]  03/15/2021  2:45 PM Alric Ran, MD [facility]  05/04/2021  2:00 PM Liane Comber, NP M[facility]-    History of Present Illness:       This very nice 60 y.o. MWF presents for 3 month follow up with HTN, HLD, ASHD, ASPVD, ASCVD, Pre-Diabetes and Vitamin D Deficiency. Chest CT scan in June 2020 showed Aortic Atherosclerosis. Patient is followed by Neurology - Dr Alric Ran for Left foot drop. Patient has hx/o Seizure Disorder (last was in 2008).       Recently in August labs showed elevated LFT's and worsening CKD from Stage 4 -->> Stage 5 and liver w/u  (Serologies  & U/S) was negative and patient was referred to [facility] GI, but no records have been received.        Because of low BP today, stat labs showed low K = 2.6 with interesting drop in BUN/Creat & GFR  & despite not being on a diuretic to explain her low Potassium. LFT's are still up on today's stat labs. Patient was contacted & strongly advised to go to ER to have Potassium rechecked and most likely receive IVF & IV Potassium.        Patient is treated for HTN ( age 43 in 84 ) & BP has been controlled at home. Today's BP: (!) 82/54.  In 2002 , at age 31, she underwent emergent CABG.   In 2005, she was hospitalized at [facility] with a [place] from Cerebral Aneurysms & had stents placed in 2008 .  Then in 2012, she had coiling of 2 more aneurysms. Patient has also been followed by vascular surgery for ASPVD.   She also has CKD4 attributed to her HT & Vascular Disease . Patient has had no complaints of any cardiac type chest pain, palpitations, dyspnea Vertell Limber /PND, dizziness, claudication or dependent edema.       Hyperlipidemia is controlled with diet & meds. Patient denies myalgias or other med SE's. Last Lipids were at goal  except elevated Trig's :  Lab Results  Component Value Date   CHOL 97 09/27/2020   HDL 24 (L) 09/27/2020   LDLCALC 47 09/27/2020   TRIG 191 (H) 09/27/2020   CHOLHDL 4.0 09/27/2020     Also, the patient has history of PreDiabetes (A1c 6.1% /2015) and has had no symptoms of reactive hypoglycemia, diabetic polys, paresthesias or visual blurring.  Last A1c was normal & at goal:  Lab Results  Component Value Date   HGBA1C 5.1 05/04/2020           In 2000, she was dx'd Hypothyroid and initiated on thyroid replacement.                                                       Further, the patient also has history of Vitamin D Deficiency  ("39" /2008 and "19" /2010) and supplements vitamin D without any suspected side-effects. Last vitamin D was at goal (borderline elevated) :  Lab Results  Component Value Date   VD25OH 106 (H) 05/04/2020     Current Outpatient Medications on File Prior to  Visit  Medication Sig   clopidogrel (PLAVIX) 75 MG tablet Take  1 tablet  Daily     ezetimibe (ZETIA) 10 MG tablet TAKE 1 TABLET DAILY   labetalol (NORMODYNE) 300 MG tablet TAKE 1 TABLET TWO  TIMES DAILY   levETIRAcetam (KEPPRA) 500 MG tablet TAKE 1 TABLET TWICE DAILY    levothyroxine (SYNTHROID) 75 MCG tablet Take 1/2 tablet daily except skip Sunday.    losartan (COZAAR) 50 MG tablet Take 1 tablet  daily.   rosuvastatin (CRESTOR) 40 MG tablet TAKE 1 TABLET  DAILY      Allergies  Allergen Reactions   Ace Inhibitors Cough   Penicillins    Prednisone     Dysphoria    PMHx:   Past Medical History:  Diagnosis Date   Aneurysm ([place])    CAD (coronary artery disease)    CKD (chronic kidney disease) stage 3, GFR 30-59 ml/min ([facility])    History of basal cell carcinoma excision 12/20/2017   Hyperlipidemia LDL goal <70    Hypertension    Hypothyroidism    Tobacco abuse    Vitamin D deficiency      Immunization History  Administered Date(s) Administered   Influenza Inj Mdck Quad  01/31/2017,  12/23/2017, 01/05/2020   Influenza Split 12/02/2014   Influenza,inj,quad   12/20/2015   PFIZER SARS-COV-2 Vacc 07/13/2019, 08/07/2019, 04/06/2020   PPD Test 10/09/2013, 12/02/2014   Td 10/24/2006, 01/31/2017     Past Surgical History:  Procedure Laterality Date   CATARACT EXTRACTION, BILATERAL Bilateral 2020   CEREBRAL ANEURYSM REPAIR Right 2012   CEREBRAL ANEURYSM REPAIR Right 2005   CORONARY ARTERY BYPASS GRAFT  2002   Dr Cyndia Bent   EMBOLIZATION  2005   Embolization of right PICA aneurysm    FHx:    Reviewed / unchanged  SHx:    Reviewed / unchanged   Systems Review:  Constitutional: Denies fever, chills, wt changes, headaches, insomnia, fatigue, night sweats, change in appetite. Eyes: Denies redness, blurred vision, diplopia, discharge, itchy, watery eyes.  ENT: Denies discharge, congestion, post nasal drip, epistaxis, sore throat, earache, hearing loss, dental pain, tinnitus, vertigo, sinus pain, snoring.  CV: Denies chest pain, palpitations, irregular heartbeat, syncope, dyspnea, diaphoresis, orthopnea, PND, claudication or edema. Respiratory: denies cough, dyspnea, DOE, pleurisy, hoarseness, laryngitis, wheezing.  Gastrointestinal: Denies dysphagia, odynophagia, heartburn, reflux, water brash, abdominal pain or cramps, nausea, vomiting, bloating, diarrhea, constipation, hematemesis, melena, hematochezia  or hemorrhoids. Genitourinary: Denies dysuria, frequency, urgency, nocturia, hesitancy, discharge, hematuria or flank pain. Musculoskeletal: Denies arthralgias, myalgias, stiffness, jt. swelling, pain, limping or strain/sprain.  Skin: Denies pruritus, rash, hives, warts, acne, eczema or change in skin lesion(s). Neuro: No weakness, tremor, incoordination, spasms, paresthesia or pain. Psychiatric: Denies confusion, memory loss or sensory loss. Endo: Denies change in weight, skin or hair change.  Heme/Lymph: No excessive bleeding, bruising or enlarged lymph nodes.  Physical  Exam  BP (!) 82/54   Pulse 70   Temp 97.9 F (36.6 C)   Resp 17   Ht 5\' 7"  (1.702 m)   Wt 162 lb 6.4 oz (73.7 kg)   SpO2 97%   BMI 25.44 kg/m   Appears  well nourished, well groomed  and in no distress.  Eyes: PERRLA, EOMs, conjunctiva no swelling or erythema. Sinuses: No frontal/maxillary tenderness ENT/Mouth: EAC's clear, TM's nl w/o erythema, bulging. Nares clear w/o erythema, swelling, exudates. Oropharynx clear without erythema or exudates. Oral hygiene is good. Tongue normal, non obstructing. Hearing intact.  Neck: Supple. Thyroid not  palpable. Car 2+/2+ without bruits, nodes or JVD. Chest: Respirations nl with BS clear & equal w/o rales, rhonchi, wheezing or stridor.  Cor: Heart sounds normal w/ regular rate and rhythm without sig. murmurs, gallops, clicks or rubs. Pedal pulses 1+/1+ and equal  without edema.  Abdomen: Soft & bowel sounds normal. Non-tender w/o guarding, rebound, hernias, masses or organomegaly.  Lymphatics: Unremarkable.  Musculoskeletal: Full ROM all peripheral extremities, joint stability, 5/5 strength and normal gait.  Skin: Warm, dry without exposed rashes, lesions or ecchymosis apparent.  Neuro: Cranial nerves intact, reflexes equal bilaterally. Sensory-motor testing grossly intact. Tendon reflexes grossly intact.  Pysch: Alert & oriented x 3.  Insight and judgement nl & appropriate. No ideations.  Assessment and Plan:  1. Essential hypertension  - Continue medication, monitor blood pressure at home.  - Continue DASH diet.  Reminder to go to the ER if any CP,  SOB, nausea, dizziness, severe HA, changes vision/speech.   - CBC with Differential/Platelet - COMPLETE METABOLIC PANEL WITH GFR - Magnesium - TSH   2. Hyperlipidemia, mixed  - Continue diet/meds, exercise,& lifestyle modifications.  - Continue monitor periodic cholesterol/liver & renal functions    - Lipid panel - TSH   3. Abnormal glucose  - Continue diet, exercise  -  Lifestyle modifications.  - Monitor appropriate labs   - Hemoglobin A1c - Insulin, random   4. Vitamin D deficiency  - Continue supplementation  - VITAMIN D 25 Hydroxy   5. Coronary artery disease involving native coronary  artery of native heart without angina pectoris  - Lipid panel   6. Aortic Atherosclerosis ([place]) by Chest CT scan in June 2020   - Lipid panel   7. Peripheral vascular disease ([place])  - Lipid panel   8. CKD (chronic kidney disease) stage 4, GFR 15-29 ml/min ([facility])  - COMPLETE METABOLIC PANEL WITH GFR   9. Hypothyroidism  - TSH   10. History of CVA (cerebrovascular accident)  - Lipid panel   11. Medication management  - CBC with Differential/Platelet - COMPLETE METABOLIC PANEL WITH GFR - Magnesium - Lipid panel - TSH - Hemoglobin A1c - Insulin, random - VITAMIN D 25 Hydroxy   12. Seizure Disorder   - Keppra level    *  After Stat labs received, Patient was contacted & referred to ER         Discussed  regular exercise, BP monitoring, weight control to achieve/maintain BMI less than 25 and discussed med and SE's. Recommended labs to assess and monitor clinical status with further disposition pending results of labs.  I discussed the assessment and treatment plan with the patient. The patient was provided an opportunity to ask questions and all were answered. The patient agreed with the plan and demonstrated an understanding of the instructions.  I provided over 30 minutes of exam, counseling, chart review and  complex critical decision making.  Kirtland Bouchard, MD

## 2021-01-29 NOTE — Patient Instructions (Signed)

## 2021-01-30 ENCOUNTER — Encounter (HOSPITAL_BASED_OUTPATIENT_CLINIC_OR_DEPARTMENT_OTHER): Payer: Self-pay | Admitting: Emergency Medicine

## 2021-01-30 ENCOUNTER — Encounter: Payer: Self-pay | Admitting: Internal Medicine

## 2021-01-30 ENCOUNTER — Ambulatory Visit (INDEPENDENT_AMBULATORY_CARE_PROVIDER_SITE_OTHER): Payer: 59 | Admitting: Internal Medicine

## 2021-01-30 ENCOUNTER — Telehealth: Payer: Self-pay | Admitting: Internal Medicine

## 2021-01-30 ENCOUNTER — Inpatient Hospital Stay (HOSPITAL_BASED_OUTPATIENT_CLINIC_OR_DEPARTMENT_OTHER)
Admission: EM | Admit: 2021-01-30 | Discharge: 2021-02-01 | DRG: 280 | Disposition: A | Discharge status: Home / Self Care | Payer: 59 | Attending: Internal Medicine | Admitting: Internal Medicine

## 2021-01-30 ENCOUNTER — Other Ambulatory Visit: Payer: Self-pay

## 2021-01-30 VITALS — BP 82/54 | HR 70 | Temp 97.9°F | Resp 17 | Ht 67.0 in | Wt 162.4 lb

## 2021-01-30 DIAGNOSIS — Z79899 Other long term (current) drug therapy: Secondary | ICD-10-CM

## 2021-01-30 DIAGNOSIS — I25110 Atherosclerotic heart disease of native coronary artery with unstable angina pectoris: Secondary | ICD-10-CM | POA: Diagnosis present

## 2021-01-30 DIAGNOSIS — N179 Acute kidney failure, unspecified: Secondary | ICD-10-CM | POA: Diagnosis present

## 2021-01-30 DIAGNOSIS — Z87898 Personal history of other specified conditions: Secondary | ICD-10-CM | POA: Diagnosis not present

## 2021-01-30 DIAGNOSIS — E785 Hyperlipidemia, unspecified: Secondary | ICD-10-CM | POA: Diagnosis present

## 2021-01-30 DIAGNOSIS — I2510 Atherosclerotic heart disease of native coronary artery without angina pectoris: Secondary | ICD-10-CM | POA: Diagnosis not present

## 2021-01-30 DIAGNOSIS — I421 Obstructive hypertrophic cardiomyopathy: Secondary | ICD-10-CM

## 2021-01-30 DIAGNOSIS — F17210 Nicotine dependence, cigarettes, uncomplicated: Secondary | ICD-10-CM | POA: Diagnosis present

## 2021-01-30 DIAGNOSIS — I739 Peripheral vascular disease, unspecified: Secondary | ICD-10-CM

## 2021-01-30 DIAGNOSIS — E782 Mixed hyperlipidemia: Secondary | ICD-10-CM | POA: Diagnosis present

## 2021-01-30 DIAGNOSIS — I422 Other hypertrophic cardiomyopathy: Secondary | ICD-10-CM | POA: Diagnosis present

## 2021-01-30 DIAGNOSIS — Z8249 Family history of ischemic heart disease and other diseases of the circulatory system: Secondary | ICD-10-CM

## 2021-01-30 DIAGNOSIS — I214 Non-ST elevation (NSTEMI) myocardial infarction: Secondary | ICD-10-CM | POA: Diagnosis present

## 2021-01-30 DIAGNOSIS — I10 Essential (primary) hypertension: Secondary | ICD-10-CM | POA: Diagnosis not present

## 2021-01-30 DIAGNOSIS — I129 Hypertensive chronic kidney disease with stage 1 through stage 4 chronic kidney disease, or unspecified chronic kidney disease: Secondary | ICD-10-CM | POA: Diagnosis present

## 2021-01-30 DIAGNOSIS — E871 Hypo-osmolality and hyponatremia: Secondary | ICD-10-CM | POA: Diagnosis present

## 2021-01-30 DIAGNOSIS — E876 Hypokalemia: Secondary | ICD-10-CM | POA: Diagnosis present

## 2021-01-30 DIAGNOSIS — R011 Cardiac murmur, unspecified: Secondary | ICD-10-CM | POA: Diagnosis not present

## 2021-01-30 DIAGNOSIS — Z888 Allergy status to other drugs, medicaments and biological substances status: Secondary | ICD-10-CM

## 2021-01-30 DIAGNOSIS — I700 Atherosclerosis of aorta: Secondary | ICD-10-CM

## 2021-01-30 DIAGNOSIS — R571 Hypovolemic shock: Secondary | ICD-10-CM | POA: Diagnosis present

## 2021-01-30 DIAGNOSIS — Z7902 Long term (current) use of antithrombotics/antiplatelets: Secondary | ICD-10-CM

## 2021-01-30 DIAGNOSIS — R7401 Elevation of levels of liver transaminase levels: Secondary | ICD-10-CM | POA: Diagnosis present

## 2021-01-30 DIAGNOSIS — Z20822 Contact with and (suspected) exposure to covid-19: Secondary | ICD-10-CM | POA: Diagnosis present

## 2021-01-30 DIAGNOSIS — E559 Vitamin D deficiency, unspecified: Secondary | ICD-10-CM | POA: Diagnosis not present

## 2021-01-30 DIAGNOSIS — R946 Abnormal results of thyroid function studies: Secondary | ICD-10-CM | POA: Diagnosis present

## 2021-01-30 DIAGNOSIS — E039 Hypothyroidism, unspecified: Secondary | ICD-10-CM

## 2021-01-30 DIAGNOSIS — N184 Chronic kidney disease, stage 4 (severe): Secondary | ICD-10-CM | POA: Diagnosis present

## 2021-01-30 DIAGNOSIS — I959 Hypotension, unspecified: Secondary | ICD-10-CM

## 2021-01-30 DIAGNOSIS — Z951 Presence of aortocoronary bypass graft: Secondary | ICD-10-CM

## 2021-01-30 DIAGNOSIS — I9589 Other hypotension: Secondary | ICD-10-CM | POA: Diagnosis not present

## 2021-01-30 DIAGNOSIS — Z79890 Hormone replacement therapy (postmenopausal): Secondary | ICD-10-CM

## 2021-01-30 DIAGNOSIS — Z8673 Personal history of transient ischemic attack (TIA), and cerebral infarction without residual deficits: Secondary | ICD-10-CM

## 2021-01-30 DIAGNOSIS — Z880 Allergy status to penicillin: Secondary | ICD-10-CM

## 2021-01-30 DIAGNOSIS — Z85828 Personal history of other malignant neoplasm of skin: Secondary | ICD-10-CM | POA: Diagnosis not present

## 2021-01-30 DIAGNOSIS — G40909 Epilepsy, unspecified, not intractable, without status epilepticus: Secondary | ICD-10-CM

## 2021-01-30 DIAGNOSIS — R7309 Other abnormal glucose: Secondary | ICD-10-CM

## 2021-01-30 LAB — BASIC METABOLIC PANEL
Anion gap: 8 (ref 5–15)
BUN: 28 mg/dL — ABNORMAL HIGH (ref 8–23)
CO2: 32 mmol/L (ref 22–32)
Calcium: 7.7 mg/dL — ABNORMAL LOW (ref 8.9–10.3)
Chloride: 90 mmol/L — ABNORMAL LOW (ref 98–111)
Creatinine, Ser: 2.43 mg/dL — ABNORMAL HIGH (ref 0.44–1.00)
GFR, Estimated: 22 mL/min — ABNORMAL LOW (ref >60)
Glucose, Bld: 109 mg/dL — ABNORMAL HIGH (ref 70–99)
Potassium: 2.7 mmol/L — CL (ref 3.5–5.1)
Sodium: 130 mmol/L — ABNORMAL LOW (ref 135–145)

## 2021-01-30 LAB — CBC WITH DIFFERENTIAL/PLATELET
Abs Immature Granulocytes: 0.03 10*3/uL (ref 0.00–0.07)
Basophils Absolute: 0.1 10*3/uL (ref 0.0–0.1)
Basophils Relative: 1 %
Eosinophils Absolute: 0.2 10*3/uL (ref 0.0–0.5)
Eosinophils Relative: 2 %
HCT: 35.2 % — ABNORMAL LOW (ref 36.0–46.0)
Hemoglobin: 11.8 g/dL — ABNORMAL LOW (ref 12.0–15.0)
Immature Granulocytes: 0 %
Lymphocytes Relative: 21 %
Lymphs Abs: 1.6 10*3/uL (ref 0.7–4.0)
MCH: 31.1 pg (ref 26.0–34.0)
MCHC: 33.5 g/dL (ref 30.0–36.0)
MCV: 92.6 fL (ref 80.0–100.0)
Monocytes Absolute: 0.7 10*3/uL (ref 0.1–1.0)
Monocytes Relative: 9 %
Neutro Abs: 5.1 10*3/uL (ref 1.7–7.7)
Neutrophils Relative %: 67 %
Platelets: 172 10*3/uL (ref 150–400)
RBC: 3.8 MIL/uL — ABNORMAL LOW (ref 3.87–5.11)
RDW: 13 % (ref 11.5–15.5)
WBC: 7.6 10*3/uL (ref 4.0–10.5)
nRBC: 0 % (ref 0.0–0.2)

## 2021-01-30 LAB — MRSA NEXT GEN BY PCR, NASAL: MRSA by PCR Next Gen: NOT DETECTED

## 2021-01-30 LAB — COMPREHENSIVE METABOLIC PANEL
ALT: 277 U/L — ABNORMAL HIGH (ref 0–44)
AST: 360 U/L — ABNORMAL HIGH (ref 15–41)
Albumin: 3.8 g/dL (ref 3.5–5.0)
Alkaline Phosphatase: 73 U/L (ref 38–126)
Anion gap: 11 (ref 5–15)
BUN: 28 mg/dL — ABNORMAL HIGH (ref 8–23)
CO2: 32 mmol/L (ref 22–32)
Calcium: 8.8 mg/dL — ABNORMAL LOW (ref 8.9–10.3)
Chloride: 85 mmol/L — ABNORMAL LOW (ref 98–111)
Creatinine, Ser: 2.39 mg/dL — ABNORMAL HIGH (ref 0.44–1.00)
GFR, Estimated: 22 mL/min — ABNORMAL LOW (ref >60)
Glucose, Bld: 111 mg/dL — ABNORMAL HIGH (ref 70–99)
Potassium: 2.1 mmol/L — CL (ref 3.5–5.1)
Sodium: 128 mmol/L — ABNORMAL LOW (ref 135–145)
Total Bilirubin: 0.5 mg/dL (ref 0.3–1.2)
Total Protein: 5.9 g/dL — ABNORMAL LOW (ref 6.5–8.1)

## 2021-01-30 LAB — RESP PANEL BY RT-PCR (FLU A&B, COVID) ARPGX2
Influenza A by PCR: NEGATIVE
Influenza B by PCR: NEGATIVE
SARS Coronavirus 2 by RT PCR: NEGATIVE

## 2021-01-30 LAB — TROPONIN I (HIGH SENSITIVITY)
Troponin I (High Sensitivity): 1429 ng/L (ref <18)
Troponin I (High Sensitivity): 2552 ng/L (ref <18)

## 2021-01-30 LAB — MAGNESIUM: Magnesium: 2 mg/dL (ref 1.7–2.4)

## 2021-01-30 MED ORDER — ROSUVASTATIN CALCIUM 20 MG PO TABS: 40.0000 mg | ORAL_TABLET | Freq: Every morning | ORAL | Status: DC

## 2021-01-30 MED ORDER — HEPARIN SODIUM (PORCINE) 5000 UNIT/ML IJ SOLN
5000.0000 [IU] | Freq: Three times a day (TID) | INTRAMUSCULAR | Status: DC
  Administered 2021-01-30 – 2021-02-01 (×5): 5000 [IU] via SUBCUTANEOUS
  Filled 2021-01-30 (×5): qty 1

## 2021-01-30 MED ORDER — LEVOTHYROXINE SODIUM 75 MCG PO TABS
37.5000 ug | ORAL_TABLET | ORAL | Status: DC
  Administered 2021-01-31: 37.5 ug via ORAL
  Filled 2021-01-30: qty 1

## 2021-01-30 MED ORDER — POTASSIUM CHLORIDE 10 MEQ/100ML IV SOLN
10.0000 meq | INTRAVENOUS | Status: AC
  Administered 2021-01-30 – 2021-01-31 (×5): 10 meq via INTRAVENOUS
  Filled 2021-01-30 (×5): qty 100

## 2021-01-30 MED ORDER — SODIUM CHLORIDE 0.9 % IV SOLN
250.0000 mL | INTRAVENOUS | Status: DC
  Administered 2021-01-31: 250 mL via INTRAVENOUS

## 2021-01-30 MED ORDER — CHLORHEXIDINE GLUCONATE CLOTH 2 % EX PADS
6.0000 | MEDICATED_PAD | Freq: Every day | CUTANEOUS | Status: DC
  Administered 2021-01-30 – 2021-02-01 (×2): 6 via TOPICAL

## 2021-01-30 MED ORDER — NOREPINEPHRINE 4 MG/250ML-% IV SOLN
2.0000 ug/min | INTRAVENOUS | Status: DC
  Administered 2021-01-30: 2 ug/min via INTRAVENOUS
  Filled 2021-01-30: qty 250

## 2021-01-30 MED ORDER — POTASSIUM CHLORIDE 10 MEQ/100ML IV SOLN
10.0000 meq | INTRAVENOUS | Status: AC
Start: 2021-01-30 — End: 2021-01-30
  Administered 2021-01-30 (×4): 10 meq via INTRAVENOUS
  Filled 2021-01-30 (×4): qty 100

## 2021-01-30 MED ORDER — LEVETIRACETAM 500 MG PO TABS
500.0000 mg | ORAL_TABLET | Freq: Two times a day (BID) | ORAL | Status: DC
  Administered 2021-01-30 – 2021-02-01 (×4): 500 mg via ORAL
  Filled 2021-01-30 (×4): qty 1

## 2021-01-30 MED ORDER — CALCIUM GLUCONATE-NACL 1-0.675 GM/50ML-% IV SOLN
1.0000 g | Freq: Once | INTRAVENOUS | Status: AC
  Administered 2021-01-30: 1000 mg via INTRAVENOUS
  Filled 2021-01-30: qty 50

## 2021-01-30 MED ORDER — SODIUM CHLORIDE 0.9 % IV BOLUS
1000.0000 mL | Freq: Once | INTRAVENOUS | Status: AC
  Administered 2021-01-30: 1000 mL via INTRAVENOUS

## 2021-01-30 MED ORDER — POTASSIUM CHLORIDE CRYS ER 20 MEQ PO TBCR
40.0000 meq | EXTENDED_RELEASE_TABLET | Freq: Two times a day (BID) | ORAL | Status: DC
  Administered 2021-01-30: 40 meq via ORAL
  Filled 2021-01-30 (×2): qty 2

## 2021-01-30 MED ORDER — LACTATED RINGERS IV BOLUS
1000.0000 mL | Freq: Once | INTRAVENOUS | Status: AC
  Administered 2021-01-30: 1000 mL via INTRAVENOUS

## 2021-01-30 MED ORDER — CLOPIDOGREL BISULFATE 75 MG PO TABS
75.0000 mg | ORAL_TABLET | Freq: Every morning | ORAL | Status: DC
  Administered 2021-01-31 – 2021-02-01 (×2): 75 mg via ORAL
  Filled 2021-01-30 (×2): qty 1

## 2021-01-30 MED ORDER — LACTATED RINGERS IV SOLN: INTRAVENOUS | Status: DC

## 2021-01-30 MED ORDER — EZETIMIBE 10 MG PO TABS
10.0000 mg | ORAL_TABLET | Freq: Every morning | ORAL | Status: DC
  Administered 2021-01-31 – 2021-02-01 (×2): 10 mg via ORAL
  Filled 2021-01-30 (×2): qty 1

## 2021-01-30 NOTE — ED Notes (Signed)
Tommy RN with [facility] has received report. Nyguen RN at [employer] has been given report.

## 2021-01-30 NOTE — H&P (Signed)
History and Physical    Olivia Walsh KAJ:[id number] DOB: 03-14-1960 DOA: 01/30/2021  PCP: [name], MD  Patient coming from: [facility]  I have personally briefly reviewed patient's old medical records in [place]  Chief Complaint: hypotension and hypokalemia  HPI: Olivia Walsh is a 60 y.o. female with medical history significant for CAD s/p CABG x 4 in 2002, cerebral aneurysm repair x2 , seizure, HTN, HLD, hypothyroidism who presents with concerns of hypotension and hyperkalemia.  Patient presented to her primary physician today for regular follow-up. She felt lightheaded today with standing.  She was noted to be hypotensive with BP 82/54.  She had labs drawn which later resulted with severe hypokalemia around 2.6 and was told to present to the ED. She takes labetalol and losartan and has been on it since she was 14 without changes.  No Nausea, vomiting or diarrhea.   States she does not drink water like she should but does still keep hydrated with soda and juices. She is having progression in her CKD from stage III to stage IV since August.  ED Course: She was afebrile, initially hypotensive in the 70s over 50s which improved with fluid.  No leukocytosis, hemoglobin of 11.8. Sodium initially 135 and 128. K of 2.4. Creatinine of 2.39 with possible baseline 2-3.   Review of Systems: Constitutional: No Weight Change, No Fever ENT/Mouth: No sore throat, No Rhinorrhea Eyes: No Eye Pain, No Vision Changes Cardiovascular: No Chest Pain, no SOB, No Edema Respiratory: No Cough, No Sputum Gastrointestinal: No Nausea, No Vomiting, No Diarrhea, No Constipation, No Pain Genitourinary: no Urinary Incontinence, Musculoskeletal: No Arthralgias, No Myalgias Skin: No Skin Lesions, No Pruritus, Neuro: no Weakness,No Loss of Consciousness, No Syncope Psych: No Anxiety/Panic, No Depression, no decrease appetite Heme/Lymph: No Bruising, No Bleeding   Past Medical  History:  Diagnosis Date   Aneurysm ([place])    CAD (coronary artery disease)    CKD (chronic kidney disease) stage 3, GFR 30-59 ml/min (HCC)    History of basal cell carcinoma excision 12/20/2017   Hyperlipidemia LDL goal <70    Hypertension    Hypothyroidism    Tobacco abuse    Vitamin D deficiency     Past Surgical History:  Procedure Laterality Date   CATARACT EXTRACTION, BILATERAL Bilateral 2020   CEREBRAL ANEURYSM REPAIR Right 2012   CEREBRAL ANEURYSM REPAIR Right 2005   CORONARY ARTERY BYPASS GRAFT  2002   Dr Cyndia Bent   EMBOLIZATION  2005   Embolization of right PICA aneurysm     reports that she has been smoking cigarettes. She started smoking about 47 years ago. She has a 46.00 pack-year smoking history. She has never used smokeless tobacco. She reports that she does not drink alcohol and does not use drugs. Social History  Allergies  Allergen Reactions   Ace Inhibitors Cough   Penicillins Other (See Comments)    "happened years ago" (reaction not recalled)   Prednisone Other (See Comments)    Dysphoria    Family History  Problem Relation Age of Onset   Brain cancer Father        brain   Leukemia Sister 80   Heart disease Mother    Heart attack Mother 80   Heart disease Brother    Heart attack Brother 18   Heart attack Maternal Grandfather    Heart disease Brother 18   Multiple sclerosis Sister      Prior to Admission medications   Medication Sig Start  Date End Date Taking? Authorizing Provider  clopidogrel (PLAVIX) 75 MG tablet Take  1 tablet  Daily  to Prevent  Blood Clots Patient taking differently: Take 75 mg by mouth in the morning. 08/17/20  Yes [name], MD  ezetimibe (ZETIA) 10 MG tablet TAKE 1 TABLET(10 MG) BY MOUTH DAILY Patient taking differently: Take 10 mg by mouth in the morning. 09/07/20  Yes Magda Bernheim, NP  levETIRAcetam (KEPPRA) 500 MG tablet TAKE 1 TABLET BY MOUTH TWICE DAILY TO PREVENT SEIZURES Patient taking differently: 500 mg  See admin instructions. Take 500 mg by mouth at 7 AM and 7 PM 09/07/20  Yes Magda Bernheim, NP  levothyroxine (SYNTHROID) 75 MCG tablet Take 1/2 tablet daily except skip Sunday. Take an empty stomach with only water for 30 minutes & no Antacid meds, Calcium or Magnesium for 4 hours & avoid Biotin Patient taking differently: Take 37.5 mcg by mouth See admin instructions. Take 37.5 mcg by mouth in the morning before breakfast on all days except Sundays (no medication on Sun)- on an empty stomach and with only water, 30 minutes before food. Take no antacid meds, Calcium or Magnesium for 4 hours & avoid Biotin. 05/20/20  Yes [name], MD  rosuvastatin (CRESTOR) 40 MG tablet TAKE 1 TABLET BY MOUTH  DAILY FOR CHOLESTEROL Patient taking differently: Take 40 mg by mouth in the morning. 05/02/20  Yes Liane Comber, NP  labetalol (NORMODYNE) 300 MG tablet TAKE 1 TABLET BY MOUTH TWO  TIMES DAILY Patient not taking: Reported on 01/30/2021 04/11/18   Liane Comber, NP  losartan (COZAAR) 50 MG tablet Take 50 mg by mouth daily. Patient not taking: Reported on 01/30/2021    [provider]    Physical Exam: Vitals:   01/30/21 1730 01/30/21 1800 01/30/21 1858 01/30/21 1900  BP: (!) 78/51 (!) 91/56 108/83 108/83  Pulse: 64 63 64 63  Resp: 15 13 14 11   Temp:   97.9 F (36.6 C)   TempSrc:   Oral   SpO2: 100% 100% 100% 100%    Constitutional: NAD, calm, comfortable, well appearing elderly female laying at appropriately 20 degree in bed Vitals:   01/30/21 1730 01/30/21 1800 01/30/21 1858 01/30/21 1900  BP: (!) 78/51 (!) 91/56 108/83 108/83  Pulse: 64 63 64 63  Resp: 15 13 14 11   Temp:   97.9 F (36.6 C)   TempSrc:   Oral   SpO2: 100% 100% 100% 100%   Eyes: PERRL, lids and conjunctivae normal ENMT: Mucous membranes are moist.  Neck: normal, supple Respiratory: clear to auscultation bilaterally, no wheezing, no crackles. Normal respiratory effort.  Cardiovascular: Regular rate and rhythm,  3/6 systolic murmur heard best at right sternal border. No extremity edema.  Abdomen: no tenderness, no masses palpated.  Bowel sounds positive.  Musculoskeletal: no clubbing / cyanosis. No joint deformity upper and lower extremities.  Skin: no rashes, lesions, ulcers.  Neurologic: CN 2-12 grossly intact. Strength 5/5 in all 4.  Psychiatric: Normal judgment and insight. Alert and oriented x 3. Normal mood.     Labs on Admission: I have personally reviewed following labs and imaging studies  CBC: Recent Labs  Lab 01/30/21 0919 01/30/21 1458  WBC 5.8 7.6  NEUTROABS 3,526 5.1  HGB 12.9 11.8*  HCT 38.7 35.2*  MCV 95.8 92.6  PLT 170 132   Basic Metabolic Panel: Recent Labs  Lab 01/30/21 0919 01/30/21 1458  NA 135 128*  K 2.6* 2.1*  CL 86* 85*  CO2 35* 32  GLUCOSE 95 111*  BUN 26* 28*  CREATININE 2.40* 2.39*  CALCIUM 9.2 8.8*  MG 2.2  --    GFR: Estimated Creatinine Clearance: 23.7 mL/min (A) (by C-G formula based on SCr of 2.39 mg/dL (H)). Liver Function Tests: Recent Labs  Lab 01/30/21 0919 01/30/21 1458  AST 339* 360*  ALT 270* 277*  ALKPHOS  --  73  BILITOT 0.5 0.5  PROT 6.2 5.9*  ALBUMIN  --  3.8   No results for input(s): LIPASE, AMYLASE in the last 168 hours. No results for input(s): AMMONIA in the last 168 hours. Coagulation Profile: No results for input(s): INR, PROTIME in the last 168 hours. Cardiac Enzymes: No results for input(s): CKTOTAL, CKMB, CKMBINDEX, TROPONINI in the last 168 hours. BNP (last 3 results) No results for input(s): PROBNP in the last 8760 hours. HbA1C: No results for input(s): HGBA1C in the last 72 hours. CBG: No results for input(s): GLUCAP in the last 168 hours. Lipid Profile: Recent Labs    01/30/21 0919  CHOL 94  HDL 35*  LDLCALC 40  TRIG 107  CHOLHDL 2.7   Thyroid Function Tests: Recent Labs    01/30/21 0919  TSH 9.18*   Anemia Panel: No results for input(s): VITAMINB12, FOLATE, FERRITIN, TIBC, IRON,  RETICCTPCT in the last 72 hours. Urine analysis:    Component Value Date/Time   COLORURINE YELLOW 09/29/2020 1440   APPEARANCEUR CLOUDY (A) 09/29/2020 1440   LABSPEC 1.007 09/29/2020 1440   PHURINE 5.5 09/29/2020 1440   GLUCOSEU NEGATIVE 09/29/2020 1440   HGBUR 2+ (A) 09/29/2020 1440   BILIRUBINUR NEGATIVE 12/20/2015 0956   KETONESUR NEGATIVE 09/29/2020 1440   PROTEINUR TRACE (A) 09/29/2020 1440   NITRITE NEGATIVE 09/29/2020 1440   LEUKOCYTESUR NEGATIVE 09/29/2020 1440    Radiological Exams on Admission: No results found.    Assessment/Plan  Hypotension unclear precipitant. No leukocytosis or symptoms to suggest infection.  She has been on labetalol 300 mg BID and losartan 50mg  for years. Possibly could be due to her progressing chronic kidney disease from stage 3 to 4 but her creatinine is no worse than usual today.  She had continued worsening of her blood pressure overnight despite 3 L of IV fluid resuscitation.  She was started on Levophed and BP improved on low dose.  -Given hx of CABG, troponin was obtained. Mild elevation was expected due to her hypotension but troponin returned with 1429 -->2552. Discussed this with cardiology Dr. Humphrey Rolls who recommends obtaining echo and continuing current management since she is not having any other cardiac symptoms or EKG changes.  -continue to hold Labetalol and Losartan  -added on blood cultures but again no signs of symptoms of infection/sepsis  Hypokalemia  replete with IV K x4  Mg at 2 obtain repeat BMP   Hyponatremia  obtain repeat BMP and continue with IV fluids  Hypocalcemia -give 1g calcium IV   Elevated TSH  Hx of Hypothyroidism TSH elevated to 9. Check free T4.  continue levothyroxone   CAD s/p CABG -pt has no chest pain or dyspnea but with profound hypotension will get updated echocardiogram -continue Plavix   Elevated LFTs Ongoing since August. Had negative outpatient ultrasound. Being workup by [facility]  outpt  CKD stage 4 Creatinine at 2.39 with baseline around 2-3  Hx of seizure continue Keppra   Hypothyrodism continue levothyroxone   HLD continue zetia     Level of care: Stepdown  Status is: Inpatient  Remains inpatient appropriate  because: Hypotension requiring aggressive IV fluid resuscitation          Orene Desanctis DO [facility]   If 7PM-7AM, please contact night-coverage [email or web address]   01/30/2021, 7:57 PM

## 2021-01-30 NOTE — Telephone Encounter (Signed)
Please advise ER of consult note from  10/04/20 w/ [facility]. Dr. Karle Starch, ER, reviewed.

## 2021-01-30 NOTE — Progress Notes (Signed)
Patient is a 60 year old female with history of CKD stage IV, coronary artery  disease status post CABG, seizure on Keppra who was sent to the emergency department by her PCP for the evaluation of low potassium and low blood pressure.  On presentation she was noted to be hypotensive but asymptomatic.  She takes labetalol and losartan at home.  She has been recently being more tired, weak, dizzy.  lab work on presentation showed severe hypokalemia with potassium of 2.1.  She has elevated liver enzymes which has been noticed for some time now.  She has been worked up with serologies and ultrasound but they are negative and she has been referred to [facility] for further work-up.   We were requested for admission for severe hypokalemia, hypotension.  We are not clear why she is hypotensive, no history of nausea or vomiting , no signs of sepsis.  But had poor oral intake at home. She follows with nephrology. Accepted to stepdown. Apparently there is improvement in the blood pressure after started on IV fluids ,so accepted to stepdown

## 2021-01-30 NOTE — ED Triage Notes (Addendum)
Pt arrives to ED with c/o abnormal lab and hypotension. Pt reports that she was seen in PCP office for HTN. Her PCP office found her K to be 2.1, decreased GFR, and increased LFTs and her blood pressure in the 41'L systolic.

## 2021-01-30 NOTE — ED Provider Notes (Signed)
[facility] Provider Note  CSN: [id number] Arrival date & time: 01/30/21 1442    History Chief Complaint  Patient presents with   Abnormal Lab   Hypotension    Olivia Walsh is a 60 y.o. female with history of CKD, CAD s/p CABG and seizures on Keppra was sent to the ED by PCP for low K. She was in their office this morning for routine follow up. She was noted to be hypotensive (SBP 80s) and advised to hold her Labetalol and Losartan. She was called later in the day when labs came back showing low K. She has been feeling a little more tired recently, weak/dizzy with standing but that subsides and she has been able to do her ADLs otherwise. Denies any decreased appetite, nausea, vomiting or diarrhea. No urinary symptoms. She has been drinking more water than usual recently. No fever, cough congestion. No headache.    Past Medical History:  Diagnosis Date   Aneurysm ([place])    CAD (coronary artery disease)    CKD (chronic kidney disease) stage 3, GFR 30-59 ml/min (HCC)    History of basal cell carcinoma excision 12/20/2017   Hyperlipidemia LDL goal <70    Hypertension    Hypothyroidism    Tobacco abuse    Vitamin D deficiency     Past Surgical History:  Procedure Laterality Date   CATARACT EXTRACTION, BILATERAL Bilateral 2020   CEREBRAL ANEURYSM REPAIR Right 2012   CEREBRAL ANEURYSM REPAIR Right 2005   CORONARY ARTERY BYPASS GRAFT  2002   Dr Cyndia Bent   EMBOLIZATION  2005   Embolization of right PICA aneurysm    Family History  Problem Relation Age of Onset   Brain cancer Father        brain   Leukemia Sister 58   Heart disease Mother    Heart attack Mother 51   Heart disease Brother    Heart attack Brother 74   Heart attack Maternal Grandfather    Heart disease Brother 7   Multiple sclerosis Sister     Social History   Tobacco Use   Smoking status: Every Day    Packs/day: 1.00    Years: 46.00    Pack years: 46.00    Types:  Cigarettes    Start date: 1975   Smokeless tobacco: Never   Tobacco comments:    1/2-2 ppd   Vaping Use   Vaping Use: Never used  Substance Use Topics   Alcohol use: No    Comment: rarely, 1-2 per year   Drug use: No     Home Medications Prior to Admission medications   Medication Sig Start Date End Date Taking? Authorizing Provider  clopidogrel (PLAVIX) 75 MG tablet Take  1 tablet  Daily  to Prevent  Blood Clots 08/17/20   [name], MD  ezetimibe (ZETIA) 10 MG tablet TAKE 1 TABLET(10 MG) BY MOUTH DAILY 09/07/20   Magda Bernheim, NP  labetalol (NORMODYNE) 300 MG tablet TAKE 1 TABLET BY MOUTH TWO  TIMES DAILY 04/11/18   Liane Comber, NP  levETIRAcetam (KEPPRA) 500 MG tablet TAKE 1 TABLET BY MOUTH TWICE DAILY TO PREVENT SEIZURES 09/07/20   Magda Bernheim, NP  levothyroxine (SYNTHROID) 75 MCG tablet Take 1/2 tablet daily except skip Sunday. Take an empty stomach with only water for 30 minutes & no Antacid meds, Calcium or Magnesium for 4 hours & avoid Biotin 05/20/20   [name], MD  losartan (COZAAR) 50 MG tablet Take  50 mg by mouth daily.    [provider]  rosuvastatin (CRESTOR) 40 MG tablet TAKE 1 TABLET BY MOUTH  DAILY FOR CHOLESTEROL Patient taking differently: TAKE 1 TABLET BY MOUTH  DAILY FOR CHOLESTEROL 05/02/20   Liane Comber, NP     Allergies    Ace inhibitors, Penicillins, and Prednisone   Review of Systems   Review of Systems A comprehensive review of systems was completed and negative except as noted in HPI.    Physical Exam BP (!) 115/56   Pulse 71   Temp 97.7 F (36.5 C) (Oral)   Resp 18   SpO2 100%   Physical Exam Vitals and nursing note reviewed.  Constitutional:      Appearance: Normal appearance.  HENT:     Head: Normocephalic and atraumatic.     Nose: Nose normal.     Mouth/Throat:     Mouth: Mucous membranes are moist.  Eyes:     Extraocular Movements: Extraocular movements intact.     Conjunctiva/sclera: Conjunctivae  normal.  Cardiovascular:     Rate and Rhythm: Normal rate.  Pulmonary:     Effort: Pulmonary effort is normal.     Breath sounds: Normal breath sounds.  Abdominal:     General: Abdomen is flat.     Palpations: Abdomen is soft.     Tenderness: There is no abdominal tenderness.  Musculoskeletal:        General: No swelling. Normal range of motion.     Cervical back: Neck supple.  Skin:    General: Skin is warm and dry.  Neurological:     General: No focal deficit present.     Mental Status: She is alert.  Psychiatric:        Mood and Affect: Mood normal.     ED Results / Procedures / Treatments   Labs (all labs ordered are listed, but only abnormal results are displayed) Labs Reviewed  COMPREHENSIVE METABOLIC PANEL - Abnormal; Notable for the following components:      Result Value   Sodium 128 (*)    Potassium 2.1 (*)    Chloride 85 (*)    Glucose, Bld 111 (*)    BUN 28 (*)    Creatinine, Ser 2.39 (*)    Calcium 8.8 (*)    Total Protein 5.9 (*)    AST 360 (*)    ALT 277 (*)    GFR, Estimated 22 (*)    All other components within normal limits  CBC WITH DIFFERENTIAL/PLATELET - Abnormal; Notable for the following components:   RBC 3.80 (*)    Hemoglobin 11.8 (*)    HCT 35.2 (*)    All other components within normal limits  RESP PANEL BY RT-PCR (FLU A&B, COVID) [id number]    EKG EKG Interpretation  Date/Time:  Monday January 30 2021 16:12:20 EST Ventricular Rate:  63 PR Interval:  183 QRS Duration: 129 QT Interval:  490 QTC Calculation: 502 R Axis:   71 Text Interpretation: Sinus rhythm Probable left atrial enlargement IVCD, consider atypical LBBB Since last tracing QRS duration has increased Confirmed by Calvert Cantor [phone]) on 01/30/2021 5:03:45 PM   Radiology No results found.  Procedures .Critical Care Performed by: Truddie Hidden, MD Authorized by: Truddie Hidden, MD   Critical care provider statement:    Critical care time (minutes):   30   Critical care time was exclusive of:  Separately billable procedures and treating other patients   Critical care was necessary  to treat or prevent imminent or life-threatening deterioration of the following conditions:  Circulatory failure and metabolic crisis   Critical care was time spent personally by me on the following activities:  Development of treatment plan with patient or surrogate, discussions with consultants, evaluation of patient's response to treatment, examination of patient, ordering and review of laboratory studies, ordering and review of radiographic studies, ordering and performing treatments and interventions, pulse oximetry, re-evaluation of patient's condition and review of old charts   Care discussed with: admitting provider    Medications Ordered in the ED Medications  potassium chloride 10 mEq in 100 mL IVPB (10 mEq Intravenous New Bag/Given 01/30/21 1652)  potassium chloride SA (KLOR-CON M) CR tablet 40 mEq (40 mEq Oral Given 01/30/21 1558)  lactated ringers bolus 1,000 mL (0 mLs Intravenous Stopped 01/30/21 1723)     MDM Rules/Calculators/A&P MDM Labs ordered in triage reviewed, CBC is unremarkable. CMP confirms markedly low K, Mag drawn earlier today was normal. She has CKD about at baseline. Na is also slightly low. Will begin oral and IV repletion as well as LR bolus for her low BP. She does not appear to be in shock, no fever, leukocytosis or infectious symptoms to suggest sepsis. She denies any CP, SOB. Doubt adrenal insufficiency or SIADH given low K. Could be related to her Keppra use. Will need admission for further evaluation.   ED Course  I have reviewed the triage vital signs and the nursing notes.  Pertinent labs & imaging results that were available during my care of the patient were reviewed by me and considered in my medical decision making (see chart for details).  Clinical Course as of 01/30/21 1736  Mon Jan 30, 2021  1705 Covid/Flu are neg.   [CS]  1723 BP continues to improve. Spoke with Dr. Tawanna Solo, Hospitalist, who will accept for admission . [CS]    Clinical Course User Index [CS] Truddie Hidden, MD    Final Clinical Impression(s) / ED Diagnoses Final diagnoses:  Hypokalemia  Hypotension, unspecified hypotension type    Rx / DC Orders ED Discharge Orders     None        Truddie Hidden, MD 01/30/21 1736

## 2021-01-31 ENCOUNTER — Inpatient Hospital Stay (HOSPITAL_COMMUNITY): Payer: 59

## 2021-01-31 ENCOUNTER — Encounter (HOSPITAL_COMMUNITY): Payer: Self-pay | Admitting: Internal Medicine

## 2021-01-31 ENCOUNTER — Encounter (HOSPITAL_COMMUNITY)
Admission: EM | Disposition: A | Discharge status: Home / Self Care | Payer: Self-pay | Source: Home / Self Care | Attending: Internal Medicine

## 2021-01-31 DIAGNOSIS — I214 Non-ST elevation (NSTEMI) myocardial infarction: Secondary | ICD-10-CM

## 2021-01-31 DIAGNOSIS — R011 Cardiac murmur, unspecified: Secondary | ICD-10-CM | POA: Diagnosis not present

## 2021-01-31 LAB — COMPLETE METABOLIC PANEL WITH GFR
AG Ratio: 1.7 (calc) (ref 1.0–2.5)
ALT: 270 U/L — ABNORMAL HIGH (ref 6–29)
AST: 339 U/L — ABNORMAL HIGH (ref 10–35)
Albumin: 3.9 g/dL (ref 3.6–5.1)
Alkaline phosphatase (APISO): 65 U/L (ref 37–153)
BUN/Creatinine Ratio: 11 (calc) (ref 6–22)
BUN: 26 mg/dL — ABNORMAL HIGH (ref 7–25)
CO2: 35 mmol/L — ABNORMAL HIGH (ref 20–32)
Calcium: 9.2 mg/dL (ref 8.6–10.4)
Chloride: 86 mmol/L — ABNORMAL LOW (ref 98–110)
Creat: 2.4 mg/dL — ABNORMAL HIGH (ref 0.50–1.05)
Globulin: 2.3 g/dL (calc) (ref 1.9–3.7)
Glucose, Bld: 95 mg/dL (ref 65–99)
Potassium: 2.6 mmol/L — CL (ref 3.5–5.3)
Sodium: 135 mmol/L (ref 135–146)
Total Bilirubin: 0.5 mg/dL (ref 0.2–1.2)
Total Protein: 6.2 g/dL (ref 6.1–8.1)
eGFR: 22 mL/min/{1.73_m2} — ABNORMAL LOW (ref >=60)

## 2021-01-31 LAB — LIPID PANEL
Cholesterol: 94 mg/dL (ref <200)
HDL: 35 mg/dL — ABNORMAL LOW (ref >=50)
LDL Cholesterol (Calc): 40 mg/dL (calc)
Non-HDL Cholesterol (Calc): 59 mg/dL (calc) (ref <130)
Total CHOL/HDL Ratio: 2.7 (calc) (ref <5.0)
Triglycerides: 107 mg/dL (ref <150)

## 2021-01-31 LAB — CBC WITH DIFFERENTIAL/PLATELET
Absolute Monocytes: 737 cells/uL (ref 200–950)
Basophils Absolute: 52 cells/uL (ref 0–200)
Basophils Relative: 0.9 %
Eosinophils Absolute: 197 cells/uL (ref 15–500)
Eosinophils Relative: 3.4 %
HCT: 38.7 % (ref 35.0–45.0)
Hemoglobin: 12.9 g/dL (ref 11.7–15.5)
Lymphs Abs: 1288 cells/uL (ref 850–3900)
MCH: 31.9 pg (ref 27.0–33.0)
MCHC: 33.3 g/dL (ref 32.0–36.0)
MCV: 95.8 fL (ref 80.0–100.0)
MPV: 10.6 fL (ref 7.5–12.5)
Monocytes Relative: 12.7 %
Neutro Abs: 3526 cells/uL (ref 1500–7800)
Neutrophils Relative %: 60.8 %
Platelets: 170 10*3/uL (ref 140–400)
RBC: 4.04 10*6/uL (ref 3.80–5.10)
RDW: 12.5 % (ref 11.0–15.0)
Total Lymphocyte: 22.2 %
WBC: 5.8 10*3/uL (ref 3.8–10.8)

## 2021-01-31 LAB — COMPREHENSIVE METABOLIC PANEL
ALT: 293 U/L — ABNORMAL HIGH (ref 0–44)
AST: 395 U/L — ABNORMAL HIGH (ref 15–41)
Albumin: 3.2 g/dL — ABNORMAL LOW (ref 3.5–5.0)
Alkaline Phosphatase: 62 U/L (ref 38–126)
Anion gap: 9 (ref 5–15)
BUN: 24 mg/dL — ABNORMAL HIGH (ref 8–23)
CO2: 26 mmol/L (ref 22–32)
Calcium: 7.9 mg/dL — ABNORMAL LOW (ref 8.9–10.3)
Chloride: 100 mmol/L (ref 98–111)
Creatinine, Ser: 2.08 mg/dL — ABNORMAL HIGH (ref 0.44–1.00)
GFR, Estimated: 26 mL/min — ABNORMAL LOW (ref >60)
Glucose, Bld: 107 mg/dL — ABNORMAL HIGH (ref 70–99)
Potassium: 2.9 mmol/L — ABNORMAL LOW (ref 3.5–5.1)
Sodium: 135 mmol/L (ref 135–145)
Total Bilirubin: 0.5 mg/dL (ref 0.3–1.2)
Total Protein: 5.7 g/dL — ABNORMAL LOW (ref 6.5–8.1)

## 2021-01-31 LAB — HEMOGLOBIN A1C
Hgb A1c MFr Bld: 5.5 % of total Hgb (ref <5.7)
Mean Plasma Glucose: 111 mg/dL
eAG (mmol/L): 6.2 mmol/L

## 2021-01-31 LAB — VITAMIN D 25 HYDROXY (VIT D DEFICIENCY, FRACTURES): Vit D, 25-Hydroxy: 114 ng/mL — ABNORMAL HIGH (ref 30–100)

## 2021-01-31 LAB — ECHOCARDIOGRAM COMPLETE
Area-P 1/2: 2.64 cm2
Height: 67 in
MV VTI: 3.84 cm2
S' Lateral: 2.4 cm
Weight: 2598.4 oz

## 2021-01-31 LAB — T4, FREE: Free T4: 0.41 ng/dL — ABNORMAL LOW (ref 0.61–1.12)

## 2021-01-31 LAB — TROPONIN I (HIGH SENSITIVITY)
Troponin I (High Sensitivity): 5938 ng/L (ref <18)
Troponin I (High Sensitivity): 5921 ng/L (ref <18)

## 2021-01-31 LAB — INSULIN, RANDOM: Insulin: 22.4 u[IU]/mL — ABNORMAL HIGH

## 2021-01-31 LAB — HEPATITIS PANEL, ACUTE
HCV Ab: NONREACTIVE
Hep A IgM: NONREACTIVE
Hep B C IgM: NONREACTIVE
Hepatitis B Surface Ag: NONREACTIVE

## 2021-01-31 LAB — CORTISOL: Cortisol, Plasma: 9.2 ug/dL

## 2021-01-31 LAB — MAGNESIUM: Magnesium: 2.2 mg/dL (ref 1.5–2.5)

## 2021-01-31 LAB — TSH: TSH: 9.18 mIU/L — ABNORMAL HIGH (ref 0.40–4.50)

## 2021-01-31 LAB — PHOSPHORUS: Phosphorus: 2.9 mg/dL (ref 2.5–4.6)

## 2021-01-31 SURGERY — LEFT HEART CATH AND CORS/GRAFTS ANGIOGRAPHY
Anesthesia: LOCAL

## 2021-01-31 MED ORDER — ACETAMINOPHEN 325 MG PO TABS
650.0000 mg | ORAL_TABLET | Freq: Four times a day (QID) | ORAL | Status: DC | PRN
  Administered 2021-02-01: 650 mg via ORAL
  Filled 2021-01-31: qty 2

## 2021-01-31 MED ORDER — SODIUM CHLORIDE 0.9 % IV SOLN: INTRAVENOUS | Status: AC

## 2021-01-31 MED ORDER — HYDRALAZINE HCL 20 MG/ML IJ SOLN: 10.0000 mg | INTRAMUSCULAR | Status: DC | PRN

## 2021-01-31 MED ORDER — PERFLUTREN LIPID MICROSPHERE
1.0000 mL | INTRAVENOUS | Status: AC | PRN
Start: 2021-01-31 — End: 2021-01-31
  Administered 2021-01-31: 2 mL via INTRAVENOUS

## 2021-01-31 MED ORDER — METOPROLOL TARTRATE 5 MG/5ML IV SOLN: 5.0000 mg | INTRAVENOUS | Status: DC | PRN

## 2021-01-31 MED ORDER — SENNOSIDES-DOCUSATE SODIUM 8.6-50 MG PO TABS: 1.0000 | ORAL_TABLET | Freq: Every evening | ORAL | Status: DC | PRN

## 2021-01-31 MED ORDER — TRAZODONE HCL 50 MG PO TABS: 50.0000 mg | ORAL_TABLET | Freq: Every evening | ORAL | Status: DC | PRN

## 2021-01-31 MED ORDER — LEVOTHYROXINE SODIUM 75 MCG PO TABS
75.0000 ug | ORAL_TABLET | Freq: Every day | ORAL | Status: DC
  Administered 2021-02-01: 75 ug via ORAL
  Filled 2021-01-31: qty 1

## 2021-01-31 MED ORDER — IPRATROPIUM-ALBUTEROL 0.5-2.5 (3) MG/3ML IN SOLN: 3.0000 mL | RESPIRATORY_TRACT | Status: DC | PRN

## 2021-01-31 MED ORDER — LACTATED RINGERS IV BOLUS
500.0000 mL | Freq: Once | INTRAVENOUS | Status: AC
  Administered 2021-01-31: 500 mL via INTRAVENOUS

## 2021-01-31 NOTE — Progress Notes (Signed)
PROGRESS NOTE    Olivia Walsh  EUM:[id number] DOB: 1960-04-18 DOA: 01/30/2021 PCP: [name], MD   Brief Narrative:  60 year old with history of CAD status post CABG in 2002, cerebral aneurysm repair X2, seizure, HTN, HLD, hypothyroidism admitted for hypotension and hypokalemia from PCPs office.  Patient also has had some progression of renal disease during this time.  Patient was started on aggressive electrolyte repletion and IV fluids.  No obvious evidence of infection.  Troponins continue to trend up therefore cardiology was consulted.  Echocardiogram ordered.   Assessment & Plan:   Principal Problem:   Hypotension Active Problems:   Hypothyroidism   Hyperlipidemia   CAD (coronary artery disease)   CKD (chronic kidney disease) stage 4, GFR 15-29 ml/min (HCC)   Hypokalemia   Hyponatremia   History of seizure  Hypotension - Unclear etiology.  No obvious evidence of infection at this time.  At home she had been on labetalol 200 mg twice daily, losartan 50 mg daily.  Continue giving IV fluids, monitor urine output. -Follow-up culture data -Levophed turned off this morning.  We will continue watching it.  If necessary reminded to start patient on midodrine  Elevated troponin 1.4K > 2.5k >5.9k History of CAD status post CABG - Patient does not have any chest pain suspect this is demand ischemia.  Continue trending troponin until peaks.  Cardiology consulted.  Echocardiogram ordered. -Continue Plavix  Hypokalemia/hyponatremia/hypocalcemia -Aggressive repletion ordered  Transaminitis -Unclear etiology.  Will check acute hepatitis panel.  Right upper quadrant ultrasound.  Hold Crestor  AKI on CKD stage IV -Baseline creatinine 2.0.  Admission creatinine 2.4.  Trending downwards with IV fluids.  History of seizures - On Keppra  Hypothyroidism - Elevated TSH, low T4.  Increase Synthroid dose 54mcg > 75 mcg - Check cortisol random  Hyperlipidemia - Zetia.  We  will hold Crestor   DVT prophylaxis: SQ Heparin Code Status: Full  Family Communication:    Status is: Inpatient  Remains inpatient appropriate because: Significant abnormal electrolytes also elevated troponin.  Unsafe for discharge.  Patient needs to remain in hospital for at least next 24-48 hours.    Subjective: Seen and examined at bedside.  Patient tells me she feels all right and she wants to go home.  I explained her that she has multiple electrolyte abnormality and elevated heart numbers therefore she is not safe enough to go home.  Review of Systems Otherwise negative except as per HPI, including: General: Denies fever, chills, night sweats or unintended weight loss. Resp: Denies cough, wheezing, shortness of breath. Cardiac: Denies chest pain, palpitations, orthopnea, paroxysmal nocturnal dyspnea. GI: Denies abdominal pain, nausea, vomiting, diarrhea or constipation GU: Denies dysuria, frequency, hesitancy or incontinence MS: Denies muscle aches, joint pain or swelling Neuro: Denies headache, neurologic deficits (focal weakness, numbness, tingling), abnormal gait Psych: Denies anxiety, depression, SI/HI/AVH Skin: Denies new rashes or lesions ID: Denies sick contacts, exotic exposures, travel  Examination:  General exam: Appears calm and comfortable  Respiratory system: Clear to auscultation. Respiratory effort normal. Cardiovascular system: S1 & S2 heard, RRR. No JVD, murmurs, rubs, gallops or clicks. No pedal edema. Gastrointestinal system: Abdomen is nondistended, soft and nontender. No organomegaly or masses felt. Normal bowel sounds heard. Central nervous system: Alert and oriented. No focal neurological deficits. Extremities: Symmetric 5 x 5 power. Skin: No rashes, lesions or ulcers Psychiatry: Judgement and insight appear normal. Mood & affect appropriate.     Objective: Vitals:   01/31/21 0430 01/31/21 0445 01/31/21 0500  01/31/21 0515  BP: 139/60 (!)  134/46 (!) 113/48 (!) 89/48  Pulse: (!) 59 (!) 58 (!) 58 62  Resp: 12 13 12 13   Temp:      TempSrc:      SpO2: 100% 99% 100% 96%    Intake/Output Summary (Last 24 hours) at 01/31/2021 0725 Last data filed at 01/31/2021 0500 Gross per 24 hour  Intake 2723.5 ml  Output 1250 ml  Net 1473.5 ml   There were no vitals filed for this visit.   Data Reviewed:   CBC: Recent Labs  Lab 01/30/21 0919 01/30/21 1458  WBC 5.8 7.6  NEUTROABS 3,526 5.1  HGB 12.9 11.8*  HCT 38.7 35.2*  MCV 95.8 92.6  PLT 170 127   Basic Metabolic Panel: Recent Labs  Lab 01/30/21 0919 01/30/21 1458 01/30/21 2016 01/31/21 0103  NA 135 128* 130* 135  K 2.6* 2.1* 2.7* 2.9*  CL 86* 85* 90* 100  CO2 35* 32 32 26  GLUCOSE 95 111* 109* 107*  BUN 26* 28* 28* 24*  CREATININE 2.40* 2.39* 2.43* 2.08*  CALCIUM 9.2 8.8* 7.7* 7.9*  MG 2.2  --  2.0  --    GFR: Estimated Creatinine Clearance: 27.3 mL/min (A) (by C-G formula based on SCr of 2.08 mg/dL (H)). Liver Function Tests: Recent Labs  Lab 01/30/21 0919 01/30/21 1458 01/31/21 0103  AST 339* 360* 395*  ALT 270* 277* 293*  ALKPHOS  --  73 62  BILITOT 0.5 0.5 0.5  PROT 6.2 5.9* 5.7*  ALBUMIN  --  3.8 3.2*   No results for input(s): LIPASE, AMYLASE in the last 168 hours. No results for input(s): AMMONIA in the last 168 hours. Coagulation Profile: No results for input(s): INR, PROTIME in the last 168 hours. Cardiac Enzymes: No results for input(s): CKTOTAL, CKMB, CKMBINDEX, TROPONINI in the last 168 hours. BNP (last 3 results) No results for input(s): PROBNP in the last 8760 hours. HbA1C: No results for input(s): HGBA1C in the last 72 hours. CBG: No results for input(s): GLUCAP in the last 168 hours. Lipid Profile: Recent Labs    01/30/21 0919  CHOL 94  HDL 35*  LDLCALC 40  TRIG 107  CHOLHDL 2.7   Thyroid Function Tests: Recent Labs    01/30/21 0919 01/31/21 0103  TSH 9.18*  --   FREET4  --  0.41*   Anemia Panel: No results  for input(s): VITAMINB12, FOLATE, FERRITIN, TIBC, IRON, RETICCTPCT in the last 72 hours. Sepsis Labs: No results for input(s): PROCALCITON, LATICACIDVEN in the last 168 hours.  Recent Results (from the past 240 hour(s))  Resp Panel by RT-PCR (Flu A&B, Covid) Nasopharyngeal Swab     Status: None   Collection Time: 01/30/21  4:14 PM   Specimen: Nasopharyngeal Swab; Nasopharyngeal(NP) swabs in vial transport medium  Result Value Ref Range Status   SARS Coronavirus 2 by RT PCR NEGATIVE NEGATIVE Final    Comment: (NOTE) SARS-CoV-2 target nucleic acids are NOT DETECTED.  The SARS-CoV-2 RNA is generally detectable in upper respiratory specimens during the acute phase of infection. The lowest concentration of SARS-CoV-2 viral copies this assay can detect is 138 copies/mL. A negative result does not preclude SARS-Cov-2 infection and should not be used as the sole basis for treatment or other patient management decisions. A negative result may occur with  improper specimen collection/handling, submission of specimen other than nasopharyngeal swab, presence of viral mutation(s) within the areas targeted by this assay, and inadequate number of viral copies(<138 copies/mL).  A negative result must be combined with clinical observations, patient history, and epidemiological information. The expected result is Negative.  Fact Sheet for Patients:  EntrepreneurPulse.com.au  Fact Sheet for Healthcare Providers:  IncredibleEmployment.be  This test is no t yet approved or cleared by the [place] FDA and  has been authorized for detection and/or diagnosis of SARS-CoV-2 by FDA under an Emergency Use Authorization (EUA). This EUA will remain  in effect (meaning this test can be used) for the duration of the COVID-19 declaration under Section 564(b)(1) of the Act, 21 U.S.C.section 360bbb-3(b)(1), unless the authorization is terminated  or revoked sooner.        Influenza A by PCR NEGATIVE NEGATIVE Final   Influenza B by PCR NEGATIVE NEGATIVE Final    Comment: (NOTE) The Xpert Xpress SARS-CoV-2/FLU/RSV plus assay is intended as an aid in the diagnosis of influenza from Nasopharyngeal swab specimens and should not be used as a sole basis for treatment. Nasal washings and aspirates are unacceptable for Xpert Xpress SARS-CoV-2/FLU/RSV testing.  Fact Sheet for Patients: EntrepreneurPulse.com.au  Fact Sheet for Healthcare Providers: IncredibleEmployment.be  This test is not yet approved or cleared by the [place] FDA and has been authorized for detection and/or diagnosis of SARS-CoV-2 by FDA under an Emergency Use Authorization (EUA). This EUA will remain in effect (meaning this test can be used) for the duration of the COVID-19 declaration under Section 564(b)(1) of the Act, 21 U.S.C. section 360bbb-3(b)(1), unless the authorization is terminated or revoked.  Performed at [facility], [address] [place], [place] [address]   MRSA Next Gen by PCR, Nasal     Status: None   Collection Time: 01/30/21  6:47 PM   Specimen: Nasal Mucosa; Nasal Swab  Result Value Ref Range Status   MRSA by PCR Next Gen NOT DETECTED NOT DETECTED Final    Comment: (NOTE) The GeneXpert MRSA Assay (FDA approved for NASAL specimens only), is one component of a comprehensive MRSA colonization surveillance program. It is not intended to diagnose MRSA infection nor to guide or monitor treatment for MRSA infections. Test performance is not FDA approved in patients less than 68 years old. Performed at [facility], [place] [address] [place], [place] [address]          Radiology Studies: No results found.      Scheduled Meds:  Chlorhexidine Gluconate Cloth  6 each Topical Daily   clopidogrel  75 mg Oral q AM   ezetimibe  10 mg Oral q AM   heparin  5,000 Units Subcutaneous Q8H    levETIRAcetam  500 mg Oral BID   levothyroxine  37.5 mcg Oral Once per day on Mon Tue Wed Thu Fri Sat   rosuvastatin  40 mg Oral q AM   Continuous Infusions:  sodium chloride 50 mL/hr at 01/31/21 0429   lactated ringers 100 mL/hr at 01/31/21 0429   norepinephrine (LEVOPHED) Adult infusion 3 mcg/min (01/31/21 0429)     LOS: 1 day   Time spent= 35 mins    Danelia Snodgrass Arsenio Loader, MD [facility]  If 7PM-7AM, please contact night-coverage  01/31/2021, 7:25 AM

## 2021-01-31 NOTE — Progress Notes (Signed)
============================================================ ============================================================  -    Labs reviewed - Patient was sent to hospital & Admitted  ============================================================ ============================================================

## 2021-01-31 NOTE — Progress Notes (Signed)
Echocardiogram 2D Echocardiogram has been performed.  Oneal Deputy Haakon Titsworth RDCS 01/31/2021, 1:07 PM

## 2021-01-31 NOTE — Consult Note (Addendum)
Cardiology Consultation:   Patient ID: Laneta Guerin MRN: [id number]; DOB: Sep 20, 1960  Admit date: 01/30/2021 Date of Consult: 01/31/2021  PCP:  [name], MD   [facility] Providers Cardiologist:  Mertie Moores, MD        Patient Profile:   Talynn Lebon is a 60 y.o. female with a hx of CABG x 4 in 2002, cerebral aneurysm repair x2, [place] 2012, seizure, HTN, HLD, hypothyroidism, CKD III, PAD, chronically elevated LFTs with negative eval, who is being seen 01/31/2021 for the evaluation of elevated troponin at the request of Dr Reesa Chew.  Cardiac cath 2011 with occluded native vessels, LIMA-LAD, SVG-proximal LAD, SVG-OM, SVG-diagonal, were patent, but the SVG-RCA was occluded  History of Present Illness:   Ms. Manring was last seen by Dr. Acie Fredrickson in 2018.  Creatinine at that time was 1.96, she was still smoking, weight 190 pounds, follow-up in a year.  She went to see Dr. Melford Aase on 12/05 and was hypotensive and weak, with a systolic blood pressure in the 80s.  Labs were checked and her potassium was 2.6.  She was sent to the emergency room and was admitted.  Her medications were held and she was given IV supplementation of her potassium.  She was  initially hypotensive in the 70s over 50s which improved with fluid.  No leukocytosis, hemoglobin of 11.8. Sodium initially 135 and 128. K of 2.4. Creatinine of 2.39 with possible baseline 2-3.   Her troponins were checked and BMET progressively more elevated, Cardiology asked to evaluate her.  Ms. Heitzenrater has had no chest pain.  She is not very active at home.  She works at home and walks the dog around the yard a little bit, but does not exercise regularly.  Because her husband quit smoking, she is smoking a lot less, but continues to smoke and has no desire to quit completely.  She has had some swelling in her legs since her bypass surgery, but this is chronic and has not changed recently.  She denies dyspnea on exertion,  orthopnea or PND.  She has not had palpitations, has not felt her heart race or skip.  The lightheaded symptoms have been going on a while.  She was having some orthostatic dizziness and lightheaded feeling.  The symptoms progressed to the point that she was just getting weaker and weaker.  She did not report the symptoms to Dr. Melford Aase and continue to take the losartan and the HCTZ.  Since being in the hospital, the lightheaded feeling is greatly improved.  She feels less weak than she did, but still significantly deconditioned.  The thing about the lightheaded symptoms that was very frightening to her is because she has dropfoot and has fallen multiple times.  Her legs have gotten extremely weak and she cannot get herself up off the ground if she falls.  Also, her husband had a stroke about 6 months ago when he cannot get her up off the ground.  She is scheduled to get some kind of nerve stimulation test tomorrow at 1:00.  She hopes she will make it.  She spilled about half of her Synthroid, and could not get it refilled because it was not time yet.  She has been off this medication for few weeks.  She has had a murmur for years.   Past Medical History:  Diagnosis Date   Aneurysm ([place])    CAD (coronary artery disease)    CKD (chronic kidney disease) stage 3, GFR 30-59 ml/min (  [place])    History of basal cell carcinoma excision 12/20/2017   Hyperlipidemia LDL goal <70    Hypertension    Hypothyroidism    Tobacco abuse    Vitamin D deficiency     Past Surgical History:  Procedure Laterality Date   CATARACT EXTRACTION, BILATERAL Bilateral 2020   CEREBRAL ANEURYSM REPAIR Right 2012   CEREBRAL ANEURYSM REPAIR Right 2005   CORONARY ARTERY BYPASS GRAFT  2002   Dr Cyndia Bent   EMBOLIZATION  2005   Embolization of right PICA aneurysm     Home Medications:  Prior to Admission medications   Medication Sig Start Date End Date Taking? Authorizing Provider  clopidogrel (PLAVIX) 75 MG tablet  Take  1 tablet  Daily  to Prevent  Blood Clots Patient taking differently: Take 75 mg by mouth in the morning. 08/17/20  Yes [name], MD  ezetimibe (ZETIA) 10 MG tablet TAKE 1 TABLET(10 MG) BY MOUTH DAILY Patient taking differently: Take 10 mg by mouth in the morning. 09/07/20  Yes Magda Bernheim, NP  levETIRAcetam (KEPPRA) 500 MG tablet TAKE 1 TABLET BY MOUTH TWICE DAILY TO PREVENT SEIZURES Patient taking differently: 500 mg See admin instructions. Take 500 mg by mouth at 7 AM and 7 PM 09/07/20  Yes Magda Bernheim, NP  levothyroxine (SYNTHROID) 75 MCG tablet Take 1/2 tablet daily except skip Sunday. Take an empty stomach with only water for 30 minutes & no Antacid meds, Calcium or Magnesium for 4 hours & avoid Biotin Patient taking differently: Take 37.5 mcg by mouth See admin instructions. Take 37.5 mcg by mouth in the morning before breakfast on all days except Sundays (no medication on Sun)- on an empty stomach and with only water, 30 minutes before food. Take no antacid meds, Calcium or Magnesium for 4 hours & avoid Biotin. 05/20/20  Yes [name], MD  rosuvastatin (CRESTOR) 40 MG tablet TAKE 1 TABLET BY MOUTH  DAILY FOR CHOLESTEROL Patient taking differently: Take 40 mg by mouth in the morning. 05/02/20  Yes Liane Comber, NP  labetalol (NORMODYNE) 300 MG tablet TAKE 1 TABLET BY MOUTH TWO  TIMES DAILY Patient not taking: Reported on 01/30/2021 04/11/18   Liane Comber, NP  losartan (COZAAR) 50 MG tablet Take 50 mg by mouth daily. Patient not taking: Reported on 01/30/2021    [provider]    Inpatient Medications: Scheduled Meds:  Chlorhexidine Gluconate Cloth  6 each Topical Daily   clopidogrel  75 mg Oral q AM   ezetimibe  10 mg Oral q AM   heparin  5,000 Units Subcutaneous Q8H   levETIRAcetam  500 mg Oral BID   levothyroxine  75 mcg Oral Q1500   Continuous Infusions:  sodium chloride 50 mL/hr at 01/31/21 0429   sodium chloride 100 mL/hr at 01/31/21 0900    norepinephrine (LEVOPHED) Adult infusion Stopped (01/31/21 0823)   PRN Meds: acetaminophen, hydrALAZINE, ipratropium-albuterol, metoprolol tartrate, senna-docusate, traZODone  Allergies:    Allergies  Allergen Reactions   Ace Inhibitors Cough   Penicillins Other (See Comments)    "happened years ago" (reaction not recalled)   Prednisone Other (See Comments)    Dysphoria    Social History:   Social History   Socioeconomic History   Marital status: Married    Spouse name: Not on file   Number of children: 0   Years of education: Not on file   Highest education level: Not on file  Occupational History   Not on file  Tobacco  Use   Smoking status: Every Day    Packs/day: 1.00    Years: 46.00    Pack years: 46.00    Types: Cigarettes    Start date: 36   Smokeless tobacco: Never   Tobacco comments:    1/2-2 ppd   Vaping Use   Vaping Use: Never used  Substance and Sexual Activity   Alcohol use: No    Comment: rarely, 1-2 per year   Drug use: No   Sexual activity: Yes    Partners: Male    Birth control/protection: Post-menopausal  Other Topics Concern   Not on file  Social History Narrative   Not on file   Social Determinants of Health   Financial Resource Strain: Not on file  Food Insecurity: Not on file  Transportation Needs: Not on file  Physical Activity: Not on file  Stress: Not on file  Social Connections: Not on file  Intimate Partner Violence: Not on file    Family History:   Family History  Problem Relation Age of Onset   Brain cancer Father        brain   Leukemia Sister 37   Heart disease Mother    Heart attack Mother 62   Heart disease Brother    Heart attack Brother 68   Heart attack Maternal Grandfather    Heart disease Brother 52   Multiple sclerosis Sister      ROS:  Please see the history of present illness.  All other ROS reviewed and negative.     Physical Exam/Data:   Vitals:   01/31/21 0645 01/31/21 0800 01/31/21 0832  01/31/21 0900  BP: (!) 130/48 117/63 (!) 134/54 91/67  Pulse: 64 78 (!) 136 (!) 142  Resp: 14 19 17 11   Temp:   (S) 97.6 F (36.4 C)   TempSrc:   (S) Oral   SpO2: 100% (!) 87% 99% 100%    Intake/Output Summary (Last 24 hours) at 01/31/2021 1037 Last data filed at 01/31/2021 0900 Gross per 24 hour  Intake 3673.7 ml  Output 1250 ml  Net 2423.7 ml   Last 3 Weights 01/30/2021 12/20/2020 12/09/2020  Weight (lbs) 162 lb 6.4 oz 159 lb 12.8 oz 156 lb  Weight (kg) 73.664 kg 72.485 kg 70.761 kg     There is no height or weight on file to calculate BMI.  General:  Well nourished, well developed, in no acute distress HEENT: normal Neck: no JVD Vascular: No carotid bruits; Distal pulses 2+ bilaterally Cardiac:  normal S1, S2; RRR; 3/6 murmur  Lungs:  clear to auscultation bilaterally, no wheezing, rhonchi or rales  Abd: soft, nontender, no hepatomegaly  Ext: no edema Musculoskeletal:  No deformities, BUE and BLE strength normal and equal Skin: warm and dry  Neuro:  CNs 2-12 intact, no focal abnormalities noted Psych:  Normal affect   EKG:  The EKG was personally reviewed and demonstrates:   12/05 ECG is sinus rhythm, heart rate 63, Q waves in 3 and aVF, no ST elevation.  The Q waves are not significantly changed from 04/2020 ECG Telemetry:  Telemetry was personally reviewed and demonstrates: Sinus rhythm  Relevant CV Studies:  ECHO: Ordered  ECHO: 2015 - Left ventricle: E/e&quot;>14.3 suggestive of elevated LV filling    pressures. The cavity size was normal. There was moderate focal    basal hypertrophy. Systolic function was vigorous. The estimated    ejection fraction was in the range of 65% to 70%. Wall motion was  normal; there were no regional wall motion abnormalities. There    was an increased relative contribution of atrial contraction to    ventricular filling.  - Aortic valve: Moderate focal calcification.  - Aorta: Ascending aortic diameter: 38 mm (S).  - Ascending  aorta: The ascending aorta was mildly dilated.  - Mitral valve: There was trivial regurgitation.  - Left atrium: The atrium was mildly dilated.  - Tricuspid valve: There was mild regurgitation.  Laboratory Data:  High Sensitivity Troponin:   Recent Labs  Lab 01/30/21 2016 01/30/21 2259 01/31/21 0818 01/31/21 0928  TROPONINIHS 1,429* 2,552* 5,938* 5,921*     Chemistry Recent Labs  Lab 01/30/21 0919 01/30/21 1458 01/30/21 2016 01/31/21 0103  NA 135 128* 130* 135  K 2.6* 2.1* 2.7* 2.9*  CL 86* 85* 90* 100  CO2 35* 32 32 26  GLUCOSE 95 111* 109* 107*  BUN 26* 28* 28* 24*  CREATININE 2.40* 2.39* 2.43* 2.08*  CALCIUM 9.2 8.8* 7.7* 7.9*  MG 2.2  --  2.0  --   GFRNONAA  --  22* 22* 26*  ANIONGAP  --  11 8 9     Recent Labs  Lab 01/30/21 0919 01/30/21 1458 01/31/21 0103  PROT 6.2 5.9* 5.7*  ALBUMIN  --  3.8 3.2*  AST 339* 360* 395*  ALT 270* 277* 293*  ALKPHOS  --  73 62  BILITOT 0.5 0.5 0.5   Lipids  Recent Labs  Lab 01/30/21 0919  CHOL 94  TRIG 107  HDL 35*  LDLCALC 40  CHOLHDL 2.7    Hematology Recent Labs  Lab 01/30/21 0919 01/30/21 1458  WBC 5.8 7.6  RBC 4.04 3.80*  HGB 12.9 11.8*  HCT 38.7 35.2*  MCV 95.8 92.6  MCH 31.9 31.1  MCHC 33.3 33.5  RDW 12.5 13.0  PLT 170 172   Thyroid  Recent Labs  Lab 01/30/21 0919 01/31/21 0103  TSH 9.18*  --   FREET4  --  0.41*    BNPNo results for input(s): BNP, PROBNP in the last 168 hours.  DDimer No results for input(s): DDIMER in the last 168 hours.   Radiology/Studies:  US Abdomen Limited RUQ (LIVER/GB)  Result Date: 01/31/2021 CLINICAL DATA:  Transaminitis. EXAM: ULTRASOUND ABDOMEN LIMITED RIGHT UPPER QUADRANT COMPARISON:  September 28, 2020.  October 22, 2020. FINDINGS: Gallbladder: Cholelithiasis is noted. No significant gallbladder wall thickening or pericholecystic fluid is noted. No sonographic Murphy's sign is noted. Common bile duct: Diameter: 6 mm which is within normal limits. Liver: No focal  lesion identified. Within normal limits in parenchymal echogenicity. Portal vein is patent on color Doppler imaging with normal direction of blood flow towards the liver. Other: Complex right renal cyst is again noted as described on prior MRI and ultrasound. IMPRESSION: Cholelithiasis is again noted. No definite evidence of cholecystitis is noted. Complex right renal cyst is again noted as described on prior MRI and ultrasound examinations. Referral to reports of those exams is recommended for follow-up. Electronically Signed   By: Marijo Conception M.D.   On: 01/31/2021 10:24   Abdominal ultrasound ordered, results pending  Assessment and Plan:   Non-STEMI by enzymes -Her cardiac enzymes have trended up and are now almost 6000 - This is in the setting of an acute illness and body stress from hypotension - she has had no ischemic symptoms, but admits that her activity level is poor - Her ECG is not significantly changed from previous - Her potassium is improved, but not  normal yet - Echo was ordered, follow-up on results -Continue Plavix, Zetia -She was not on aspirin prior to admission. - She was on labetalol 300 mg twice daily, but has not been taking this.  Once her blood pressure improves, can start a beta-blocker such as Toprol-XL. - Discuss further evaluation with MD  2.  Dehydration and electrolyte abnormalities - She has been hydrated and is still getting a low-dose of Levophed.  This should be able to be discontinued soon. -She has received extensive potassium supplementation IV and some p.o. - Her potassium has improved, but is not back to baseline -Her LFTs are chronically abnormal, but are much higher than usual. -Crestor has been held -She had been on HCTZ prior to admission and losartan 50 mg daily.  Both are discontinued -Per IM  3.  CKD IV -A year and a half ago, her creatinine was 2.27.  It has been as high as 3.78. - After holding meds and hydration, her creatinine is the  lowest its been since February 2021 -Management per IM   Risk Assessment/Risk Scores:     TIMI Risk Score for Unstable Angina or Non-ST Elevation MI:   The patient's TIMI risk score is 3, which indicates a  % risk of all cause mortality, new or recurrent myocardial infarction or need for urgent revascularization in the next 14 days.    For questions or updates, please contact [place] Please consult [email or web address] for contact info under    Signed, Rosaria Ferries, PA-C  01/31/2021 10:37 AM

## 2021-02-01 ENCOUNTER — Encounter: Payer: 59 | Admitting: Neurology

## 2021-02-01 DIAGNOSIS — I9589 Other hypotension: Secondary | ICD-10-CM

## 2021-02-01 DIAGNOSIS — I421 Obstructive hypertrophic cardiomyopathy: Secondary | ICD-10-CM

## 2021-02-01 LAB — COMPREHENSIVE METABOLIC PANEL
ALT: 204 U/L — ABNORMAL HIGH (ref 0–44)
AST: 235 U/L — ABNORMAL HIGH (ref 15–41)
Albumin: 2.5 g/dL — ABNORMAL LOW (ref 3.5–5.0)
Alkaline Phosphatase: 46 U/L (ref 38–126)
Anion gap: 7 (ref 5–15)
BUN: 21 mg/dL (ref 8–23)
CO2: 24 mmol/L (ref 22–32)
Calcium: 7.7 mg/dL — ABNORMAL LOW (ref 8.9–10.3)
Chloride: 106 mmol/L (ref 98–111)
Creatinine, Ser: 1.78 mg/dL — ABNORMAL HIGH (ref 0.44–1.00)
GFR, Estimated: 32 mL/min — ABNORMAL LOW (ref >60)
Glucose, Bld: 85 mg/dL (ref 70–99)
Potassium: 2.8 mmol/L — ABNORMAL LOW (ref 3.5–5.1)
Sodium: 137 mmol/L (ref 135–145)
Total Bilirubin: 0.3 mg/dL (ref 0.3–1.2)
Total Protein: 4.8 g/dL — ABNORMAL LOW (ref 6.5–8.1)

## 2021-02-01 LAB — CBC
HCT: 30.7 % — ABNORMAL LOW (ref 36.0–46.0)
Hemoglobin: 10.2 g/dL — ABNORMAL LOW (ref 12.0–15.0)
MCH: 31.9 pg (ref 26.0–34.0)
MCHC: 33.2 g/dL (ref 30.0–36.0)
MCV: 95.9 fL (ref 80.0–100.0)
Platelets: 123 10*3/uL — ABNORMAL LOW (ref 150–400)
RBC: 3.2 MIL/uL — ABNORMAL LOW (ref 3.87–5.11)
RDW: 13.2 % (ref 11.5–15.5)
WBC: 5.4 10*3/uL (ref 4.0–10.5)
nRBC: 0 % (ref 0.0–0.2)

## 2021-02-01 LAB — PHOSPHORUS: Phosphorus: 3.8 mg/dL (ref 2.5–4.6)

## 2021-02-01 LAB — POTASSIUM: Potassium: 3.9 mmol/L (ref 3.5–5.1)

## 2021-02-01 LAB — MAGNESIUM: Magnesium: 2 mg/dL (ref 1.7–2.4)

## 2021-02-01 MED ORDER — METOPROLOL SUCCINATE ER 25 MG PO TB24
25.0000 mg | ORAL_TABLET | Freq: Every day | ORAL | Status: DC
  Administered 2021-02-01: 25 mg via ORAL
  Filled 2021-02-01: qty 1

## 2021-02-01 MED ORDER — POTASSIUM CHLORIDE CRYS ER 20 MEQ PO TBCR
40.0000 meq | EXTENDED_RELEASE_TABLET | Freq: Two times a day (BID) | ORAL | Status: DC
  Administered 2021-02-01: 40 meq via ORAL
  Filled 2021-02-01: qty 2

## 2021-02-01 MED ORDER — LEVOTHYROXINE SODIUM 75 MCG PO TABS: 75.0000 ug | ORAL_TABLET | Freq: Every day | ORAL | 0 refills | Status: DC

## 2021-02-01 MED ORDER — METOPROLOL SUCCINATE ER 25 MG PO TB24: 25.0000 mg | ORAL_TABLET | Freq: Every day | ORAL | 0 refills | Status: DC

## 2021-02-01 MED ORDER — POTASSIUM CHLORIDE 10 MEQ/100ML IV SOLN
10.0000 meq | INTRAVENOUS | Status: AC
  Administered 2021-02-01 (×4): 10 meq via INTRAVENOUS
  Filled 2021-02-01 (×3): qty 100

## 2021-02-01 MED ORDER — POTASSIUM CHLORIDE CRYS ER 20 MEQ PO TBCR
40.0000 meq | EXTENDED_RELEASE_TABLET | ORAL | Status: AC
  Administered 2021-02-01 (×2): 40 meq via ORAL
  Filled 2021-02-01: qty 2

## 2021-02-01 NOTE — Progress Notes (Signed)
PROGRESS NOTE    Olivia Walsh  LOV:[id number] DOB: Dec 20, 1960 DOA: 01/30/2021 PCP: [name], MD   Brief Narrative:  60 year old with history of CAD status post CABG in 2002, cerebral aneurysm repair X2, seizure, HTN, HLD, hypothyroidism admitted for hypotension and hypokalemia from PCPs office.  Patient also has had some progression of renal disease during this time.  Patient was started on aggressive electrolyte repletion and IV fluids.  No obvious evidence of infection.  Troponins continue to trend up therefore cardiology was consulted.  Echocardiogram which showed severe LVOT, mild to moderate MR.  Cardiology following the patient.   Assessment & Plan:   Principal Problem:   Hypotension Active Problems:   Hypothyroidism   Hyperlipidemia   CAD (coronary artery disease)   CKD (chronic kidney disease) stage 4, GFR 15-29 ml/min (HCC)   Hypokalemia   Hyponatremia   History of seizure  Hypotension, resolved - Initially on Levophed now weaned off.  No obvious source of infection.  Home labetalol and losartan has been on hold.  Cardiology consulted for new medication adjustments as needed especially in the setting of abnormal echocardiogram  Elevated troponin 1.4K > 2.5k >5938 > 5921 History of CAD status post CABG Severe LVOT obstruction - Patient is chest pain-free.  On Plavix.  Echocardiogram shows severe LVOT obstruction, moderate MR.  Catheterized LHC has been deferred for now due to abnormal renal function and no evidence of chest pain.  Cardiology following. -Continue Plavix  Hypokalemia/hyponatremia/hypocalcemia -Aggressive repletion ordered  Transaminitis, improving -Unclear etiology.  Acute hepatitis panel negative. -RUQ ultrasound-cholelithiasis without cholecystitis.  Renal cyst on the right side  AKI on CKD stage IV - Baseline creatinine 1.8.  Admission creatinine 2.4.  Creatinine today 1.78  History of seizures - On Keppra  Hypothyroidism -  Elevated TSH, low T4.  Increase Synthroid dose 62mcg > 75 mcg - Random cortisol-normal  Hyperlipidemia - Zetia.  We will hold Crestor   DVT prophylaxis: SQ Heparin Code Status: Full  Family Communication:    Status is: Inpatient  Remains inpatient appropriate because: Maintain hospital stay until cleared by cardiology team.  In the meantime patient is getting aggressive IV repletion.  Hopefully discharge in next 24 hours  Subjective: Seen and examined at bedside, patient is really adamant she wants to go home today but I explained her given her abnormal echocardiogram she would require cardiac clearance in the meantime she also needs aggressive repletion of electrolytes.   Examination:  Constitutional: Not in acute distress Respiratory: Clear to auscultation bilaterally Cardiovascular: Normal sinus rhythm, no rubs Abdomen: Nontender nondistended good bowel sounds Musculoskeletal: No edema noted Skin: No rashes seen Neurologic: CN 2-12 grossly intact.  And nonfocal Psychiatric: Normal judgment and insight. Alert and oriented x 3. Normal mood.  Objective: Vitals:   02/01/21 0500 02/01/21 0600 02/01/21 0649 02/01/21 0800  BP: (!) 141/69 (!) 156/79    Pulse: 65 78 66   Resp: 15 17 (!) 21   Temp:    97.8 F (36.6 C)  TempSrc:    Oral  SpO2: 97% (!) 89% 99%   Weight:      Height:        Intake/Output Summary (Last 24 hours) at 02/01/2021 1015 Last data filed at 02/01/2021 0355 Gross per 24 hour  Intake 2711.69 ml  Output 1000 ml  Net 1711.69 ml   Filed Weights   01/31/21 1915  Weight: 73.7 kg     Data Reviewed:   CBC: Recent Labs  Lab 01/30/21  1610 01/30/21 1458 02/01/21 0313  WBC 5.8 7.6 5.4  NEUTROABS 3,526 5.1  --   HGB 12.9 11.8* 10.2*  HCT 38.7 35.2* 30.7*  MCV 95.8 92.6 95.9  PLT 170 172 960*   Basic Metabolic Panel: Recent Labs  Lab 01/30/21 0919 01/30/21 1458 01/30/21 2016 01/31/21 0103 01/31/21 0818 02/01/21 0313  NA 135 128* 130* 135   --  137  K 2.6* 2.1* 2.7* 2.9*  --  2.8*  CL 86* 85* 90* 100  --  106  CO2 35* 32 32 26  --  24  GLUCOSE 95 111* 109* 107*  --  85  BUN 26* 28* 28* 24*  --  21  CREATININE 2.40* 2.39* 2.43* 2.08*  --  1.78*  CALCIUM 9.2 8.8* 7.7* 7.9*  --  7.7*  MG 2.2  --  2.0  --   --  2.0  PHOS  --   --   --   --  2.9 3.8   GFR: Estimated Creatinine Clearance: 31.9 mL/min (A) (by C-G formula based on SCr of 1.78 mg/dL (H)). Liver Function Tests: Recent Labs  Lab 01/30/21 0919 01/30/21 1458 01/31/21 0103 02/01/21 0313  AST 339* 360* 395* 235*  ALT 270* 277* 293* 204*  ALKPHOS  --  73 62 46  BILITOT 0.5 0.5 0.5 0.3  PROT 6.2 5.9* 5.7* 4.8*  ALBUMIN  --  3.8 3.2* 2.5*   No results for input(s): LIPASE, AMYLASE in the last 168 hours. No results for input(s): AMMONIA in the last 168 hours. Coagulation Profile: No results for input(s): INR, PROTIME in the last 168 hours. Cardiac Enzymes: No results for input(s): CKTOTAL, CKMB, CKMBINDEX, TROPONINI in the last 168 hours. BNP (last 3 results) No results for input(s): PROBNP in the last 8760 hours. HbA1C: Recent Labs    01/30/21 0919  HGBA1C 5.5   CBG: No results for input(s): GLUCAP in the last 168 hours. Lipid Profile: Recent Labs    01/30/21 0919  CHOL 94  HDL 35*  LDLCALC 40  TRIG 107  CHOLHDL 2.7   Thyroid Function Tests: Recent Labs    01/30/21 0919 01/31/21 0103  TSH 9.18*  --   FREET4  --  0.41*   Anemia Panel: No results for input(s): VITAMINB12, FOLATE, FERRITIN, TIBC, IRON, RETICCTPCT in the last 72 hours. Sepsis Labs: No results for input(s): PROCALCITON, LATICACIDVEN in the last 168 hours.  Recent Results (from the past 240 hour(s))  Resp Panel by RT-PCR (Flu A&B, Covid) Nasopharyngeal Swab     Status: None   Collection Time: 01/30/21  4:14 PM   Specimen: Nasopharyngeal Swab; Nasopharyngeal(NP) swabs in vial transport medium  Result Value Ref Range Status   SARS Coronavirus 2 by RT PCR NEGATIVE NEGATIVE  Final    Comment: (NOTE) SARS-CoV-2 target nucleic acids are NOT DETECTED.  The SARS-CoV-2 RNA is generally detectable in upper respiratory specimens during the acute phase of infection. The lowest concentration of SARS-CoV-2 viral copies this assay can detect is 138 copies/mL. A negative result does not preclude SARS-Cov-2 infection and should not be used as the sole basis for treatment or other patient management decisions. A negative result may occur with  improper specimen collection/handling, submission of specimen other than nasopharyngeal swab, presence of viral mutation(s) within the areas targeted by this assay, and inadequate number of viral copies(<138 copies/mL). A negative result must be combined with clinical observations, patient history, and epidemiological information. The expected result is Negative.  Fact Sheet for  Patients:  EntrepreneurPulse.com.au  Fact Sheet for Healthcare Providers:  IncredibleEmployment.be  This test is no t yet approved or cleared by the [place] FDA and  has been authorized for detection and/or diagnosis of SARS-CoV-2 by FDA under an Emergency Use Authorization (EUA). This EUA will remain  in effect (meaning this test can be used) for the duration of the COVID-19 declaration under Section 564(b)(1) of the Act, 21 U.S.C.section 360bbb-3(b)(1), unless the authorization is terminated  or revoked sooner.       Influenza A by PCR NEGATIVE NEGATIVE Final   Influenza B by PCR NEGATIVE NEGATIVE Final    Comment: (NOTE) The Xpert Xpress SARS-CoV-2/FLU/RSV plus assay is intended as an aid in the diagnosis of influenza from Nasopharyngeal swab specimens and should not be used as a sole basis for treatment. Nasal washings and aspirates are unacceptable for Xpert Xpress SARS-CoV-2/FLU/RSV testing.  Fact Sheet for Patients: EntrepreneurPulse.com.au  Fact Sheet for Healthcare  Providers: IncredibleEmployment.be  This test is not yet approved or cleared by the [place] FDA and has been authorized for detection and/or diagnosis of SARS-CoV-2 by FDA under an Emergency Use Authorization (EUA). This EUA will remain in effect (meaning this test can be used) for the duration of the COVID-19 declaration under Section 564(b)(1) of the Act, 21 U.S.C. section 360bbb-3(b)(1), unless the authorization is terminated or revoked.  Performed at [facility], [address] [place], [place] [address]   MRSA Next Gen by PCR, Nasal     Status: None   Collection Time: 01/30/21  6:47 PM   Specimen: Nasal Mucosa; Nasal Swab  Result Value Ref Range Status   MRSA by PCR Next Gen NOT DETECTED NOT DETECTED Final    Comment: (NOTE) The GeneXpert MRSA Assay (FDA approved for NASAL specimens only), is one component of a comprehensive MRSA colonization surveillance program. It is not intended to diagnose MRSA infection nor to guide or monitor treatment for MRSA infections. Test performance is not FDA approved in patients less than 61 years old. Performed at [facility], [place] [address] [place], [place] [address]   Culture, blood (routine x 2)     Status: None (Preliminary result)   Collection Time: 01/31/21  1:03 AM   Specimen: BLOOD LEFT WRIST  Result Value Ref Range Status   Specimen Description   Final    BLOOD LEFT WRIST Performed at [place] [address], [place], [place] [address]    Special Requests   Final    BOTTLES DRAWN AEROBIC ONLY Blood Culture adequate volume Performed at [facility] [address] [place], [place] [address]    Culture   Final    NO GROWTH 1 DAY Performed at [facility], [place] [address], [place], [place] [address]    Report Status PENDING  Incomplete  Culture, blood (routine x 2)     Status: None (Preliminary result)   Collection Time:  01/31/21  1:03 AM   Specimen: BLOOD RIGHT HAND  Result Value Ref Range Status   Specimen Description   Final    BLOOD RIGHT HAND Performed at [facility], [place] [address], [place], [place] [address]    Special Requests   Final    BOTTLES DRAWN AEROBIC ONLY Blood Culture adequate volume Performed at [place] [address], [place], [place] [address]    Culture   Final    NO GROWTH 1 DAY Performed at [facility], [place] [address] [place],  [address]    Report Status PENDING  Incomplete         Radiology Studies: ECHOCARDIOGRAM COMPLETE  Result Date: 01/31/2021    ECHOCARDIOGRAM REPORT   Patient Name:   HENSLEY AZIZ [facility] Date of Exam: 01/31/2021 Medical Rec #:  [id number]           Height:       67.0 in Accession #:    [id number]          Weight:       162.4 lb Date of Birth:  09-29-1960           BSA:          1.851 m Patient Age:    35 years            BP:           87/53 mmHg Patient Gender: F                   HR:           65 bpm. Exam Location:  Inpatient Procedure: 2D Echo, Color Doppler, Cardiac Doppler and Intracardiac            Opacification Agent                                MODIFIED REPORT:     This report was modified by Rudean Haskell MD on 01/31/2021 due to                                   Comparison.  Indications:     R01.1 Murmur  History:         Patient has prior history of Echocardiogram examinations, most                  recent 09/17/2013. Prior CABG; Risk Factors:Hypertension and                  Dyslipidemia.  Sonographer:     Raquel Sarna Senior RDCS Referring Phys:  [id number] [place] [facility] Diagnosing Phys: Rudean Haskell MD IMPRESSIONS  1. Left ventricular ejection fraction, by estimation, is 60 to 65%. The left ventricle has normal function. The left ventricle has no regional wall motion abnormalities. There is severe concentric left ventricular hypertrophy. Left ventricular diastolic  parameters are indeterminate. In lieu  of other systemic disease, this may be consider with hypetrophic cardiomyopathy: septal variant. There is a severe LVOT obstruction: peak gradient is 102 mm Hg.  2. The inferior vena cava is normal in size with greater than 50% respiratory variability, suggesting right atrial pressure of 3 mmHg.  3. The mitral valve is grossly normal; there is systolic anterior motion of the mitral valve. At least moderate mitral valve regurgitation related to LVOTO. No evidence of mitral stenosis.  4. Right ventricular systolic function is normal. The right ventricular size is normal. Tricuspid regurgitation signal is inadequate for assessing PA pressure.  5. The aortic valve is tricuspid. There is mild calcification of the aortic valve. There is mild thickening of the aortic valve. Aortic valve regurgitation is not visualized. No aortic stenosis is present. Comparison(s): Mitral regurgitation and hypertrophy are new. FINDINGS  Left Ventricle: Left ventricular ejection fraction, by estimation, is 60 to 65%. The left ventricle has normal function. The  left ventricle has no regional wall motion abnormalities. Definity contrast agent was given IV to delineate the left ventricular  endocardial borders. The left ventricular internal cavity size was small. There is severe concentric left ventricular hypertrophy. Left ventricular diastolic parameters are indeterminate. Right Ventricle: The right ventricular size is normal. Right vetricular wall thickness was not well visualized. Right ventricular systolic function is normal. Tricuspid regurgitation signal is inadequate for assessing PA pressure. Left Atrium: Left atrial size was normal in size. Right Atrium: Right atrial size was normal in size. Pericardium: There is no evidence of pericardial effusion. Mitral Valve: The mitral valve is grossly normal. Moderate mitral valve regurgitation. No evidence of mitral valve stenosis. MV peak gradient, 5.5 mmHg. The mean mitral valve gradient is  2.0 mmHg. Tricuspid Valve: The tricuspid valve is normal in structure. Tricuspid valve regurgitation is not demonstrated. No evidence of tricuspid stenosis. Aortic Valve: The aortic valve is tricuspid. There is mild calcification of the aortic valve. There is mild thickening of the aortic valve. Aortic valve regurgitation is not visualized. No aortic stenosis is present. Pulmonic Valve: The pulmonic valve was normal in structure. Pulmonic valve regurgitation is not visualized. No evidence of pulmonic stenosis. Aorta: The aortic root and ascending aorta are structurally normal, with no evidence of dilitation. Venous: The inferior vena cava is normal in size with greater than 50% respiratory variability, suggesting right atrial pressure of 3 mmHg. IAS/Shunts: The atrial septum is grossly normal.  LEFT VENTRICLE PLAX 2D LVIDd:         3.20 cm   Diastology LVIDs:         2.40 cm   LV e' medial:    3.92 cm/s LV PW:         1.60 cm   LV E/e' medial:  27.6 LV IVS:        1.50 cm   LV e' lateral:   5.11 cm/s LVOT diam:     2.20 cm   LV E/e' lateral: 21.1 LV SV:         141 LV SV Index:   76 LVOT Area:     3.80 cm  RIGHT VENTRICLE RV S prime:     8.05 cm/s TAPSE (M-mode): 1.7 cm LEFT ATRIUM           Index        RIGHT ATRIUM           Index LA diam:      4.20 cm 2.27 cm/m   RA Area:     11.30 cm LA Vol (A2C): 70.7 ml 38.20 ml/m  RA Volume:   26.40 ml  14.26 ml/m LA Vol (A4C): 40.8 ml 22.04 ml/m  AORTIC VALVE LVOT Vmax:   183.00 cm/s LVOT Vmean:  142.000 cm/s LVOT VTI:    0.371 m  AORTA Ao Root diam: 3.40 cm Ao Asc diam:  3.60 cm MITRAL VALVE MV Area (PHT): 2.64 cm     SHUNTS MV Area VTI:   3.84 cm     Systemic VTI:  0.37 m MV Peak grad:  5.5 mmHg     Systemic Diam: 2.20 cm MV Mean grad:  2.0 mmHg MV Vmax:       1.17 m/s MV Vmean:      69.6 cm/s MV Decel Time: 287 msec MV E velocity: 108.00 cm/s MV A velocity: 145.00 cm/s MV E/A ratio:  0.74 Rudean Haskell MD Electronically signed by Rudean Haskell MD  Signature Date/Time: 01/31/2021/1:43:45 PM  Final (Updated)    US Abdomen Limited RUQ (LIVER/GB)  Result Date: 01/31/2021 CLINICAL DATA:  Transaminitis. EXAM: ULTRASOUND ABDOMEN LIMITED RIGHT UPPER QUADRANT COMPARISON:  September 28, 2020.  October 22, 2020. FINDINGS: Gallbladder: Cholelithiasis is noted. No significant gallbladder wall thickening or pericholecystic fluid is noted. No sonographic Murphy's sign is noted. Common bile duct: Diameter: 6 mm which is within normal limits. Liver: No focal lesion identified. Within normal limits in parenchymal echogenicity. Portal vein is patent on color Doppler imaging with normal direction of blood flow towards the liver. Other: Complex right renal cyst is again noted as described on prior MRI and ultrasound. IMPRESSION: Cholelithiasis is again noted. No definite evidence of cholecystitis is noted. Complex right renal cyst is again noted as described on prior MRI and ultrasound examinations. Referral to reports of those exams is recommended for follow-up. Electronically Signed   By: Marijo Conception M.D.   On: 01/31/2021 10:24        Scheduled Meds:  Chlorhexidine Gluconate Cloth  6 each Topical Daily   clopidogrel  75 mg Oral q AM   ezetimibe  10 mg Oral q AM   heparin  5,000 Units Subcutaneous Q8H   levETIRAcetam  500 mg Oral BID   levothyroxine  75 mcg Oral Q1500   potassium chloride  40 mEq Oral Q4H   Continuous Infusions:  sodium chloride 50 mL/hr at 01/31/21 0429   norepinephrine (LEVOPHED) Adult infusion Stopped (01/31/21 0823)   potassium chloride 10 mEq (02/01/21 0857)     LOS: 2 days   Time spent= 35 mins    Sherley Mckenney Arsenio Loader, MD [facility]  If 7PM-7AM, please contact night-coverage  02/01/2021, 10:15 AM

## 2021-02-01 NOTE — Plan of Care (Signed)
Patient denies pain or respiratory difficulty. Occasional lower blood pressures, but not sustained. Remains off pressors at this time.   Problem: Education: Goal: Knowledge of General Education information will improve Description: Including pain rating scale, medication(s)/side effects and non-pharmacologic comfort measures Outcome: Progressing   Problem: Health Behavior/Discharge Planning: Goal: Ability to manage health-related needs will improve Outcome: Progressing   Problem: Clinical Measurements: Goal: Ability to maintain clinical measurements within normal limits will improve Outcome: Progressing Goal: Will remain free from infection Outcome: Progressing Goal: Diagnostic test results will improve Outcome: Progressing Goal: Respiratory complications will improve Outcome: Progressing Goal: Cardiovascular complication will be avoided Outcome: Progressing   Problem: Activity: Goal: Risk for activity intolerance will decrease Outcome: Progressing   Problem: Nutrition: Goal: Adequate nutrition will be maintained Outcome: Progressing   Problem: Coping: Goal: Level of anxiety will decrease Outcome: Progressing   Problem: Elimination: Goal: Will not experience complications related to bowel motility Outcome: Progressing Goal: Will not experience complications related to urinary retention Outcome: Progressing   Problem: Pain Managment: Goal: General experience of comfort will improve Outcome: Progressing   Problem: Safety: Goal: Ability to remain free from injury will improve Outcome: Progressing   Problem: Skin Integrity: Goal: Risk for impaired skin integrity will decrease Outcome: Progressing

## 2021-02-01 NOTE — Progress Notes (Signed)
Progress Note  Patient Name: Olivia Walsh Date of Encounter: 02/01/2021  [facility] Cardiologist: Mertie Moores, MD   Subjective   No acute overnight events. Patient is feeling great and is very eager to go home. She denies any chest pain, shortness of breath, palpitations, lightheadedness/syncope. She has noticed some swelling in her hands and her rings are fitting tighter. BP has improved. No recurrent hypotension.  Inpatient Medications    Scheduled Meds:  Chlorhexidine Gluconate Cloth  6 each Topical Daily   clopidogrel  75 mg Oral q AM   ezetimibe  10 mg Oral q AM   heparin  5,000 Units Subcutaneous Q8H   levETIRAcetam  500 mg Oral BID   levothyroxine  75 mcg Oral Q1500   potassium chloride  40 mEq Oral Q4H   Continuous Infusions:  sodium chloride 50 mL/hr at 01/31/21 0429   potassium chloride Stopped (02/01/21 1043)   PRN Meds: acetaminophen, hydrALAZINE, ipratropium-albuterol, metoprolol tartrate, senna-docusate, traZODone   Vital Signs    Vitals:   02/01/21 0649 02/01/21 0700 02/01/21 0800 02/01/21 0900  BP:  (!) 160/72 (!) 169/83 133/68  Pulse: 66 64 63 91  Resp: (!) 21 16 16  (!) 24  Temp:   97.8 F (36.6 C)   TempSrc:   Oral   SpO2: 99% 97% 99% 99%  Weight:      Height:        Intake/Output Summary (Last 24 hours) at 02/01/2021 1128 Last data filed at 02/01/2021 1000 Gross per 24 hour  Intake 3517.2 ml  Output 400 ml  Net 3117.2 ml   Last 3 Weights 01/31/2021 01/30/2021 12/20/2020  Weight (lbs) 162 lb 7.7 oz 162 lb 6.4 oz 159 lb 12.8 oz  Weight (kg) 73.7 kg 73.664 kg 72.485 kg      Telemetry    Normal sinus rhythm with rates in the 60s to 90s. - Personally Reviewed  ECG    No new ECG tracing today. - Personally Reviewed  Physical Exam   GEN: No acute distress.   Neck: No JVD. Cardiac: RRR. II-III/VI systolic murmurs. No rubs or gallops. Radial pulses 2+ and equal bilaterally. Respiratory: Clear to auscultation bilaterally. No  wheezes, rhonchi, or rales. GI: Soft, non-distended, and non-tender. MS: Trace lower extremity edema bilaterally (left chronically larger than right). No deformity. Neuro:  No focal deficits. Psych: Normal affect. Responds appropriately.  Labs    High Sensitivity Troponin:   Recent Labs  Lab 01/30/21 2016 01/30/21 2259 01/31/21 0818 01/31/21 0928  TROPONINIHS 1,429* 2,552* 5,938* 5,921*     Chemistry Recent Labs  Lab 01/30/21 0919 01/30/21 0919 01/30/21 1458 01/30/21 2016 01/31/21 0103 02/01/21 0313  NA 135  --  128* 130* 135 137  K 2.6*  --  2.1* 2.7* 2.9* 2.8*  CL 86*  --  85* 90* 100 106  CO2 35*  --  32 32 26 24  GLUCOSE 95  --  111* 109* 107* 85  BUN 26*  --  28* 28* 24* 21  CREATININE 2.40*   < > 2.39* 2.43* 2.08* 1.78*  CALCIUM 9.2  --  8.8* 7.7* 7.9* 7.7*  MG 2.2  --   --  2.0  --  2.0  PROT 6.2  --  5.9*  --  5.7* 4.8*  ALBUMIN  --   --  3.8  --  3.2* 2.5*  AST 339*  --  360*  --  395* 235*  ALT 270*  --  277*  --  293* 204*  ALKPHOS  --   --  73  --  62 46  BILITOT 0.5  --  0.5  --  0.5 0.3  GFRNONAA  --    < > 22* 22* 26* 32*  ANIONGAP  --    < > 11 8 9 7    < > = values in this interval not displayed.    Lipids  Recent Labs  Lab 01/30/21 0919  CHOL 94  TRIG 107  HDL 35*  LDLCALC 40  CHOLHDL 2.7    Hematology Recent Labs  Lab 01/30/21 0919 01/30/21 1458 02/01/21 0313  WBC 5.8 7.6 5.4  RBC 4.04 3.80* 3.20*  HGB 12.9 11.8* 10.2*  HCT 38.7 35.2* 30.7*  MCV 95.8 92.6 95.9  MCH 31.9 31.1 31.9  MCHC 33.3 33.5 33.2  RDW 12.5 13.0 13.2  PLT 170 172 123*   Thyroid  Recent Labs  Lab 01/30/21 0919 01/31/21 0103  TSH 9.18*  --   FREET4  --  0.41*    BNPNo results for input(s): BNP, PROBNP in the last 168 hours.  DDimer No results for input(s): DDIMER in the last 168 hours.   Radiology    ECHOCARDIOGRAM COMPLETE  Result Date: 01/31/2021    ECHOCARDIOGRAM REPORT   Patient Name:   [name] [facility] Date of Exam: 01/31/2021 Medical Rec  #:  [id number]           Height:       67.0 in Accession #:    [id number]          Weight:       162.4 lb Date of Birth:  Mar 22, 1960           BSA:          1.851 m Patient Age:    60 years            BP:           87/53 mmHg Patient Gender: F                   HR:           65 bpm. Exam Location:  Inpatient Procedure: 2D Echo, Color Doppler, Cardiac Doppler and Intracardiac            Opacification Agent                                MODIFIED REPORT:     This report was modified by Rudean Haskell MD on 01/31/2021 due to                                   Comparison.  Indications:     R01.1 Murmur  History:         Patient has prior history of Echocardiogram examinations, most                  recent 09/17/2013. Prior CABG; Risk Factors:Hypertension and                  Dyslipidemia.  Sonographer:     Raquel Sarna Senior RDCS Referring Phys:  [id number] [facility] Diagnosing Phys: Rudean Haskell MD IMPRESSIONS  1. Left ventricular ejection fraction, by estimation, is 60 to 65%. The left ventricle has normal function. The left ventricle has no regional wall motion abnormalities. There is  severe concentric left ventricular hypertrophy. Left ventricular diastolic  parameters are indeterminate. In lieu of other systemic disease, this may be consider with hypetrophic cardiomyopathy: septal variant. There is a severe LVOT obstruction: peak gradient is 102 mm Hg.  2. The inferior vena cava is normal in size with greater than 50% respiratory variability, suggesting right atrial pressure of 3 mmHg.  3. The mitral valve is grossly normal; there is systolic anterior motion of the mitral valve. At least moderate mitral valve regurgitation related to LVOTO. No evidence of mitral stenosis.  4. Right ventricular systolic function is normal. The right ventricular size is normal. Tricuspid regurgitation signal is inadequate for assessing PA pressure.  5. The aortic valve is tricuspid. There is mild calcification of the aortic valve.  There is mild thickening of the aortic valve. Aortic valve regurgitation is not visualized. No aortic stenosis is present. Comparison(s): Mitral regurgitation and hypertrophy are new. FINDINGS  Left Ventricle: Left ventricular ejection fraction, by estimation, is 60 to 65%. The left ventricle has normal function. The left ventricle has no regional wall motion abnormalities. Definity contrast agent was given IV to delineate the left ventricular  endocardial borders. The left ventricular internal cavity size was small. There is severe concentric left ventricular hypertrophy. Left ventricular diastolic parameters are indeterminate. Right Ventricle: The right ventricular size is normal. Right vetricular wall thickness was not well visualized. Right ventricular systolic function is normal. Tricuspid regurgitation signal is inadequate for assessing PA pressure. Left Atrium: Left atrial size was normal in size. Right Atrium: Right atrial size was normal in size. Pericardium: There is no evidence of pericardial effusion. Mitral Valve: The mitral valve is grossly normal. Moderate mitral valve regurgitation. No evidence of mitral valve stenosis. MV peak gradient, 5.5 mmHg. The mean mitral valve gradient is 2.0 mmHg. Tricuspid Valve: The tricuspid valve is normal in structure. Tricuspid valve regurgitation is not demonstrated. No evidence of tricuspid stenosis. Aortic Valve: The aortic valve is tricuspid. There is mild calcification of the aortic valve. There is mild thickening of the aortic valve. Aortic valve regurgitation is not visualized. No aortic stenosis is present. Pulmonic Valve: The pulmonic valve was normal in structure. Pulmonic valve regurgitation is not visualized. No evidence of pulmonic stenosis. Aorta: The aortic root and ascending aorta are structurally normal, with no evidence of dilitation. Venous: The inferior vena cava is normal in size with greater than 50% respiratory variability, suggesting right  atrial pressure of 3 mmHg. IAS/Shunts: The atrial septum is grossly normal.  LEFT VENTRICLE PLAX 2D LVIDd:         3.20 cm   Diastology LVIDs:         2.40 cm   LV e' medial:    3.92 cm/s LV PW:         1.60 cm   LV E/e' medial:  27.6 LV IVS:        1.50 cm   LV e' lateral:   5.11 cm/s LVOT diam:     2.20 cm   LV E/e' lateral: 21.1 LV SV:         141 LV SV Index:   76 LVOT Area:     3.80 cm  RIGHT VENTRICLE RV S prime:     8.05 cm/s TAPSE (M-mode): 1.7 cm LEFT ATRIUM           Index        RIGHT ATRIUM           Index LA diam:  4.20 cm 2.27 cm/m   RA Area:     11.30 cm LA Vol (A2C): 70.7 ml 38.20 ml/m  RA Volume:   26.40 ml  14.26 ml/m LA Vol (A4C): 40.8 ml 22.04 ml/m  AORTIC VALVE LVOT Vmax:   183.00 cm/s LVOT Vmean:  142.000 cm/s LVOT VTI:    0.371 m  AORTA Ao Root diam: 3.40 cm Ao Asc diam:  3.60 cm MITRAL VALVE MV Area (PHT): 2.64 cm     SHUNTS MV Area VTI:   3.84 cm     Systemic VTI:  0.37 m MV Peak grad:  5.5 mmHg     Systemic Diam: 2.20 cm MV Mean grad:  2.0 mmHg MV Vmax:       1.17 m/s MV Vmean:      69.6 cm/s MV Decel Time: 287 msec MV E velocity: 108.00 cm/s MV A velocity: 145.00 cm/s MV E/A ratio:  0.74 Rudean Haskell MD Electronically signed by Rudean Haskell MD Signature Date/Time: 01/31/2021/1:43:45 PM    Final (Updated)    US Abdomen Limited RUQ (LIVER/GB)  Result Date: 01/31/2021 CLINICAL DATA:  Transaminitis. EXAM: ULTRASOUND ABDOMEN LIMITED RIGHT UPPER QUADRANT COMPARISON:  September 28, 2020.  October 22, 2020. FINDINGS: Gallbladder: Cholelithiasis is noted. No significant gallbladder wall thickening or pericholecystic fluid is noted. No sonographic Murphy's sign is noted. Common bile duct: Diameter: 6 mm which is within normal limits. Liver: No focal lesion identified. Within normal limits in parenchymal echogenicity. Portal vein is patent on color Doppler imaging with normal direction of blood flow towards the liver. Other: Complex right renal cyst is again noted as  described on prior MRI and ultrasound. IMPRESSION: Cholelithiasis is again noted. No definite evidence of cholecystitis is noted. Complex right renal cyst is again noted as described on prior MRI and ultrasound examinations. Referral to reports of those exams is recommended for follow-up. Electronically Signed   By: Marijo Conception M.D.   On: 01/31/2021 10:24    Cardiac Studies   Echocardiogram 01/31/2021: Impressions: 1. Left ventricular ejection fraction, by estimation, is 60 to 65%. The  left ventricle has normal function. The left ventricle has no regional  wall motion abnormalities. There is severe concentric left ventricular  hypertrophy. Left ventricular diastolic   parameters are indeterminate. In lieu of other systemic disease, this may  be consider with hypetrophic cardiomyopathy: septal variant. There is a  severe LVOT obstruction: peak gradient is 102 mm Hg.   2. The inferior vena cava is normal in size with greater than 50%  respiratory variability, suggesting right atrial pressure of 3 mmHg.   3. The mitral valve is grossly normal; there is systolic anterior motion  of the mitral valve. At least moderate mitral valve regurgitation related  to LVOTO. No evidence of mitral stenosis.   4. Right ventricular systolic function is normal. The right ventricular  size is normal. Tricuspid regurgitation signal is inadequate for assessing  PA pressure.   5. The aortic valve is tricuspid. There is mild calcification of the  aortic valve. There is mild thickening of the aortic valve. Aortic valve  regurgitation is not visualized. No aortic stenosis is present.   Comparison(s): Mitral regurgitation and hypertrophy are new.   Patient Profile     60 y.o. female with a history of CAD s/p CABG x4 in 2002, PAD s/p bilateral iliac stents in 2011, subarachnoid hemorrhage s/p cerebral aneurysm repair x2, hypertension, hyperlipidemia, chronically elevated LFTs, CKD stage III, and seizure disorder  who was  admitted on 01/30/2021 with hypotension, AKI, and hypokalemia. Cardiology was consulted for further evaluation of elevated troponin.  Assessment & Plan    NSTEMI Patient was admitted with hypotension, AKI, and hypokalemia and was found to have a markedly elevated troponin. High-sensitivity troponin peaked at 5,938. EKG showed no acute changes. Patient denies any chest pain or shortness of breath. Echo showed normal LV function with evidence of HOCM. Given renal function with baseline creatinine >2 and no symptoms, no plans for cardiac catheterization. Will treat medically. - Continue Plavix 75mg  dialy. - Previously on Labetolol but reported not taking this on admission. Will add Toprol-XL 25mg  daily given HOCM.  - Continue Crestor 40mg  daily and Zetia 10mg  daily.  Hypertrophic Cardiomyopathy Echo this admission showed LVEF of 60-65% with normal wall motion, severe LVH, and severe LVOT obstruction with peak gradient of 102 mmHg. Also showed at least moderate MR related to LVOT. No signs or symptoms of CHF. - Will start Toprol-XL 25mg  daily given BP has improved. - Would avoid diuretics and vasodilator agents that will lower pre-load. Discussed importance of staying well hydrated/avoiding dehydration.    Hypertension History of hypertension but hypotensive on admission with systolic BP in the 16-10R. Initially required Levophed and and IV hydration. Levophed was stopped yesterday. BP improved. Systolic BP in the 604V earlier this morning but most recently in the 130s.  - Will start Toprol-XL 25mg  daily.  - Would avoid diuretics and vasodilator agents as above given hypertrophic cardiomyopathy.  Hyperlipidemia Lipid panel this admission: Total Cholesterol 94, Triglycerides 107, HDL 35, LDL 40. LDL goal <55.  - Continue Crestor 40mg  daily and Zetia 10mg  daily.  Acute on CKD Stage III-IV Creatinine 2.4 on admission. Baseline between 2 to 3. - Improved to 1.78 after IV fluids. - Continue  to monitor closely.  Hypokalemia Potassium 2.1 on admission. She has been aggressively repleted and potassium still 2.8.  - Primary team is continuing to replete.  - Management per primary team.  Chronic Elevated LFTs Stable. Work-up in the past has been unremarkable.  Disposition: Patient is stable from a cardiac standpoint. No additional inpatient work-up necessary. However, recommend potassium be above >3.0 before discharge and she will need labs rechecked shortly after discharge. Patient states this can be done at her PCPs office. MD to follow with additional recommendations.   For questions or updates, please contact [place] Please consult [email or web address] for contact info under        Signed, Darreld Mclean, PA-C  02/01/2021, 11:28 AM

## 2021-02-02 ENCOUNTER — Telehealth: Payer: Self-pay

## 2021-02-02 NOTE — Discharge Summary (Addendum)
Physician Discharge Summary  Olivia Walsh KVQ:[id number] DOB: 1961/02/06 DOA: 01/30/2021  PCP: [name], MD  Admit date: 01/30/2021 Discharge date: 02/01/21  Admitted From: Home Disposition: Home  Recommendations for Outpatient Follow-up:  Follow up with PCP in 1-2 weeks Please obtain CMP/CBC/magnesium/TSH in one week your next doctors visit.  Outpatient follow-up with cardiology service to be arranged by them Advised to hold Crestor until seen by outpatient PCP Toprol-XL initiated Labetalol and losartan discontinued Synthroid increased to 75 mcg daily  Home Health: Equipment/Devices: Discharge Condition: Stable CODE STATUS:  Diet recommendation:   Brief/Interim Summary: 60 year old with history of CAD status post CABG in 2002, cerebral aneurysm repair X2, seizure, HTN, HLD, hypothyroidism admitted for hypotension and hypokalemia from PCPs office.  Patient also has had some progression of renal disease during this time.  Patient was started on aggressive electrolyte repletion and IV fluids.  No obvious evidence of infection.  Troponins continue to trend up therefore cardiology was consulted.  Echocardiogram which showed severe LVOT, mild to moderate MR.  Cardiology following the patient.    Hypotension, resolved - Initially on Levophed now weaned off.  No obvious source of infection.  Home labetalol and losartan has been on hold.  Cardiology consulted for new medication adjustments as needed especially in the setting of abnormal echocardiogram   Elevated troponin 1.4K > 2.5k >5938 > 5921 History of CAD status post CABG Severe LVOT obstruction - Patient is chest pain-free.  On Plavix.  Echocardiogram shows severe LVOT obstruction, moderate MR.  Catheterized LHC has been deferred for now due to abnormal renal function and no evidence of chest pain.  Cardiology recommended outpatient follow-up -Continue Plavix.  Toprol-XL was started, labetalol and losartan were  discontinued   Hypokalemia/hyponatremia/hypocalcemia -Aggressive repletion ordered   Transaminitis, improving -Unclear etiology.  Acute hepatitis panel negative. -RUQ ultrasound-cholelithiasis without cholecystitis.  Renal cyst on the right side   AKI on CKD stage IV - Baseline creatinine 1.8.  Admission creatinine 2.4.  Creatinine today 1.78   History of seizures - On Keppra   Hypothyroidism - Elevated TSH, low T4.  Increase Synthroid dose 15mcg > 75 mcg - Random cortisol-normal   Hyperlipidemia - Zetia.  We will hold Crestor, resume once repeat LFTs have been performed by PCP-been reviewed  Body mass index is 25.45 kg/m.         Discharge Diagnoses:  Principal Problem:   Hypotension Active Problems:   Hypothyroidism   Hyperlipidemia   CAD (coronary artery disease)   CKD (chronic kidney disease) stage 4, GFR 15-29 ml/min (HCC)   Hypokalemia   Hyponatremia   History of seizure   HOCM (hypertrophic obstructive cardiomyopathy) ([facility])      Consultations: Cardiology  Subjective: Feeling much better, back to her baseline.  She insisted on going home.  Repeat lab work on Monday was unremarkable.  Discharge Exam: Vitals:   02/01/21 1500 02/01/21 1600  BP: 123/77 (!) 144/65  Pulse: (!) 58 61  Resp: (!) 21 20  Temp:  98.1 F (36.7 C)  SpO2: 100% 100%   Vitals:   02/01/21 0900 02/01/21 1200 02/01/21 1500 02/01/21 1600  BP: 133/68  123/77 (!) 144/65  Pulse: 91  (!) 58 61  Resp: (!) 24  (!) 21 20  Temp:  97.9 F (36.6 C)  98.1 F (36.7 C)  TempSrc:  Oral  Oral  SpO2: 99%  100% 100%  Weight:      Height:        General: Pt is  alert, awake, not in acute distress Cardiovascular: RRR, S1/S2 +, no rubs, no gallops Respiratory: CTA bilaterally, no wheezing, no rhonchi Abdominal: Soft, NT, ND, bowel sounds + Extremities: no edema, no cyanosis  Discharge Instructions   Allergies as of 02/01/2021       Reactions   Ace Inhibitors Cough   Penicillins  Other (See Comments)   "happened years ago" (reaction not recalled)   Prednisone Other (See Comments)   Dysphoria        Medication List     STOP taking these medications    labetalol 300 MG tablet Commonly known as: NORMODYNE   losartan 50 MG tablet Commonly known as: COZAAR       TAKE these medications    clopidogrel 75 MG tablet Commonly known as: PLAVIX Take  1 tablet  Daily  to Prevent  Blood Clots What changed:  how much to take how to take this when to take this additional instructions   ezetimibe 10 MG tablet Commonly known as: ZETIA TAKE 1 TABLET(10 MG) BY MOUTH DAILY What changed:  how much to take how to take this when to take this additional instructions   levETIRAcetam 500 MG tablet Commonly known as: KEPPRA TAKE 1 TABLET BY MOUTH TWICE DAILY TO PREVENT SEIZURES What changed:  how much to take when to take this additional instructions   levothyroxine 75 MCG tablet Commonly known as: SYNTHROID Take 1 tablet (75 mcg total) by mouth daily in the afternoon. What changed:  how much to take how to take this when to take this additional instructions   metoprolol succinate 25 MG 24 hr tablet Commonly known as: TOPROL-XL Take 1 tablet (25 mg total) by mouth daily.   rosuvastatin 40 MG tablet Commonly known as: CRESTOR TAKE 1 TABLET BY MOUTH  DAILY FOR CHOLESTEROL What changed:  how much to take how to take this when to take this additional instructions        Follow-up Information     Nahser, Wonda Cheng, MD Follow up.   Specialty: Cardiology Why: Our office will call you to schedule a follow-up visit. Contact information: [place] [address] [phone]         [name], MD. Call in 1 week(s).   Specialty: Internal Medicine Contact information: [address] [place] [address] [phone]         Nahser, Wonda Cheng, MD .   Specialty: Cardiology Contact  information: [address] [place] [address] [phone]                Allergies  Allergen Reactions   Ace Inhibitors Cough   Penicillins Other (See Comments)    "happened years ago" (reaction not recalled)   Prednisone Other (See Comments)    Dysphoria    You were cared for by a hospitalist during your hospital stay. If you have any questions about your discharge medications or the care you received while you were in the hospital after you are discharged, you can call the unit and asked to speak with the hospitalist on call if the hospitalist that took care of you is not available. Once you are discharged, your primary care physician will handle any further medical issues. Please note that no refills for any discharge medications will be authorized once you are discharged, as it is imperative that you return to your primary care physician (or establish a relationship with a primary care physician if you do  not have one) for your aftercare needs so that they can reassess your need for medications and monitor your lab values.   Procedures/Studies: ECHOCARDIOGRAM COMPLETE  Result Date: 01/31/2021    ECHOCARDIOGRAM REPORT   Patient Name:   Olivia Walsh [facility] Date of Exam: 01/31/2021 Medical Rec #:  [id number]           Height:       67.0 in Accession #:    [id number]          Weight:       162.4 lb Date of Birth:  [birth year]           BSA:          1.851 m Patient Age:    5 years            BP:           87/53 mmHg Patient Gender: F                   HR:           65 bpm. Exam Location:  Inpatient Procedure: 2D Echo, Color Doppler, Cardiac Doppler and Intracardiac            Opacification Agent                                MODIFIED REPORT:     This report was modified by Rudean Haskell MD on 01/31/2021 due to                                   Comparison.  Indications:     R01.1 Murmur  History:         Patient has prior history of Echocardiogram examinations, most                   recent 09/17/2013. Prior CABG; Risk Factors:Hypertension and                  Dyslipidemia.  Sonographer:     Raquel Sarna Senior RDCS Referring Phys:  [id number] [place] [facility] Diagnosing Phys: Rudean Haskell MD IMPRESSIONS  1. Left ventricular ejection fraction, by estimation, is 60 to 65%. The left ventricle has normal function. The left ventricle has no regional wall motion abnormalities. There is severe concentric left ventricular hypertrophy. Left ventricular diastolic  parameters are indeterminate. In lieu of other systemic disease, this may be consider with hypetrophic cardiomyopathy: septal variant. There is a severe LVOT obstruction: peak gradient is 102 mm Hg.  2. The inferior vena cava is normal in size with greater than 50% respiratory variability, suggesting right atrial pressure of 3 mmHg.  3. The mitral valve is grossly normal; there is systolic anterior motion of the mitral valve. At least moderate mitral valve regurgitation related to LVOTO. No evidence of mitral stenosis.  4. Right ventricular systolic function is normal. The right ventricular size is normal. Tricuspid regurgitation signal is inadequate for assessing PA pressure.  5. The aortic valve is tricuspid. There is mild calcification of the aortic valve. There is mild thickening of the aortic valve. Aortic valve regurgitation is not visualized. No aortic stenosis is present. Comparison(s): Mitral regurgitation and hypertrophy are new. FINDINGS  Left Ventricle: Left ventricular ejection fraction, by estimation, is 60 to 65%. The left ventricle has normal function. The left ventricle has no  regional wall motion abnormalities. Definity contrast agent was given IV to delineate the left ventricular  endocardial borders. The left ventricular internal cavity size was small. There is severe concentric left ventricular hypertrophy. Left ventricular diastolic parameters are indeterminate. Right Ventricle: The right ventricular size is  normal. Right vetricular wall thickness was not well visualized. Right ventricular systolic function is normal. Tricuspid regurgitation signal is inadequate for assessing PA pressure. Left Atrium: Left atrial size was normal in size. Right Atrium: Right atrial size was normal in size. Pericardium: There is no evidence of pericardial effusion. Mitral Valve: The mitral valve is grossly normal. Moderate mitral valve regurgitation. No evidence of mitral valve stenosis. MV peak gradient, 5.5 mmHg. The mean mitral valve gradient is 2.0 mmHg. Tricuspid Valve: The tricuspid valve is normal in structure. Tricuspid valve regurgitation is not demonstrated. No evidence of tricuspid stenosis. Aortic Valve: The aortic valve is tricuspid. There is mild calcification of the aortic valve. There is mild thickening of the aortic valve. Aortic valve regurgitation is not visualized. No aortic stenosis is present. Pulmonic Valve: The pulmonic valve was normal in structure. Pulmonic valve regurgitation is not visualized. No evidence of pulmonic stenosis. Aorta: The aortic root and ascending aorta are structurally normal, with no evidence of dilitation. Venous: The inferior vena cava is normal in size with greater than 50% respiratory variability, suggesting right atrial pressure of 3 mmHg. IAS/Shunts: The atrial septum is grossly normal.  LEFT VENTRICLE PLAX 2D LVIDd:         3.20 cm   Diastology LVIDs:         2.40 cm   LV e' medial:    3.92 cm/s LV PW:         1.60 cm   LV E/e' medial:  27.6 LV IVS:        1.50 cm   LV e' lateral:   5.11 cm/s LVOT diam:     2.20 cm   LV E/e' lateral: 21.1 LV SV:         141 LV SV Index:   76 LVOT Area:     3.80 cm  RIGHT VENTRICLE RV S prime:     8.05 cm/s TAPSE (M-mode): 1.7 cm LEFT ATRIUM           Index        RIGHT ATRIUM           Index LA diam:      4.20 cm 2.27 cm/m   RA Area:     11.30 cm LA Vol (A2C): 70.7 ml 38.20 ml/m  RA Volume:   26.40 ml  14.26 ml/m LA Vol (A4C): 40.8 ml 22.04 ml/m   AORTIC VALVE LVOT Vmax:   183.00 cm/s LVOT Vmean:  142.000 cm/s LVOT VTI:    0.371 m  AORTA Ao Root diam: 3.40 cm Ao Asc diam:  3.60 cm MITRAL VALVE MV Area (PHT): 2.64 cm     SHUNTS MV Area VTI:   3.84 cm     Systemic VTI:  0.37 m MV Peak grad:  5.5 mmHg     Systemic Diam: 2.20 cm MV Mean grad:  2.0 mmHg MV Vmax:       1.17 m/s MV Vmean:      69.6 cm/s MV Decel Time: 287 msec MV E velocity: 108.00 cm/s MV A velocity: 145.00 cm/s MV E/A ratio:  0.74 Rudean Haskell MD Electronically signed by Rudean Haskell MD Signature Date/Time: 01/31/2021/1:43:45 PM    Final (Updated)  US Abdomen Limited RUQ (LIVER/GB)  Result Date: 01/31/2021 CLINICAL DATA:  Transaminitis. EXAM: ULTRASOUND ABDOMEN LIMITED RIGHT UPPER QUADRANT COMPARISON:  September 28, 2020.  October 22, 2020. FINDINGS: Gallbladder: Cholelithiasis is noted. No significant gallbladder wall thickening or pericholecystic fluid is noted. No sonographic Murphy's sign is noted. Common bile duct: Diameter: 6 mm which is within normal limits. Liver: No focal lesion identified. Within normal limits in parenchymal echogenicity. Portal vein is patent on color Doppler imaging with normal direction of blood flow towards the liver. Other: Complex right renal cyst is again noted as described on prior MRI and ultrasound. IMPRESSION: Cholelithiasis is again noted. No definite evidence of cholecystitis is noted. Complex right renal cyst is again noted as described on prior MRI and ultrasound examinations. Referral to reports of those exams is recommended for follow-up. Electronically Signed   By: Marijo Conception M.D.   On: 01/31/2021 10:24     The results of significant diagnostics from this hospitalization (including imaging, microbiology, ancillary and laboratory) are listed below for reference.     Microbiology: Recent Results (from the past 240 hour(s))  Resp Panel by RT-PCR (Flu A&B, Covid) Nasopharyngeal Swab     Status: None   Collection Time:  01/30/21  4:14 PM   Specimen: Nasopharyngeal Swab; Nasopharyngeal(NP) swabs in vial transport medium  Result Value Ref Range Status   SARS Coronavirus 2 by RT PCR NEGATIVE NEGATIVE Final    Comment: (NOTE) SARS-CoV-2 target nucleic acids are NOT DETECTED.  The SARS-CoV-2 RNA is generally detectable in upper respiratory specimens during the acute phase of infection. The lowest concentration of SARS-CoV-2 viral copies this assay can detect is 138 copies/mL. A negative result does not preclude SARS-Cov-2 infection and should not be used as the sole basis for treatment or other patient management decisions. A negative result may occur with  improper specimen collection/handling, submission of specimen other than nasopharyngeal swab, presence of viral mutation(s) within the areas targeted by this assay, and inadequate number of viral copies(<138 copies/mL). A negative result must be combined with clinical observations, patient history, and epidemiological information. The expected result is Negative.  Fact Sheet for Patients:  EntrepreneurPulse.com.au  Fact Sheet for Healthcare Providers:  IncredibleEmployment.be  This test is no t yet approved or cleared by the [place] FDA and  has been authorized for detection and/or diagnosis of SARS-CoV-2 by FDA under an Emergency Use Authorization (EUA). This EUA will remain  in effect (meaning this test can be used) for the duration of the COVID-19 declaration under Section 564(b)(1) of the Act, 21 U.S.C.section 360bbb-3(b)(1), unless the authorization is terminated  or revoked sooner.       Influenza A by PCR NEGATIVE NEGATIVE Final   Influenza B by PCR NEGATIVE NEGATIVE Final    Comment: (NOTE) The Xpert Xpress SARS-CoV-2/FLU/RSV plus assay is intended as an aid in the diagnosis of influenza from Nasopharyngeal swab specimens and should not be used as a sole basis for treatment. Nasal washings  and aspirates are unacceptable for Xpert Xpress SARS-CoV-2/FLU/RSV testing.  Fact Sheet for Patients: EntrepreneurPulse.com.au  Fact Sheet for Healthcare Providers: IncredibleEmployment.be  This test is not yet approved or cleared by the [place] FDA and has been authorized for detection and/or diagnosis of SARS-CoV-2 by FDA under an Emergency Use Authorization (EUA). This EUA will remain in effect (meaning this test can be used) for the duration of the COVID-19 declaration under Section 564(b)(1) of the Act, 21 U.S.C. section 360bbb-3(b)(1), unless the authorization is  terminated or revoked.  Performed at [facility], [address] [place], [place] [address]   MRSA Next Gen by PCR, Nasal     Status: None   Collection Time: 01/30/21  6:47 PM   Specimen: Nasal Mucosa; Nasal Swab  Result Value Ref Range Status   MRSA by PCR Next Gen NOT DETECTED NOT DETECTED Final    Comment: (NOTE) The GeneXpert MRSA Assay (FDA approved for NASAL specimens only), is one component of a comprehensive MRSA colonization surveillance program. It is not intended to diagnose MRSA infection nor to guide or monitor treatment for MRSA infections. Test performance is not FDA approved in patients less than 78 years old. Performed at [facility], [place] [address], [place], [place] [address]   Culture, blood (routine x 2)     Status: None (Preliminary result)   Collection Time: 01/31/21  1:03 AM   Specimen: BLOOD LEFT WRIST  Result Value Ref Range Status   Specimen Description   Final    BLOOD LEFT WRIST Performed at [facility] [address], [place], [place] [address]    Special Requests   Final    BOTTLES DRAWN AEROBIC ONLY Blood Culture adequate volume Performed at [facility] [address], [place], [place] [address]    Culture   Final    NO GROWTH 2 DAYS Performed at [facility] [address], [place], [place] [address]    Report Status PENDING  Incomplete  Culture, blood (routine x 2)     Status: None (Preliminary result)   Collection Time: 01/31/21  1:03 AM   Specimen: BLOOD RIGHT HAND  Result Value Ref Range Status   Specimen Description   Final    BLOOD RIGHT HAND Performed at [facility], [place] [address], [place], [place] [address]    Special Requests   Final    BOTTLES DRAWN AEROBIC ONLY Blood Culture adequate volume Performed at [facility] [address], [place], [place] [address]    Culture   Final    NO GROWTH 2 DAYS Performed at [facility] [address] [place], [place] [address]    Report Status PENDING  Incomplete     Labs: BNP (last 3 results) No results for input(s): BNP in the last 8760 hours. Basic Metabolic Panel: Recent Labs  Lab 01/30/21 0919 01/30/21 1458 01/30/21 2016 01/31/21 0103 01/31/21 0818 02/01/21 0313 02/01/21 1517  NA 135 128* 130* 135  --  137  --   K 2.6* 2.1* 2.7* 2.9*  --  2.8* 3.9  CL 86* 85* 90* 100  --  106  --   CO2 35* 32 32 26  --  24  --   GLUCOSE 95 111* 109* 107*  --  85  --   BUN 26* 28* 28* 24*  --  21  --   CREATININE 2.40* 2.39* 2.43* 2.08*  --  1.78*  --   CALCIUM 9.2 8.8* 7.7* 7.9*  --  7.7*  --   MG 2.2  --  2.0  --   --  2.0  --   PHOS  --   --   --   --  2.9 3.8  --    Liver Function Tests: Recent Labs  Lab 01/30/21 0919 01/30/21 1458 01/31/21 0103 02/01/21 0313  AST 339* 360* 395* 235*  ALT 270* 277* 293* 204*  ALKPHOS  --  73  62 46  BILITOT 0.5 0.5 0.5 0.3  PROT 6.2 5.9* 5.7* 4.8*  ALBUMIN  --  3.8 3.2* 2.5*   No results for input(s): LIPASE, AMYLASE in the last 168 hours. No results for input(s): AMMONIA in the last 168 hours. CBC: Recent Labs  Lab 01/30/21 0919 01/30/21 1458 02/01/21 0313  WBC 5.8 7.6 5.4  NEUTROABS 3,526 5.1  --   HGB 12.9 11.8* 10.2*  HCT 38.7 35.2* 30.7*  MCV 95.8 92.6 95.9  PLT 170 172 123*    Cardiac Enzymes: No results for input(s): CKTOTAL, CKMB, CKMBINDEX, TROPONINI in the last 168 hours. BNP: Invalid input(s): POCBNP CBG: No results for input(s): GLUCAP in the last 168 hours. D-Dimer No results for input(s): DDIMER in the last 72 hours. Hgb A1c No results for input(s): HGBA1C in the last 72 hours. Lipid Profile No results for input(s): CHOL, HDL, LDLCALC, TRIG, CHOLHDL, LDLDIRECT in the last 72 hours. Thyroid function studies No results for input(s): TSH, T4TOTAL, T3FREE, THYROIDAB in the last 72 hours.  Invalid input(s): FREET3 Anemia work up No results for input(s): VITAMINB12, FOLATE, FERRITIN, TIBC, IRON, RETICCTPCT in the last 72 hours. Urinalysis    Component Value Date/Time   COLORURINE YELLOW 09/29/2020 1440   APPEARANCEUR CLOUDY (A) 09/29/2020 1440   LABSPEC 1.007 09/29/2020 1440   PHURINE 5.5 09/29/2020 1440   GLUCOSEU NEGATIVE 09/29/2020 1440   HGBUR 2+ (A) 09/29/2020 1440   BILIRUBINUR NEGATIVE 12/20/2015 0956   KETONESUR NEGATIVE 09/29/2020 1440   PROTEINUR TRACE (A) 09/29/2020 1440   NITRITE NEGATIVE 09/29/2020 1440   LEUKOCYTESUR NEGATIVE 09/29/2020 1440   Sepsis Labs Invalid input(s): PROCALCITONIN,  WBC,  LACTICIDVEN Microbiology Recent Results (from the past 240 hour(s))  Resp Panel by RT-PCR (Flu A&B, Covid) Nasopharyngeal Swab     Status: None   Collection Time: 01/30/21  4:14 PM   Specimen: Nasopharyngeal Swab; Nasopharyngeal(NP) swabs in vial transport medium  Result Value Ref Range Status   SARS Coronavirus 2 by RT PCR NEGATIVE NEGATIVE Final    Comment: (NOTE) SARS-CoV-2 target nucleic acids are NOT DETECTED.  The SARS-CoV-2 RNA is generally detectable in upper respiratory specimens during the acute phase of infection. The lowest concentration of SARS-CoV-2 viral copies this assay can detect is 138 copies/mL. A negative result does not preclude SARS-Cov-2 infection and should not be used as the sole basis for treatment  or other patient management decisions. A negative result may occur with  improper specimen collection/handling, submission of specimen other than nasopharyngeal swab, presence of viral mutation(s) within the areas targeted by this assay, and inadequate number of viral copies(<138 copies/mL). A negative result must be combined with clinical observations, patient history, and epidemiological information. The expected result is Negative.  Fact Sheet for Patients:  EntrepreneurPulse.com.au  Fact Sheet for Healthcare Providers:  IncredibleEmployment.be  This test is no t yet approved or cleared by the [place] FDA and  has been authorized for detection and/or diagnosis of SARS-CoV-2 by FDA under an Emergency Use Authorization (EUA). This EUA will remain  in effect (meaning this test can be used) for the duration of the COVID-19 declaration under Section 564(b)(1) of the Act, 21 U.S.C.section 360bbb-3(b)(1), unless the authorization is terminated  or revoked sooner.       Influenza A by PCR NEGATIVE NEGATIVE Final   Influenza B by PCR NEGATIVE NEGATIVE Final    Comment: (NOTE) The Xpert Xpress SARS-CoV-2/FLU/RSV plus assay is intended as an aid in the diagnosis of influenza from Nasopharyngeal  swab specimens and should not be used as a sole basis for treatment. Nasal washings and aspirates are unacceptable for Xpert Xpress SARS-CoV-2/FLU/RSV testing.  Fact Sheet for Patients: EntrepreneurPulse.com.au  Fact Sheet for Healthcare Providers: IncredibleEmployment.be  This test is not yet approved or cleared by the [place] FDA and has been authorized for detection and/or diagnosis of SARS-CoV-2 by FDA under an Emergency Use Authorization (EUA). This EUA will remain in effect (meaning this test can be used) for the duration of the COVID-19 declaration under Section 564(b)(1) of the Act, 21 U.S.C. section  360bbb-3(b)(1), unless the authorization is terminated or revoked.  Performed at [facility], [address] [place], [place] [address]   MRSA Next Gen by PCR, Nasal     Status: None   Collection Time: 01/30/21  6:47 PM   Specimen: Nasal Mucosa; Nasal Swab  Result Value Ref Range Status   MRSA by PCR Next Gen NOT DETECTED NOT DETECTED Final    Comment: (NOTE) The GeneXpert MRSA Assay (FDA approved for NASAL specimens only), is one component of a comprehensive MRSA colonization surveillance program. It is not intended to diagnose MRSA infection nor to guide or monitor treatment for MRSA infections. Test performance is not FDA approved in patients less than 50 years old. Performed at [facility], [place] [address] [place], [place] [address]   Culture, blood (routine x 2)     Status: None (Preliminary result)   Collection Time: 01/31/21  1:03 AM   Specimen: BLOOD LEFT WRIST  Result Value Ref Range Status   Specimen Description   Final    BLOOD LEFT WRIST Performed at [facility] [address], [place], [place] [address]    Special Requests   Final    BOTTLES DRAWN AEROBIC ONLY Blood Culture adequate volume Performed at [facility] [address] [place], [place] [address]    Culture   Final    NO GROWTH 2 DAYS Performed at [facility] [address] [place], [place] [address]    Report Status PENDING  Incomplete  Culture, blood (routine x 2)     Status: None (Preliminary result)   Collection Time: 01/31/21  1:03 AM   Specimen: BLOOD RIGHT HAND  Result Value Ref Range Status   Specimen Description   Final    BLOOD RIGHT HAND Performed at [facility], [place] [address] [place], [place] [address]    Special Requests   Final    BOTTLES DRAWN AEROBIC ONLY Blood Culture adequate volume Performed at [facility] [address] [place], [place] [address]    Culture   Final     NO GROWTH 2 DAYS Performed at [facility] [address], [place],  [address]    Report Status PENDING  Incomplete     Time coordinating discharge:  I have spent 35 minutes face to face with the patient and on the ward discussing the patients care, assessment, plan and disposition with other care givers. >50% of the time was devoted counseling the patient about the risks and benefits of treatment/Discharge disposition and coordinating care.   SIGNED:   Damita Lack, MD  [facility] 02/02/2021, 1:35 PM   If 7PM-7AM, please contact night-coverage

## 2021-02-02 NOTE — Telephone Encounter (Signed)
Called patient on 02/02/2021 , 2:11 PM in an attempt to reach the patient for a hospital follow up.   Admit date: 01/30/21 Discharge: 02/01/21   She does not have any questions or concerns about medications from the hospital admission. The patient's medications were reviewed over the phone, they were counseled to bring in all current medications to the hospital follow up visit.   I advised the patient to call if any questions or concerns arise about the hospital admission or medications    Home health was not started in the hospital.  All questions were answered and a follow up appointment was made.   Prior to Admission medications   Medication Sig Start Date End Date Taking? Authorizing Provider  clopidogrel (PLAVIX) 75 MG tablet Take  1 tablet  Daily  to Prevent  Blood Clots Patient taking differently: Take 75 mg by mouth in the morning. 08/17/20   [name], MD  ezetimibe (ZETIA) 10 MG tablet TAKE 1 TABLET(10 MG) BY MOUTH DAILY Patient taking differently: Take 10 mg by mouth in the morning. 09/07/20   Magda Bernheim, NP  levETIRAcetam (KEPPRA) 500 MG tablet TAKE 1 TABLET BY MOUTH TWICE DAILY TO PREVENT SEIZURES Patient taking differently: 500 mg See admin instructions. Take 500 mg by mouth at 7 AM and 7 PM 09/07/20   Magda Bernheim, NP  levothyroxine (SYNTHROID) 75 MCG tablet Take 1 tablet (75 mcg total) by mouth daily in the afternoon. 02/02/21 03/04/21  Amin, Jeanella Flattery, MD  metoprolol succinate (TOPROL-XL) 25 MG 24 hr tablet Take 1 tablet (25 mg total) by mouth daily. 02/02/21 03/04/21  Amin, Jeanella Flattery, MD  rosuvastatin (CRESTOR) 40 MG tablet TAKE 1 TABLET BY MOUTH  DAILY FOR CHOLESTEROL Patient taking differently: Take 40 mg by mouth in the morning. 05/02/20   Liane Comber, NP

## 2021-02-05 LAB — CULTURE, BLOOD (ROUTINE X 2)
Culture: NO GROWTH
Culture: NO GROWTH
Special Requests: ADEQUATE
Special Requests: ADEQUATE

## 2021-02-06 NOTE — Progress Notes (Signed)
Future Appointments  Date Time Provider Department  02/07/2021  3:00 PM [name], MD [facility]  03/03/2021  8:00 AM Minus Breeding, MD [facility]  03/15/2021  2:45 PM Alric Ran, MD [facility]  04/12/2021  2:45 PM Belinda Block, CMA [facility]  04/12/2021  3:30 PM Sater, Nanine Means, MD [facility]  05/04/2021  2:00 PM Liane Comber, NP [facility] Follow-Up     This very nice 60 y.o. MWF  was admitted to the hospital on  01/30/2021  and patient was discharged from the hospital on   02/01/2021. The patient now presents 6 days post discharge for follow up for transition from recent hospitalization. The day after discharge  our clinical staff contacted the patient to assure stability and schedule a follow up appointment. The discharge summary, medications and diagnostic test results were reviewed before meeting with the patient. The patient was admitted for:   Orthostatic hypotension Hypokalemia Hyponatremia Transaminitis Hypothyroidism Hyperlipidemia, mixed Coronary artery disease involving native coronary artery of native heart without angina pectoris CKD (chronic kidney disease) stage 4, GFR 15-29 ml/min (HCC) Seizure disorder (HCC)      On the day of hospitalization, the patient presented to the office hypotensive  BP 82/54 & stat labs found a very low K = 2.6 and patient was referred to the hospital for IVF  & ivf/po K supplementation.  Troponins were elevated over 5,000 suspect for NSTEMI, but with no CP or EKG changes it was felt the elevated troponin represented Demand ischemia from her hypotension. As her kidney functions were in Stage 4, it was decided not to do a heart cath which she declined anyway. In hindsight , the etiology for her Low K & Na and Hypotension is apparent in that she had been taking Lasix which was not on her med lists. Today she requests refill because of dependent swelling of L>R LE.      Hospitalization discharge instructions and medications  are reconciled with the patient.      Patient is also followed with Hypertension, Hyperlipidemia, Pre-Diabetes and Vitamin D Deficiency.      Patient is treated for HTN & BP has been controlled at home. Today's BP was borderline elevated at 143/70 by Nurse and by recheck was normal .  Rechecked at 119/69  P 52  sitting   and Standing 102/55  P 57 . Patient has had no complaints of any cardiac type chest pain, palpitations, dyspnea /orthopnea/PND, dizziness, claudication or dependent edema.     Hyperlipidemia is controlled with diet & meds. Patient denies myalgias or other med SE's. Last Lipids were at goal :  Lab Results  Component Value Date   CHOL 94 01/30/2021   HDL 35 (L) 01/30/2021   LDLCALC 40 01/30/2021   TRIG 107 01/30/2021   CHOLHDL 2.7 01/30/2021      Also, the patient has history of PreDiabetes and has had no symptoms of reactive hypoglycemia, diabetic polys, paresthesias or visual blurring.  Last A1c was at goal :  Lab Results  Component Value Date   HGBA1C 5.5 01/30/2021      Further, the patient also has history of Vitamin D Deficiency and supplements vitamin D without any suspected side-effects. Last vitamin D was  slightly elevated & dose was tapered.   Lab Results  Component Value Date   VD25OH 114 (H) 01/30/2021   Current Outpatient Medications on File Prior to Visit  Medication Sig   clopidogrel ( 75 MG tablet Take  1 tablet  Daily     ezetimibe ( 10 MG tablet TAKE 1 TABLET DAILY    levETIRAcetam (KEPPRA) 500 MG tablet TAKE 1 TABLET  TWICE DAILY    levothyroxine 75 MCG tablet Take 1 tablet  daily in the afternoon.   metoprolol succ -XL 25 MG  Take 1 tablet daily.   rosuvastatin 40 MG tablet TAKE 1 TABLET DAILY     Allergies  Allergen Reactions   Ace Inhibitors Cough   Penicillins "happened years ago" (reaction not recalled)   Prednisone Dysphoria   PMHx:   Past Medical History:  Diagnosis Date   Aneurysm ([facility])    CAD (coronary artery disease) 2002    hx CABG 2002, Cath 2011 w/ occluded native vessels but LIMA-LAD, SVG-pLAD, SVG-Diag, SVG-OM patent, SVG-RCA occluded   CKD (chronic kidney disease) stage 3, GFR 30-59 ml/min (HCC)    History of basal cell carcinoma excision 12/20/2017   Hyperlipidemia LDL goal <70    Hypertension    Hypothyroidism    Tobacco abuse    Vitamin D deficiency    Immunization History  Administered Date(s) Administered   Influenza Inj Mdck Quad With Preservative 01/31/2017, 12/23/2017, 01/05/2020   Influenza Split 12/02/2014   Influenza,inj,quad, With Preservative 12/20/2015   PFIZER(Purple Top)SARS-COV-2 Vaccination 07/13/2019, 08/07/2019, 04/06/2020   PPD Test 10/09/2013, 12/02/2014   Td 10/24/2006, 01/31/2017   Past Surgical History:  Procedure Laterality Date   CATARACT EXTRACTION, BILATERAL Bilateral 2020   CEREBRAL ANEURYSM REPAIR Right 2012   CEREBRAL ANEURYSM REPAIR Right 2005   CORONARY ARTERY BYPASS GRAFT  2002   Dr Cyndia Bent   EMBOLIZATION  2005   Embolization of right PICA aneurysm   FHx:    Reviewed / unchanged  SHx:    Reviewed / unchanged  Systems Review:  Constitutional: Denies fever, chills, wt changes, headaches, insomnia, fatigue, night sweats, change in appetite. Eyes: Denies redness, blurred vision, diplopia, discharge, itchy, watery eyes.  ENT: Denies discharge, congestion, post nasal drip, epistaxis, sore throat, earache, hearing loss, dental pain, tinnitus, vertigo, sinus pain, snoring.  CV: Denies chest pain, palpitations, irregular heartbeat, syncope, dyspnea, diaphoresis, orthopnea, PND, claudication or edema. Respiratory: denies cough, dyspnea, DOE, pleurisy, hoarseness, laryngitis, wheezing.  Gastrointestinal: Denies dysphagia, odynophagia, heartburn, reflux, water brash, abdominal pain or cramps, nausea, vomiting, bloating, diarrhea, constipation, hematemesis, melena, hematochezia  or hemorrhoids. Genitourinary: Denies dysuria, frequency, urgency, nocturia, hesitancy,  discharge, hematuria or flank pain. Musculoskeletal: Denies arthralgias, myalgias, stiffness, jt. swelling, pain, limping or strain/sprain.  Skin: Denies pruritus, rash, hives, warts, acne, eczema or change in skin lesion(s). Neuro: No weakness, tremor, incoordination, spasms, paresthesia or pain. Psychiatric: Denies confusion, memory loss or sensory loss. Endo: Denies change in weight, skin or hair change.  Heme/Lymph: No excessive bleeding, bruising or enlarged lymph nodes.  Physical Exam  BP (!) 143/70   Pulse (!) 59   Temp 97.9 F (36.6 C)   Resp 16   Ht 5\' 7"  (1.702 m)   Wt 171 lb 6.4 oz (77.7 kg)   SpO2 97%   BMI 26.85 kg/m   Appears well nourished, well groomed  and in no distress.  Eyes: PERRLA, EOMs, conjunctiva no swelling or erythema. Sinuses: No frontal/maxillary tenderness ENT/Mouth: EAC's clear, TM's nl w/o erythema, bulging. Nares clear w/o erythema, swelling, exudates. Oropharynx clear without erythema or exudates. Oral hygiene is good. Tongue normal, non obstructing. Hearing intact.  Neck: Supple. Thyroid nl. Car 2+/2+ without bruits, nodes or JVD. Chest: Respirations nl with BS clear & equal w/o rales, rhonchi,  wheezing or stridor.  Cor: Heart sounds normal w/ regular rate and rhythm without sig. murmurs, gallops, clicks or rubs. Peripheral pulses normal and equal  without edema.  Abdomen: Soft & bowel sounds normal. Non-tender w/o guarding, rebound, hernias, masses or organomegaly.  Lymphatics: Unremarkable.  Musculoskeletal: Full ROM all peripheral extremities, joint stability, 5/5 strength and normal gait.  Skin: Warm, dry without exposed rashes, lesions or ecchymosis apparent.  Neuro: Cranial nerves intact, reflexes equal bilaterally. Sensory-motor testing grossly intact. Tendon reflexes grossly intact.  Pysch: Alert & oriented x 3.  Insight and judgement nl & appropriate. No ideations.  Assessment and Plan:   1. Orthostatic hypotension  - Continue  medication, monitor blood pressure at home.  - Continue DASH diet.  Reminder to go to the ER if any CP,  SOB, nausea, dizziness, severe HA, changes vision/speech.    - CBC with Differential/Platelet - COMPLETE METABOLIC PANEL WITH GFR  2. Hypokalemia  - COMPLETE METABOLIC PANEL WITH GFR  3. Hyponatremia  - COMPLETE METABOLIC PANEL WITH GFR  4. Transaminitis  - COMPLETE METABOLIC PANEL WITH GFR  5. Hypothyroidism  - TSH  6. Hyperlipidemia, mixed  - Continue diet/meds, exercise,& lifestyle modifications.  - Continue monitor periodic cholesterol/liver & renal functions    7. Coronary artery disease involving native coronary  artery of native heart without angina pectoris  - CBC with Differential/Platelet - COMPLETE METABOLIC PANEL WITH GFR  8. CKD (chronic kidney disease) stage 4, GFR 15-29 ml/min (HCC)  - COMPLETE METABOLIC PANEL WITH GFR  9. Seizure disorder ([place])   10. Medication management  - CBC with Differential/Platelet - COMPLETE METABOLIC PANEL WITH GFR - TSH  11. Cigarette nicotine dependence with nicotine-induced disorder  - varenicline (CHANTIX CONTINUING MONTH PAK) 1 MG tablet; Take  1/2 to 1 tablet  2 x /day  for Smoking Cessation  Dispense: 60 tablet; Refill: 3  12. Hypertension, essential  - triamterene-hydrochlorothiazide (DYAZIDE) 37.5-25 MG capsule; Take  1 capsule  Daily  for BP & Fluid Retention  Dispense: 90 capsule; Refill: 3       Discussed  regular exercise, BP monitoring, weight control to achieve/maintain BMI less than 25 and discussed meds and SE's. Recommended labs to assess and monitor clinical status with further disposition pending results of labs. Over 30 minutes of exam, counseling, chart review was performed.   Kirtland Bouchard, MD

## 2021-02-07 ENCOUNTER — Other Ambulatory Visit: Payer: Self-pay

## 2021-02-07 ENCOUNTER — Ambulatory Visit (INDEPENDENT_AMBULATORY_CARE_PROVIDER_SITE_OTHER): Payer: 59 | Admitting: Internal Medicine

## 2021-02-07 ENCOUNTER — Encounter: Payer: Self-pay | Admitting: Internal Medicine

## 2021-02-07 VITALS — BP 143/70 | HR 59 | Temp 97.9°F | Resp 16 | Ht 67.0 in | Wt 171.4 lb

## 2021-02-07 DIAGNOSIS — N184 Chronic kidney disease, stage 4 (severe): Secondary | ICD-10-CM

## 2021-02-07 DIAGNOSIS — I951 Orthostatic hypotension: Secondary | ICD-10-CM | POA: Diagnosis not present

## 2021-02-07 DIAGNOSIS — G40909 Epilepsy, unspecified, not intractable, without status epilepticus: Secondary | ICD-10-CM

## 2021-02-07 DIAGNOSIS — F17219 Nicotine dependence, cigarettes, with unspecified nicotine-induced disorders: Secondary | ICD-10-CM

## 2021-02-07 DIAGNOSIS — Z79899 Other long term (current) drug therapy: Secondary | ICD-10-CM

## 2021-02-07 DIAGNOSIS — E871 Hypo-osmolality and hyponatremia: Secondary | ICD-10-CM | POA: Diagnosis not present

## 2021-02-07 DIAGNOSIS — E782 Mixed hyperlipidemia: Secondary | ICD-10-CM

## 2021-02-07 DIAGNOSIS — E039 Hypothyroidism, unspecified: Secondary | ICD-10-CM

## 2021-02-07 DIAGNOSIS — I2510 Atherosclerotic heart disease of native coronary artery without angina pectoris: Secondary | ICD-10-CM

## 2021-02-07 DIAGNOSIS — R7401 Elevation of levels of liver transaminase levels: Secondary | ICD-10-CM | POA: Diagnosis not present

## 2021-02-07 DIAGNOSIS — E876 Hypokalemia: Secondary | ICD-10-CM | POA: Diagnosis not present

## 2021-02-07 DIAGNOSIS — I10 Essential (primary) hypertension: Secondary | ICD-10-CM

## 2021-02-07 MED ORDER — VARENICLINE TARTRATE 1 MG PO TABS: ORAL_TABLET | ORAL | 3 refills | Status: DC

## 2021-02-07 MED ORDER — TRIAMTERENE-HCTZ 37.5-25 MG PO CAPS: ORAL_CAPSULE | ORAL | 3 refills | Status: DC

## 2021-02-08 ENCOUNTER — Other Ambulatory Visit: Payer: Self-pay | Admitting: Internal Medicine

## 2021-02-08 DIAGNOSIS — I10 Essential (primary) hypertension: Secondary | ICD-10-CM

## 2021-02-08 DIAGNOSIS — E039 Hypothyroidism, unspecified: Secondary | ICD-10-CM

## 2021-02-08 DIAGNOSIS — Z79899 Other long term (current) drug therapy: Secondary | ICD-10-CM

## 2021-02-08 DIAGNOSIS — R7989 Other specified abnormal findings of blood chemistry: Secondary | ICD-10-CM

## 2021-02-08 LAB — COMPLETE METABOLIC PANEL WITH GFR
AG Ratio: 1.6 (calc) (ref 1.0–2.5)
ALT: 119 U/L — ABNORMAL HIGH (ref 6–29)
AST: 112 U/L — ABNORMAL HIGH (ref 10–35)
Albumin: 3.6 g/dL (ref 3.6–5.1)
Alkaline phosphatase (APISO): 65 U/L (ref 37–153)
BUN/Creatinine Ratio: 11 (calc) (ref 6–22)
BUN: 20 mg/dL (ref 7–25)
CO2: 31 mmol/L (ref 20–32)
Calcium: 8.8 mg/dL (ref 8.6–10.4)
Chloride: 101 mmol/L (ref 98–110)
Creat: 1.88 mg/dL — ABNORMAL HIGH (ref 0.50–1.05)
Globulin: 2.3 g/dL (calc) (ref 1.9–3.7)
Glucose, Bld: 84 mg/dL (ref 65–99)
Potassium: 4.3 mmol/L (ref 3.5–5.3)
Sodium: 141 mmol/L (ref 135–146)
Total Bilirubin: 0.4 mg/dL (ref 0.2–1.2)
Total Protein: 5.9 g/dL — ABNORMAL LOW (ref 6.1–8.1)
eGFR: 30 mL/min/{1.73_m2} — ABNORMAL LOW (ref >=60)

## 2021-02-08 LAB — CBC WITH DIFFERENTIAL/PLATELET
Absolute Monocytes: 536 cells/uL (ref 200–950)
Basophils Absolute: 57 cells/uL (ref 0–200)
Basophils Relative: 1.1 %
Eosinophils Absolute: 208 cells/uL (ref 15–500)
Eosinophils Relative: 4 %
HCT: 34.8 % — ABNORMAL LOW (ref 35.0–45.0)
Hemoglobin: 11.3 g/dL — ABNORMAL LOW (ref 11.7–15.5)
Lymphs Abs: 1695 cells/uL (ref 850–3900)
MCH: 31.7 pg (ref 27.0–33.0)
MCHC: 32.5 g/dL (ref 32.0–36.0)
MCV: 97.8 fL (ref 80.0–100.0)
MPV: 11 fL (ref 7.5–12.5)
Monocytes Relative: 10.3 %
Neutro Abs: 2704 cells/uL (ref 1500–7800)
Neutrophils Relative %: 52 %
Platelets: 180 10*3/uL (ref 140–400)
RBC: 3.56 10*6/uL — ABNORMAL LOW (ref 3.80–5.10)
RDW: 12.6 % (ref 11.0–15.0)
Total Lymphocyte: 32.6 %
WBC: 5.2 10*3/uL (ref 3.8–10.8)

## 2021-02-08 LAB — TSH: TSH: 5.38 mIU/L — ABNORMAL HIGH (ref 0.40–4.50)

## 2021-02-08 MED ORDER — LEVOTHYROXINE SODIUM 88 MCG PO TABS: ORAL_TABLET | ORAL | 0 refills | Status: DC

## 2021-02-08 NOTE — Progress Notes (Signed)
============================================================ °-   Test results slightly outside the reference range are not unusual. If there is anything important, I will review this with you,  otherwise it is considered normal test values.  If you have further questions,  please do not hesitate to contact me at the office or via My Chart.  ============================================================ ============================================================  -  CBC shows RBC is better - Almost back to Normal  ============================================================ ============================================================  - Kidney functions still look a little dehydrated    - GFR = 30  (Stage 3b) but Almost Stage 4  (15-29)      Very important to drink adequate amounts of fluids                                                                                to prevent permanent damage    - Recommend drink at least                                            6 bottles (16 ounces) of fluids /water /day                                                                                                     = 96 Oz ~100 oz  - 100 oz = 3,000 cc or 3 liters / day  - >> That's 1 &1/2 bottles of                                                                            a 2 liter soda bottle /day !   ============================================================ ============================================================  -  TSH still elevated which means thyroid still low in blood,                                                  so increasing dose from 75 up to 88 mcg tabs    - New Rx for Levothyroxine 88 mcg daily sent to Drug store ============================================================ ============================================================  -  Liver enzymes are improving - Avoid Alcohol & Tylenol   ============================================================ ============================================================  -  Need nurse visit in 1 month to recheck Thyroid & CMET

## 2021-02-13 ENCOUNTER — Telehealth: Payer: Self-pay

## 2021-02-13 NOTE — Telephone Encounter (Signed)
-----   Message from Darreld Mclean, [place] sent at 02/02/2021  4:52 PM EST ----- Regarding: HOCM Patient - Needs Appt with Dr. Madilyn Fireman,  Dr. Debara Pickett and I saw this patient in the hospital earlier this week. She was diagnosed with HOCM. Dr. Debara Pickett would like her to see Dr. Gasper Sells. I talked with him and he said he prefers seeing new HOCM patients on one of his reader days since he has to go over a lot of things and might start her on the new medication. I tried to look for one of his reader days but I didn't find anything until I think February. Is there anyway you can help schedule her an appointment?  Thank you so much! Callie

## 2021-02-13 NOTE — Telephone Encounter (Signed)
Called to offer pt an appointment with Dr. Gasper Sells.  Left a message to call back.

## 2021-03-02 NOTE — Progress Notes (Signed)
Cardiology Office Note   Date:  03/03/2021   ID:  Olivia Walsh, DOB 1960-06-03, MRN [id number]  PCP:  [name], MD  Cardiologist:   Minus Breeding, MD   Chief Complaint  Patient presents with   Cardiomyopathy      History of Present Illness: Olivia Walsh is a 61 y.o. female who presents for follow up of CAD s/p CABG at age 51, HTN, HLD, tobacco abuse, hypothyroidism and cerebral aneurysms with prior coiling and subarachnoid hemorrhage 2005, PVD with bilateral iliac stents 2011(per [facility] from [facility], pt denies) and seizure disorder.   She was in the hospital recently with hypotension.  She required Levophed.  She ruled in with elevated cardiac enzymes.  She had an echo which demonstrated severe concentric LVH.  She had an outflow gradient of 102.  There was moderate MR with SAM.   She did not have a cath with AKI.   Toprol was started and labetalol and losartan were discontinued.    Since getting out of the hospital she has done well.  She denies any cardiovascular symptoms.  She has not had any presyncope or syncope.  She has not had any palpitations.  She denies any chest pressure, neck or arm discomfort.  She has had no shortness of breath, PND or orthopnea.  She has had no weight gain or edema.  She is relatively sedentary with a desk job   Past Medical History:  Diagnosis Date   Aneurysm ([place])    CAD (coronary artery disease) 2002   hx CABG 2002, Cath 2011 w/ occluded native vessels but LIMA-LAD, SVG-pLAD, SVG-Diag, SVG-OM patent, SVG-RCA occluded   CKD (chronic kidney disease) stage 3, GFR 30-59 ml/min (HCC)    History of basal cell carcinoma excision 12/20/2017   Hyperlipidemia LDL goal <70    Hypertension    Hypothyroidism    Tobacco abuse    Vitamin D deficiency     Past Surgical History:  Procedure Laterality Date   CATARACT EXTRACTION, BILATERAL Bilateral 2020   CEREBRAL ANEURYSM REPAIR Right 2012   CEREBRAL ANEURYSM REPAIR Right 2005    CORONARY ARTERY BYPASS GRAFT  2002   Dr Cyndia Bent   EMBOLIZATION  2005   Embolization of right PICA aneurysm     Current Outpatient Medications  Medication Sig Dispense Refill   clopidogrel (PLAVIX) 75 MG tablet Take  1 tablet  Daily  to Prevent  Blood Clots (Patient taking differently: Take 75 mg by mouth in the morning.) 90 tablet 3   ezetimibe (ZETIA) 10 MG tablet TAKE 1 TABLET(10 MG) BY MOUTH DAILY (Patient taking differently: Take 10 mg by mouth in the morning.) 90 tablet 3   levETIRAcetam (KEPPRA) 500 MG tablet TAKE 1 TABLET BY MOUTH TWICE DAILY TO PREVENT SEIZURES (Patient taking differently: 500 mg See admin instructions. Take 500 mg by mouth at 7 AM and 7 PM) 180 tablet 3   levothyroxine (SYNTHROID) 88 MCG tablet Take  1 tablet  Daily  on an empty stomach with only water for 30 minutes & no Antacid meds, Calcium or Magnesium for 4 hours & avoid Biotin 90 tablet 0   metoprolol succinate (TOPROL-XL) 25 MG 24 hr tablet Take 1 tablet (25 mg total) by mouth daily. 30 tablet 0   rosuvastatin (CRESTOR) 40 MG tablet TAKE 1 TABLET BY MOUTH  DAILY FOR CHOLESTEROL (Patient taking differently: Take 40 mg by mouth in the morning.) 90 tablet 3   triamterene-hydrochlorothiazide (DYAZIDE) 37.5-25 MG capsule Take  1 capsule  Daily  for BP & Fluid Retention 90 capsule 3   varenicline (CHANTIX CONTINUING MONTH PAK) 1 MG tablet Take  1/2 to 1 tablet  2 x /day  for Smoking Cessation 60 tablet 3   No current facility-administered medications for this visit.    Allergies:   Ace inhibitors, Penicillins, and Prednisone     ROS:  Please see the history of present illness.   Otherwise, review of systems are positive for none.   All other systems are reviewed and negative.    PHYSICAL EXAM: VS:  BP 108/64 (BP Location: Left Arm, Patient Position: Sitting, Cuff Size: Normal)    Pulse 68    Resp 20    Ht 5\' 7"  (1.702 m)    Wt 262 lb 6.4 oz (119 kg)    SpO2 98%    BMI 41.10 kg/m  , BMI Body mass index is 41.1  kg/m. GENERAL:  Well appearing NECK:  No jugular venous distention, waveform within normal limits, carotid upstroke brisk and symmetric, no bruits, no thyromegaly LUNGS:  Clear to auscultation bilaterally CHEST:  Unremarkable HEART:  PMI not displaced or sustained,S1 and S2 within normal limits, no S3, no S4, no clicks, no rubs, 3 out of 6 apical systolic murmur increasing with the strain phase of Valsalva, no diastolic murmurs ABD:  Flat, positive bowel sounds normal in frequency in pitch, no bruits, no rebound, no guarding, no midline pulsatile mass, no hepatomegaly, no splenomegaly EXT:  2 plus pulses throughout, no edema, no cyanosis no clubbing   EKG:  EKG is not ordered today.   Recent Labs: 02/01/2021: Magnesium 2.0 02/07/2021: ALT 119; BUN 20; Creat 1.88; Hemoglobin 11.3; Platelets 180; Potassium 4.3; Sodium 141; TSH 5.38    Lipid Panel    Component Value Date/Time   CHOL 94 01/30/2021 0919   TRIG 107 01/30/2021 0919   HDL 35 (L) 01/30/2021 0919   CHOLHDL 2.7 01/30/2021 0919   VLDL 30 09/26/2016 1505   LDLCALC 40 01/30/2021 0919      Wt Readings from Last 3 Encounters:  03/03/21 262 lb 6.4 oz (119 kg)  02/07/21 171 lb 6.4 oz (77.7 kg)  01/31/21 162 lb 7.7 oz (73.7 kg)      Other studies Reviewed: Additional studies/ records that were reviewed today include: Hospital records. Review of the above records demonstrates:  Please see elsewhere in the note.     ASSESSMENT AND PLAN:  HOCM: I had a long discussion with her about this.  I am going to get an MRI.  We talked about physiology and medical management.  I would not typically use a diuretic but she has had volume retention in the past and she is doing quite well.  We talked about stopping this if she ever gets dehydrated.  Otherwise no change in therapy.  AKI:  His creat was 2.4 on admission and 1.78 at discharge.  This will be followed closely by [name], MD  CAD: She did have elevated troponins and I  reviewed this.  She has had no chest pain.  This was in the light of other significant illness.  She has renal insufficiency and would be high risk for cardiac cath.  Therefore, I am going to manage this conservatively without further invasive imaging.  DYSLIPIDEMIA: LDL was 40 with an HDL of 35.  She will continue the meds as listed.  HTN: Her blood pressure is well controlled on the meds as listed.  Current medicines are  reviewed at length with the patient today.  The patient does not have concerns regarding medicines.  The following changes have been made:  no change  Labs/ tests ordered today include:   Orders Placed This Encounter  Procedures   MR CARDIAC MORPHOLOGY W WO CONTRAST   Hemoglobin and hematocrit, blood   CBC w/Diff/Platelet     Disposition:   FU with me after the MRI.     Signed, Minus Breeding, MD  03/03/2021 9:06 AM    [place]

## 2021-03-03 ENCOUNTER — Encounter: Payer: Self-pay | Admitting: Cardiology

## 2021-03-03 ENCOUNTER — Other Ambulatory Visit: Payer: Self-pay

## 2021-03-03 ENCOUNTER — Ambulatory Visit (INDEPENDENT_AMBULATORY_CARE_PROVIDER_SITE_OTHER): Payer: 59 | Admitting: Cardiology

## 2021-03-03 VITALS — BP 108/64 | HR 68 | Resp 20 | Ht 67.0 in | Wt 262.4 lb

## 2021-03-03 DIAGNOSIS — I10 Essential (primary) hypertension: Secondary | ICD-10-CM

## 2021-03-03 DIAGNOSIS — E785 Hyperlipidemia, unspecified: Secondary | ICD-10-CM

## 2021-03-03 DIAGNOSIS — I2510 Atherosclerotic heart disease of native coronary artery without angina pectoris: Secondary | ICD-10-CM | POA: Diagnosis not present

## 2021-03-03 DIAGNOSIS — I421 Obstructive hypertrophic cardiomyopathy: Secondary | ICD-10-CM

## 2021-03-03 LAB — CBC WITH DIFFERENTIAL/PLATELET
Basophils Absolute: 0.1 10*3/uL (ref 0.0–0.2)
Basos: 1 %
EOS (ABSOLUTE): 0.2 10*3/uL (ref 0.0–0.4)
Eos: 4 %
Hematocrit: 38 % (ref 34.0–46.6)
Hemoglobin: 12.6 g/dL (ref 11.1–15.9)
Immature Grans (Abs): 0 10*3/uL (ref 0.0–0.1)
Immature Granulocytes: 0 %
Lymphocytes Absolute: 1.4 10*3/uL (ref 0.7–3.1)
Lymphs: 25 %
MCH: 30.9 pg (ref 26.6–33.0)
MCHC: 33.2 g/dL (ref 31.5–35.7)
MCV: 93 fL (ref 79–97)
Monocytes Absolute: 0.8 10*3/uL (ref 0.1–0.9)
Monocytes: 14 %
Neutrophils Absolute: 3.1 10*3/uL (ref 1.4–7.0)
Neutrophils: 56 %
Platelets: 158 10*3/uL (ref 150–450)
RBC: 4.08 x10E6/uL (ref 3.77–5.28)
RDW: 13.1 % (ref 11.7–15.4)
WBC: 5.6 10*3/uL (ref 3.4–10.8)

## 2021-03-03 NOTE — Patient Instructions (Signed)
Medication Instructions:  Continue same medications   Lab Work: CBC today   Testing/Procedures: Cardiac MRI      Follow-Up: At [facility], you and your health needs are our priority.  As part of our continuing mission to provide you with exceptional heart care, we have created designated Provider Care Teams.  These Care Teams include your primary Cardiologist (physician) and Advanced Practice Providers (APPs -  Physician Assistants and Nurse Practitioners) who all work together to provide you with the care you need, when you need it.  We recommend signing up for the patient portal called "MyChart".  Sign up information is provided on this After Visit Summary.  MyChart is used to connect with patients for Virtual Visits (Telemedicine).  Patients are able to view lab/test results, encounter notes, upcoming appointments, etc.  Non-urgent messages can be sent to your provider as well.   To learn more about what you can do with MyChart, go to [email or web address].     Your next appointment:  2 months    The format for your next appointment: Office     Provider:  Dr.Hochrein

## 2021-03-06 ENCOUNTER — Other Ambulatory Visit: Payer: Self-pay | Admitting: Internal Medicine

## 2021-03-13 ENCOUNTER — Ambulatory Visit (INDEPENDENT_AMBULATORY_CARE_PROVIDER_SITE_OTHER): Payer: 59

## 2021-03-13 ENCOUNTER — Other Ambulatory Visit: Payer: Self-pay

## 2021-03-13 DIAGNOSIS — R7989 Other specified abnormal findings of blood chemistry: Secondary | ICD-10-CM

## 2021-03-13 DIAGNOSIS — I10 Essential (primary) hypertension: Secondary | ICD-10-CM

## 2021-03-13 DIAGNOSIS — E039 Hypothyroidism, unspecified: Secondary | ICD-10-CM

## 2021-03-13 DIAGNOSIS — Z79899 Other long term (current) drug therapy: Secondary | ICD-10-CM

## 2021-03-13 LAB — COMPLETE METABOLIC PANEL WITH GFR
AG Ratio: 1.6 (calc) (ref 1.0–2.5)
ALT: 63 U/L — ABNORMAL HIGH (ref 6–29)
AST: 59 U/L — ABNORMAL HIGH (ref 10–35)
Albumin: 4 g/dL (ref 3.6–5.1)
Alkaline phosphatase (APISO): 60 U/L (ref 37–153)
BUN/Creatinine Ratio: 18 (calc) (ref 6–22)
BUN: 35 mg/dL — ABNORMAL HIGH (ref 7–25)
CO2: 34 mmol/L — ABNORMAL HIGH (ref 20–32)
Calcium: 9.6 mg/dL (ref 8.6–10.4)
Chloride: 99 mmol/L (ref 98–110)
Creat: 1.98 mg/dL — ABNORMAL HIGH (ref 0.50–1.05)
Globulin: 2.5 g/dL (calc) (ref 1.9–3.7)
Glucose, Bld: 131 mg/dL — ABNORMAL HIGH (ref 65–99)
Potassium: 4.2 mmol/L (ref 3.5–5.3)
Sodium: 140 mmol/L (ref 135–146)
Total Bilirubin: 0.4 mg/dL (ref 0.2–1.2)
Total Protein: 6.5 g/dL (ref 6.1–8.1)
eGFR: 28 mL/min/{1.73_m2} — ABNORMAL LOW (ref >=60)

## 2021-03-13 LAB — TSH: TSH: 0.05 mIU/L — ABNORMAL LOW (ref 0.40–4.50)

## 2021-03-13 NOTE — Progress Notes (Signed)
Patient presents to the office for a nurse visit to have labs done to check TSH levels and LFT's. No questions or concerns at this time. Vitals taken and recorded.

## 2021-03-14 NOTE — Progress Notes (Signed)
============================================================ °-   Test results slightly outside the reference range are not unusual. If there is anything important, I will review this with you,  otherwise it is considered normal test values.  If you have further questions,  please do not hesitate to contact me at the office or via My Chart.  ============================================================ ============================================================  -  Blood test appear even MORE Dehydrated  !   - Kidney functions still look a little dehydrated    Very important to drink adequate amounts of fluids to prevent permanent damage    - Recommend drink at least 6 bottles (16 ounces) of fluids /water /day = 96 Oz ~100 oz  - 100 oz = 3,000 cc or 3 liters / day  - >> That's 1 &1/2 bottles of a 2 liter soda bottle /day !  ============================================================ ============================================================  -  Liver enzymes are continuing to improve  ============================================================ ============================================================  -  Low TSH  shows Thyroid hormone has gone from                                                                too low  to now slightly high, But . . . .   - Suggest keep dose same for now until see Caryl Pina for                                                            March 9 office visit &will recheck labs then  ============================================================ ============================================================  -

## 2021-03-15 ENCOUNTER — Ambulatory Visit: Payer: 59 | Admitting: Neurology

## 2021-03-22 ENCOUNTER — Ambulatory Visit: Payer: 59 | Admitting: Neurology

## 2021-03-27 ENCOUNTER — Other Ambulatory Visit: Payer: Self-pay | Admitting: Adult Health

## 2021-03-27 DIAGNOSIS — E785 Hyperlipidemia, unspecified: Secondary | ICD-10-CM

## 2021-04-03 ENCOUNTER — Ambulatory Visit (HOSPITAL_COMMUNITY): Admission: RE | Admit: 2021-04-03 | Payer: 59 | Source: Ambulatory Visit

## 2021-04-11 ENCOUNTER — Encounter: Payer: 59 | Admitting: Neurology

## 2021-04-12 ENCOUNTER — Encounter: Payer: 59 | Admitting: Neurology

## 2021-04-19 ENCOUNTER — Encounter: Payer: Self-pay | Admitting: Cardiology

## 2021-04-20 ENCOUNTER — Ambulatory Visit: Payer: 59 | Admitting: Neurology

## 2021-05-01 ENCOUNTER — Ambulatory Visit: Payer: 59 | Admitting: Cardiology

## 2021-05-02 ENCOUNTER — Telehealth: Payer: Self-pay | Admitting: Cardiology

## 2021-05-02 NOTE — Telephone Encounter (Signed)
Spoke to patient advised I will send message to our Education officer, museum.She will be calling you back to discuss your income and a possible way to help with MRI cost. ?

## 2021-05-02 NOTE — Telephone Encounter (Signed)
Called patient no answer.Unable to leave a message no voice mail. ?

## 2021-05-02 NOTE — Telephone Encounter (Signed)
? ?  pt said, she couldn't get the MRI since it will cost her $2500 out of pocket and can't see Dr. Percival Spanish since she needs it for f/u. She cancelled her appt and wanted to let Dr. Percival Spanish know ?

## 2021-05-02 NOTE — Telephone Encounter (Signed)
Spoke to patient she stated she cannot afford Cardiac MRI.She wanted to know if Dr.Hochrein would order a less expensive test.Advised I will send message to him. ?

## 2021-05-03 ENCOUNTER — Telehealth: Payer: Self-pay | Admitting: Licensed Clinical Social Worker

## 2021-05-03 ENCOUNTER — Ambulatory Visit (HOSPITAL_COMMUNITY): Payer: 59

## 2021-05-03 ENCOUNTER — Other Ambulatory Visit (HOSPITAL_COMMUNITY): Payer: 59

## 2021-05-03 NOTE — Telephone Encounter (Signed)
Re-attempted pt at [phone].  ?No answer, again unable to leave voicemail as mailbox is full.  ?I will re-attempt and attempt sending text tomorrow if still no answer/no call back. ? ?Westley Hummer, MSW, LCSW ?Clinical Social Worker II ?[place] [facility]  ?[phone]- work cell phone (preferred) ?[phone]- desk phone ? ?

## 2021-05-03 NOTE — Telephone Encounter (Signed)
LCSW attempted to reach pt x1 today at [phone].  ?No answer, voicemail full so unable to leave a message.  ?Will re-attempt again today.  ?  ?Westley Hummer, MSW, LCSW ?Clinical Social Worker II ?[place] [facility]  ?[phone]- work cell phone (preferred) ?[phone]- desk phone ?

## 2021-05-03 NOTE — Telephone Encounter (Signed)
LCSW attempted to reach pt x1 today at [phone].  ?No answer, voicemail full so unable to leave a message.  ?Will re-attempt again today.  ? ?Westley Hummer, MSW, LCSW ?Clinical Social Worker II ?[place] [facility]  ?[phone]- work cell phone (preferred) ?[phone]- desk phone ? ?

## 2021-05-04 ENCOUNTER — Telehealth: Payer: Self-pay | Admitting: Licensed Clinical Social Worker

## 2021-05-04 ENCOUNTER — Encounter: Payer: 59 | Admitting: Adult Health

## 2021-05-04 ENCOUNTER — Ambulatory Visit: Payer: 59 | Admitting: Cardiology

## 2021-05-04 NOTE — Telephone Encounter (Signed)
LCSW able to reach pt today via telephone at [phone]. ?Introduced self, role, reason for call. I let pt know that her voicemail is currently full and that I was unable to leave a message for pt yesterday. Confirmed home address, PCP, and emergency contacts. Pt shares that she has a high deductible plan and that since it is still the beginning of the year she would owe about $2,200 for the cardiac MRI.  ? ?I shared that pt has three options at this time. ?1- speak with business/billing office at [phone] regarding payment plans etc for upfront costs.  ? ?2- consider hardship program through [facility]. Unfortunately, patients have to have $5,000 or greater in outstanding bills to qualify and pt does not have any additional outstanding bills at this time.  ? ?3- we could speak with pt about patient care fund assistance for any past due/outstanding household bills. This eligibility would depend on her and her husbands income. The patient care fund cannot be utilized copayments, but could assist with relieving another financial burden if eligible.  ? ? ?Pt appreciative of options, she will call business/billing office regarding possible payment plans. She had questions about rescheduling cancelled appt for later date, requests I send this to Dr. Rosezella [place] team for f/u. I will do so, mailed my card to pt and remain available.  ? ?Westley Hummer, MSW, LCSW ?Clinical Social Worker II ?[place] [facility]  ?[phone]- work cell phone (preferred) ?[phone]- desk phone ? ?

## 2021-05-08 ENCOUNTER — Other Ambulatory Visit: Payer: Self-pay | Admitting: Internal Medicine

## 2021-05-08 DIAGNOSIS — E039 Hypothyroidism, unspecified: Secondary | ICD-10-CM

## 2021-05-09 ENCOUNTER — Telehealth (HOSPITAL_BASED_OUTPATIENT_CLINIC_OR_DEPARTMENT_OTHER): Payer: Self-pay | Admitting: *Deleted

## 2021-05-09 DIAGNOSIS — I421 Obstructive hypertrophic cardiomyopathy: Secondary | ICD-10-CM

## 2021-05-09 NOTE — Telephone Encounter (Signed)
[name], LCSW ?to Me   ?IC ?  12:09 PM ?Hi Olivia Walsh,  ?This pt would like to get rescheduled for cardiac MRI, she plans to speak with business/billing office due to high copay about a payment plan. Not sure who can get her back on the schedule. Thanks!  ? ?Message sent to scheduling team to reach out to patient to get scheduled  ?

## 2021-05-16 NOTE — Telephone Encounter (Signed)
Message sent to cardiac MRI scheduling. ?

## 2021-05-17 ENCOUNTER — Encounter: Payer: 59 | Admitting: Adult Health

## 2021-05-22 ENCOUNTER — Telehealth: Payer: Self-pay | Admitting: Neurology

## 2021-05-22 NOTE — Telephone Encounter (Signed)
I lvm for patient that Dr. April Manson will be off on April 13 and to call the office back to get rescheduled. He has openings earlier in the week. ?

## 2021-05-31 ENCOUNTER — Encounter: Payer: 59 | Admitting: Neurology

## 2021-06-07 ENCOUNTER — Encounter: Payer: 59 | Admitting: Nurse Practitioner

## 2021-06-08 ENCOUNTER — Ambulatory Visit: Payer: 59 | Admitting: Neurology

## 2021-06-12 ENCOUNTER — Telehealth: Payer: Self-pay

## 2021-06-12 DIAGNOSIS — I2510 Atherosclerotic heart disease of native coronary artery without angina pectoris: Secondary | ICD-10-CM

## 2021-06-12 DIAGNOSIS — Z01812 Encounter for preprocedural laboratory examination: Secondary | ICD-10-CM

## 2021-06-12 NOTE — Telephone Encounter (Signed)
Spoke to patient advised she will need a bmet and cbc before her cardiac ct scheduled 4/19.Stated she will have in the morning.Orders placed. ?

## 2021-06-13 ENCOUNTER — Telehealth (HOSPITAL_COMMUNITY): Payer: Self-pay | Admitting: Emergency Medicine

## 2021-06-13 LAB — BASIC METABOLIC PANEL
BUN/Creatinine Ratio: 17 (ref 12–28)
BUN: 38 mg/dL — ABNORMAL HIGH (ref 8–27)
CO2: 29 mmol/L (ref 20–29)
Calcium: 9.2 mg/dL (ref 8.7–10.3)
Chloride: 95 mmol/L — ABNORMAL LOW (ref 96–106)
Creatinine, Ser: 2.18 mg/dL — ABNORMAL HIGH (ref 0.57–1.00)
Glucose: 83 mg/dL (ref 70–99)
Potassium: 3.3 mmol/L — ABNORMAL LOW (ref 3.5–5.2)
Sodium: 138 mmol/L (ref 134–144)
eGFR: 25 mL/min/{1.73_m2} — ABNORMAL LOW (ref >59)

## 2021-06-13 LAB — CBC WITH DIFFERENTIAL/PLATELET
Basophils Absolute: 0.1 10*3/uL (ref 0.0–0.2)
Basos: 1 %
EOS (ABSOLUTE): 0.2 10*3/uL (ref 0.0–0.4)
Eos: 3 %
Hematocrit: 37.5 % (ref 34.0–46.6)
Hemoglobin: 12.5 g/dL (ref 11.1–15.9)
Immature Grans (Abs): 0 10*3/uL (ref 0.0–0.1)
Immature Granulocytes: 0 %
Lymphocytes Absolute: 1.5 10*3/uL (ref 0.7–3.1)
Lymphs: 24 %
MCH: 30.9 pg (ref 26.6–33.0)
MCHC: 33.3 g/dL (ref 31.5–35.7)
MCV: 93 fL (ref 79–97)
Monocytes Absolute: 0.7 10*3/uL (ref 0.1–0.9)
Monocytes: 12 %
Neutrophils Absolute: 3.6 10*3/uL (ref 1.4–7.0)
Neutrophils: 60 %
Platelets: 148 10*3/uL — ABNORMAL LOW (ref 150–450)
RBC: 4.05 x10E6/uL (ref 3.77–5.28)
RDW: 12.4 % (ref 11.7–15.4)
WBC: 6 10*3/uL (ref 3.4–10.8)

## 2021-06-13 NOTE — Telephone Encounter (Signed)
Reaching out to patient to offer assistance regarding upcoming cardiac imaging study; pt verbalizes understanding of appt date/time, parking situation and where to check in, pre-test NPO status and medications ordered, and verified current allergies; name and call back number provided for further questions should they arise ?Marchia Bond RN Navigator Cardiac Imaging ?[facility] ?[phone] office ?[phone] cell ? ?Denies claustro ?Denies metal implants ?Difficult IV start ?Arrival 730 ?

## 2021-06-14 ENCOUNTER — Ambulatory Visit (HOSPITAL_COMMUNITY)
Admission: RE | Admit: 2021-06-14 | Discharge: 2021-06-14 | Disposition: A | Discharge status: Home / Self Care | Payer: 59 | Source: Ambulatory Visit | Attending: Cardiology | Admitting: Cardiology

## 2021-06-14 DIAGNOSIS — I10 Essential (primary) hypertension: Secondary | ICD-10-CM | POA: Diagnosis present

## 2021-06-14 DIAGNOSIS — E785 Hyperlipidemia, unspecified: Secondary | ICD-10-CM | POA: Insufficient documentation

## 2021-06-14 DIAGNOSIS — I2510 Atherosclerotic heart disease of native coronary artery without angina pectoris: Secondary | ICD-10-CM | POA: Insufficient documentation

## 2021-06-14 DIAGNOSIS — I421 Obstructive hypertrophic cardiomyopathy: Secondary | ICD-10-CM | POA: Insufficient documentation

## 2021-06-14 MED ORDER — GADOBUTROL 1 MMOL/ML IV SOLN
8.0000 mL | Freq: Once | INTRAVENOUS | Status: AC | PRN
  Administered 2021-06-14: 8 mL via INTRAVENOUS

## 2021-06-21 ENCOUNTER — Encounter: Payer: 59 | Admitting: Nurse Practitioner

## 2021-06-21 NOTE — Progress Notes (Signed)
Complete Physical ? ?Assessment and Plan: ? ?Diagnoses and all orders for this visit: ? ?Encounter for routine physical examination with abnormal findings ?Declines PAP, she agrees to CT lung cancer screening, cologuard, she will schedule mammogram ? ?Atherosclerosis of aorta ?Per CT 07/2018 ?Control blood pressure, cholesterol, glucose, increase exercise.  ? ?Essential hypertension ?Continue medication ?Monitor blood pressure at home; call if consistently over 130/80 ?Continue to recommend DASH diet.   ?Reminder to go to the ER if any CP, SOB, nausea, dizziness, severe HA, changes vision/speech, left arm numbness and tingling and jaw pain. ? ?Atherosclerosis of native coronary artery of native heart without angina pectoris ?Continue to follow with Cardiology, last saw Dr. Percival Spanish in 02/2021, has appt scheduled 07/26/21 ?Quit smoking - patient has cut down to 4-5 a day ?Continue plavix ? ? ?Hx of CVA ?Recommended she quit smoking, continue plavix, statin.  ? ?Hypothyroidism, unspecified type ?continue medications the same ?reminded to take on an empty stomach 30-35mns before food.  ?-     TSH ? ?Seizure disorder ([place] ?Continue keppra; recommended she quit smoking but unwilling at this time. Monitor levels.  ?[facility]; last in 2012 per patient  ?- Keppra level ? ?Stage 4 CKD  ?[facility] ?-     CMP/GFR ?-     Urinalysis, Complete (81001) ?-     Microalbumin / creatinine urine ratio ? ?Anemia due to CKD ([facility] ?Nephrology is managing -  ?Check CBC ? ?Hyperlipidemia ?Continue medications ?LDL goal <70 ?Continue to recommend low cholesterol diet and exercise.  ?-     Lipid panel ? ?SMOKER ?Continues to smoke 4-5 cigarettes a day- 45+ pack year history instruction/counseling given, counseled patient on the dangers of tobacco use, advised patient to stop smoking, and reviewed strategies to maximize success, Chantix gave pt nightmares. ?- Discussed low  dose screening CT and will proceed with patient's permission, due 09/2021 ? ?Emphysema ([place] ?Per imaging, recommended stop smoking ?Denies sx; monitor  ? ?Vitamin D deficiency ?Will check value and plan supplementation based on result ?-     VITAMIN D 25 Hydroxy (Vit-D Deficiency, Fractures) ? ?Abnormal Glucose ?Discussed disease and risks ?Discussed diet/exercise, weight management  ?-     Hemoglobin A1c ? ?Medication management ?-     CBC with Differential/Platelet ?-     CMP/GFR ?-     Magnesium  ? ?BMI 24 ?Long discussion about weight loss, diet, and exercise ?Recommended diet heavy in fruits and veggies and low in animal meats, cheeses, and dairy products, appropriate calorie intake ?Discussed appropriate weight for height  ?Follow up at next visit ? ?Screening for hematuria/proteinuria ?- Routine UA with reflex microscopic ?- Microablumin/creatinine urine ratio ? ?Screening for ischemic heart disease ?- EKG ? ?Screening for colorectal cancer ?Colonoscopy- patient declines a colonoscopy even though the risks and benefits were discussed at length. Colon cancer is 3rd most diagnosed cancer and 2nd leading cause of death in both men and women 59years of age and older. Patient understands the risk of cancer and death with declining the test however they are willing to do cologuard screening instead. They understand that this is not as sensitive or specific as a colonoscopy and they are still recommended to get a colonoscopy.Never completed cologuard- reordered today ? ?Peripheral vascular disease ([place] ?Continue aggressive treatment of cholesterol ?Continue plavix ?ABI reordered ?Continue cardiology follow up -  ? ?PVD/Diminished pulses/LLE claudication ?- ABI ordered, has not had ?  Control blood pressure, cholesterol, glucose, increase exercise, on plavix  ? ?Left foot drop ?- Completed PT last year ?- Has been practicing the techniques/exercises, wears foot brace to keep foot up ? ?Poor compliance ?Reviewed  overdue tests with patient; she agrees to complete cologuard, schedule mammogram ?Will continue close follow up q26mvisits, call to verify progress in a few weeks ? ? ?Discussed med's effects and SE's. Screening labs and tests as requested with regular follow-up as recommended. ?Over 40 minutes of exam, counseling, chart review, and complex, high level critical decision making was performed this visit.  ? ?Future Appointments  ?Date Time Provider [place] ?06/28/2021  2:45 PM CAlric Ran MD [facility] None  ?07/26/2021  8:20 AM HMinus Breeding MD [facility]  ?06/28/2022  3:00 PM MMagda Bernheim NP GAAM-[facility] None  ? ? ? ?HPI  ?61y.o. Caucasian married female,  - presents for a complete physical. She has Hypothyroidism; Hyperlipidemia; SMOKER; Essential hypertension; CAD (coronary artery disease); History of CVA (cerebrovascular accident); Peripheral vascular disease ([place]; CKD (chronic kidney disease) stage 4, GFR 15-29 ml/min ([place]; Seizure disorder ([place]; Vitamin D deficiency; Poor compliance; Medication management; Overweight (BMI 25.0-29.9); Anemia in chronic kidney disease; Claudication of left lower extremity ([place]; Emphysema of lung ([place]; Aortic Atherosclerosis ([place] by Chest CT scan in June 2020 ; Cholelithiases; Atypical nevus of right lower back; History of basal cell carcinoma (BCC); Multiple falls; Left foot drop; Elevated LFTs; Acute kidney injury ([place]; Complex renal cyst; Hypotension; Hypokalemia; Hyponatremia; History of seizure; and HOCM (hypertrophic obstructive cardiomyopathy) ([place] on their problem list.  ? ?Married, works from home for [employer] No children. Has a dog (5 lb mico yorkie).  ? ?No concerns today.  ? ?Hx of ruptured cerebral aneurysm in 2005 with repeated repair in 2012, hx of seizure disorder on keppra - no recent seizures - last in 2008-  [facility], now following keppra levels here in office.  ? ?She is followed by  Nephrology Dr. KCorliss Parishdue to progression to CKD IV. ? ?she currently continues to smoke 0.5 pack a day; 45+ pack year history; discussed risks associated with smoking at length, patient is not ready to quit. She states she is very aware of risks and has quit in the past. She is aware that she needs to make this decision for her health. She has failed numerous attempts with Chantix. Down from 1.5-2 packs to 0.5 pack daily. Last CT 10/25/20 showed no concerning nodules, did show emphysemia and aortic atherosclerosis. Recommended continued annual screening. Repeat due 09/2021 ? ?BMI is Body mass index is 24.1 kg/m?., she has been working on diet and exercise. She has a pedal exerciser at her desk and uses daily - makes small healthy choices.  ?Wt Readings from Last 3 Encounters:  ?06/27/21 151 lb 9.6 oz (68.8 kg)  ?03/13/21 160 lb (72.6 kg)  ?03/03/21 262 lb 6.4 oz (119 kg)  ? ?She has ASCVD with bypass in 2002, followed annually by cardiology Dr. NAcie Fredricksonlast 2019, overdue.  ? ?She has hx of PVD, on statin and plavix, has been reporting aching in LLE after 5 blocks, ABI has been ordered in the past but was not completed. She has atherosclerosis of the aorta per CT 07/2018. ? ?Her blood pressure has been controlled at home, today their BP is BP: (!) 102/58  ?BP Readings from Last 3 Encounters:  ?06/27/21 (!) 102/58  ?03/13/21 138/76  ?03/03/21 108/64  ?She does workout- cycler  at her desk.   She denies chest pain, shortness of breath, dizziness.  ? ? ?She is on cholesterol medication (rosuvastatin 40 mg daily, zetia 10 mg daily) and denies myalgias. Her cholesterol is at goal. The cholesterol last visit was:   ?Lab Results  ?Component Value Date  ? CHOL 94 01/30/2021  ? HDL 35 (L) 01/30/2021  ? [place] 40 01/30/2021  ? TRIG 107 01/30/2021  ? CHOLHDL 2.7 01/30/2021  ? ?She has been working on diet and exercise for glucose management, she is not on bASA (on plavix), she is on ACE/ARB and denies increased  appetite, nausea, paresthesia of the feet, polydipsia, polyuria, visual disturbances and vomiting. Last A1C in the office was:  ?Lab Results  ?Component Value Date  ? HGBA1C 5.5 01/30/2021  ? ?She has CKD IV and is follow

## 2021-06-27 ENCOUNTER — Encounter: Payer: Self-pay | Admitting: Nurse Practitioner

## 2021-06-27 ENCOUNTER — Ambulatory Visit (INDEPENDENT_AMBULATORY_CARE_PROVIDER_SITE_OTHER): Payer: 59 | Admitting: Nurse Practitioner

## 2021-06-27 VITALS — BP 102/58 | HR 55 | Temp 97.7°F | Ht 66.5 in | Wt 151.6 lb

## 2021-06-27 DIAGNOSIS — Z8673 Personal history of transient ischemic attack (TIA), and cerebral infarction without residual deficits: Secondary | ICD-10-CM

## 2021-06-27 DIAGNOSIS — D631 Anemia in chronic kidney disease: Secondary | ICD-10-CM

## 2021-06-27 DIAGNOSIS — C44519 Basal cell carcinoma of skin of other part of trunk: Secondary | ICD-10-CM

## 2021-06-27 DIAGNOSIS — Z79899 Other long term (current) drug therapy: Secondary | ICD-10-CM

## 2021-06-27 DIAGNOSIS — M21372 Foot drop, left foot: Secondary | ICD-10-CM

## 2021-06-27 DIAGNOSIS — E559 Vitamin D deficiency, unspecified: Secondary | ICD-10-CM

## 2021-06-27 DIAGNOSIS — I10 Essential (primary) hypertension: Secondary | ICD-10-CM | POA: Diagnosis not present

## 2021-06-27 DIAGNOSIS — I700 Atherosclerosis of aorta: Secondary | ICD-10-CM

## 2021-06-27 DIAGNOSIS — E782 Mixed hyperlipidemia: Secondary | ICD-10-CM

## 2021-06-27 DIAGNOSIS — Z136 Encounter for screening for cardiovascular disorders: Secondary | ICD-10-CM

## 2021-06-27 DIAGNOSIS — G40909 Epilepsy, unspecified, not intractable, without status epilepticus: Secondary | ICD-10-CM

## 2021-06-27 DIAGNOSIS — Z0000 Encounter for general adult medical examination without abnormal findings: Secondary | ICD-10-CM | POA: Diagnosis not present

## 2021-06-27 DIAGNOSIS — Z0001 Encounter for general adult medical examination with abnormal findings: Secondary | ICD-10-CM

## 2021-06-27 DIAGNOSIS — R7309 Other abnormal glucose: Secondary | ICD-10-CM

## 2021-06-27 DIAGNOSIS — Z1211 Encounter for screening for malignant neoplasm of colon: Secondary | ICD-10-CM

## 2021-06-27 DIAGNOSIS — J432 Centrilobular emphysema: Secondary | ICD-10-CM

## 2021-06-27 DIAGNOSIS — E039 Hypothyroidism, unspecified: Secondary | ICD-10-CM

## 2021-06-27 DIAGNOSIS — I739 Peripheral vascular disease, unspecified: Secondary | ICD-10-CM

## 2021-06-27 DIAGNOSIS — E663 Overweight: Secondary | ICD-10-CM

## 2021-06-27 DIAGNOSIS — Z91199 Patient's noncompliance with other medical treatment and regimen due to unspecified reason: Secondary | ICD-10-CM

## 2021-06-27 DIAGNOSIS — N184 Chronic kidney disease, stage 4 (severe): Secondary | ICD-10-CM

## 2021-06-27 DIAGNOSIS — Z1389 Encounter for screening for other disorder: Secondary | ICD-10-CM

## 2021-06-27 DIAGNOSIS — F17200 Nicotine dependence, unspecified, uncomplicated: Secondary | ICD-10-CM

## 2021-06-27 DIAGNOSIS — I2510 Atherosclerotic heart disease of native coronary artery without angina pectoris: Secondary | ICD-10-CM

## 2021-06-28 ENCOUNTER — Ambulatory Visit: Payer: 59 | Admitting: Neurology

## 2021-06-29 ENCOUNTER — Encounter: Payer: Self-pay | Admitting: Neurology

## 2021-06-29 LAB — URINALYSIS, ROUTINE W REFLEX MICROSCOPIC
Bacteria, UA: NONE SEEN /HPF
Bilirubin Urine: NEGATIVE
Glucose, UA: NEGATIVE
Hyaline Cast: NONE SEEN /LPF
Ketones, ur: NEGATIVE
Leukocytes,Ua: NEGATIVE
Nitrite: NEGATIVE
RBC / HPF: NONE SEEN /HPF (ref 0–2)
Specific Gravity, Urine: 1.009 (ref 1.001–1.035)
Squamous Epithelial / HPF: NONE SEEN /HPF (ref <=5)
WBC, UA: NONE SEEN /HPF (ref 0–5)
pH: 6 (ref 5.0–8.0)

## 2021-06-29 LAB — CBC WITH DIFFERENTIAL/PLATELET
Absolute Monocytes: 684 cells/uL (ref 200–950)
Basophils Absolute: 60 cells/uL (ref 0–200)
Basophils Relative: 1 %
Eosinophils Absolute: 240 cells/uL (ref 15–500)
Eosinophils Relative: 4 %
HCT: 39.9 % (ref 35.0–45.0)
Hemoglobin: 13.2 g/dL (ref 11.7–15.5)
Lymphs Abs: 1932 cells/uL (ref 850–3900)
MCH: 31.3 pg (ref 27.0–33.0)
MCHC: 33.1 g/dL (ref 32.0–36.0)
MCV: 94.5 fL (ref 80.0–100.0)
MPV: 10.6 fL (ref 7.5–12.5)
Monocytes Relative: 11.4 %
Neutro Abs: 3084 cells/uL (ref 1500–7800)
Neutrophils Relative %: 51.4 %
Platelets: 177 10*3/uL (ref 140–400)
RBC: 4.22 10*6/uL (ref 3.80–5.10)
RDW: 12.4 % (ref 11.0–15.0)
Total Lymphocyte: 32.2 %
WBC: 6 10*3/uL (ref 3.8–10.8)

## 2021-06-29 LAB — LEVETIRACETAM, IMMUNOASSAY: LEVETIRACETAM, IMMUNOASSAY: 45 ug/mL (ref 6.0–46.0)

## 2021-06-29 LAB — COMPLETE METABOLIC PANEL WITH GFR
AG Ratio: 1.7 (calc) (ref 1.0–2.5)
ALT: 103 U/L — ABNORMAL HIGH (ref 6–29)
AST: 128 U/L — ABNORMAL HIGH (ref 10–35)
Albumin: 4.1 g/dL (ref 3.6–5.1)
Alkaline phosphatase (APISO): 58 U/L (ref 37–153)
BUN/Creatinine Ratio: 13 (calc) (ref 6–22)
BUN: 33 mg/dL — ABNORMAL HIGH (ref 7–25)
CO2: 30 mmol/L (ref 20–32)
Calcium: 9.2 mg/dL (ref 8.6–10.4)
Chloride: 98 mmol/L (ref 98–110)
Creat: 2.49 mg/dL — ABNORMAL HIGH (ref 0.50–1.05)
Globulin: 2.4 g/dL (calc) (ref 1.9–3.7)
Glucose, Bld: 86 mg/dL (ref 65–99)
Potassium: 3.9 mmol/L (ref 3.5–5.3)
Sodium: 139 mmol/L (ref 135–146)
Total Bilirubin: 0.4 mg/dL (ref 0.2–1.2)
Total Protein: 6.5 g/dL (ref 6.1–8.1)
eGFR: 21 mL/min/{1.73_m2} — ABNORMAL LOW (ref >=60)

## 2021-06-29 LAB — LIPID PANEL
Cholesterol: 90 mg/dL (ref <200)
HDL: 25 mg/dL — ABNORMAL LOW (ref >=50)
LDL Cholesterol (Calc): 41 mg/dL (calc)
Non-HDL Cholesterol (Calc): 65 mg/dL (calc) (ref <130)
Total CHOL/HDL Ratio: 3.6 (calc) (ref <5.0)
Triglycerides: 161 mg/dL — ABNORMAL HIGH (ref <150)

## 2021-06-29 LAB — VITAMIN D 25 HYDROXY (VIT D DEFICIENCY, FRACTURES): Vit D, 25-Hydroxy: 131 ng/mL — ABNORMAL HIGH (ref 30–100)

## 2021-06-29 LAB — MICROALBUMIN / CREATININE URINE RATIO
Creatinine, Urine: 44 mg/dL (ref 20–275)
Microalb Creat Ratio: 182 mcg/mg creat — ABNORMAL HIGH (ref <30)
Microalb, Ur: 8 mg/dL

## 2021-06-29 LAB — MAGNESIUM: Magnesium: 2.6 mg/dL — ABNORMAL HIGH (ref 1.5–2.5)

## 2021-06-29 LAB — HEMOGLOBIN A1C
Hgb A1c MFr Bld: 5.5 % of total Hgb (ref <5.7)
Mean Plasma Glucose: 111 mg/dL
eAG (mmol/L): 6.2 mmol/L

## 2021-06-29 LAB — TSH: TSH: 0.02 mIU/L — ABNORMAL LOW (ref 0.40–4.50)

## 2021-07-02 ENCOUNTER — Other Ambulatory Visit: Payer: Self-pay | Admitting: Adult Health

## 2021-07-25 DIAGNOSIS — E785 Hyperlipidemia, unspecified: Secondary | ICD-10-CM | POA: Insufficient documentation

## 2021-07-25 NOTE — Progress Notes (Unsigned)
Cardiology Office Note   Date:  07/26/2021   ID:  Olivia Walsh, DOB 29-Mar-1960, MRN [id number]  PCP:  [name], MD  Cardiologist:   Minus Breeding, MD   Chief Complaint  Patient presents with   Fatigue      History of Present Illness: Olivia Walsh is a 61 y.o. female who presents for follow up of CAD s/p CABG at age 54, HTN, HLD, tobacco abuse, hypothyroidism and cerebral aneurysms with prior coiling and subarachnoid hemorrhage 2005, PVD with bilateral iliac stents 2011(per [facility] from [facility], pt denies) and seizure disorder.   Since I last saw her she had an MRI demonstrating severe asymmetric septal hypertrophy of 16 mm.  She had SAM with MR moderate.  Since I saw her she has had some increased fatigue.  Her blood pressures been running low and she is lost some weight.  She does get a little dizzy and fatigued with activities at times.  She has some baseline shortness of breath but she is not describing any new shortness of breath, PND or orthopnea.  She has not a chest discomfort that she had prior to her bypass.    Past Medical History:  Diagnosis Date   Aneurysm ([place])    CAD (coronary artery disease) 2002   hx CABG 2002, Cath 2011 w/ occluded native vessels but LIMA-LAD, SVG-pLAD, SVG-Diag, SVG-OM patent, SVG-RCA occluded   CKD (chronic kidney disease) stage 3, GFR 30-59 ml/min (HCC)    History of basal cell carcinoma excision 12/20/2017   Hyperlipidemia LDL goal <70    Hypertension    Hypothyroidism    Tobacco abuse    Vitamin D deficiency     Past Surgical History:  Procedure Laterality Date   CATARACT EXTRACTION, BILATERAL Bilateral 2020   CEREBRAL ANEURYSM REPAIR Right 2012   CEREBRAL ANEURYSM REPAIR Right 2005   CORONARY ARTERY BYPASS GRAFT  2002   Dr Cyndia Bent   EMBOLIZATION  2005   Embolization of right PICA aneurysm     Current Outpatient Medications  Medication Sig Dispense Refill   clopidogrel (PLAVIX) 75 MG tablet Take  1  tablet   Daily  to Prevent Blood Clots                                   /                            TAKE                          BY                     MOUTH 90 tablet 3   ezetimibe (ZETIA) 10 MG tablet TAKE 1 TABLET(10 MG) BY MOUTH DAILY (Patient taking differently: Take 10 mg by mouth in the morning.) 90 tablet 3   furosemide (LASIX) 20 MG tablet Take 1 tablet (20 mg total) by mouth daily as needed. 90 tablet 1   levETIRAcetam (KEPPRA) 500 MG tablet TAKE 1 TABLET BY MOUTH TWICE DAILY TO PREVENT SEIZURES (Patient taking differently: 500 mg See admin instructions. Take 500 mg by mouth at 7 AM and 7 PM) 180 tablet 3   levothyroxine (SYNTHROID) 88 MCG tablet TAKE 1 TABLET BY MOUTH DAILY ON AN EMPTY STOMACH WITH ONLY WATER FOR 30 MINUTES  AND NO ANTACID MEDS, CALCIUM OR MAGNESIUM FOR 4 HOURS, AVOID BIOTIN 90 tablet 0   metoprolol succinate (TOPROL-XL) 25 MG 24 hr tablet TAKE 1 TABLET(25 MG) BY MOUTH DAILY 90 tablet 3   rosuvastatin (CRESTOR) 40 MG tablet TAKE 1 TABLET BY MOUTH  DAILY FOR CHOLESTEROL 90 tablet 1   No current facility-administered medications for this visit.    Allergies:   Ace inhibitors, Penicillins, and Prednisone     ROS:  Please see the history of present illness.   Otherwise, review of systems are positive for none.   All other systems are reviewed and negative.    PHYSICAL EXAM: VS:  BP (!) 92/50 (BP Location: Left Arm, Patient Position: Sitting, Cuff Size: Normal)   Pulse (!) 57   Ht '5\' 7"'$  (1.702 m)   Wt 147 lb (66.7 kg)   BMI 23.02 kg/m  , BMI Body mass index is 23.02 kg/m. GENERAL:  Well appearing NECK:  No jugular venous distention, waveform within normal limits, carotid upstroke brisk and symmetric, no bruits, no thyromegaly LUNGS:  Clear to auscultation bilaterally CHEST:  Well healed sternotomy scar. HEART:  PMI not displaced or sustained,S1 and S2 within normal limits, no S3, no S4, no clicks, no rubs, 3 of 6 apical systolic murmur radiating at the aortic  outflow tract and increasing with the strain phase of Valsalva, no diastolic murmurs ABD:  Flat, positive bowel sounds normal in frequency in pitch, no bruits, no rebound, no guarding, no midline pulsatile mass, no hepatomegaly, no splenomegaly EXT:  2 plus pulses throughout, no edema, no cyanosis no clubbing  EKG:  EKG is not ordered today. NA  Recent Labs: 06/27/2021: ALT 103; BUN 33; Creat 2.49; Hemoglobin 13.2; Magnesium 2.6; Platelets 177; Potassium 3.9; Sodium 139; TSH 0.02    Lipid Panel    Component Value Date/Time   CHOL 90 06/27/2021 1536   TRIG 161 (H) 06/27/2021 1536   HDL 25 (L) 06/27/2021 1536   CHOLHDL 3.6 06/27/2021 1536   VLDL 30 09/26/2016 1505   LDLCALC 41 06/27/2021 1536      Wt Readings from Last 3 Encounters:  07/26/21 147 lb (66.7 kg)  06/27/21 151 lb 9.6 oz (68.8 kg)  03/13/21 160 lb (72.6 kg)      Other studies Reviewed: Additional studies/ records that were reviewed today include: Labs Review of the above records demonstrates:  Please see elsewhere in the note.     ASSESSMENT AND PLAN:  HOCM:   I have sent a message to Dr. Gasper Sells to inquire about mavacamten.  I am going to get rid of her Dyazide.  I am getting give her as needed Lasix if she has any swelling or shortness of breath.  AKI:  His creat was 2.49 she is followed by nephrology.   CAD:    The patient has no new sypmtoms.  No further cardiovascular testing is indicated.  We will continue with aggressive risk reduction and meds as listed.  DYSLIPIDEMIA: LDL was 41 with an HDL of 25.  No change in therapy.   HTN: Her blood pressure is running low and she is fatigued so I will back off on her medicines as above.   This might actually help any resting gradient that she has.  Current medicines are reviewed at length with the patient today.  The patient does not have concerns regarding medicines.  The following changes have been made: As above  Labs/ tests ordered today include:     No  orders of the defined types were placed in this encounter.    Disposition:   FU with me 2 months  Signed, Minus Breeding, MD  07/26/2021 8:58 AM    [facility]

## 2021-07-26 ENCOUNTER — Ambulatory Visit (INDEPENDENT_AMBULATORY_CARE_PROVIDER_SITE_OTHER): Payer: 59 | Admitting: Cardiology

## 2021-07-26 ENCOUNTER — Encounter: Payer: Self-pay | Admitting: Cardiology

## 2021-07-26 VITALS — BP 92/50 | HR 57 | Ht 67.0 in | Wt 147.0 lb

## 2021-07-26 DIAGNOSIS — I421 Obstructive hypertrophic cardiomyopathy: Secondary | ICD-10-CM | POA: Diagnosis not present

## 2021-07-26 DIAGNOSIS — I2510 Atherosclerotic heart disease of native coronary artery without angina pectoris: Secondary | ICD-10-CM | POA: Diagnosis not present

## 2021-07-26 DIAGNOSIS — I10 Essential (primary) hypertension: Secondary | ICD-10-CM | POA: Diagnosis not present

## 2021-07-26 DIAGNOSIS — E785 Hyperlipidemia, unspecified: Secondary | ICD-10-CM

## 2021-07-26 MED ORDER — FUROSEMIDE 20 MG PO TABS: 20.0000 mg | ORAL_TABLET | Freq: Every day | ORAL | 1 refills | Status: DC | PRN

## 2021-07-26 NOTE — Patient Instructions (Signed)
Medication Instructions:  STOP Dyazide  START Lasix 20 mg as needed.   *If you need a refill on your cardiac medications before your next appointment, please call your pharmacy*   Follow-Up: At [facility], you and your health needs are our priority.  As part of our continuing mission to provide you with exceptional heart care, we have created designated Provider Care Teams.  These Care Teams include your primary Cardiologist (physician) and Advanced Practice Providers (APPs -  Physician Assistants and Nurse Practitioners) who all work together to provide you with the care you need, when you need it.  We recommend signing up for the patient portal called "MyChart".  Sign up information is provided on this After Visit Summary.  MyChart is used to connect with patients for Virtual Visits (Telemedicine).  Patients are able to view lab/test results, encounter notes, upcoming appointments, etc.  Non-urgent messages can be sent to your provider as well.   To learn more about what you can do with MyChart, go to [email or web address].    Your next appointment:   2 month(s)  The format for your next appointment:   In Person  Provider:   Minus Breeding, MD {

## 2021-08-03 ENCOUNTER — Other Ambulatory Visit: Payer: Self-pay | Admitting: Nurse Practitioner

## 2021-08-03 DIAGNOSIS — E039 Hypothyroidism, unspecified: Secondary | ICD-10-CM

## 2021-09-01 ENCOUNTER — Other Ambulatory Visit: Payer: Self-pay | Admitting: Nurse Practitioner

## 2021-09-01 DIAGNOSIS — R569 Unspecified convulsions: Secondary | ICD-10-CM

## 2021-09-08 ENCOUNTER — Other Ambulatory Visit: Payer: Self-pay | Admitting: Urology

## 2021-09-08 DIAGNOSIS — N281 Cyst of kidney, acquired: Secondary | ICD-10-CM

## 2021-09-11 ENCOUNTER — Other Ambulatory Visit: Payer: Self-pay | Admitting: Nurse Practitioner

## 2021-09-11 DIAGNOSIS — I739 Peripheral vascular disease, unspecified: Secondary | ICD-10-CM

## 2021-09-12 ENCOUNTER — Telehealth: Payer: Self-pay | Admitting: Cardiology

## 2021-09-12 NOTE — Telephone Encounter (Signed)
Returned call to patient, patient was not sure what her appt on 7/31 (ABI+aorta/iliac) was.   Ordered by Lorenda Peck NP.    Explained what test was, patient verbalized understanding.

## 2021-09-12 NOTE — Telephone Encounter (Signed)
Pt would like for nurse to return call regarding questions that she has about upcoming appt on 09/25/21. Please advise

## 2021-09-13 ENCOUNTER — Other Ambulatory Visit: Payer: Self-pay | Admitting: Adult Health

## 2021-09-13 DIAGNOSIS — E785 Hyperlipidemia, unspecified: Secondary | ICD-10-CM

## 2021-09-14 ENCOUNTER — Ambulatory Visit (HOSPITAL_COMMUNITY): Payer: 59

## 2021-09-17 NOTE — Progress Notes (Signed)
Cardiology Office Note:    Date:  09/18/2021   ID:  Olivia Walsh, DOB 1960/10/04, MRN [id number]  PCP:  Olivia Walsh, [place] Providers Cardiologist:  Minus Breeding, MD     Referring MD: Minus Breeding, MD   CC: HCM Consulted for the evaluation of oHCM- potential CMI initiation at the behest of Dr. Percival Spanish  History of Present Illness:    Olivia Walsh is a 61 y.o. female with a hx of oHCM (Peak gradient 102 in 2022, moderate MR, septal thickness 16 mm, no LGE).  Patient notes no SOB at rest and no DOE Is able to do ADLs on BB therapy. Notes significant fatigue. Doesn't feel good.She has dropfoot of the left leg and feels scared to walk. Has done PT. Notes no palpitations Notes no CP. Notes notes lightheaded when standing up, this leads to her having to stand up slowly. Notes no syncope. Notable family events include: MGF- died 42s at work suddenly. Mother died in sleep Older brother died in 14s in hot tub NOS Younger brother died in 37s in his sleep Sister is alive No kids  Past Medical History:  Diagnosis Date   Aneurysm ([place])    CAD (coronary artery disease) 2002   hx CABG 2002, Cath 2011 w/ occluded native vessels but LIMA-LAD, SVG-pLAD, SVG-Diag, SVG-OM patent, SVG-RCA occluded   CKD (chronic kidney disease) stage 3, GFR 30-59 ml/min (HCC)    History of basal cell carcinoma excision 12/20/2017   Hyperlipidemia LDL goal <70    Hypertension    Hypothyroidism    Tobacco abuse    Vitamin D deficiency     Past Surgical History:  Procedure Laterality Date   CATARACT EXTRACTION, BILATERAL Bilateral 2020   CEREBRAL ANEURYSM REPAIR Right 2012   CEREBRAL ANEURYSM REPAIR Right 2005   CORONARY ARTERY BYPASS GRAFT  2002   Dr Cyndia Bent   EMBOLIZATION  2005   Embolization of right PICA aneurysm    Current Medications: Current Meds  Medication Sig   clopidogrel (PLAVIX) 75 MG tablet Take  1 tablet   Daily  to Prevent Blood Clots                                    /                            TAKE                          BY                     MOUTH   ezetimibe (ZETIA) 10 MG tablet TAKE 1 TABLET(10 MG) BY MOUTH DAILY   furosemide (LASIX) 20 MG tablet Take 1 tablet (20 mg total) by mouth daily as needed.   levETIRAcetam (KEPPRA) 500 MG tablet TAKE 1 TABLET BY MOUTH TWICE DAILY TO PREVENT SEIZURES   levothyroxine (SYNTHROID) 88 MCG tablet TAKE 1 TABLET BY MOUTH DAILY ON AN EMPTY STOMACH WITH ONLY WATER FOR 30 MINUTES AND NO ANTACID MEDS, CALCIUM OR MAGNESIUM FOR 4 HOURS,AVOID BIOTIN   metoprolol succinate (TOPROL-XL) 25 MG 24 hr tablet TAKE 1 TABLET(25 MG) BY MOUTH DAILY   rosuvastatin (CRESTOR) 40 MG tablet TAKE 1 TABLET BY MOUTH DAILY FOR CHOLESTEROL   [DISCONTINUED] triamterene-hydrochlorothiazide (DYAZIDE) 37.5-25 MG  capsule Take 1 capsule by mouth daily.     Allergies:   Ace inhibitors, Penicillins, and Prednisone   Social History   Socioeconomic History   Marital status: Married    Spouse name: Not on file   Number of children: 0   Years of education: Not on file   Highest education level: Not on file  Occupational History   Not on file  Tobacco Use   Smoking status: Every Day    Packs/day: 1.00    Years: 46.00    Total pack years: 46.00    Types: Cigarettes    Start date: 32   Smokeless tobacco: Never   Tobacco comments:    1/2-2 ppd   Vaping Use   Vaping Use: Never used  Substance and Sexual Activity   Alcohol use: No    Comment: rarely, 1-2 per year   Drug use: No   Sexual activity: Yes    Partners: Male    Birth control/protection: Post-menopausal  Other Topics Concern   Not on file  Social History Narrative   Not on file   Social Determinants of Health   Financial Resource Strain: Not on file  Food Insecurity: Not on file  Transportation Needs: Not on file  Physical Activity: Inactive (04/02/2018)   Exercise Vital Sign    Days of Exercise per Week: 0 days    Minutes of Exercise per  Session: 0 min  Stress: Stress Concern Present (04/02/2018)   [place]    Feeling of Stress : To some extent  Social Connections: Not on file     Family History: The patient's family history includes Brain cancer in her father; Heart attack in her maternal grandfather; Heart attack (age of onset: 49) in her brother; Heart attack (age of onset: 3) in her mother; Heart disease in her brother and mother; Heart disease (age of onset: 36) in her brother; Leukemia (age of onset: 27) in her sister; Multiple sclerosis in her sister.  ROS:   Please see the history of present illness.     All other systems reviewed and are negative.  EKGs/Labs/Other Studies Reviewed:    Recent Labs: 06/27/2021: ALT 103; BUN 33; Creat 2.49; Hemoglobin 13.2; Magnesium 2.6; Platelets 177; Potassium 3.9; Sodium 139; TSH 0.02  Recent Lipid Panel    Component Value Date/Time   CHOL 90 06/27/2021 1536   TRIG 161 (H) 06/27/2021 1536   HDL 25 (L) 06/27/2021 1536   CHOLHDL 3.6 06/27/2021 1536   VLDL 30 09/26/2016 1505   LDLCALC 41 06/27/2021 1536        Physical Exam:    VS:  BP (!) 110/56   Pulse (!) 52   Ht '5\' 7"'$  (1.702 m)   Wt 145 lb 12.8 oz (66.1 kg)   SpO2 97%   BMI 22.84 kg/m     Wt Readings from Last 3 Encounters:  09/18/21 145 lb 12.8 oz (66.1 kg)  07/26/21 147 lb (66.7 kg)  06/27/21 151 lb 9.6 oz (68.8 kg)    Gen: No distress  Neck: No JVD Cardiac: No Rubs or Gallops, systolic ejection murmur at rest and with standing, regular bradycardia Respiratory: Clear to auscultation bilaterally, normal effort, normal  respiratory rate GI: Soft, nontender, non-distended  Integument: Skin feels warm Neuro:  At time of evaluation, alert and oriented to person/place/time/situation  Psych: Normal affect, patient feels OK   ASSESSMENT:    1. HOCM (hypertrophic obstructive cardiomyopathy) ([place])  PLAN:    Hypertrophic Cardiomyopathy -  with presents for discussion of mavacamten and myosin modulation medication - Gradient type: On BB and diuretics she has a severe LVOT gradient - NYHA Class II - on BB with no room to titrate further - No notable CYP modulation medications - Insurance is [facility] - discussed the benefits in quality of life and fitness (pVO2), and discussed the benefits of starting mavacamten - she is not a great SRT surgical candidate given her native LAD disease and bypass and prior open heart surgery  She has NYHA II symptoms; I offered her start of mavacamten After discussion with patient, we will try to decrease her LVOT gradient by stopper her diazide in lieu of PRN lasix (she had misunderstood Dr. Rosezella [place] recommendations) - we will attempt a stress echo for LVOT assessment after stopper her diuretics (she notes that she thinks she can do it in the setting of drop foot or the left left) she will also push herself more in regard to her physical activity - if her fatigue and dizziness sx do not resolve, she is amenable to try mavacamten  3 day non live zio patch for SCD eval  Time Spent Directly with Patient:   I have spent a total of 40 minutes with the patient reviewing notes, imaging, EKGs, labs and examining the patient as well as establishing an assessment and plan that was discussed personally with the patient.  > 50% of time was spent in direct patient care.    Medication Adjustments/Labs and Tests Ordered: Current medicines are reviewed at length with the patient today.  Concerns regarding medicines are outlined above.  Orders Placed This Encounter  Procedures   Cardiac Stress Test: Informed Consent Details: Physician/Practitioner Attestation; Transcribe to consent form and obtain patient signature   LONG TERM MONITOR (3-14 DAYS)   ECHOCARDIOGRAM STRESS TEST   Meds ordered this encounter  Medications   furosemide (LASIX) 20 MG tablet    Sig: Take 1 tablet (20 mg total) by mouth  daily as needed.    Dispense:  30 tablet    Refill:  6    Patient Instructions  Medication Instructions:  Your physician has recommended you make the following change in your medication:  STOP: dyazide START: furosemide (Lasix) 20 mg by mouth daily as needed for swelling/ weight gain *If you need a refill on your cardiac medications before your next appointment, please call your pharmacy*   Lab Work: NONE If you have labs (blood work) drawn today and your tests are completely normal, you will receive your results only by: [place] (if you have MyChart) OR A paper copy in the mail If you have any lab test that is abnormal or we need to change your treatment, we will call you to review the results.   Testing/Procedures: Your physician has requested that you have a stress echocardiogram. For further information please visit [email or web address]. Please follow instruction sheet as given.   Your physician has requested that you wear a 3 day heart monitor.    Follow-Up: At [facility], you and your health needs are our priority.  As part of our continuing mission to provide you with exceptional heart care, we have created designated Provider Care Teams.  These Care Teams include your primary Cardiologist (physician) and Advanced Practice Providers (APPs -  Physician Assistants and Nurse Practitioners) who all work together to provide you with the care you need, when you need it.  We recommend signing up  for the patient portal called "MyChart".  Sign up information is provided on this After Visit Summary.  MyChart is used to connect with patients for Virtual Visits (Telemedicine).  Patients are able to view lab/test results, encounter notes, upcoming appointments, etc.  Non-urgent messages can be sent to your provider as well.   To learn more about what you can do with MyChart, go to [email or web address].    Your next appointment:   2 -3  month(s)  The format for your next  appointment:   In Person  Provider:   Rudean Haskell, MD  Other Instructions [place] Monitor Instructions  Your physician has requested you wear a ZIO patch monitor for 14 days.  This is a single patch monitor. [employer] supplies one patch monitor per enrollment. Additional stickers are not available. Please do not apply patch if you will be having a Nuclear Stress Test,  Echocardiogram, Cardiac CT, MRI, or Chest Xray during the period you would be wearing the  monitor. The patch cannot be worn during these tests. You cannot remove and re-apply the  ZIO XT patch monitor.  Your ZIO patch monitor will be mailed 3 day USPS to your address on file. It may take 3-5 days  to receive your monitor after you have been enrolled.  Once you have received your monitor, please review the enclosed instructions. Your monitor  has already been registered assigning a specific monitor serial # to you.  Billing and Patient Assistance Program Information  We have supplied [employer] with any of your insurance information on file for billing purposes. [employer] offers a sliding scale Patient Assistance Program for patients that do not have  insurance, or whose insurance does not completely cover the cost of the ZIO monitor.  You must apply for the Patient Assistance Program to qualify for this discounted rate.  To apply, please call [employer] at [phone], select option 4, select option 2, ask to apply for  Patient Assistance Program. Theodore Demark will ask your household income, and how many people  are in your household. They will quote your out-of-pocket cost based on that information.  [employer] will also be able to set up a 56-month interest-free payment plan if needed.  Applying the monitor   Shave hair from upper left chest.  Hold abrader disc by orange tab. Rub abrader in 40 strokes over the upper left chest as  indicated in your monitor instructions.  Clean area with 4 enclosed alcohol  pads. Let dry.  Apply patch as indicated in monitor instructions. Patch will be placed under collarbone on left  side of chest with arrow pointing upward.  Rub patch adhesive wings for 2 minutes. Remove white label marked "1". Remove the white  label marked "2". Rub patch adhesive wings for 2 additional minutes.  While looking in a mirror, press and release button in center of patch. A small green light will  flash 3-4 times. This will be your only indicator that the monitor has been turned on.  Do not shower for the first 24 hours. You may shower after the first 24 hours.  Press the button if you feel a symptom. You will hear a small click. Record Date, Time and  Symptom in the Patient Logbook.  When you are ready to remove the patch, follow instructions on the last 2 pages of Patient  Logbook. Stick patch monitor onto the last page of Patient Logbook.  Place Patient Logbook in the blue and white box. Use locking tab  on box and tape box closed  securely. The blue and white box has prepaid postage on it. Please place it in the mailbox as  soon as possible. Your physician should have your test results approximately 7 days after the  monitor has been mailed back to [facility].  Call [place] at [phone] if you have questions regarding  your ZIO XT patch monitor. Call them immediately if you see an orange light blinking on your  monitor.  If your monitor falls off in less than 4 days, contact our Monitor department at [phone].  If your monitor becomes loose or falls off after 4 days call [employer] at [phone] for  suggestions on securing your monitor   Important Information About Sugar         Signed, Werner Lean, MD  09/18/2021 2:11 PM    [place]

## 2021-09-18 ENCOUNTER — Encounter: Payer: Self-pay | Admitting: Internal Medicine

## 2021-09-18 ENCOUNTER — Other Ambulatory Visit: Payer: Self-pay | Admitting: Internal Medicine

## 2021-09-18 ENCOUNTER — Ambulatory Visit (INDEPENDENT_AMBULATORY_CARE_PROVIDER_SITE_OTHER): Payer: 59 | Admitting: Internal Medicine

## 2021-09-18 ENCOUNTER — Ambulatory Visit (INDEPENDENT_AMBULATORY_CARE_PROVIDER_SITE_OTHER): Payer: 59

## 2021-09-18 VITALS — BP 110/56 | HR 52 | Ht 67.0 in | Wt 145.8 lb

## 2021-09-18 DIAGNOSIS — I421 Obstructive hypertrophic cardiomyopathy: Secondary | ICD-10-CM

## 2021-09-18 DIAGNOSIS — R42 Dizziness and giddiness: Secondary | ICD-10-CM

## 2021-09-18 MED ORDER — FUROSEMIDE 20 MG PO TABS: 20.0000 mg | ORAL_TABLET | Freq: Every day | ORAL | 6 refills | Status: DC | PRN

## 2021-09-18 NOTE — Progress Notes (Unsigned)
Enrolled for [employer] to mail a ZIO XT long term holter monitor to the patients address on file.  

## 2021-09-18 NOTE — Patient Instructions (Signed)
Medication Instructions:  Your physician has recommended you make the following change in your medication:  STOP: dyazide START: furosemide (Lasix) 20 mg by mouth daily as needed for swelling/ weight gain *If you need a refill on your cardiac medications before your next appointment, please call your pharmacy*   Lab Work: NONE If you have labs (blood work) drawn today and your tests are completely normal, you will receive your results only by: [place] (if you have MyChart) OR A paper copy in the mail If you have any lab test that is abnormal or we need to change your treatment, we will call you to review the results.   Testing/Procedures: Your physician has requested that you have a stress echocardiogram. For further information please visit [email or web address]. Please follow instruction sheet as given.   Your physician has requested that you wear a 3 day heart monitor.    Follow-Up: At [facility], you and your health needs are our priority.  As part of our continuing mission to provide you with exceptional heart care, we have created designated Provider Care Teams.  These Care Teams include your primary Cardiologist (physician) and Advanced Practice Providers (APPs -  Physician Assistants and Nurse Practitioners) who all work together to provide you with the care you need, when you need it.  We recommend signing up for the patient portal called "MyChart".  Sign up information is provided on this After Visit Summary.  MyChart is used to connect with patients for Virtual Visits (Telemedicine).  Patients are able to view lab/test results, encounter notes, upcoming appointments, etc.  Non-urgent messages can be sent to your provider as well.   To learn more about what you can do with MyChart, go to [email or web address].    Your next appointment:   2 -3  month(s)  The format for your next appointment:   In Person  Provider:   Rudean Haskell, MD  Other  Instructions [place] Monitor Instructions  Your physician has requested you wear a ZIO patch monitor for 14 days.  This is a single patch monitor. [employer] supplies one patch monitor per enrollment. Additional stickers are not available. Please do not apply patch if you will be having a Nuclear Stress Test,  Echocardiogram, Cardiac CT, MRI, or Chest Xray during the period you would be wearing the  monitor. The patch cannot be worn during these tests. You cannot remove and re-apply the  ZIO XT patch monitor.  Your ZIO patch monitor will be mailed 3 day USPS to your address on file. It may take 3-5 days  to receive your monitor after you have been enrolled.  Once you have received your monitor, please review the enclosed instructions. Your monitor  has already been registered assigning a specific monitor serial # to you.  Billing and Patient Assistance Program Information  We have supplied [employer] with any of your insurance information on file for billing purposes. [employer] offers a sliding scale Patient Assistance Program for patients that do not have  insurance, or whose insurance does not completely cover the cost of the ZIO monitor.  You must apply for the Patient Assistance Program to qualify for this discounted rate.  To apply, please call [employer] at [phone], select option 4, select option 2, ask to apply for  Patient Assistance Program. Theodore Demark will ask your household income, and how many people  are in your household. They will quote your out-of-pocket cost based on that information.  [employer] will also be able to  set up a 24-month interest-free payment plan if needed.  Applying the monitor   Shave hair from upper left chest.  Hold abrader disc by orange tab. Rub abrader in 40 strokes over the upper left chest as  indicated in your monitor instructions.  Clean area with 4 enclosed alcohol pads. Let dry.  Apply patch as indicated in monitor instructions. Patch will be  placed under collarbone on left  side of chest with arrow pointing upward.  Rub patch adhesive wings for 2 minutes. Remove white label marked "1". Remove the white  label marked "2". Rub patch adhesive wings for 2 additional minutes.  While looking in a mirror, press and release button in center of patch. A small green light will  flash 3-4 times. This will be your only indicator that the monitor has been turned on.  Do not shower for the first 24 hours. You may shower after the first 24 hours.  Press the button if you feel a symptom. You will hear a small click. Record Date, Time and  Symptom in the Patient Logbook.  When you are ready to remove the patch, follow instructions on the last 2 pages of Patient  Logbook. Stick patch monitor onto the last page of Patient Logbook.  Place Patient Logbook in the blue and white box. Use locking tab on box and tape box closed  securely. The blue and white box has prepaid postage on it. Please place it in the mailbox as  soon as possible. Your physician should have your test results approximately 7 days after the  [facility]  Call [employer] [phone] you have questions regarding  your ZIO XT patch monitor. Call them immediately if you see an orange light blinking on your  monitor.  If your monitor falls off in less than 4 days, contact our Monitor department at [phone]  If your monitor becomes loose or falls off after 4 days call [employer] at [phone]  suggestions on securing your monitor   Important Information About Sugar

## 2021-09-19 ENCOUNTER — Other Ambulatory Visit (HOSPITAL_COMMUNITY): Payer: Self-pay | Admitting: Nurse Practitioner

## 2021-09-19 DIAGNOSIS — I739 Peripheral vascular disease, unspecified: Secondary | ICD-10-CM

## 2021-09-21 DIAGNOSIS — R42 Dizziness and giddiness: Secondary | ICD-10-CM

## 2021-09-21 DIAGNOSIS — I421 Obstructive hypertrophic cardiomyopathy: Secondary | ICD-10-CM | POA: Diagnosis not present

## 2021-09-25 ENCOUNTER — Other Ambulatory Visit: Payer: Self-pay | Admitting: Nurse Practitioner

## 2021-09-25 ENCOUNTER — Ambulatory Visit (HOSPITAL_COMMUNITY)
Admission: RE | Admit: 2021-09-25 | Discharge: 2021-09-25 | Disposition: A | Discharge status: Home / Self Care | Payer: 59 | Source: Ambulatory Visit | Attending: Cardiology | Admitting: Cardiology

## 2021-09-25 ENCOUNTER — Ambulatory Visit (HOSPITAL_BASED_OUTPATIENT_CLINIC_OR_DEPARTMENT_OTHER)
Admission: RE | Admit: 2021-09-25 | Discharge: 2021-09-25 | Disposition: A | Discharge status: Home / Self Care | Payer: 59 | Source: Ambulatory Visit | Attending: Cardiology | Admitting: Cardiology

## 2021-09-25 DIAGNOSIS — Z8673 Personal history of transient ischemic attack (TIA), and cerebral infarction without residual deficits: Secondary | ICD-10-CM

## 2021-09-25 DIAGNOSIS — I739 Peripheral vascular disease, unspecified: Secondary | ICD-10-CM | POA: Diagnosis present

## 2021-09-25 DIAGNOSIS — R6889 Other general symptoms and signs: Secondary | ICD-10-CM

## 2021-09-26 NOTE — Progress Notes (Unsigned)
 3 MONTH FOLLOW UP  Assessment and Plan:  Diagnoses and all orders for this visit:   Atherosclerosis of aorta Per CT 07/2018 Control blood pressure, cholesterol, glucose, increase exercise.   Essential hypertension Continue medication Monitor blood pressure at home; call if consistently over 130/80 Continue to recommend DASH diet.   Reminder to go to the ER if any CP, SOB, nausea, dizziness, severe HA, changes vision/speech, left arm numbness and tingling and jaw pain.  Atherosclerosis of native coronary artery of native heart without angina pectoris Continue to follow with Cardiology, last saw Dr. Percival Spanish in 02/2021, has appt scheduled 07/26/21 Quit smoking - patient has cut down to 4-5 a day Continue plavix   Hx of CVA Recommended she quit smoking, continue plavix, statin.   Hypothyroidism, unspecified type continue medications the same reminded to take on an empty stomach 30-49mns before food.  -     TSH  [place] Continue keppra; recommended she quit smoking but unwilling at this time. Monitor levels.  [facility]; last in 2012 per patient  - Keppra level  Stage 4 CKD  [facility] -     CMP/GFR -     Urinalysis, Complete (81001) -     Microalbumin / creatinine urine ratio  Anemia due to CKD ([facility] is managing -  Check CBC  Hyperlipidemia Continue medications LDL goal <70 Continue to recommend low cholesterol diet and exercise.  -     Lipid panel  SMOKER Continues to smoke 4-5 cigarettes a day- 45+ pack year history instruction/counseling given, counseled patient on the dangers of tobacco use, advised patient to stop smoking, and reviewed strategies to maximize success, Chantix gave pt nightmares. - Discussed low dose screening CT and will proceed with patient's permission, due 09/2021  Emphysema ([facility] Per imaging, recommended stop smoking Denies sx; monitor    Vitamin D deficiency Will check value and plan supplementation based on result -     VITAMIN D 25 Hydroxy (Vit-D Deficiency, Fractures)  Abnormal Glucose Discussed disease and risks Discussed diet/exercise, weight management  -     Hemoglobin A1c  Medication management -     CBC with Differential/Platelet -     CMP/GFR -     Magnesium   BMI 24 Long discussion about weight loss, diet, and exercise Recommended diet heavy in fruits and veggies and low in animal meats, cheeses, and dairy products, appropriate calorie intake Discussed appropriate weight for height  Follow up at next visit   Peripheral vascular disease [place] Continue aggressive treatment of cholesterol Continue plavix Referral has been placed to vascular surgery Continue cardiology follow up -   PVD/Diminished pulses/LLE claudication Control blood pressure, cholesterol, glucose, increase exercise, on plavix  Referral has been placed to vascular surgery  Left foot drop - Completed PT last year - Has been practicing the techniques/exercises, wears foot brace to keep foot up  Poor compliance Reviewed overdue tests with patient; she agrees to complete cologuard, schedule mammogram Will continue close follow up [id number], call to verify progress in a few weeks   Discussed med's effects and SE's. Screening labs and tests as requested with regular follow-up as recommended. Over 40 minutes of exam, counseling, chart review, and complex, high level critical decision making was performed this visit.   Future Appointments  Date Time Provider [place]8/03/2021  3:30 PM WiAlycia RossettiNP GAAM-GAAIM None  10/03/2021  3:40 PM Hochrein,  Jeneen Rinks, MD CVD-NORTHLIN [facility]  10/10/2021  1:45 PM MC-CV CH ECHO 3 MC[facility]SITE3ECHO [facility]  10/10/2021  2:00 PM MC-CV CH NM1/TREAD MC-ST3NUCMED [facility]  10/26/2021  8:00 AM GI-WMC CT 1 GI-WMCCT GI-[place]  11/01/2021 11:30 AM GI-315 MR 2 GI-315MRI GI-[address]   11/28/2021 10:20 AM Werner Lean, MD CVD-[facility]  06/28/2022  3:00 PM Alycia Rossetti, NP GAAM-GAAIM None     HPI  61 y.o. Caucasian married female,  - presents for a complete physical. She has Hypothyroidism; Hyperlipidemia; SMOKER; Essential hypertension; CAD (coronary artery disease); History of CVA (cerebrovascular accident); Peripheral vascular disease ([place]); CKD (chronic kidney disease) stage 4, GFR 15-29 ml/min ([place]); Seizure disorder ([place]); Vitamin D deficiency; Poor compliance; Medication management; Overweight (BMI 25.0-29.9); Anemia in chronic kidney disease; Claudication of left lower extremity ([place]); Emphysema of lung ([place]); Aortic Atherosclerosis ([place]) by Chest CT scan in June 2020 ; Cholelithiases; Atypical nevus of right lower back; History of basal cell carcinoma (BCC); Multiple falls; Left foot drop; Elevated LFTs; Acute kidney injury ([place]); Complex renal cyst; Hypotension; Hypokalemia; Hyponatremia; History of seizure; HOCM (hypertrophic obstructive cardiomyopathy) ([place]); and Dyslipidemia on their problem list.   Married, works from home for [employer]. No children. Has a dog (5 lb mico yorkie).   No concerns today.   Hx of ruptured cerebral aneurysm in 2005 with repeated repair in 2012, hx of seizure disorder on keppra - no recent seizures - last in 2008-  Formerly followed by [facility], now following keppra levels here in office.   She is followed by Nephrology Dr. Corliss Parish due to progression to CKD IV.  she currently continues to smoke 0.5 pack a day; 45+ pack year history; discussed risks associated with smoking at length, patient is not ready to quit. She states she is very aware of risks and has quit in the past. She is aware that she needs to make this decision for her health. She has failed numerous attempts with Chantix. Down from 1.5-2 packs to 0.5 pack daily. Last CT 10/25/20 showed no concerning nodules, did show  emphysemia and aortic atherosclerosis. Recommended continued annual screening. Repeat due 09/2021  BMI is There is no height or weight on file to calculate BMI., she has been working on diet and exercise. She has a pedal exerciser at her desk and uses daily - makes small healthy choices.  Wt Readings from Last 3 Encounters:  09/18/21 145 lb 12.8 oz (66.1 kg)  07/26/21 147 lb (66.7 kg)  06/27/21 151 lb 9.6 oz (68.8 kg)   She has ASCVD with bypass in 2002, followed annually by cardiology Dr. Acie Fredrickson last 2019, overdue.   She has hx of PVD, on statin and plavix, has been reporting aching in LLE after 5 blocks.  ABI and ultrasound of femoral arteries done 09/25/21: Right:High grade stenosis vs. occlusion of the distal external iliac artery/common femoral artery. Left: Atherosclerosis in the common femoral, femoral, popliteal and tibial arteries with no focal stenosis. The right toe-brachial index is abnormal.  The left toe-brachial index is abnormal.  Atherosclerosis in the aorta and iliac arteries.  Mid aorta is ectatic.  High grade stenosis vs. occlusion in the right external iliac artery.  Vascular consult recommended  Her blood pressure has been controlled at home, today their BP is    BP Readings from Last 3 Encounters:  09/18/21 (!) 110/56  07/26/21 (!) 92/50  06/27/21 (!) 102/58  She does workout- cycler at her desk.   She denies  chest pain, shortness of breath, dizziness.    She is on cholesterol medication (rosuvastatin 40 mg daily, zetia 10 mg daily) and denies myalgias. Her cholesterol is at goal. The cholesterol last visit was:   Lab Results  Component Value Date   CHOL 90 06/27/2021   HDL 25 (L) 06/27/2021   LDLCALC 41 06/27/2021   TRIG 161 (H) 06/27/2021   CHOLHDL 3.6 06/27/2021   She has been working on diet and exercise for glucose management, she is not on bASA (on plavix), she is on ACE/ARB and denies increased appetite, nausea, paresthesia of the feet, polydipsia,  polyuria, visual disturbances and vomiting. Last A1C in the office was:  Lab Results  Component Value Date   HGBA1C 5.5 06/27/2021   She has CKD IV and is following with Dr. Corliss Parish:   Lab Results  Component Value Date   EGFR 21 (L) 06/27/2021    Lab Results  Component Value Date   GFRNONAA 84 (L) 02/01/2021   Also anemia felt r/t CKD  Lab Results  Component Value Date   WBC 6.0 06/27/2021   HGB 13.2 06/27/2021   HCT 39.9 06/27/2021   MCV 94.5 06/27/2021   PLT 177 06/27/2021   She is on thyroid medication. Her medication was not changed last visit. Taking 88 mcg daily.  Lab Results  Component Value Date   TSH 0.02 (L) 06/27/2021   Patient is not currently on Vit D supplement Lab Results  Component Value Date   VD25OH 131 (H) 06/27/2021       Current Medications:  Current Outpatient Medications on File Prior to Visit  Medication Sig Dispense Refill   clopidogrel (PLAVIX) 75 MG tablet Take  1 tablet   Daily  to Prevent Blood Clots                                   /                            TAKE                          BY                     MOUTH 90 tablet 3   ezetimibe (ZETIA) 10 MG tablet TAKE 1 TABLET(10 MG) BY MOUTH DAILY 90 tablet 3   furosemide (LASIX) 20 MG tablet Take 1 tablet (20 mg total) by mouth daily as needed. 30 tablet 6   levETIRAcetam (KEPPRA) 500 MG tablet TAKE 1 TABLET BY MOUTH TWICE DAILY TO PREVENT SEIZURES 180 tablet 3   levothyroxine (SYNTHROID) 88 MCG tablet TAKE 1 TABLET BY MOUTH DAILY ON AN EMPTY STOMACH WITH ONLY WATER FOR 30 MINUTES AND NO ANTACID MEDS, CALCIUM OR MAGNESIUM FOR 4 HOURS,AVOID BIOTIN 90 tablet 0   metoprolol succinate (TOPROL-XL) 25 MG 24 hr tablet TAKE 1 TABLET(25 MG) BY MOUTH DAILY 90 tablet 3   rosuvastatin (CRESTOR) 40 MG tablet TAKE 1 TABLET BY MOUTH DAILY FOR CHOLESTEROL 90 tablet 3   No current facility-administered medications on file prior to visit.   Allergies:  Allergies  Allergen Reactions   Ace  Inhibitors Cough   Penicillins Other (See Comments)    "happened years ago" (reaction not recalled)   Prednisone Other (See Comments)    Dysphoria  Medical History:  She has Hypothyroidism; Hyperlipidemia; SMOKER; Essential hypertension; CAD (coronary artery disease); History of CVA (cerebrovascular accident); Peripheral vascular disease ([place]); CKD (chronic kidney disease) stage 4, GFR 15-29 ml/min ([place]); Seizure disorder ([place]); Vitamin D deficiency; Poor compliance; Medication management; Overweight (BMI 25.0-29.9); Anemia in chronic kidney disease; Claudication of left lower extremity ([place]); Emphysema of lung ([place]); Aortic Atherosclerosis ([place]) by Chest CT scan in June 2020 ; Cholelithiases; Atypical nevus of right lower back; History of basal cell carcinoma (BCC); Multiple falls; Left foot drop; Elevated LFTs; Acute kidney injury ([place]); Complex renal cyst; Hypotension; Hypokalemia; Hyponatremia; History of seizure; HOCM (hypertrophic obstructive cardiomyopathy) ([place]); and Dyslipidemia on their problem list. Health Maintenance:   Immunization History  Administered Date(s) Administered   Influenza Inj Mdck Quad With Preservative 01/31/2017, 12/23/2017, 01/05/2020   Influenza Split 12/02/2014   Influenza,inj,quad, With Preservative 12/20/2015   PFIZER(Purple Top)SARS-COV-2 Vaccination 07/13/2019, 08/07/2019, 04/06/2020   PPD Test 10/09/2013, 12/02/2014   Td 10/24/2006, 01/31/2017   Tetanus: 2018 Flu vaccine: 02/2019  Pap: Remote - DECLINES - discussed risks, if patient changes her mind she may request at regular visit MGM: 09/27/20 negative repeat in 1 year DEXA: get age 37  Colonoscopy: DUE - declines - agrees to cologuard but poor follow through - patient states never got, reordered last year, still hasn't completed, was ordered 10/12/20 CXR: 2015 CT chest: 08/2019, emphysema, aortic atherosclerosis CT 2020 showed cholelithiasis  Last Dental Exam: Remote - needs top teeth out - she is  planning to see an oral surgeon Last Eye Exam: Annually, last visit 2020, had cataracts out, doing well    Patient Care Team: [name], MD as PCP - General (Internal Medicine) Minus Breeding, MD as PCP - Cardiology (Cardiology) Corliss Parish, MD as Consulting Physician (Nephrology)  Surgical History:  She has a past surgical history that includes Coronary artery bypass graft (2002); Embolization (2005); Cerebral aneurysm repair (Right, 2012); Cerebral aneurysm repair (Right, 2005); and Cataract extraction, bilateral (Bilateral, 2020). Family History:  Herfamily history includes Brain cancer in her father; Heart attack in her maternal grandfather; Heart attack (age of onset: 69) in her brother; Heart attack (age of onset: 17) in her mother; Heart disease in her brother and mother; Heart disease (age of onset: 78) in her brother; Leukemia (age of onset: 50) in her sister; Multiple sclerosis in her sister. Social History:  She reports that she has been smoking cigarettes. She started smoking about 48 years ago. She has a 46.00 pack-year smoking history. She has never used smokeless tobacco. She reports that she does not drink alcohol and does not use drugs.  Review of Systems: Review of Systems  Constitutional:  Negative for malaise/fatigue and weight loss.  HENT:  Negative for hearing loss and tinnitus.   Eyes:  Negative for blurred vision and double vision.  Respiratory:  Negative for cough, shortness of breath and wheezing.   Cardiovascular:  Positive for claudication (aching, cramping in L leg with ambulation). Negative for chest pain, palpitations, orthopnea and leg swelling.  Gastrointestinal:  Negative for abdominal pain, blood in stool, constipation, diarrhea, heartburn, melena, nausea and vomiting.  Genitourinary: Negative.   Musculoskeletal:  Negative for joint pain and myalgias.  Skin:  Negative for rash.  Neurological:  Negative for dizziness, tingling (soles of  feet intermittently ), sensory change, weakness and headaches.  Endo/Heme/Allergies:  Positive for environmental allergies (She reports mild symptoms - uses OTC medications rarely as needed). Negative for polydipsia.  Psychiatric/Behavioral: Negative.    All  other systems reviewed and are negative.   Physical Exam: Estimated body mass index is 22.84 kg/m as calculated from the following:   Height as of 09/18/21: '5\' 7"'  (1.702 m).   Weight as of 09/18/21: 145 lb 12.8 oz (66.1 kg). There were no vitals taken for this visit. General Appearance: Well nourished, in no apparent distress.  Eyes: PERRLA, EOMs, conjunctiva no swelling or erythema.  Sinuses: No Frontal/maxillary tenderness  ENT/Mouth: Ext aud canals clear, normal light reflex with TMs without erythema, bulging. Extremely poor dentition - broken/missing/black teeth. NO ulcers, glands normal to palpation. No erythema, swelling, or exudate on post pharynx. Tonsils not swollen or erythematous. Hearing normal.  Neck: Supple, thyroid normal. No bruits  Respiratory: Respiratory effort normal, BS present but somewhat coarse throughout; no wheezing, stridor.  Cardio: RRR without murmurs, rubs or gallops. Thready symmetrical peripheral pulses with mild non-pitting edema  Chest: symmetric, with normal excursions and percussion.  Breasts: Declines today  Abdomen: Soft, nontender, no guarding, rebound, hernias, masses, or organomegaly.  Lymphatics: Non tender without lymphadenopathy.  Genitourinary: Patient declines Musculoskeletal: Full ROM all peripheral extremities,5/5 strength, and normal gait.  Skin: Warm, dry, scabbed area on right forearm Neuro: Cranial nerves intact, reflexes equal bilaterally. Normal muscle tone, no cerebellar symptoms. Sensation intact.  Psych: Awake and oriented X 3, normal affect, Insight and Judgment appropriate.    EKG: Sinus brady, NSCPT   Kalyn Hofstra E  8:28 AM [facility]

## 2021-09-27 ENCOUNTER — Encounter: Payer: Self-pay | Admitting: Nurse Practitioner

## 2021-09-27 ENCOUNTER — Ambulatory Visit (INDEPENDENT_AMBULATORY_CARE_PROVIDER_SITE_OTHER): Payer: 59 | Admitting: Nurse Practitioner

## 2021-09-27 VITALS — BP 92/60 | HR 50 | Temp 97.3°F | Ht 67.0 in | Wt 142.6 lb

## 2021-09-27 DIAGNOSIS — Z8673 Personal history of transient ischemic attack (TIA), and cerebral infarction without residual deficits: Secondary | ICD-10-CM

## 2021-09-27 DIAGNOSIS — E782 Mixed hyperlipidemia: Secondary | ICD-10-CM

## 2021-09-27 DIAGNOSIS — M21372 Foot drop, left foot: Secondary | ICD-10-CM

## 2021-09-27 DIAGNOSIS — I700 Atherosclerosis of aorta: Secondary | ICD-10-CM

## 2021-09-27 DIAGNOSIS — I739 Peripheral vascular disease, unspecified: Secondary | ICD-10-CM

## 2021-09-27 DIAGNOSIS — N184 Chronic kidney disease, stage 4 (severe): Secondary | ICD-10-CM | POA: Diagnosis not present

## 2021-09-27 DIAGNOSIS — R6889 Other general symptoms and signs: Secondary | ICD-10-CM

## 2021-09-27 DIAGNOSIS — Z6824 Body mass index (BMI) 24.0-24.9, adult: Secondary | ICD-10-CM

## 2021-09-27 DIAGNOSIS — F17200 Nicotine dependence, unspecified, uncomplicated: Secondary | ICD-10-CM

## 2021-09-27 DIAGNOSIS — D631 Anemia in chronic kidney disease: Secondary | ICD-10-CM

## 2021-09-27 DIAGNOSIS — G40909 Epilepsy, unspecified, not intractable, without status epilepticus: Secondary | ICD-10-CM

## 2021-09-27 DIAGNOSIS — Z91199 Patient's noncompliance with other medical treatment and regimen due to unspecified reason: Secondary | ICD-10-CM

## 2021-09-27 DIAGNOSIS — E039 Hypothyroidism, unspecified: Secondary | ICD-10-CM

## 2021-09-27 DIAGNOSIS — J432 Centrilobular emphysema: Secondary | ICD-10-CM

## 2021-09-27 DIAGNOSIS — I10 Essential (primary) hypertension: Secondary | ICD-10-CM

## 2021-09-28 ENCOUNTER — Other Ambulatory Visit: Payer: Self-pay | Admitting: Nurse Practitioner

## 2021-09-28 ENCOUNTER — Encounter: Payer: Self-pay | Admitting: Vascular Surgery

## 2021-09-28 ENCOUNTER — Ambulatory Visit (INDEPENDENT_AMBULATORY_CARE_PROVIDER_SITE_OTHER): Payer: 59 | Admitting: Vascular Surgery

## 2021-09-28 VITALS — BP 132/82 | HR 49 | Temp 98.2°F | Resp 20 | Ht 67.0 in | Wt 143.0 lb

## 2021-09-28 DIAGNOSIS — E039 Hypothyroidism, unspecified: Secondary | ICD-10-CM

## 2021-09-28 DIAGNOSIS — I739 Peripheral vascular disease, unspecified: Secondary | ICD-10-CM

## 2021-09-28 LAB — COMPLETE METABOLIC PANEL WITH GFR
AG Ratio: 1.7 (calc) (ref 1.0–2.5)
ALT: 81 U/L — ABNORMAL HIGH (ref 6–29)
AST: 93 U/L — ABNORMAL HIGH (ref 10–35)
Albumin: 4 g/dL (ref 3.6–5.1)
Alkaline phosphatase (APISO): 66 U/L (ref 37–153)
BUN/Creatinine Ratio: 13 (calc) (ref 6–22)
BUN: 37 mg/dL — ABNORMAL HIGH (ref 7–25)
CO2: 35 mmol/L — ABNORMAL HIGH (ref 20–32)
Calcium: 8.8 mg/dL (ref 8.6–10.4)
Chloride: 95 mmol/L — ABNORMAL LOW (ref 98–110)
Creat: 2.76 mg/dL — ABNORMAL HIGH (ref 0.50–1.05)
Globulin: 2.3 g/dL (calc) (ref 1.9–3.7)
Glucose, Bld: 114 mg/dL — ABNORMAL HIGH (ref 65–99)
Potassium: 3 mmol/L — ABNORMAL LOW (ref 3.5–5.3)
Sodium: 144 mmol/L (ref 135–146)
Total Bilirubin: 0.3 mg/dL (ref 0.2–1.2)
Total Protein: 6.3 g/dL (ref 6.1–8.1)
eGFR: 19 mL/min/{1.73_m2} — ABNORMAL LOW (ref >=60)

## 2021-09-28 LAB — CBC WITH DIFFERENTIAL/PLATELET
Absolute Monocytes: 720 cells/uL (ref 200–950)
Basophils Absolute: 60 cells/uL (ref 0–200)
Basophils Relative: 1 %
Eosinophils Absolute: 138 cells/uL (ref 15–500)
Eosinophils Relative: 2.3 %
HCT: 39.1 % (ref 35.0–45.0)
Hemoglobin: 13 g/dL (ref 11.7–15.5)
Lymphs Abs: 1806 cells/uL (ref 850–3900)
MCH: 32 pg (ref 27.0–33.0)
MCHC: 33.2 g/dL (ref 32.0–36.0)
MCV: 96.3 fL (ref 80.0–100.0)
MPV: 10.9 fL (ref 7.5–12.5)
Monocytes Relative: 12 %
Neutro Abs: 3276 cells/uL (ref 1500–7800)
Neutrophils Relative %: 54.6 %
Platelets: 148 10*3/uL (ref 140–400)
RBC: 4.06 10*6/uL (ref 3.80–5.10)
RDW: 12.3 % (ref 11.0–15.0)
Total Lymphocyte: 30.1 %
WBC: 6 10*3/uL (ref 3.8–10.8)

## 2021-09-28 LAB — LIPID PANEL
Cholesterol: 85 mg/dL (ref <200)
HDL: 23 mg/dL — ABNORMAL LOW (ref >=50)
LDL Cholesterol (Calc): 37 mg/dL (calc)
Non-HDL Cholesterol (Calc): 62 mg/dL (calc) (ref <130)
Total CHOL/HDL Ratio: 3.7 (calc) (ref <5.0)
Triglycerides: 169 mg/dL — ABNORMAL HIGH (ref <150)

## 2021-09-28 LAB — TSH: TSH: 0.01 mIU/L — ABNORMAL LOW (ref 0.40–4.50)

## 2021-09-28 MED ORDER — LEVOTHYROXINE SODIUM 75 MCG PO TABS: 75.0000 ug | ORAL_TABLET | Freq: Every day | ORAL | 11 refills | Status: DC

## 2021-09-28 MED ORDER — POTASSIUM CHLORIDE CRYS ER 10 MEQ PO TBCR: 10.0000 meq | EXTENDED_RELEASE_TABLET | Freq: Every day | ORAL | 3 refills | Status: DC

## 2021-09-28 NOTE — Progress Notes (Signed)
ASSESSMENT & PLAN   RIGHT ILIAC ARTERY OCCLUSIVE DISEASE: This patient's physical exam and noninvasive studies suggest iliac artery occlusive disease on the right.  However she does not have any claudication, rest pain, or nonhealing wounds.  She has paresthesias on both feet despite normal circulation on the left.  Thus I think this is more likely related to neuropathy.  I would only consider arteriography if she developed disabling claudication, rest pain, or nonhealing ulcer on the right.  We have discussed this option if her symptoms progress.  We have discussed the importance of tobacco cessation.  I encouraged her to walk is much as possible.  We also discussed the importance of nutrition.  I have ordered follow-up ABIs in 1 year and I will see her back at that time.  She knows to call sooner if she has problems.  REASON FOR CONSULT:    Abnormal ABIs.  The consult is requested by Lorenda Peck, NP.  HPI:   Olivia Walsh is a 61 y.o. female who was referred for evaluation of peripheral arterial disease.  She had paresthesias on the bottom of both feet which prompted some noninvasive studies that were done on 09/25/2021.  This showed abnormal flow in the right leg and she was sent for vascular consultation.  On my history I do not get any history of hip thigh or calf claudication on either side.  She denies any history of rest pain.  She denies any history of nonhealing wounds.  Her main complaint is paresthesias on the plantar aspect of both feet.  This has been going on for about a year.  She has undergone previous CABG in 1999 vein was taken from the left leg.  Past Medical History:  Diagnosis Date   Aneurysm ([place])    CAD (coronary artery disease) 2002   hx CABG 2002, Cath 2011 w/ occluded native vessels but LIMA-LAD, SVG-pLAD, SVG-Diag, SVG-OM patent, SVG-RCA occluded   CKD (chronic kidney disease) stage 3, GFR 30-59 ml/min (HCC)    History of basal cell carcinoma excision  12/20/2017   Hyperlipidemia LDL goal <70    Hypertension    Hypothyroidism    Tobacco abuse    Vitamin D deficiency     Family History  Problem Relation Age of Onset   Brain cancer Father        brain   Leukemia Sister 4   Heart disease Mother    Heart attack Mother 1   Heart disease Brother    Heart attack Brother 24   Heart attack Maternal Grandfather    Heart disease Brother 45   Multiple sclerosis Sister     SOCIAL HISTORY: Social History   Tobacco Use   Smoking status: Every Day    Packs/day: 1.00    Years: 46.00    Total pack years: 46.00    Types: Cigarettes    Start date: 1975   Smokeless tobacco: Never   Tobacco comments:    1/2-2 ppd   Substance Use Topics   Alcohol use: No    Comment: rarely, 1-2 per year    Allergies  Allergen Reactions   Ace Inhibitors Cough   Penicillins Other (See Comments)    "happened years ago" (reaction not recalled)   Prednisone Other (See Comments)    Dysphoria    Current Outpatient Medications  Medication Sig Dispense Refill   clopidogrel (PLAVIX) 75 MG tablet Take  1 tablet   Daily  to Prevent Blood Clots                                   /  TAKE                          BY                     MOUTH 90 tablet 3   ezetimibe (ZETIA) 10 MG tablet TAKE 1 TABLET(10 MG) BY MOUTH DAILY 90 tablet 3   furosemide (LASIX) 20 MG tablet Take 1 tablet (20 mg total) by mouth daily as needed. 30 tablet 6   levETIRAcetam (KEPPRA) 500 MG tablet TAKE 1 TABLET BY MOUTH TWICE DAILY TO PREVENT SEIZURES 180 tablet 3   levothyroxine (SYNTHROID) 75 MCG tablet Take 1 tablet (75 mcg total) by mouth daily. 30 tablet 11   metoprolol succinate (TOPROL-XL) 25 MG 24 hr tablet TAKE 1 TABLET(25 MG) BY MOUTH DAILY 90 tablet 3   potassium chloride (KLOR-CON M) 10 MEQ tablet Take 1 tablet (10 mEq total) by mouth daily. 90 tablet 3   rosuvastatin (CRESTOR) 40 MG tablet TAKE 1 TABLET BY MOUTH DAILY FOR CHOLESTEROL 90 tablet 3    No current facility-administered medications for this visit.    REVIEW OF SYSTEMS:  '[X]'$  denotes positive finding, '[ ]'$  denotes negative finding Cardiac  Comments:  Chest pain or chest pressure:    Shortness of breath upon exertion:    Short of breath when lying flat:    Irregular heart rhythm:        Vascular    Pain in calf, thigh, or hip brought on by ambulation:    Pain in feet at night that wakes you up from your sleep:     Blood clot in your veins:    Leg swelling:         Pulmonary    Oxygen at home:    Productive cough:     Wheezing:         Neurologic    Sudden weakness in arms or legs:     Sudden numbness in arms or legs:     Sudden onset of difficulty speaking or slurred speech:    Temporary loss of vision in one eye:     Problems with dizziness:         Gastrointestinal    Blood in stool:     Vomited blood:         Genitourinary    Burning when urinating:     Blood in urine:        Psychiatric    Major depression:         Hematologic    Bleeding problems:    Problems with blood clotting too easily:        Skin    Rashes or ulcers:        Constitutional    Fever or chills:    -  PHYSICAL EXAM:   Vitals:   09/28/21 1308  BP: 132/82  Pulse: (!) 49  Resp: 20  Temp: 98.2 F (36.8 C)  SpO2: 97%  Weight: 143 lb (64.9 kg)  Height: '5\' 7"'$  (1.702 m)   Body mass index is 22.4 kg/m. GENERAL: The patient is a well-nourished female, in no acute distress. The vital signs are documented above. CARDIAC: There is a regular rate and rhythm.  VASCULAR: I do not detect carotid bruits. On the right side I cannot palpate a femoral pulse or distal pulses. On the left side she has a palpable femoral pulse.  I cannot palpate pedal pulses. She  has mild bilateral lower extremity swelling. PULMONARY: There is good air exchange bilaterally without wheezing or rales. ABDOMEN: Soft and non-tender with normal pitched bowel sounds.  I do not palpate any  aneurysm. MUSCULOSKELETAL: There are no major deformities. NEUROLOGIC: No focal weakness or paresthesias are detected. SKIN: There are no ulcers or rashes noted. PSYCHIATRIC: The patient has a normal affect.  DATA:    ARTERIAL DUPLEX: I have reviewed the arterial duplex scan of both lower extremities that was done on 09/25/2021.  On the right side there were monophasic signals from the common femoral artery to the foot.  This would suggest inflow disease on the right.  On the left side there were biphasic signals throughout the entire left lower extremity.  AORTOILIAC DUPLEX: I have reviewed the aortoiliac duplex that was done on 09/25/2021.  This showed atherosclerosis in the aorta and iliac arteries bilaterally.  There was a high-grade stenosis versus a possible occlusion in the right external iliac artery.  ARTERIAL DOPPLER STUDY: I have reviewed the arterial Doppler study that was done on 09/25/2021.  On the right side there was a monophasic dorsalis pedis and posterior tibial signal.  ABI was 63%.  Toe pressure was 69 mmHg.  On the left side there was a biphasic posterior tibial and dorsalis pedis signal with an ABI of 89% and a toe pressure of 105 mmHg.  Deitra Mayo [facility]

## 2021-10-01 NOTE — Progress Notes (Signed)
Cardiology Office Note   Date:  10/03/2021   ID:  Olivia Walsh, DOB Dec 28, 1960, MRN [id number]  PCP:  [name], MD  Cardiologist:   Minus Breeding, MD   Chief Complaint  Patient presents with   Coronary Artery Disease      History of Present Illness: Olivia Walsh is a 61 y.o. female who presents for follow up of CAD s/p CABG at age 56, HTN, HLD, tobacco abuse, hypothyroidism and cerebral aneurysms with prior coiling and subarachnoid hemorrhage 2005, PVD with bilateral iliac stents 2011(per notes from [facility], pt denies) and seizure disorder.   Since I last saw her she had an MRI demonstrating severe asymmetric septal hypertrophy of 16 mm.  She had SAM with MR moderate.  I sent her to Dr. Gasper Sells who is reiterated that she should wean off of her thiazide diuretic and who is planning a stress echocardiogram.  She is having no increased shortness of breath.  She does have fatigue.  This is not different than previous.  She does get lightheaded upon standing but she is able to stand for a minute and let this pass.  She is trying hard to quit smoking.   Past Medical History:  Diagnosis Date   Aneurysm ([place])    CAD (coronary artery disease) 2002   hx CABG 2002, Cath 2011 w/ occluded native vessels but LIMA-LAD, SVG-pLAD, SVG-Diag, SVG-OM patent, SVG-RCA occluded   CKD (chronic kidney disease) stage 3, GFR 30-59 ml/min (HCC)    History of basal cell carcinoma excision 12/20/2017   Hyperlipidemia LDL goal <70    Hypertension    Hypothyroidism    Tobacco abuse    Vitamin D deficiency     Past Surgical History:  Procedure Laterality Date   CATARACT EXTRACTION, BILATERAL Bilateral 2020   CEREBRAL ANEURYSM REPAIR Right 2012   CEREBRAL ANEURYSM REPAIR Right 2005   CORONARY ARTERY BYPASS GRAFT  2002   Dr Cyndia Bent   EMBOLIZATION  2005   Embolization of right PICA aneurysm     Current Outpatient Medications  Medication Sig Dispense Refill   clopidogrel  (PLAVIX) 75 MG tablet Take  1 tablet   Daily  to Prevent Blood Clots                                   /                            TAKE                          BY                     MOUTH 90 tablet 3   ezetimibe (ZETIA) 10 MG tablet TAKE 1 TABLET(10 MG) BY MOUTH DAILY 90 tablet 3   furosemide (LASIX) 20 MG tablet Take 1 tablet (20 mg total) by mouth daily as needed. 30 tablet 6   levETIRAcetam (KEPPRA) 500 MG tablet TAKE 1 TABLET BY MOUTH TWICE DAILY TO PREVENT SEIZURES 180 tablet 3   levothyroxine (SYNTHROID) 75 MCG tablet Take 1 tablet (75 mcg total) by mouth daily. 30 tablet 11   metoprolol succinate (TOPROL-XL) 25 MG 24 hr tablet TAKE 1 TABLET(25 MG) BY MOUTH DAILY 90 tablet 3   potassium chloride (KLOR-CON M) 10 MEQ tablet Take  1 tablet (10 mEq total) by mouth daily. 90 tablet 3   rosuvastatin (CRESTOR) 40 MG tablet TAKE 1 TABLET BY MOUTH DAILY FOR CHOLESTEROL 90 tablet 3   No current facility-administered medications for this visit.    Allergies:   Ace inhibitors, Penicillins, and Prednisone     ROS:  Please see the history of present illness.   Otherwise, review of systems are positive for none.   All other systems are reviewed and negative.    PHYSICAL EXAM: VS:  BP (!) 96/58   Pulse (!) 53   Ht '5\' 7"'$  (1.702 m)   Wt 143 lb 9.6 oz (65.1 kg)   SpO2 99%   BMI 22.49 kg/m  , BMI Body mass index is 22.49 kg/m. GENERAL:  Well appearing NECK:  No jugular venous distention, waveform within normal limits, carotid upstroke brisk and symmetric, no bruits, no thyromegaly LUNGS:  Clear to auscultation bilaterally CHEST:  Unremarkable HEART:  PMI not displaced or sustained,S1 and S2 within normal limits, no S3, no S4, no clicks, no rubs, 3 out of 6 systolic murmur increasing with the strain phase of Valsalva, no diastolic murmurs ABD:  Flat, positive bowel sounds normal in frequency in pitch, no bruits, no rebound, no guarding, no midline pulsatile mass, no hepatomegaly, no  splenomegaly EXT:  2 plus pulses upper, decreased dorsalis pedis and posterior tibialis bilateral, no edema, no cyanosis no clubbing   EKG:  EKG is not ordered today.   Recent Labs: 06/27/2021: Magnesium 2.6 09/27/2021: ALT 81; BUN 37; Creat 2.76; Hemoglobin 13.0; Platelets 148; Potassium 3.0; Sodium 144; TSH 0.01    Lipid Panel    Component Value Date/Time   CHOL 85 09/27/2021 1549   TRIG 169 (H) 09/27/2021 1549   HDL 23 (L) 09/27/2021 1549   CHOLHDL 3.7 09/27/2021 1549   VLDL 30 09/26/2016 1505   LDLCALC 37 09/27/2021 1549      Wt Readings from Last 3 Encounters:  10/03/21 143 lb 9.6 oz (65.1 kg)  09/28/21 143 lb (64.9 kg)  09/27/21 142 lb 9.6 oz (64.7 kg)      Other studies Reviewed: Additional studies/ records that were reviewed today include: [facility] notes Review of the above records demonstrates:  Please see elsewhere in the note.     ASSESSMENT AND PLAN:  HOCM: She is going to follow with the stress echo and have her follow-up with Dr. Gasper Sells.  CKD IV: Creatinine is up slightly to 2.76 from 2.49.  She is followed by nephrology.  CAD:  The patient has no new sypmtoms.  No further cardiovascular testing is indicated.  We will continue with aggressive risk reduction and meds as listed.  DYSLIPIDEMIA: LDL was 37 with an HDL of 23.  No change in therapy.   HTN: Her blood pressure is running low.  At this point no change in therapy she understands how to avoid symptomatic hypotension.   PVD:   Iliac artery occlusive disease recently evaluated by Dr. Scot Dock.  She has no resting pain or ulcers.  Continue with risk reduction.  TOBACCO: She recently tried Chantix which gave her bad dreams.  She is trying to cut back and I applaud this effort.  She is down to half pack per day.  She understands the need to quit smoking completely.  Current medicines are reviewed at length with the patient today.  The patient does not have concerns regarding medicines.  The following  changes have been made:  None  Labs/ tests ordered  today include:  None   No orders of the defined types were placed in this encounter.    Disposition:   FU with me 12 months  Signed, Minus Breeding, MD  10/03/2021 5:05 PM    [place]

## 2021-10-03 ENCOUNTER — Encounter: Payer: Self-pay | Admitting: Cardiology

## 2021-10-03 ENCOUNTER — Ambulatory Visit (INDEPENDENT_AMBULATORY_CARE_PROVIDER_SITE_OTHER): Payer: 59 | Admitting: Cardiology

## 2021-10-03 VITALS — BP 96/58 | HR 53 | Ht 67.0 in | Wt 143.6 lb

## 2021-10-03 DIAGNOSIS — I2510 Atherosclerotic heart disease of native coronary artery without angina pectoris: Secondary | ICD-10-CM

## 2021-10-03 DIAGNOSIS — I10 Essential (primary) hypertension: Secondary | ICD-10-CM

## 2021-10-03 DIAGNOSIS — E785 Hyperlipidemia, unspecified: Secondary | ICD-10-CM | POA: Diagnosis not present

## 2021-10-03 DIAGNOSIS — I421 Obstructive hypertrophic cardiomyopathy: Secondary | ICD-10-CM | POA: Diagnosis not present

## 2021-10-03 DIAGNOSIS — N184 Chronic kidney disease, stage 4 (severe): Secondary | ICD-10-CM

## 2021-10-03 NOTE — Patient Instructions (Signed)

## 2021-10-05 ENCOUNTER — Telehealth (HOSPITAL_COMMUNITY): Payer: Self-pay

## 2021-10-05 NOTE — Telephone Encounter (Signed)
Detailed instructions left on the patient's answering machine. Asked to call back with any questions. Olivia Walsh EMTP 

## 2021-10-10 ENCOUNTER — Ambulatory Visit (HOSPITAL_COMMUNITY): Payer: 59

## 2021-10-10 ENCOUNTER — Encounter (HOSPITAL_COMMUNITY): Payer: Self-pay

## 2021-10-12 ENCOUNTER — Telehealth (HOSPITAL_COMMUNITY): Payer: Self-pay | Admitting: Internal Medicine

## 2021-10-12 NOTE — Telephone Encounter (Signed)
Patient called and cancelled Stress echo for reason below:  pt cant walk. 10/09/21 [facility] to reschedule @ 11:31/LBW  10/09/2021 9:30 AM [id number] KUBAK, ELZBIETA  Cancel Rsn: Patient  Order will be removed from the stress echo WQ.

## 2021-10-26 ENCOUNTER — Ambulatory Visit
Admission: RE | Admit: 2021-10-26 | Discharge: 2021-10-26 | Disposition: A | Discharge status: Home / Self Care | Payer: 59 | Source: Ambulatory Visit | Attending: Nurse Practitioner | Admitting: Nurse Practitioner

## 2021-10-26 DIAGNOSIS — F17200 Nicotine dependence, unspecified, uncomplicated: Secondary | ICD-10-CM

## 2021-10-27 ENCOUNTER — Other Ambulatory Visit: Payer: Self-pay | Admitting: Nurse Practitioner

## 2021-10-27 DIAGNOSIS — R911 Solitary pulmonary nodule: Secondary | ICD-10-CM

## 2021-10-30 ENCOUNTER — Telehealth: Payer: Self-pay | Admitting: Internal Medicine

## 2021-10-30 NOTE — Telephone Encounter (Signed)
Called Patient to discuss Stress Test (inability to do so in the setting of Drop Foot).  Left voicemail - if patient is feeling dizzy and lightheaded, as per 10/03/21 note (Dr. Percival Spanish) would bring back in September to offer Mavacamten - if asymptomatic will see in October (visit already present)  Patient had no further questions.  Rudean Haskell, MD [place]  [address] [place], [place] [address] [phone]  11:20 AM

## 2021-10-31 NOTE — Telephone Encounter (Signed)
Left a message to call back.

## 2021-11-01 ENCOUNTER — Other Ambulatory Visit: Payer: 59

## 2021-11-02 NOTE — Telephone Encounter (Signed)
Left a message to call back.

## 2021-11-09 ENCOUNTER — Other Ambulatory Visit: Payer: 59

## 2021-11-09 DIAGNOSIS — E039 Hypothyroidism, unspecified: Secondary | ICD-10-CM

## 2021-11-10 ENCOUNTER — Other Ambulatory Visit: Payer: Self-pay | Admitting: Nurse Practitioner

## 2021-11-10 DIAGNOSIS — E039 Hypothyroidism, unspecified: Secondary | ICD-10-CM

## 2021-11-10 LAB — TSH: TSH: 0.02 mIU/L — ABNORMAL LOW (ref 0.40–4.50)

## 2021-11-10 MED ORDER — LEVOTHYROXINE SODIUM 50 MCG PO TABS: 50.0000 ug | ORAL_TABLET | Freq: Every day | ORAL | 3 refills | Status: DC

## 2021-11-22 ENCOUNTER — Other Ambulatory Visit: Payer: 59

## 2021-11-27 NOTE — Progress Notes (Signed)
Cardiology Office Note:    Date:  11/28/2021   ID:  Olivia Walsh, DOB [birth year], MRN [id number]  PCP:  [name], [place] [facility] Cardiologist:  Minus Breeding, MD     Referring MD: [name], MD   CC: [name]  History of Present Illness:    Olivia Walsh is a 61 y.o. female with a hx of oHCM (Peak gradient 102 in 2022, moderate MR, septal thickness 16 mm, no LGE). 2023: She had light-headedness, fatigue and did not feel well.  Deferred CMIs but wanted to try stress testing (has drop foot) for symptoms burden.  Was unable to do testing.  Patient notes no SOB doing mild thing (driveway).  She is worried about falling and not getting back up. Notes that she still has fatigue. Notes no palpitations Notes no CP. Notes she still has standing dizziness. Notes no syncope. Sister is still doing ok (still dealing with MS).  Notable family events include: MGF- died 32s at work suddenly. Mother died in sleep Older brother died in 8s in hot tub NOS Younger brother died in 36s in his sleep Sister is alive No kids  Past Medical History:  Diagnosis Date   Aneurysm ([place])    CAD (coronary artery disease) 2002   hx CABG 2002, Cath 2011 w/ occluded native vessels but LIMA-LAD, SVG-pLAD, SVG-Diag, SVG-OM patent, SVG-RCA occluded   CKD (chronic kidney disease) stage 3, GFR 30-59 ml/min (HCC)    History of basal cell carcinoma excision 12/20/2017   Hyperlipidemia LDL goal <70    Hypertension    Hypothyroidism    Tobacco abuse    Vitamin D deficiency     Past Surgical History:  Procedure Laterality Date   CATARACT EXTRACTION, BILATERAL Bilateral 2020   CEREBRAL ANEURYSM REPAIR Right 2012   CEREBRAL ANEURYSM REPAIR Right 2005   CORONARY ARTERY BYPASS GRAFT  2002   Dr Cyndia Bent   EMBOLIZATION  2005   Embolization of right PICA aneurysm    Current Medications: Current Meds  Medication Sig   clopidogrel (PLAVIX) 75 MG tablet Take  1  tablet   Daily  to Prevent Blood Clots                                   /                            TAKE                          BY                     MOUTH   ezetimibe (ZETIA) 10 MG tablet TAKE 1 TABLET(10 MG) BY MOUTH DAILY   furosemide (LASIX) 20 MG tablet Take 1 tablet (20 mg total) by mouth daily as needed.   levETIRAcetam (KEPPRA) 500 MG tablet TAKE 1 TABLET BY MOUTH TWICE DAILY TO PREVENT SEIZURES   levothyroxine (SYNTHROID) 50 MCG tablet Take 1 tablet (50 mcg total) by mouth daily before breakfast.   Mavacamten 5 MG CAPS Take 5 mg by mouth daily.   metoprolol succinate (TOPROL-XL) 25 MG 24 hr tablet TAKE 1 TABLET(25 MG) BY MOUTH DAILY   potassium chloride (KLOR-CON M) 10 MEQ tablet Take 1 tablet (10 mEq total) by mouth daily.   rosuvastatin (  CRESTOR) 40 MG tablet TAKE 1 TABLET BY MOUTH DAILY FOR CHOLESTEROL     Allergies:   Ace inhibitors, Penicillins, and Prednisone   Social History   Socioeconomic History   Marital status: Married    Spouse name: Not on file   Number of children: 0   Years of education: Not on file   Highest education level: Not on file  Occupational History   Not on file  Tobacco Use   Smoking status: Every Day    Packs/day: 1.00    Years: 46.00    Total pack years: 46.00    Types: Cigarettes    Start date: 51   Smokeless tobacco: Never   Tobacco comments:    1/2-2 ppd   Vaping Use   Vaping Use: Never used  Substance and Sexual Activity   Alcohol use: No    Comment: rarely, 1-2 per year   Drug use: No   Sexual activity: Yes    Partners: Male    Birth control/protection: Post-menopausal  Other Topics Concern   Not on file  Social History Narrative   Not on file   Social Determinants of Health   Financial Resource Strain: Not on file  Food Insecurity: Not on file  Transportation Needs: Not on file  Physical Activity: Inactive (04/02/2018)   Exercise Vital Sign    Days of Exercise per Week: 0 days    Minutes of Exercise per Session:  0 min  Stress: Stress Concern Present (04/02/2018)   [place]    Feeling of Stress : To some extent  Social Connections: Not on file     Family History: The patient's family history includes Brain cancer in her father; Heart attack in her maternal grandfather; Heart attack (age of onset: 22) in her brother; Heart attack (age of onset: 36) in her mother; Heart disease in her brother and mother; Heart disease (age of onset: 56) in her brother; Leukemia (age of onset: 60) in her sister; Multiple sclerosis in her sister.  ROS:   Please see the history of present illness.     All other systems reviewed and are negative.  EKGs/Labs/Other Studies Reviewed:    Recent Labs: 06/27/2021: Magnesium 2.6 09/27/2021: ALT 81; BUN 37; Creat 2.76; Hemoglobin 13.0; Platelets 148; Potassium 3.0; Sodium 144 11/09/2021: TSH 0.02  Recent Lipid Panel    Component Value Date/Time   CHOL 85 09/27/2021 1549   TRIG 169 (H) 09/27/2021 1549   HDL 23 (L) 09/27/2021 1549   CHOLHDL 3.7 09/27/2021 1549   VLDL 30 09/26/2016 1505   LDLCALC 37 09/27/2021 1549        Physical Exam:    VS:  BP (!) 110/58   Pulse (!) 54   Ht '5\' 7"'$  (1.702 m)   Wt 148 lb 9.6 oz (67.4 kg)   SpO2 97%   BMI 23.27 kg/m     Wt Readings from Last 3 Encounters:  11/28/21 148 lb 9.6 oz (67.4 kg)  10/03/21 143 lb 9.6 oz (65.1 kg)  09/28/21 143 lb (64.9 kg)    Gen: No distress  Neck: No JVD Cardiac: No Rubs or Gallops, harsh systolic ejection murmur at rest and with standing, regular bradycardia Respiratory: Clear to auscultation bilaterally, normal effort, normal  respiratory rate GI: Soft, nontender, non-distended  Integument: Skin feels warm Neuro:  At time of evaluation, alert and oriented to person/place/time/situation  Psych: Normal affect, patient feels OK  ASSESSMENT:  1. S/P CABG (coronary artery bypass graft)   2. Obstructive hypertrophic cardiomyopathy  (HCC)     PLAN:    Hypertrophic Cardiomyopathy - with presents for discussion of mavacamten and myosin modulation medication - Gradient type: On BB and diuretics she has a severe LVOT gradient diuretics have been down titrated to as minimal as possible (PRN) - NYHA Class II, gradient is 106, LVEF 65% - we have let her listen to heart murmur today - No notable CYP modulation medications - Insurance is [facility] - we attempted to modify her traditional medications for CMI; she is still symptomatic - she has prior CABG and prior LAD disease, she is a poor candidate for ASA or SRT. - discussed the benefits in quality of life and fitness (pVO2), and discussed the benefits of starting mavacamten - discussed the REMS program:  will need to strictly keep up with medication and supplement checks due to potential interactions - if critical illness (Sepsis, AF RVR, new HFrEF) medication will be stopped and we will see the patient in follow up for potential safe restart - Patient will need an echo (Limited for LVEF, MR, and HOCM- [employer]) every 4 weeks until titrated at 12 weeks; then 12 week follow up (schedule now) - will see see her in 13 weeks - medications that need augmented: none  Summary: Start Mavacamten 5 mg  Queries for team based triage: Dosage forms and strength: 2.5 mg- light purple 5 mg (starting dose)- yellow  10 mg- pink 15 mg- gray (not used presently)  We have reviewed that dizziness or feeling funny within the first two weeks of this medication is common and related to the medication starting to improve her heart    Time Spent Directly with Patient:   I have spent a total of 40 minutes with the patient reviewing notes, imaging, EKGs, labs and examining the patient as well as establishing an assessment and plan that was discussed personally with the patient.  > 50% of time was spent in direct patient care.    Medication Adjustments/Labs and Tests  Ordered: Current medicines are reviewed at length with the patient today.  Concerns regarding medicines are outlined above.  Orders Placed This Encounter  Procedures   ECHOCARDIOGRAM LIMITED   ECHOCARDIOGRAM LIMITED   ECHOCARDIOGRAM LIMITED   Meds ordered this encounter  Medications   Mavacamten 5 MG CAPS    Sig: Take 5 mg by mouth daily.    Dispense:  35 capsule    Refill:  0    Patient Instructions  Medication Instructions:  Your physician has recommended you make the following change in your medication:  START: mavacamten (Camzyos) 5 mg by mouth once daily Projected to start December 04, 2021 *If you need a refill on your cardiac medications before your next appointment, please call your pharmacy*   Lab Work: NONE If you have labs (blood work) drawn today and your tests are completely normal, you will receive your results only by: [place] (if you have MyChart) OR A paper copy in the mail If you have any lab test that is abnormal or we need to change your treatment, we will call you to review the results.   Testing/Procedures: You will need an Echocardiogram 4 weeks, 8 weeks and 12 week after starting mavacamten (Camzyos).  A scheduler will call you to set up appointments.   Your physician has requested that you have an echocardiogram. Echocardiography is a painless test that uses sound waves to  create images of your heart. It provides your doctor with information about the size and shape of your heart and how well your heart's chambers and valves are working. This procedure takes approximately one hour. There are no restrictions for this procedure.    Follow-Up: At [facility], you and your health needs are our priority.  As part of our continuing mission to provide you with exceptional heart care, we have created designated Provider Care Teams.  These Care Teams include your primary Cardiologist (physician) and Advanced Practice Providers (APPs -  Physician  Assistants and Nurse Practitioners) who all work together to provide you with the care you need, when you need it.  We recommend signing up for the patient portal called "MyChart".  Sign up information is provided on this After Visit Summary.  MyChart is used to connect with patients for Virtual Visits (Telemedicine).  Patients are able to view lab/test results, encounter notes, upcoming appointments, etc.  Non-urgent messages can be sent to your provider as well.   To learn more about what you can do with MyChart, go to [email or web address].    Your next appointment:   13 week(s)  The format for your next appointment:   In Person  Provider:   Rudean Haskell, MD    Other Instructions   Important Information About Sugar         Signed, Werner Lean, MD  11/28/2021 10:56 AM    [place]

## 2021-11-28 ENCOUNTER — Ambulatory Visit
Admission: RE | Admit: 2021-11-28 | Discharge: 2021-11-28 | Disposition: A | Discharge status: Home / Self Care | Payer: 59 | Source: Ambulatory Visit | Attending: Urology | Admitting: Urology

## 2021-11-28 ENCOUNTER — Encounter: Payer: Self-pay | Admitting: Internal Medicine

## 2021-11-28 ENCOUNTER — Ambulatory Visit: Payer: 59 | Attending: Internal Medicine | Admitting: Internal Medicine

## 2021-11-28 ENCOUNTER — Other Ambulatory Visit: Payer: Self-pay | Admitting: *Deleted

## 2021-11-28 VITALS — BP 110/58 | HR 54 | Ht 67.0 in | Wt 148.6 lb

## 2021-11-28 DIAGNOSIS — I421 Obstructive hypertrophic cardiomyopathy: Secondary | ICD-10-CM

## 2021-11-28 DIAGNOSIS — Z951 Presence of aortocoronary bypass graft: Secondary | ICD-10-CM

## 2021-11-28 DIAGNOSIS — N281 Cyst of kidney, acquired: Secondary | ICD-10-CM

## 2021-11-28 MED ORDER — MAVACAMTEN 5 MG PO CAPS: 5.0000 mg | ORAL_CAPSULE | Freq: Every day | ORAL | 0 refills | Status: DC

## 2021-11-28 MED ORDER — GADOPICLENOL 0.5 MMOL/ML IV SOLN
7.0000 mL | Freq: Once | INTRAVENOUS | Status: AC | PRN
  Administered 2021-11-28: 7 mL via INTRAVENOUS

## 2021-11-28 NOTE — Telephone Encounter (Signed)
Received a fax from [place] by [facility] stating that they do not carry Mavacamten.

## 2021-11-28 NOTE — Patient Instructions (Signed)
Medication Instructions:  Your physician has recommended you make the following change in your medication:  START: mavacamten (Camzyos) 5 mg by mouth once daily Projected to start December 04, 2021 *If you need a refill on your cardiac medications before your next appointment, please call your pharmacy*   Lab Work: NONE If you have labs (blood work) drawn today and your tests are completely normal, you will receive your results only by: [place] (if you have MyChart) OR A paper copy in the mail If you have any lab test that is abnormal or we need to change your treatment, we will call you to review the results.   Testing/Procedures: You will need an Echocardiogram 4 weeks, 8 weeks and 12 week after starting mavacamten (Camzyos).  A scheduler will call you to set up appointments.   Your physician has requested that you have an echocardiogram. Echocardiography is a painless test that uses sound waves to create images of your heart. It provides your doctor with information about the size and shape of your heart and how well your heart's chambers and valves are working. This procedure takes approximately one hour. There are no restrictions for this procedure.    Follow-Up: At [facility], you and your health needs are our priority.  As part of our continuing mission to provide you with exceptional heart care, we have created designated Provider Care Teams.  These Care Teams include your primary Cardiologist (physician) and Advanced Practice Providers (APPs -  Physician Assistants and Nurse Practitioners) who all work together to provide you with the care you need, when you need it.  We recommend signing up for the patient portal called "MyChart".  Sign up information is provided on this After Visit Summary.  MyChart is used to connect with patients for Virtual Visits (Telemedicine).  Patients are able to view lab/test results, encounter notes, upcoming appointments, etc.   Non-urgent messages can be sent to your provider as well.   To learn more about what you can do with MyChart, go to [email or web address].    Your next appointment:   13 week(s)  The format for your next appointment:   In Person  Provider:   Rudean Haskell, MD    Other Instructions   Important Information About Sugar

## 2021-11-30 ENCOUNTER — Other Ambulatory Visit: Payer: Self-pay | Admitting: Nurse Practitioner

## 2021-11-30 ENCOUNTER — Telehealth: Payer: Self-pay | Admitting: Internal Medicine

## 2021-11-30 MED ORDER — MAVACAMTEN 5 MG PO CAPS: 5.0000 mg | ORAL_CAPSULE | Freq: Every day | ORAL | 0 refills | Status: DC

## 2021-11-30 NOTE — Telephone Encounter (Signed)
Pt c/o medication issue:  1. Name of Medication: camzyos   2. How are you currently taking this medication (dosage and times per day)?   3. Are you having a reaction (difficulty breathing--STAT)? no  4. What is your medication issue? Richie calling to follow up on an enrollment for the medication. He says they received an enrollment, but they submitted it on the old form. He states it needs to be sent on the new form which can be downloaded from: [email or web address]

## 2021-11-30 NOTE — Telephone Encounter (Signed)
PA for [employer] sent to insurance Key: [id number]

## 2021-11-30 NOTE — Telephone Encounter (Signed)
Fax came from [employer] by [facility], stating Rx sent to the wrong pharmacy. Unfortunately, they do carry or dispense this medication. Please resend to [facility] by [employer] [facility] [address] [place] [address].

## 2021-11-30 NOTE — Addendum Note (Signed)
Addended by: Precious Gilding on: 11/30/2021 08:30 AM   Modules accepted: Orders

## 2021-12-01 ENCOUNTER — Other Ambulatory Visit: Payer: Self-pay | Admitting: Internal Medicine

## 2021-12-04 ENCOUNTER — Encounter: Payer: Self-pay | Admitting: Internal Medicine

## 2021-12-04 ENCOUNTER — Telehealth: Payer: Self-pay | Admitting: Pharmacist

## 2021-12-04 NOTE — Telephone Encounter (Signed)
Pt is calling in regards to this medications. She states she still has not received this medication and would like a call back to see how to proceed with this since a schedule had been made for her. Please advise.

## 2021-12-04 NOTE — Telephone Encounter (Signed)
Camzyos initially denied. Apples sent and now approved through insurance through 06/05/22.

## 2021-12-07 MED ORDER — MAVACAMTEN 5 MG PO CAPS: 5.0000 mg | ORAL_CAPSULE | Freq: Every day | ORAL | 0 refills | Status: DC

## 2021-12-07 NOTE — Telephone Encounter (Signed)
Called pt updated on the status of Camzyos.  Advised pt to call the office if receives medication before I contact again next week.

## 2021-12-07 NOTE — Addendum Note (Signed)
Addended by: Marcelle Overlie D on: 12/07/2021 07:47 AM   Modules accepted: Orders

## 2021-12-11 NOTE — Telephone Encounter (Addendum)
Specialty pharmacy is [facility] is [phone] Patient should sign up for copay card since she has commercial insurance  She has been enrolled in echo copay assistance. Max annual benefit of $2500

## 2021-12-14 NOTE — Telephone Encounter (Signed)
Pt will start mavacamten on 12/18/21.

## 2021-12-26 ENCOUNTER — Other Ambulatory Visit (HOSPITAL_COMMUNITY): Payer: 59

## 2022-01-04 ENCOUNTER — Ambulatory Visit: Payer: 59 | Admitting: Nurse Practitioner

## 2022-01-08 ENCOUNTER — Other Ambulatory Visit: Payer: Self-pay

## 2022-01-08 MED ORDER — MAVACAMTEN 5 MG PO CAPS: 5.0000 mg | ORAL_CAPSULE | Freq: Every day | ORAL | 3 refills | Status: DC

## 2022-01-08 NOTE — Progress Notes (Unsigned)
 63 MONTH FOLLOW UP  Assessment and Plan:  Diagnoses and all orders for this visit:   Atherosclerosis of aorta Per CT 07/2018 Control blood pressure, cholesterol, glucose, increase exercise.   Essential hypertension Continue medication Monitor blood pressure at home; call if consistently over 130/80 Continue to recommend DASH diet.   Reminder to go to the ER if any CP, SOB, nausea, dizziness, severe HA, changes vision/speech, left arm numbness and tingling and jaw pain.  Atherosclerosis of native coronary artery of native heart without angina pectoris Continue to follow with Cardiology, Has appt with Dr Percival Spanish 10/03/21 Quit smoking - patient has cut down to 1/2 ppd Continue plavix   Hx of CVA Recommended she quit smoking, continue plavix, statin.   Hypothyroidism, unspecified type continue medications the same reminded to take on an empty stomach 30-66mns before food.  -     TSH  Seizure disorder ([place] Continue keppra; recommended she quit smoking but unwilling at this time. Monitor levels.  [facility]; last in [facility] per patient    Stage 4 CKD  [facility] -     CMP/GFR - CBC  Anemia due to CKD ([facility] is managing -  Check CBC  Hyperlipidemia Continue medications LDL goal <70 Continue to recommend low cholesterol diet and exercise.  -     Lipid panel  SMOKER Continues to smoke 4-5 cigarettes a day- 45+ pack year history instruction/counseling given, counseled patient on the dangers of tobacco use, advised patient to stop smoking, and reviewed strategies to maximize success, Chantix gave pt nightmares. - Discussed low dose screening CT and will proceed with patient's permission, due 10/26/21  Emphysema ([place] Per imaging, recommended stop smoking Denies sx; monitor   Vitamin D deficiency Not currently on supplementation  Abnormal Glucose Discussed disease and risks Discussed  diet/exercise, weight management  -     Hemoglobin A1c  Medication management -     CBC with Differential/Platelet -     CMP/GFR -     Magnesium   BMI 24 Long discussion about weight loss, diet, and exercise Recommended diet heavy in fruits and veggies and low in animal meats, cheeses, and dairy products, appropriate calorie intake Discussed appropriate weight for height  Follow up at next visit   Peripheral vascular disease ([place] Continue aggressive treatment of cholesterol Continue plavix Referral has been placed to vascular surgery, has evaluation tomorrow   PVD/Diminished pulses/LLE claudication Control blood pressure, cholesterol, glucose, increase exercise, on plavix  Referral has been placed to vascular surgery, has appointment tomorrow  Left foot drop - Completed PT last year - Has been practicing the techniques/exercises, wears foot brace to keep foot up     Discussed med's effects and SE's. Screening labs and tests as requested with regular follow-up as recommended. Over 40 minutes of exam, counseling, chart review, and complex, high level critical decision making was performed this visit.   Future Appointments  Date Time Provider [place] 01/09/2022  3:30 PM WAlycia Rossetti NP [facility] None  01/15/2022 10:20 AM [facility] 2 [facility]  02/12/2022 10:20 AM [facility] ECHO 2 [facility]  03/16/2022 11:20 AM [facility] ECHO 2 [facility]  06/28/2022  3:00 PM WAlycia Rossetti NP [facility] None     HPI  61y.o. Caucasian married female,  - presents for a complete physical. She has Hypothyroidism; Hyperlipidemia; SMOKER; Essential hypertension; CAD (coronary artery disease); History of CVA (  cerebrovascular accident); Peripheral vascular disease ([place]); CKD (chronic kidney disease) stage 4, GFR 15-29 ml/min ([place]); Seizure disorder ([place]); Vitamin D deficiency; Poor compliance; Medication management; Overweight  (BMI 25.0-29.9); Anemia in chronic kidney disease; Claudication of left lower extremity ([place]); Emphysema of lung ([place]); Aortic Atherosclerosis ([place]) by Chest CT scan in June 2020 ; Cholelithiases; Atypical nevus of right lower back; History of basal cell carcinoma (BCC); Multiple falls; Left foot drop; Elevated LFTs; Acute kidney injury ([place]); Complex renal cyst; Hypotension; Hypokalemia; Hyponatremia; History of seizure; HOCM (hypertrophic obstructive cardiomyopathy) ([place]); Dyslipidemia; and S/P CABG (coronary artery bypass graft) on their problem list.   Married, got laid off from Work in May. No children. Has a dog (5 lb mico yorkie).    Hx of ruptured cerebral aneurysm in 2005 with repeated repair in 2012, hx of seizure disorder on keppra - no recent seizures - last in 63-  Formerly followed by [facility], now following keppra levels here in office.   She is followed by Nephrology Dr. Corliss Parish due to progression to CKD IV.  she currently continues to smoke 0.5 pack a day; 45+ pack year history; discussed risks associated with smoking at length, patient is not ready to quit. She states she is very aware of risks and has quit in the past. She is aware that she needs to make this decision for her health. She has failed numerous attempts with Chantix. Down from 1.5-2 packs to 0.5 pack daily. Last CT 10/25/20 showed no concerning nodules, did show emphysemia and aortic atherosclerosis. Recommended continued annual screening. Repeat due 09/2021  BMI is There is no height or weight on file to calculate BMI., she has been working on diet and exercise. She is still eating as much as she has been was recently started on Lasix.  Wt Readings from Last 3 Encounters:  11/28/21 148 lb 9.6 oz (67.4 kg)  10/03/21 143 lb 9.6 oz (65.1 kg)  09/28/21 143 lb (64.9 kg)   She has ASCVD with bypass in 2002, followed by Dr. Percival Spanish. Was evaluated by Dr. Gasper Sells and started on Lasix 20 mg  daily and had a 3 day event monitor she just returned on Monday.  A stress test is planned in the near future. Since starting Lasix she has noticed her blood pressure has been low, gets dizzy with standing.  Denies chest pain, shortness of breath.   She has hx of PVD, on statin and plavix, has been reporting aching in LLE after 5 blocks.  ABI and ultrasound of femoral arteries done 09/25/21: Right:High grade stenosis vs. occlusion of the distal external iliac artery/common femoral artery. Left: Atherosclerosis in the common femoral, femoral, popliteal and tibial arteries with no focal stenosis. The right toe-brachial index is abnormal.  The left toe-brachial index is abnormal.  Atherosclerosis in the aorta and iliac arteries.  Mid aorta is ectatic.  High grade stenosis vs. occlusion in the right external iliac artery.  Vascular consult recommended  Her blood pressure has been controlled at home, today their BP is    BP Readings from Last 3 Encounters:  11/28/21 (!) 110/58  10/03/21 (!) 96/58  09/28/21 132/82  She does workout- cycler at her desk.   She denies chest pain, shortness of breath, dizziness.    She is on cholesterol medication (rosuvastatin 40 mg daily, zetia 10 mg daily) and denies myalgias. Her cholesterol is at goal. The cholesterol last visit was:   Lab Results  Component Value Date   CHOL  85 09/27/2021   HDL 23 (L) 09/27/2021   LDLCALC 37 09/27/2021   TRIG 169 (H) 09/27/2021   CHOLHDL 3.7 09/27/2021   She has been working on diet and exercise for glucose management, she is not on bASA (on plavix), she is on ACE/ARB and denies increased appetite, nausea, paresthesia of the feet, polydipsia, polyuria, visual disturbances and vomiting. Last A1C in the office was:  Lab Results  Component Value Date   HGBA1C 5.5 06/27/2021   She has CKD IV and is following with Dr. Corliss Parish:   Lab Results  Component Value Date   EGFR 19 (L) 09/27/2021    Lab Results   Component Value Date   GFRNONAA 20 (L) 02/01/2021   Also anemia felt r/t CKD  Lab Results  Component Value Date   WBC 6.0 09/27/2021   HGB 13.0 09/27/2021   HCT 39.1 09/27/2021   MCV 96.3 09/27/2021   PLT 148 09/27/2021   She is on thyroid medication. Her medication was not changed last visit. Taking 88 mcg daily.  Lab Results  Component Value Date   TSH 0.02 (L) 11/09/2021   Patient is not currently on Vit D supplement Lab Results  Component Value Date   VD25OH 131 (H) 06/27/2021       Current Medications:  Current Outpatient Medications on File Prior to Visit  Medication Sig Dispense Refill   clopidogrel (PLAVIX) 75 MG tablet Take  1 tablet   Daily  to Prevent Blood Clots                                   /                            TAKE                          BY                     MOUTH 90 tablet 3   ezetimibe (ZETIA) 10 MG tablet TAKE 1 TABLET(10 MG) BY MOUTH DAILY 90 tablet 3   furosemide (LASIX) 20 MG tablet Take 1 tablet (20 mg total) by mouth daily as needed. 30 tablet 6   levETIRAcetam (KEPPRA) 500 MG tablet TAKE 1 TABLET BY MOUTH TWICE DAILY TO PREVENT SEIZURES 180 tablet 3   levothyroxine (SYNTHROID) 50 MCG tablet Take 1 tablet (50 mcg total) by mouth daily before breakfast. 30 tablet 3   Mavacamten 5 MG CAPS Take 5 mg by mouth daily. 90 capsule 3   metoprolol succinate (TOPROL-XL) 25 MG 24 hr tablet TAKE 1 TABLET(25 MG) BY MOUTH DAILY 90 tablet 3   potassium chloride (KLOR-CON M) 10 MEQ tablet Take 1 tablet (10 mEq total) by mouth daily. 90 tablet 3   rosuvastatin (CRESTOR) 40 MG tablet TAKE 1 TABLET BY MOUTH DAILY FOR CHOLESTEROL 90 tablet 3   No current facility-administered medications on file prior to visit.   Allergies:  Allergies  Allergen Reactions   Ace Inhibitors Cough   Penicillins Other (See Comments)    "happened years ago" (reaction not recalled)   Prednisone Other (See Comments)    Dysphoria   Medical History:  She has Hypothyroidism;  Hyperlipidemia; SMOKER; Essential hypertension; CAD (coronary artery disease); History of CVA (cerebrovascular accident); Peripheral vascular disease ([place]); CKD (chronic  kidney disease) stage 4, GFR 15-29 ml/min ([place]); Seizure disorder ([place]); Vitamin D deficiency; Poor compliance; Medication management; Overweight (BMI 25.0-29.9); Anemia in chronic kidney disease; Claudication of left lower extremity ([place]); Emphysema of lung ([place]); Aortic Atherosclerosis ([place]) by Chest CT scan in June 2020 ; Cholelithiases; Atypical nevus of right lower back; History of basal cell carcinoma (BCC); Multiple falls; Left foot drop; Elevated LFTs; Acute kidney injury ([place]); Complex renal cyst; Hypotension; Hypokalemia; Hyponatremia; History of seizure; HOCM (hypertrophic obstructive cardiomyopathy) ([place]); Dyslipidemia; and S/P CABG (coronary artery bypass graft) on their problem list. Health Maintenance:   Immunization History  Administered Date(s) Administered   Influenza Inj Mdck Quad With Preservative 01/31/2017, 12/23/2017, 01/05/2020   Influenza Split 12/02/2014   Influenza,inj,quad, With Preservative 12/20/2015   PFIZER(Purple Top)SARS-COV-2 Vaccination 07/13/2019, 08/07/2019, 04/06/2020   PPD Test 10/09/2013, 12/02/2014   Td 10/24/2006, 01/31/2017   Tetanus: 2018 Flu vaccine: 02/2019  Pap: Remote - DECLINES - discussed risks, if patient changes her mind she may request at regular visit MGM: 09/27/20 negative repeat in 1 year DEXA: get age 85  Colonoscopy: DUE - declines - agrees to cologuard but poor follow through - patient states never got, reordered last year, still hasn't completed, was ordered 10/12/20 CXR: 2015 CT chest: 08/2019, emphysema, aortic atherosclerosis CT 2020 showed cholelithiasis  Last Dental Exam: Remote - needs top teeth out - she is planning to see an oral surgeon Last Eye Exam: Annually, last visit 2020, had cataracts out, doing well    Patient Care Team: [name], MD as PCP -  General (Internal Medicine) Minus Breeding, MD as PCP - Cardiology (Cardiology) Corliss Parish, MD as Consulting Physician (Nephrology)  Surgical History:  She has a past surgical history that includes Coronary artery bypass graft (2002); Embolization (2005); Cerebral aneurysm repair (Right, 2012); Cerebral aneurysm repair (Right, 2005); and Cataract extraction, bilateral (Bilateral, 2020). Family History:  Herfamily history includes Brain cancer in her father; Heart attack in her maternal grandfather; Heart attack (age of onset: 5) in her brother; Heart attack (age of onset: 1) in her mother; Heart disease in her brother and mother; Heart disease (age of onset: 79) in her brother; Leukemia (age of onset: 27) in her sister; Multiple sclerosis in her sister. Social History:  She reports that she has been smoking cigarettes. She started smoking about 48 years ago. She has a 46.00 pack-year smoking history. She has never used smokeless tobacco. She reports that she does not drink alcohol and does not use drugs.  Review of Systems: Review of Systems  Constitutional:  Negative for malaise/fatigue and weight loss.  HENT:  Negative for hearing loss and tinnitus.   Eyes:  Negative for blurred vision and double vision.  Respiratory:  Negative for cough, shortness of breath and wheezing.   Cardiovascular:  Positive for claudication (aching, cramping in L leg with ambulation). Negative for chest pain, palpitations, orthopnea and leg swelling.  Gastrointestinal:  Negative for abdominal pain, blood in stool, constipation, diarrhea, heartburn, melena, nausea and vomiting.  Genitourinary: Negative.   Musculoskeletal:  Negative for joint pain and myalgias.  Skin:  Negative for rash.  Neurological:  Negative for dizziness, tingling (soles of feet intermittently ), sensory change, weakness and headaches.  Endo/Heme/Allergies:  Positive for environmental allergies (She reports mild symptoms - uses OTC  medications rarely as needed). Negative for polydipsia.  Psychiatric/Behavioral: Negative.    All other systems reviewed and are negative.   Physical Exam: Estimated body mass index is 23.27 kg/m as calculated  from the following:   Height as of 11/28/21: _0  (1.702 m).   Weight as of 11/28/21: 148 lb 9.6 oz (67.4 kg). There were no vitals taken for this visit. General Appearance: Well nourished, in no apparent distress.  Eyes: PERRLA, EOMs, conjunctiva no swelling or erythema.  Sinuses: No Frontal/maxillary tenderness  ENT/Mouth: Ext aud canals clear, normal light reflex with TMs without erythema, bulging. Extremely poor dentition - broken/missing/black teeth. NO ulcers, glands normal to palpation. No erythema, swelling, or exudate on post pharynx. Tonsils not swollen or erythematous. Hearing normal.  Neck: Supple, thyroid normal. No bruits  Respiratory: Respiratory effort normal, BS present but somewhat coarse throughout; no wheezing, stridor.  Cardio: Systolic ejection murmur noted Thready symmetrical peripheral pulses with mild non-pitting edema  Chest: symmetric, with normal excursions and percussion.  Abdomen: Soft, nontender, no guarding, rebound, hernias, masses, or organomegaly.  Lymphatics: Non tender without lymphadenopathy.  Musculoskeletal: Full ROM all peripheral extremities,5/5 strength, and normal gait.  Skin: Warm, dry, scabbed area on right forearm Neuro: Cranial nerves intact, reflexes equal bilaterally. Normal muscle tone, no cerebellar symptoms. Sensation intact.  Psych: Awake and oriented X 3, normal affect, Insight and Judgment appropriate.       Karlin Heilman E  1:11 PM [facility]

## 2022-01-09 ENCOUNTER — Ambulatory Visit (INDEPENDENT_AMBULATORY_CARE_PROVIDER_SITE_OTHER): Payer: 59 | Admitting: Nurse Practitioner

## 2022-01-09 ENCOUNTER — Encounter: Payer: Self-pay | Admitting: Nurse Practitioner

## 2022-01-09 VITALS — BP 130/80 | HR 58 | Temp 97.9°F | Resp 16 | Ht 67.0 in | Wt 149.8 lb

## 2022-01-09 DIAGNOSIS — J432 Centrilobular emphysema: Secondary | ICD-10-CM

## 2022-01-09 DIAGNOSIS — G729 Myopathy, unspecified: Secondary | ICD-10-CM

## 2022-01-09 DIAGNOSIS — E039 Hypothyroidism, unspecified: Secondary | ICD-10-CM

## 2022-01-09 DIAGNOSIS — G40909 Epilepsy, unspecified, not intractable, without status epilepticus: Secondary | ICD-10-CM

## 2022-01-09 DIAGNOSIS — M21372 Foot drop, left foot: Secondary | ICD-10-CM

## 2022-01-09 DIAGNOSIS — Z79899 Other long term (current) drug therapy: Secondary | ICD-10-CM

## 2022-01-09 DIAGNOSIS — N184 Chronic kidney disease, stage 4 (severe): Secondary | ICD-10-CM

## 2022-01-09 DIAGNOSIS — Z6824 Body mass index (BMI) 24.0-24.9, adult: Secondary | ICD-10-CM

## 2022-01-09 DIAGNOSIS — I700 Atherosclerosis of aorta: Secondary | ICD-10-CM | POA: Diagnosis not present

## 2022-01-09 DIAGNOSIS — I421 Obstructive hypertrophic cardiomyopathy: Secondary | ICD-10-CM

## 2022-01-09 DIAGNOSIS — E559 Vitamin D deficiency, unspecified: Secondary | ICD-10-CM

## 2022-01-09 DIAGNOSIS — R7309 Other abnormal glucose: Secondary | ICD-10-CM

## 2022-01-09 DIAGNOSIS — E782 Mixed hyperlipidemia: Secondary | ICD-10-CM

## 2022-01-09 DIAGNOSIS — I10 Essential (primary) hypertension: Secondary | ICD-10-CM

## 2022-01-09 DIAGNOSIS — F17200 Nicotine dependence, unspecified, uncomplicated: Secondary | ICD-10-CM

## 2022-01-09 DIAGNOSIS — D631 Anemia in chronic kidney disease: Secondary | ICD-10-CM

## 2022-01-09 DIAGNOSIS — I739 Peripheral vascular disease, unspecified: Secondary | ICD-10-CM

## 2022-01-09 NOTE — Patient Instructions (Signed)
Call cardio to make sure not side effect of med If not notify me and will order PT to evaluate   Myopathy Myopathy is a condition that causes muscles to be weak and not work well. There are many different types of myopathies. Myopathies may be passed from parent to child (inherited), or they may be caused by external factors (acquired). Inherited myopathies may cause symptoms at birth or early in life. Acquired myopathies may start suddenly at any age. There is no cure for most myopathies. What are the causes? Common causes of myopathy include: Endocrine disorders, such as thyroid disease. Metabolic disorders, which are usually inherited. Infection or inflammation of the muscles. This is often triggered by viruses or because the body's defense system (immune system) is attacking the muscles. Certain medicines, such as cholesterol-lowering medicines. In some cases, the cause is not known. What increases the risk? You are more likely to develop this condition if you: Have a family history of myopathy. Take medicines that lower cholesterol (statins). What are the signs or symptoms? Symptoms of myopathy can range from mild to severe. Common symptoms of this condition include: Muscle weakness. Cramps. Stiffness. Sudden muscle tightening (spasms). These symptoms are usually felt close to the center of the body (proximal). Depending on the type of myopathy, one muscle group may be more affected than others. In inherited myopathies, symptoms vary among family members. Other symptoms of myopathy include: Muscle pain or tenderness. Muscle weakness that gets progressively worse. Feeling tired (fatigue). Heart problems. Trouble breathing. Trouble swallowing. Double vision. How is this diagnosed? This condition is diagnosed based on your medical history and tests, which may include: Blood tests. Removal of a small piece of muscle tissue to be tested (biopsy). Electromyogram  (EMG). MRI. Electrocardiogram (ECG). Genetic testing. How is this treated? Treatment for this condition depends on the type of myopathy. Treatment may include: Over-the-counter medicines such as acetaminophen or ibuprofen. Prescription medicines, such as disease-modifying antirheumatic drugs (DMARDs) or drugs that suppress the immune system. Physical therapy. A brace to help stabilize your muscles. Follow these instructions at home: If you have a brace: Wear the brace as told by your health care provider. Remove it only as told by your health care provider. Check the skin around the brace every day. Tell your health care provider about any concerns. Loosen the brace if any part of your body tingles, becomes numb, or turns cold and blue. Keep the brace clean. If the brace is not waterproof: Do not let it get wet. Cover it with a watertight covering when you take a bath or shower. Ask your health care provider when it is safe to drive if you are wearing a brace. General instructions Take over-the-counter and prescription medicines only as told by your health care provider. Maintain a healthy weight. Follow instructions from your health care provider about eating, drinking, and physical activity. If physical therapy is prescribed, do exercises as told by your health care provider or physical therapist. Keep all follow-up visits. This is important. Contact a health care provider if: You have trouble managing your symptoms at home. You have a fever. Get help right away if: You have breathing problems. You have chest pain. These symptoms may be an emergency. Get help right away. Call 911. Do not wait to see if the symptoms will go away. Do not drive yourself to the hospital. Summary Myopathy is a condition that causes muscles to be weak and not work well. There is no cure for most myopathies.  This condition may be treated with medicines, physical therapy, and a brace to help stabilize  your muscles. This information is not intended to replace advice given to you by your health care provider. Make sure you discuss any questions you have with your health care provider. Document Revised: 01/15/2021 Document Reviewed: 01/15/2021 Elsevier Patient Education  [place].

## 2022-01-10 ENCOUNTER — Telehealth: Payer: Self-pay | Admitting: Internal Medicine

## 2022-01-10 DIAGNOSIS — R748 Abnormal levels of other serum enzymes: Secondary | ICD-10-CM

## 2022-01-10 LAB — CBC WITH DIFFERENTIAL/PLATELET
Absolute Monocytes: 646 cells/uL (ref 200–950)
Basophils Absolute: 51 cells/uL (ref 0–200)
Basophils Relative: 0.8 %
Eosinophils Absolute: 179 cells/uL (ref 15–500)
Eosinophils Relative: 2.8 %
HCT: 37 % (ref 35.0–45.0)
Hemoglobin: 12.4 g/dL (ref 11.7–15.5)
Lymphs Abs: 1299 cells/uL (ref 850–3900)
MCH: 32.4 pg (ref 27.0–33.0)
MCHC: 33.5 g/dL (ref 32.0–36.0)
MCV: 96.6 fL (ref 80.0–100.0)
MPV: 10.9 fL (ref 7.5–12.5)
Monocytes Relative: 10.1 %
Neutro Abs: 4224 cells/uL (ref 1500–7800)
Neutrophils Relative %: 66 %
Platelets: 138 10*3/uL — ABNORMAL LOW (ref 140–400)
RBC: 3.83 10*6/uL (ref 3.80–5.10)
RDW: 13 % (ref 11.0–15.0)
Total Lymphocyte: 20.3 %
WBC: 6.4 10*3/uL (ref 3.8–10.8)

## 2022-01-10 LAB — LIPID PANEL
Cholesterol: 79 mg/dL (ref <200)
HDL: 19 mg/dL — ABNORMAL LOW (ref >=50)
LDL Cholesterol (Calc): 34 mg/dL (calc)
Non-HDL Cholesterol (Calc): 60 mg/dL (calc) (ref <130)
Total CHOL/HDL Ratio: 4.2 (calc) (ref <5.0)
Triglycerides: 187 mg/dL — ABNORMAL HIGH (ref <150)

## 2022-01-10 LAB — COMPLETE METABOLIC PANEL WITH GFR
AG Ratio: 1.5 (calc) (ref 1.0–2.5)
ALT: 408 U/L — ABNORMAL HIGH (ref 6–29)
AST: 622 U/L — ABNORMAL HIGH (ref 10–35)
Albumin: 3.7 g/dL (ref 3.6–5.1)
Alkaline phosphatase (APISO): 111 U/L (ref 37–153)
BUN/Creatinine Ratio: 14 (calc) (ref 6–22)
BUN: 43 mg/dL — ABNORMAL HIGH (ref 7–25)
CO2: 29 mmol/L (ref 20–32)
Calcium: 8.6 mg/dL (ref 8.6–10.4)
Chloride: 100 mmol/L (ref 98–110)
Creat: 2.98 mg/dL — ABNORMAL HIGH (ref 0.50–1.05)
Globulin: 2.5 g/dL (calc) (ref 1.9–3.7)
Glucose, Bld: 87 mg/dL (ref 65–99)
Potassium: 4 mmol/L (ref 3.5–5.3)
Sodium: 140 mmol/L (ref 135–146)
Total Bilirubin: 0.4 mg/dL (ref 0.2–1.2)
Total Protein: 6.2 g/dL (ref 6.1–8.1)
eGFR: 17 mL/min/{1.73_m2} — ABNORMAL LOW (ref >=60)

## 2022-01-10 LAB — HEMOGLOBIN A1C
Hgb A1c MFr Bld: 5.4 % of total Hgb (ref <5.7)
Mean Plasma Glucose: 108 mg/dL
eAG (mmol/L): 6 mmol/L

## 2022-01-10 LAB — TSH: TSH: 0.09 mIU/L — ABNORMAL LOW (ref 0.40–4.50)

## 2022-01-10 NOTE — Telephone Encounter (Signed)
Pt c/o medication issue:  1. Name of Medication: Mavacamten 5 MG CAPS   2. How are you currently taking this medication (dosage and times per day)? Has not taken it this morning   3. Are you having a reaction (difficulty breathing--STAT)? Yes  4. What is your medication issue? Pt is having trouble lifting feet and arms. She states she thinks this could be due to this new medication. She says there is no pain associated with this, but is having trouble with arms and feet feeling heavy. Requesting call back.

## 2022-01-10 NOTE — Telephone Encounter (Signed)
Pt called back to add that she just talked to her PCP and has these results to give.  AST is at 622 ALT is at 408

## 2022-01-10 NOTE — Addendum Note (Signed)
Addended by: Precious Gilding on: 01/10/2022 03:48 PM   Modules accepted: Orders

## 2022-01-10 NOTE — Telephone Encounter (Signed)
Called pt in regards to elevated LFT AST 622, ALT 408.  Pt reports about 4-5 days ago legs started feeling extremely heavy.  Had to manually lift legs up.  Had OV with PCP 01/09/22 tested muscles and advised Camzyos maybe cause of symptoms.   Asked pt if takes tylenol or acetaminophen.  Says no but then says takes Goody powder 1/2 powder 2-3 times daily.  Advised that this contains tylenol.  Pt uses blue box.  Asked if takes any other medications not previously reported says no.   Pt has not taken Camzyos today.  Advised pt may need to hold rosuvastatin and Goody powders.  Will send to MD and pharm D to review.

## 2022-01-10 NOTE — Telephone Encounter (Signed)
Called pt again advised not to take [facility] Powder, rosuvastatin and zetia.  Scheduled OV with Dr. Gasper Sells for 01/15/22 at 11:30 am.  Pt had no further concerns.

## 2022-01-12 ENCOUNTER — Other Ambulatory Visit: Payer: 59

## 2022-01-15 ENCOUNTER — Ambulatory Visit: Payer: 59

## 2022-01-15 ENCOUNTER — Ambulatory Visit (HOSPITAL_COMMUNITY): Payer: 59 | Attending: Cardiology

## 2022-01-15 ENCOUNTER — Other Ambulatory Visit: Payer: Self-pay | Admitting: *Deleted

## 2022-01-15 ENCOUNTER — Encounter: Payer: Self-pay | Admitting: Internal Medicine

## 2022-01-15 ENCOUNTER — Ambulatory Visit: Payer: 59 | Attending: Internal Medicine | Admitting: Internal Medicine

## 2022-01-15 ENCOUNTER — Other Ambulatory Visit: Payer: Self-pay

## 2022-01-15 VITALS — BP 130/60 | HR 53 | Ht 67.0 in | Wt 150.0 lb

## 2022-01-15 DIAGNOSIS — R748 Abnormal levels of other serum enzymes: Secondary | ICD-10-CM | POA: Diagnosis not present

## 2022-01-15 DIAGNOSIS — I421 Obstructive hypertrophic cardiomyopathy: Secondary | ICD-10-CM | POA: Diagnosis present

## 2022-01-15 DIAGNOSIS — I739 Peripheral vascular disease, unspecified: Secondary | ICD-10-CM | POA: Diagnosis not present

## 2022-01-15 DIAGNOSIS — R7401 Elevation of levels of liver transaminase levels: Secondary | ICD-10-CM

## 2022-01-15 LAB — ECHOCARDIOGRAM LIMITED
Area-P 1/2: 3.21 cm2
S' Lateral: 2.4 cm

## 2022-01-15 NOTE — Progress Notes (Signed)
MD request pt have amylase and Lipase d/t elevated LFT.

## 2022-01-15 NOTE — Patient Instructions (Signed)
Medication Instructions:  Your physician recommends that you continue on your current medications as directed. Please refer to the Current Medication list given to you today.  *If you need a refill on your cardiac medications before your next appointment, please call your pharmacy*   Lab Work: NONE  If you have labs (blood work) drawn today and your tests are completely normal, you will receive your results only by: [place] (if you have MyChart) OR A paper copy in the mail If you have any lab test that is abnormal or we need to change your treatment, we will call you to review the results.   Testing/Procedures: Your physician has requested that you have a liver ultrasound.  Your physician has referred you to GI.  Your physician has referred you to Vascular Surgery.    Follow-Up:As scheduled At [facility], you and your health needs are our priority.  As part of our continuing mission to provide you with exceptional heart care, we have created designated Provider Care Teams.  These Care Teams include your primary Cardiologist (physician) and Advanced Practice Providers (APPs -  Physician Assistants and Nurse Practitioners) who all work together to provide you with the care you need, when you need it.    Provider:   Rudean Haskell, MD  Important Information About Sugar

## 2022-01-15 NOTE — Progress Notes (Signed)
Cardiology Office Note:    Date:  01/15/2022   ID:  Olivia Walsh, DOB 1960-10-26, MRN [id number]  PCP:  [name], MD   [facility] Providers Cardiologist:  Minus Breeding, MD     Referring MD: [name], MD   CC: [facility]  History of Present Illness:    Olivia Walsh is a 61 y.o. female with a hx of oHCM (Peak gradient 102 in 2022, moderate MR, septal thickness 16 mm, no LGE). 2023: She had light-headedness, fatigue and did not feel well.  Deferred CMIs but wanted to try stress testing (has drop foot) for symptoms burden.  Was unable to do testing. We started cardiac myosin inhibitors (see prior note)  Incidentally had elevated LFTs.  She cut back on her goody powder (we recommended cessation).  We temporarily stopped her statin and zetia.  Her shortness of breath and DOE have improved. Her energy is improved.  Her dizziness and near syncope improved. She had cut down to half a goody powder. She had full LFTs amylase and lipase sent this AM We reviewed her LVOT gradient - it is < 20 mg.  She has had a dramatic improvement. Her bilateral leg claudication is much worse.  It keeps her from being active and she takes goody powder for this.  Notable family events include: MGF- died 36s at work suddenly. Mother died in sleep Older brother died in 43s in hot tub NOS Younger brother died in 61s in his sleep Sister is alive No kids  Past Medical History:  Diagnosis Date   Aneurysm ([place])    CAD (coronary artery disease) 2002   hx CABG 2002, Cath 2011 w/ occluded native vessels but LIMA-LAD, SVG-pLAD, SVG-Diag, SVG-OM patent, SVG-RCA occluded   CKD (chronic kidney disease) stage 3, GFR 30-59 ml/min (HCC)    History of basal cell carcinoma excision 12/20/2017   Hyperlipidemia LDL goal <70    Hypertension    Hypothyroidism    Tobacco abuse    Vitamin D deficiency     Past Surgical History:  Procedure Laterality Date   CATARACT EXTRACTION,  BILATERAL Bilateral 2020   CEREBRAL ANEURYSM REPAIR Right 2012   CEREBRAL ANEURYSM REPAIR Right 2005   CORONARY ARTERY BYPASS GRAFT  2002   Dr Cyndia Bent   EMBOLIZATION  2005   Embolization of right PICA aneurysm    Current Medications: Current Meds  Medication Sig   clopidogrel (PLAVIX) 75 MG tablet Take  1 tablet   Daily  to Prevent Blood Clots                                   /                            TAKE                          BY                     MOUTH   furosemide (LASIX) 20 MG tablet Take 1 tablet (20 mg total) by mouth daily as needed.   levETIRAcetam (KEPPRA) 500 MG tablet TAKE 1 TABLET BY MOUTH TWICE DAILY TO PREVENT SEIZURES   levothyroxine (SYNTHROID) 50 MCG tablet Take 1 tablet (50 mcg total) by mouth daily before breakfast.   Mavacamten 5  MG CAPS Take 5 mg by mouth daily.   metoprolol succinate (TOPROL-XL) 25 MG 24 hr tablet TAKE 1 TABLET(25 MG) BY MOUTH DAILY   potassium chloride (KLOR-CON M) 10 MEQ tablet Take 1 tablet (10 mEq total) by mouth daily.     Allergies:   Ace inhibitors, Penicillins, and Prednisone   Social History   Socioeconomic History   Marital status: Married    Spouse name: Not on file   Number of children: 0   Years of education: Not on file   Highest education level: Not on file  Occupational History   Not on file  Tobacco Use   Smoking status: Every Day    Packs/day: 1.00    Years: 46.00    Total pack years: 46.00    Types: Cigarettes    Start date: 60   Smokeless tobacco: Never   Tobacco comments:    1/2-2 ppd   Vaping Use   Vaping Use: Never used  Substance and Sexual Activity   Alcohol use: No    Comment: rarely, 1-2 per year   Drug use: No   Sexual activity: Yes    Partners: Male    Birth control/protection: Post-menopausal  Other Topics Concern   Not on file  Social History Narrative   Not on file   Social Determinants of Health   Financial Resource Strain: Not on file  Food Insecurity: Not on file   Transportation Needs: Not on file  Physical Activity: Inactive (04/02/2018)   Exercise Vital Sign    Days of Exercise per Week: 0 days    Minutes of Exercise per Session: 0 min  Stress: Stress Concern Present (04/02/2018)   [place]    Feeling of Stress : To some extent  Social Connections: Not on file     Family History: The patient's family history includes Brain cancer in her father; Heart attack in her maternal grandfather; Heart attack (age of onset: 31) in her brother; Heart attack (age of onset: 42) in her mother; Heart disease in her brother and mother; Heart disease (age of onset: 56) in her brother; Leukemia (age of onset: 21) in her sister; Multiple sclerosis in her sister.  ROS:   Please see the history of present illness.     All other systems reviewed and are negative.  EKGs/Labs/Other Studies Reviewed:    Recent Labs: 06/27/2021: Magnesium 2.6 01/09/2022: ALT 408; BUN 43; Creat 2.98; Hemoglobin 12.4; Platelets 138; Potassium 4.0; Sodium 140; TSH 0.09  Recent Lipid Panel    Component Value Date/Time   CHOL 79 01/09/2022 1604   TRIG 187 (H) 01/09/2022 1604   HDL 19 (L) 01/09/2022 1604   CHOLHDL 4.2 01/09/2022 1604   VLDL 30 09/26/2016 1505   LDLCALC 34 01/09/2022 1604        Physical Exam:    VS:  BP 130/60   Pulse (!) 53   Ht '5\' 7"'$  (1.702 m)   Wt 150 lb (68 kg)   SpO2 98%   BMI 23.49 kg/m     Wt Readings from Last 3 Encounters:  01/15/22 150 lb (68 kg)  01/09/22 149 lb 12.8 oz (67.9 kg)  11/28/21 148 lb 9.6 oz (67.4 kg)    Gen: No distress  Neck: No JVD Cardiac: No Rubs or Gallops, systolic ejection murmur  with standing, regular bradycardia Respiratory: Clear to auscultation bilaterally, normal effort, normal  respiratory rate GI: Soft, nontender, non-distended  Integument: Skin feels  warm Neuro:  At time of evaluation, alert and oriented to person/place/time/situation  Psych:  Normal affect, patient feels OK  ASSESSMENT:    1. Elevated liver enzymes   2. Transaminitis   3. PAD (peripheral artery disease) (HCC)     PLAN:    Hypertrophic Cardiomyopathy Transaminitis CAD s/p CABG - Gradient type: On BB and diuretics she has a severe LVOT gradient diuretics have been down titrated to as minimal as possible (PRN) - NYHA Class II, gradient is 12 mm Hg, LVEF 60% - we will are pending LFTs amylase and lipase - if worsening lfts will stop mavacamten and get liver US and GI referral - if stable, will cut to mavacamten 2.5 mg and echo in 4 weeks - may  send to lipid clinic for PCSK9i discussion - need better pain control strategy; will copy PCP  Bilateral leg claudication - would benefit from more aggressive pain strategy, will refer back to her vascular surgeon     Time Spent Directly with Patient:   I have spent a total of 40 minutes with the patient reviewing notes, imaging, EKGs, labs and examining the patient as well as establishing an assessment and plan that was discussed personally with the patient.  > 50% of time was spent in direct patient care.    Medication Adjustments/Labs and Tests Ordered: Current medicines are reviewed at length with the patient today.  Concerns regarding medicines are outlined above.  Orders Placed This Encounter  Procedures   US Abdomen Limited RUQ (LIVER/GB)   Ambulatory referral to Gastroenterology   Ambulatory referral to Vascular Surgery   No orders of the defined types were placed in this encounter.   Patient Instructions  Medication Instructions:  Your physician recommends that you continue on your current medications as directed. Please refer to the Current Medication list given to you today.  *If you need a refill on your cardiac medications before your next appointment, please call your pharmacy*   Lab Work: NONE  If you have labs (blood work) drawn today and your tests are completely normal, you will  receive your results only by: [place] (if you have MyChart) OR A paper copy in the mail If you have any lab test that is abnormal or we need to change your treatment, we will call you to review the results.   Testing/Procedures: Your physician has requested that you have a liver ultrasound.  Your physician has referred you to GI.  Your physician has referred you to Vascular Surgery.    Follow-Up:As scheduled At [facility], you and your health needs are our priority.  As part of our continuing mission to provide you with exceptional heart care, we have created designated Provider Care Teams.  These Care Teams include your primary Cardiologist (physician) and Advanced Practice Providers (APPs -  Physician Assistants and Nurse Practitioners) who all work together to provide you with the care you need, when you need it.    Provider:   Rudean Haskell, MD  Important Information About Sugar         Signed, Werner Lean, MD  01/15/2022 5:24 PM    [place]

## 2022-01-16 ENCOUNTER — Encounter: Payer: Self-pay | Admitting: Nurse Practitioner

## 2022-01-16 ENCOUNTER — Encounter: Payer: Self-pay | Admitting: Gastroenterology

## 2022-01-16 ENCOUNTER — Telehealth: Payer: Self-pay | Admitting: Internal Medicine

## 2022-01-16 DIAGNOSIS — I2510 Atherosclerotic heart disease of native coronary artery without angina pectoris: Secondary | ICD-10-CM

## 2022-01-16 DIAGNOSIS — R748 Abnormal levels of other serum enzymes: Secondary | ICD-10-CM

## 2022-01-16 DIAGNOSIS — I739 Peripheral vascular disease, unspecified: Secondary | ICD-10-CM

## 2022-01-16 DIAGNOSIS — R7401 Elevation of levels of liver transaminase levels: Secondary | ICD-10-CM

## 2022-01-16 LAB — HEPATIC FUNCTION PANEL
ALT: 279 IU/L — ABNORMAL HIGH (ref 0–32)
AST: 271 IU/L — ABNORMAL HIGH (ref 0–40)
Albumin: 4 g/dL (ref 3.9–4.9)
Alkaline Phosphatase: 96 IU/L (ref 44–121)
Bilirubin Total: 0.3 mg/dL (ref 0.0–1.2)
Bilirubin, Direct: 0.11 mg/dL (ref 0.00–0.40)
Total Protein: 6.3 g/dL (ref 6.0–8.5)

## 2022-01-16 LAB — AMYLASE: Amylase: 56 U/L (ref 31–110)

## 2022-01-16 LAB — LIPASE: Lipase: 75 U/L — ABNORMAL HIGH (ref 14–72)

## 2022-01-16 MED ORDER — MAVACAMTEN 2.5 MG PO CAPS: 2.5000 mg | ORAL_CAPSULE | Freq: Every day | ORAL | 3 refills | Status: DC

## 2022-01-16 NOTE — Addendum Note (Signed)
Addended by: Precious Gilding on: 01/16/2022 11:08 AM   Modules accepted: Orders

## 2022-01-16 NOTE — Telephone Encounter (Signed)
The patient has been notified of the result and verbalized understanding.  All questions (if any) were answered. Precious Gilding, RN 01/16/2022 10:53 AM    [facility] called to expedite shipment of mavacamten 2.5 mg PO QD. Pharmacy will reach out to pt and attempt delivery for today.

## 2022-01-16 NOTE — Telephone Encounter (Signed)
Per discussion 01/15/22 left VM for patient AST improved to 271 ALT improved to 279 Lipase 75 Amylase 56 Bilirubin 0.3 Mean gradient 12 LVEF 60% ( I believe 3D underestimates function)  Assessment Improving gradient Improving sx Improving LFTs   Plan  Stop Goody Powder Needs PCP or pain management for pain control Needs Pharm D for PCSK9i consideration Mavacamten decrease to 2.5 LFTs at next echo   Rudean Haskell, MD [address]  [address] [place], [place] [address] [phone]  8:43 AM

## 2022-01-23 ENCOUNTER — Other Ambulatory Visit (HOSPITAL_COMMUNITY): Payer: 59

## 2022-01-25 ENCOUNTER — Ambulatory Visit: Payer: 59 | Admitting: Gastroenterology

## 2022-01-25 ENCOUNTER — Encounter (HOSPITAL_COMMUNITY): Payer: 59

## 2022-01-25 ENCOUNTER — Ambulatory Visit: Payer: 59

## 2022-01-25 ENCOUNTER — Encounter (HOSPITAL_COMMUNITY): Payer: Self-pay

## 2022-02-06 ENCOUNTER — Other Ambulatory Visit: Payer: Self-pay | Admitting: Cardiology

## 2022-02-06 ENCOUNTER — Other Ambulatory Visit: Payer: Self-pay | Admitting: Nurse Practitioner

## 2022-02-06 DIAGNOSIS — E039 Hypothyroidism, unspecified: Secondary | ICD-10-CM

## 2022-02-12 ENCOUNTER — Telehealth (INDEPENDENT_AMBULATORY_CARE_PROVIDER_SITE_OTHER): Payer: Self-pay | Admitting: Internal Medicine

## 2022-02-12 ENCOUNTER — Ambulatory Visit: Payer: 59 | Attending: Cardiovascular Disease

## 2022-02-12 DIAGNOSIS — F17200 Nicotine dependence, unspecified, uncomplicated: Secondary | ICD-10-CM | POA: Insufficient documentation

## 2022-02-12 DIAGNOSIS — R7989 Other specified abnormal findings of blood chemistry: Secondary | ICD-10-CM | POA: Insufficient documentation

## 2022-02-12 DIAGNOSIS — I421 Obstructive hypertrophic cardiomyopathy: Secondary | ICD-10-CM | POA: Diagnosis present

## 2022-02-12 DIAGNOSIS — E785 Hyperlipidemia, unspecified: Secondary | ICD-10-CM | POA: Diagnosis present

## 2022-02-12 DIAGNOSIS — I2510 Atherosclerotic heart disease of native coronary artery without angina pectoris: Secondary | ICD-10-CM | POA: Insufficient documentation

## 2022-02-12 LAB — ECHOCARDIOGRAM LIMITED
Area-P 1/2: 2.9 cm2
MV M vel: 4.41 m/s
MV Peak grad: 77.8 mmHg
S' Lateral: 2.3 cm

## 2022-02-12 NOTE — Telephone Encounter (Incomplete)
LVEF 60% Valsalva Gradient 9 mm Hg  Patient feels ***  Shared Decision Making Trial off therapy  and Echo in 4 weeks;  LFTs

## 2022-02-13 ENCOUNTER — Ambulatory Visit: Payer: 59 | Attending: Internal Medicine

## 2022-02-13 ENCOUNTER — Ambulatory Visit (INDEPENDENT_AMBULATORY_CARE_PROVIDER_SITE_OTHER): Payer: 59 | Admitting: Pharmacist

## 2022-02-13 ENCOUNTER — Other Ambulatory Visit: Payer: Self-pay

## 2022-02-13 DIAGNOSIS — F17200 Nicotine dependence, unspecified, uncomplicated: Secondary | ICD-10-CM

## 2022-02-13 DIAGNOSIS — E785 Hyperlipidemia, unspecified: Secondary | ICD-10-CM | POA: Diagnosis not present

## 2022-02-13 DIAGNOSIS — R7989 Other specified abnormal findings of blood chemistry: Secondary | ICD-10-CM

## 2022-02-13 DIAGNOSIS — I2510 Atherosclerotic heart disease of native coronary artery without angina pectoris: Secondary | ICD-10-CM | POA: Diagnosis not present

## 2022-02-13 DIAGNOSIS — R7401 Elevation of levels of liver transaminase levels: Secondary | ICD-10-CM

## 2022-02-13 DIAGNOSIS — R748 Abnormal levels of other serum enzymes: Secondary | ICD-10-CM

## 2022-02-13 DIAGNOSIS — I739 Peripheral vascular disease, unspecified: Secondary | ICD-10-CM

## 2022-02-13 LAB — HEPATIC FUNCTION PANEL
ALT: 15 IU/L (ref 0–32)
AST: 22 IU/L (ref 0–40)
Albumin: 4.1 g/dL (ref 3.9–4.9)
Alkaline Phosphatase: 90 IU/L (ref 44–121)
Bilirubin Total: 0.2 mg/dL (ref 0.0–1.2)
Bilirubin, Direct: <0.1 mg/dL (ref 0.00–0.40)
Total Protein: 6.4 g/dL (ref 6.0–8.5)

## 2022-02-13 LAB — LIPID PANEL
Chol/HDL Ratio: 3 ratio (ref 0.0–4.4)
Cholesterol, Total: 115 mg/dL (ref 100–199)
HDL: 38 mg/dL — ABNORMAL LOW (ref >39)
LDL Chol Calc (NIH): 56 mg/dL (ref 0–99)
Triglycerides: 116 mg/dL (ref 0–149)
VLDL Cholesterol Cal: 21 mg/dL (ref 5–40)

## 2022-02-13 NOTE — Progress Notes (Signed)
Patient ID: Olivia Walsh                 DOB: February 06, 1961                    MRN: [id number]      HPI: Olivia Walsh is a 61 y.o. female patient referred to lipid clinic by Dr. Gasper Sells. PMH is significant for [facility], CAD s/p CABG in 2002,  Cath 2011 w/ occluded native vessels but LIMA-LAD, SVG-pLAD, SVG-Diag, SVG-OM patent, SVG-RCA occluded, CKD, HLD, HTN, hypothyroidism, PAD. LDL-C previously well controlled on rosuvastatin '40mg'$  daily and ezetimibe '10mg'$  daily. Patient has hx of elevated LFT. Recently with significant increase is AST and ALT. She was taking a large 2-3 packets of goody powder for headaches once a day. Is now down to 1-1.5 packs per day. She appears to have chronically elevated LFT. Saw GI in Aug 2022 for AST, ALT in the 200's. Thought to be from goody powder use. She has been been using for about 5 years. States her leg pain she was experiencing has gone away. Thinks it was from getting in and out of a friends truck.   01/09/22 AST 622, ALT 408 01/15/22 AST 271, ALT 279  Reviewed options for lowering LDL cholesterol, PCSK-9 inhibitors, Discussed mechanisms of action, dosing, side effects and potential decreases in LDL cholesterol.  Also reviewed cost information and potential options for copay card.   Current Medications: none Intolerances: rosuvastatin, ezetimibe (significantly elevated LFT) Risk Factors: premature CAD, tobacco use, HTN, PAD LDL-C goal:<55  ApoB goal: <70  Diet:  Breakfast: french toast, gravy biscuit (typically goes out) Lunch: doesn't usually eat Dinner: green beans, pork chops, pinto beans, fried chicken Doesn't snack Drink: coffee (creamer), diet mountain dew, very little water  Exercise: walks around her house and yard, but no intentional exercise. Has a dropped foot, afraid to fall. When she does fall, she has a hard time getting up. Did physical therapy previously with some benefit  Family History: The patient's family history  includes Brain cancer in her father; Heart attack in her maternal grandfather; Heart attack (age of onset: 48) in her brother; Heart attack (age of onset: 81) in her mother; Heart disease in her brother and mother; Heart disease (age of onset: 4) in her brother; Leukemia (age of onset: 37) in her sister; Multiple sclerosis in her sister.   Social History: + tobacco, minimal alcohol (1 drink every 3-4 months)  Labs: Lipid Panel     Component Value Date/Time   CHOL 79 01/09/2022 1604   TRIG 187 (H) 01/09/2022 1604   HDL 19 (L) 01/09/2022 1604   CHOLHDL 4.2 01/09/2022 1604   VLDL 30 09/26/2016 1505   [place] 34 01/09/2022 1604    Past Medical History:  Diagnosis Date   Aneurysm ([place])    CAD (coronary artery disease) 2002   hx CABG 2002, Cath 2011 w/ occluded native vessels but LIMA-LAD, SVG-pLAD, SVG-Diag, SVG-OM patent, SVG-RCA occluded   Cholelithiasis    CKD (chronic kidney disease) stage 3, GFR 30-59 ml/min (HCC)    History of basal cell carcinoma excision 12/20/2017   Hyperlipidemia LDL goal <70    Hypertension    Hypothyroidism    PAD (peripheral artery disease) (HCC)    Renal cyst    Tobacco abuse    Vitamin D deficiency     Current Outpatient Medications on File Prior to Visit  Medication Sig Dispense Refill   clopidogrel (PLAVIX) 75 MG tablet  Take  1 tablet   Daily  to Prevent Blood Clots                                   /                            TAKE                          BY                     MOUTH 90 tablet 3   furosemide (LASIX) 20 MG tablet TAKE 1 TABLET(20 MG) BY MOUTH DAILY AS NEEDED 90 tablet 1   levETIRAcetam (KEPPRA) 500 MG tablet TAKE 1 TABLET BY MOUTH TWICE DAILY TO PREVENT SEIZURES 180 tablet 3   levothyroxine (SYNTHROID) 50 MCG tablet TAKE 1 TABLET(50 MCG) BY MOUTH DAILY BEFORE BREAKFAST 90 tablet 2   Mavacamten 2.5 MG CAPS Take 2.5 mg by mouth daily. 30 capsule 3   metoprolol succinate (TOPROL-XL) 25 MG 24 hr tablet TAKE 1 TABLET(25 MG) BY MOUTH  DAILY 90 tablet 3   potassium chloride (KLOR-CON M) 10 MEQ tablet Take 1 tablet (10 mEq total) by mouth daily. 90 tablet 3   No current facility-administered medications on file prior to visit.    Allergies  Allergen Reactions   Ace Inhibitors Cough   Penicillins Other (See Comments)    "happened years ago" (reaction not recalled)   Prednisone Other (See Comments)    Dysphoria    Assessment/Plan:  1. Hyperlipidemia -  Elevated LFTs Assessment: Most likely due to combination of statin, ezetimibe and good powder Previous episode, but these were by far the highest readings Saw GI in 2022- thought to possibly due to hepato injury from goody power. LFT had improved but continued to be elevated She has been off statin and zetia now for about a month- recheck today  Dyslipidemia Assessment: Chonically elevated LFT, with recently significant elevation Statin and zetia were stopped Some improvement seen, rechecking again today Avoid goody power Reviewed options for lowering LDL cholesterol, PCSK-9 inhibitors, Discussed mechanisms of action, dosing, side effects, injection technqiue and potential decreases in LDL cholesterol.  Also reviewed cost information and potential options for copay card.  Her insurance will be changing in the spring Diet is poor, patient is aware Limited exercise due to fear of falling Still smoking, not ready to quit We discussed the 5 modifiable risk factors for CVD (lipids, BP, Tobacco use, diet and exercise) BP is controlled, patient is using tobacco, not ready to quit due to stress of husbands stroke. Lipids discussed above along with diet and exercise.  Plan: Will submit PA for Repatha '140mg'$  q 14 days Encouraged patient to discuss with PCP physical therapy to help with strength and stability. Patient is fearful that when she falls she cannot get up herself-this really needs to be address with physical therapy and strength training Advised to increase more  fruit and vegetables and decrease fried foods. More water  SMOKER Assessment: Per pt, she knows it is an excuse, but mentally not ready to quit due to stress with husband post stroke. Has tried Chantix in past but didn't work bc she wasn't ready Husband quit after they found a lung mass. She smokes less now bc she doesn't smoke around him  Plan: Can provide assistance when patient is ready to quit   Thank you,  Ramond Dial, Pharm.D, BCPS, CPP [facility] [address] [place], [place] [address]  Phone: [phone]; Fax: [phone]

## 2022-02-13 NOTE — Assessment & Plan Note (Signed)
Assessment: Most likely due to combination of statin, ezetimibe and good powder Previous episode, but these were by far the highest readings Saw GI in 2022- thought to possibly due to hepato injury from goody power. LFT had improved but continued to be elevated She has been off statin and zetia now for about a month- recheck today

## 2022-02-13 NOTE — Assessment & Plan Note (Signed)
Assessment: Per pt, she knows it is an excuse, but mentally not ready to quit due to stress with husband post stroke. Has tried Chantix in past but didn't work bc she wasn't ready Husband quit after they found a lung mass. She smokes less now bc she doesn't smoke around him  Plan: Can provide assistance when patient is ready to quit

## 2022-02-13 NOTE — Patient Instructions (Addendum)
I will submit a prior authorization for Repatha. I will call you once I hear back. Please call me at [phone] with any questions.   Repatha is a cholesterol medication that improved your body's ability to get rid of "bad cholesterol" known as LDL. It can lower your LDL up to 60%! It is an injection that is given under the skin every 2 weeks. The medication often requires a prior authorization from your insurance company. We will take care of submitting all the necessary information to your insurance company to get it approved. The most common side effects of Repatha include runny nose, symptoms of the common cold, rarely flu or flu-like symptoms, back/muscle pain in about 3-4% of the patients, and redness, pain, or bruising at the injection site. Tell your healthcare provider if you have any side effect that bothers you or that does not go away.   To get your $5 Repatha copay card; [email or web address] Paying for Repatha (white bar across top).  Scroll down to "options for insurance situations" and click on "I have commercial or private insurance" - scroll down and click on the blue highlighted  "click here" to learn more about the [place] you have a prescription - Click YES then scroll down and mark the box "Repatha Copay Card", then continue to scroll down and enter the personal information. There are two blue boxes with "I agree" next to them. The first is optional if you want to enroll in their patient support program.  This one is voluntary.  The second is their patient authorization, and you must mark this one to continue.   Lastly they ask if they can contact you regarding information about market research about the drug or disease state.  You can mark either box. Click "next" and continue to follow steps to get your copay card  Adopting a Healthy Lifestyle.   Weight: Know what a healthy weight is for you (roughly BMI <25) and aim to maintain this. You can calculate your body mass  index on your smart phone  Diet: Aim for 7+ servings of fruits and vegetables daily Limit animal fats in diet for cholesterol and heart health - choose grass fed whenever available Avoid highly processed foods (fast food burgers, tacos, fried chicken, pizza, hot dogs, french fries)  Saturated fat comes in the form of butter, lard, coconut oil, margarine, partially hydrogenated oils, and fat in meat. These increase your risk of cardiovascular disease.  Use healthy plant oils, such as olive, canola, soy, corn, sunflower and peanut.  Whole foods such as fruits, vegetables and whole grains have fiber  Men need > 38 grams of fiber per day Women need > 25 grams of fiber per day  Load up on vegetables and fruits - one-half of your plate: Aim for color and variety, and remember that potatoes dont count. Go for whole grains - one-quarter of your plate: Whole wheat, barley, wheat berries, quinoa, oats, brown rice, and foods made with them. If you want pasta, go with whole wheat pasta. Protein power - one-quarter of your plate: Fish, chicken, beans, and nuts are all healthy, versatile protein sources. Limit red meat. You need carbohydrates for energy! The type of carbohydrate is more important than the amount. Choose carbohydrates such as vegetables, fruits, whole grains, beans, and nuts in the place of white rice, white pasta, potatoes (baked or fried), macaroni and cheese, cakes, cookies, and donuts.  If youre thirsty, drink water. Coffee and tea are good in  moderation, but skip sugary drinks and limit milk and dairy products to one or two daily servings. Keep sugar intake at 6 teaspoons or 24 grams or LESS       Exercise: Aim for 150 min of moderate intensity exercise weekly for heart health, and weights twice weekly for bone health Stay active - any steps are better than no steps! Aim for 7-9 hours of sleep daily

## 2022-02-13 NOTE — Assessment & Plan Note (Signed)
Assessment: Chonically elevated LFT, with recently significant elevation Statin and zetia were stopped Some improvement seen, rechecking again today Avoid goody power Reviewed options for lowering LDL cholesterol, PCSK-9 inhibitors, Discussed mechanisms of action, dosing, side effects, injection technqiue and potential decreases in LDL cholesterol.  Also reviewed cost information and potential options for copay card.  Her insurance will be changing in the spring Diet is poor, patient is aware Limited exercise due to fear of falling Still smoking, not ready to quit We discussed the 5 modifiable risk factors for CVD (lipids, BP, Tobacco use, diet and exercise) BP is controlled, patient is using tobacco, not ready to quit due to stress of husbands stroke. Lipids discussed above along with diet and exercise.  Plan: Will submit PA for Repatha '140mg'$  q 14 days Encouraged patient to discuss with PCP physical therapy to help with strength and stability. Patient is fearful that when she falls she cannot get up herself-this really needs to be address with physical therapy and strength training Advised to increase more fruit and vegetables and decrease fried foods. More water

## 2022-02-14 ENCOUNTER — Telehealth: Payer: Self-pay | Admitting: Pharmacist

## 2022-02-14 ENCOUNTER — Ambulatory Visit (HOSPITAL_BASED_OUTPATIENT_CLINIC_OR_DEPARTMENT_OTHER)
Admission: RE | Admit: 2022-02-14 | Discharge: 2022-02-14 | Disposition: A | Discharge status: Home / Self Care | Payer: 59 | Source: Ambulatory Visit | Attending: Internal Medicine | Admitting: Internal Medicine

## 2022-02-14 ENCOUNTER — Telehealth: Payer: Self-pay | Admitting: Internal Medicine

## 2022-02-14 DIAGNOSIS — R7401 Elevation of levels of liver transaminase levels: Secondary | ICD-10-CM | POA: Diagnosis present

## 2022-02-14 DIAGNOSIS — R748 Abnormal levels of other serum enzymes: Secondary | ICD-10-CM | POA: Insufficient documentation

## 2022-02-14 DIAGNOSIS — E782 Mixed hyperlipidemia: Secondary | ICD-10-CM

## 2022-02-14 NOTE — Telephone Encounter (Signed)
LFT are signifcantly improved. Oddly LDL-C is still within goal. Lab was to submit to insurance for [place]. Possibly some residual from medications although she has been off for a month. Will recheck in 1 month. 1/17

## 2022-02-14 NOTE — Telephone Encounter (Signed)
Pt c/o medication issue:  1. Name of Medication:  Camzyos  2. How are you currently taking this medication (dosage and times per day)?   3. Are you having a reaction (difficulty breathing--STAT)?   4. What is your medication issue?   Olivia Walsh with [facility] states they have received Camzyos updates on patient status form, with echo results. She states it is recommended that echo is repeated at week 12 and that will determine whether or not patient needs to continue with therapy.

## 2022-02-14 NOTE — Telephone Encounter (Signed)
ERROR.  Se other documentation

## 2022-02-20 ENCOUNTER — Other Ambulatory Visit (HOSPITAL_COMMUNITY): Payer: 59

## 2022-02-22 ENCOUNTER — Other Ambulatory Visit: Payer: Self-pay | Admitting: Nurse Practitioner

## 2022-02-22 NOTE — Telephone Encounter (Signed)
MD previously called pharmacy to follow up.  Will close out this encounter.

## 2022-03-02 ENCOUNTER — Other Ambulatory Visit: Payer: Self-pay

## 2022-03-02 DIAGNOSIS — I421 Obstructive hypertrophic cardiomyopathy: Secondary | ICD-10-CM

## 2022-03-02 NOTE — Progress Notes (Unsigned)
12 week Camzyos limited echocardiogram ordered.

## 2022-03-14 ENCOUNTER — Ambulatory Visit: Payer: 59 | Attending: Internal Medicine

## 2022-03-14 DIAGNOSIS — E782 Mixed hyperlipidemia: Secondary | ICD-10-CM

## 2022-03-14 LAB — LIPID PANEL
Chol/HDL Ratio: 4.2 ratio (ref 0.0–4.4)
Cholesterol, Total: 169 mg/dL (ref 100–199)
HDL: 40 mg/dL (ref >39)
LDL Chol Calc (NIH): 112 mg/dL — ABNORMAL HIGH (ref 0–99)
Triglycerides: 93 mg/dL (ref 0–149)
VLDL Cholesterol Cal: 17 mg/dL (ref 5–40)

## 2022-03-15 NOTE — Telephone Encounter (Signed)
Repeat LDL-C higher. Repatha PA submitted  Key: [facility]

## 2022-03-16 ENCOUNTER — Telehealth: Payer: Self-pay | Admitting: Pharmacist

## 2022-03-16 ENCOUNTER — Ambulatory Visit (HOSPITAL_COMMUNITY): Payer: 59 | Attending: Cardiovascular Disease

## 2022-03-16 ENCOUNTER — Telehealth: Payer: Self-pay

## 2022-03-16 DIAGNOSIS — I421 Obstructive hypertrophic cardiomyopathy: Secondary | ICD-10-CM | POA: Diagnosis present

## 2022-03-16 LAB — ECHOCARDIOGRAM LIMITED
AR max vel: 3.83 cm2
AV Area VTI: 3.62 cm2
AV Area mean vel: 3.65 cm2
AV Mean grad: 3.3 mmHg
AV Peak grad: 5.8 mmHg
Ao pk vel: 1.2 m/s
Area-P 1/2: 3.2 cm2
MV M vel: 4.49 m/s
MV Peak grad: 80.6 mmHg
S' Lateral: 3.3 cm

## 2022-03-16 MED ORDER — REPATHA SURECLICK 140 MG/ML ~~LOC~~ SOAJ: 1.0000 mL | SUBCUTANEOUS | 11 refills | Status: DC

## 2022-03-16 NOTE — Telephone Encounter (Signed)
The patient has been notified of the result and verbalized understanding.  All questions (if any) were answered. Ewell Poe [place], RN 03/16/2022 5:06 PM   Patient did not have any issue with medication. Patient does not have any new medications or supplement. Patient stated she is doing fine. Echocardiogram will need to be scheduled for 06/08/22.  Patient stated she had 10 day supply of camzyos 2.5 mg left. Will forward to Dr. Oralia Rud nurse to help with refills.

## 2022-03-16 NOTE — Telephone Encounter (Signed)
Repatha approved through 09/13/22. Pt made aware. Reminded to get a copay card. Rx sent to pharmacy. Will repeat labs in 2 months. Will try to coordinate with echo in March.

## 2022-03-16 NOTE — Telephone Encounter (Signed)
-----  Message from Werner Lean, MD sent at 03/16/2022  5:00 PM EST ----- Results: Peak LVOT Gradient: 10 mm Hg LVEF : 60% Plan: Mavacamten dose 2.5 mg  Here screening echos were 11, 12, and 1; we should now be able to move to 12 week echos   If Patient has new medications or supplements, will need medication review for potential interactions  Schedule non urgent follow up with me (we may attempt to decrease BB dose based on her eval and asymptomatic burden)   Werner Lean, MD

## 2022-03-16 NOTE — Telephone Encounter (Signed)
PA approved through 09/13/22

## 2022-03-19 MED ORDER — MAVACAMTEN 2.5 MG PO CAPS: 2.5000 mg | ORAL_CAPSULE | Freq: Every day | ORAL | 2 refills | Status: DC

## 2022-03-19 NOTE — Telephone Encounter (Signed)
Called pt scheduled to see MD on 03/27/22 at 3:40 pm.

## 2022-03-19 NOTE — Telephone Encounter (Signed)
Prescription for Camzyos 2.5 mg PO QD sent to [facility]. #35 with 2 refills.

## 2022-03-27 ENCOUNTER — Encounter: Payer: Self-pay | Admitting: Internal Medicine

## 2022-03-27 ENCOUNTER — Ambulatory Visit: Payer: 59 | Attending: Internal Medicine | Admitting: Internal Medicine

## 2022-03-27 VITALS — BP 138/78 | HR 46 | Ht 66.0 in | Wt 150.0 lb

## 2022-03-27 DIAGNOSIS — I421 Obstructive hypertrophic cardiomyopathy: Secondary | ICD-10-CM | POA: Diagnosis not present

## 2022-03-27 MED ORDER — MAVACAMTEN 2.5 MG PO CAPS: 2.5000 mg | ORAL_CAPSULE | Freq: Every day | ORAL | 2 refills | Status: DC

## 2022-03-27 NOTE — Progress Notes (Signed)
Cardiology Office Note:    Date:  03/27/2022   ID:  Olivia Walsh, DOB 1961-01-15, MRN [id number]  PCP:  [name], MD   [place] Providers Cardiologist:  Minus Breeding, MD     Referring MD: [name], MD   CC: [facility]  History of Present Illness:    Olivia Walsh is a 62 y.o. female with a hx of oHCM (Peak gradient 102 in 2022, moderate MR, septal thickness 16 mm, no LGE). 2023: She had light-headedness, fatigue and did not feel well.  Deferred CMIs but wanted to try stress testing (has drop foot) for symptoms burden.  Was unable to do testing. We started cardiac myosin inhibitors (see prior note)  Incidentally had elevated LFTs.  She cut back on her goody powder (we recommended cessation).  We temporarily stopped her statin and zetia. 2024: on 2.5 mg Mavacamten  Patient notes no SOB at rest and no DOE No falling since starting therapy. Notes no fatigue. Notes no palpitations Notes no CP. Notes no Dizziness. Notes no syncope.  Husband has an ablation and afterwards she is going to the gym.    Past Medical History:  Diagnosis Date   Aneurysm ([place])    CAD (coronary artery disease) 2002   hx CABG 2002, Cath 2011 w/ occluded native vessels but LIMA-LAD, SVG-pLAD, SVG-Diag, SVG-OM patent, SVG-RCA occluded   Cholelithiasis    CKD (chronic kidney disease) stage 3, GFR 30-59 ml/min (HCC)    History of basal cell carcinoma excision 12/20/2017   Hyperlipidemia LDL goal <70    Hypertension    Hypothyroidism    PAD (peripheral artery disease) ([place])    Renal cyst    Tobacco abuse    Vitamin D deficiency     Past Surgical History:  Procedure Laterality Date   CATARACT EXTRACTION, BILATERAL Bilateral 2020   CEREBRAL ANEURYSM REPAIR Right 2012   CEREBRAL ANEURYSM REPAIR Right 2005   CORONARY ARTERY BYPASS GRAFT  2002   Dr Cyndia Bent   EMBOLIZATION  2005   Embolization of right PICA aneurysm    Current Medications: Current Meds   Medication Sig   clopidogrel (PLAVIX) 75 MG tablet Take  1 tablet   Daily  to Prevent Blood Clots                                   /                            TAKE                          BY                     MOUTH   Evolocumab (REPATHA SURECLICK) 798 MG/ML SOAJ Inject 140 mg into the skin every 14 (fourteen) days.   furosemide (LASIX) 20 MG tablet TAKE 1 TABLET(20 MG) BY MOUTH DAILY AS NEEDED   levETIRAcetam (KEPPRA) 500 MG tablet TAKE 1 TABLET BY MOUTH TWICE DAILY TO PREVENT SEIZURES   levothyroxine (SYNTHROID) 50 MCG tablet TAKE 1 TABLET(50 MCG) BY MOUTH DAILY BEFORE BREAKFAST   metoprolol succinate (TOPROL-XL) 25 MG 24 hr tablet TAKE 1 TABLET(25 MG) BY MOUTH DAILY   potassium chloride (KLOR-CON M) 10 MEQ tablet Take 1 tablet (10 mEq total) by mouth daily.   [DISCONTINUED]  mavacamten 2.5 MG CAPS Take 2.5 mg by mouth daily.     Allergies:   Ace inhibitors, Penicillins, and Prednisone   Social History   Socioeconomic History   Marital status: Married    Spouse name: Not on file   Number of children: 0   Years of education: Not on file   Highest education level: Not on file  Occupational History   Not on file  Tobacco Use   Smoking status: Every Day    Packs/day: 1.00    Years: 46.00    Total pack years: 46.00    Types: Cigarettes    Start date: 72   Smokeless tobacco: Never   Tobacco comments:    1/2-2 ppd   Vaping Use   Vaping Use: Never used  Substance and Sexual Activity   Alcohol use: No    Comment: rarely, 1-2 per year   Drug use: No   Sexual activity: Yes    Partners: Male    Birth control/protection: Post-menopausal  Other Topics Concern   Not on file  Social History Narrative   Not on file   Social Determinants of Health   Financial Resource Strain: Not on file  Food Insecurity: Not on file  Transportation Needs: Not on file  Physical Activity: Inactive (04/02/2018)   Exercise Vital Sign    Days of Exercise per Week: 0 days    Minutes of Exercise  per Session: 0 min  Stress: Stress Concern Present (04/02/2018)   [place]    Feeling of Stress : To some extent  Social Connections: Not on file     Family History: The patient's family history includes Brain cancer in her father; Heart attack in her maternal grandfather; Heart attack (age of onset: 27) in her brother; Heart attack (age of onset: 33) in her mother; Heart disease in her brother and mother; Heart disease (age of onset: 38) in her brother; Leukemia (age of onset: 2) in her sister; Multiple sclerosis in her sister.  ROS:   Please see the history of present illness.     All other systems reviewed and are negative.  EKGs/Labs/Other Studies Reviewed:    Recent Labs: 06/27/2021: Magnesium 2.6 01/09/2022: BUN 43; Creat 2.98; Hemoglobin 12.4; Platelets 138; Potassium 4.0; Sodium 140; TSH 0.09 02/13/2022: ALT 15  Recent Lipid Panel    Component Value Date/Time   CHOL 169 03/14/2022 0902   TRIG 93 03/14/2022 0902   HDL 40 03/14/2022 0902   CHOLHDL 4.2 03/14/2022 0902   CHOLHDL 4.2 01/09/2022 1604   VLDL 30 09/26/2016 1505   LDLCALC 112 (H) 03/14/2022 0902   LDLCALC 34 01/09/2022 1604        Physical Exam:    VS:  BP 138/78   Pulse (!) 46   Ht '5\' 6"'$  (1.676 m)   Wt 150 lb (68 kg)   SpO2 95%   BMI 24.21 kg/m     Wt Readings from Last 3 Encounters:  03/27/22 150 lb (68 kg)  01/15/22 150 lb (68 kg)  01/09/22 149 lb 12.8 oz (67.9 kg)    Gen: No distress  Neck: No JVD Cardiac: No Rubs or Gallops, no systolic murmur, regular bradycardia Respiratory: Clear to auscultation bilaterally, normal effort, normal  respiratory rate GI: Soft, nontender, non-distended  Integument: Skin feels warm Neuro:  At time of evaluation, alert and oriented to person/place/time/situation  Psych: Normal affect, patient feels OK  ASSESSMENT:    1. Obstructive  hypertrophic cardiomyopathy (HCC)     PLAN:     Hypertrophic Cardiomyopathy Transaminitis CAD s/p CABG - Gradient type: On BB and diuretics she has a severe LVOT gradient diuretics have been down titrated to as minimal as possible (PRN) - NYHA Class I - on mavacamten 2.5 mg with 12 week f/u - transaminitis has resolved; minimized GOODY powder - sister still needs screening - low risk of SCD; will repeat resting in 2024 - no symptoms of low heart rate, if needed and decrease succinate dose  Exercise Prescription  Frequency: two days per week to start  Intensity:  60 to 90 percent of heart rate reserve. Time:  10 minutes per session with 1 min increase per session Type: biking Volume:  goal of 20 minute sessions   Five months with me 12 week echo monitoring  Time Spent Directly with Patient:   I have spent a total of 40 minutes with the patient reviewing notes, imaging, EKGs, labs and examining the patient as well as establishing an assessment and plan that was discussed personally with the patient.  > 50% of time was spent in direct patient care.    Medication Adjustments/Labs and Tests Ordered: Current medicines are reviewed at length with the patient today.  Concerns regarding medicines are outlined above.  No orders of the defined types were placed in this encounter.  Meds ordered this encounter  Medications   mavacamten 2.5 MG CAPS    Sig: Take 2.5 mg by mouth daily.    Dispense:  35 capsule    Refill:  2    Patient Instructions  Medication Instructions:  Your physician recommends that you continue on your current medications as directed. Please refer to the Current Medication list given to you today.  *If you need a refill on your cardiac medications before your next appointment, please call your pharmacy*   Lab Work: NONE If you have labs (blood work) drawn today and your tests are completely normal, you will receive your results only by: [place] (if you have MyChart) OR A paper copy in the  mail If you have any lab test that is abnormal or we need to change your treatment, we will call you to review the results.   Testing/Procedures: NONE   Follow-Up: At [facility], you and your health needs are our priority.  As part of our continuing mission to provide you with exceptional heart care, we have created designated Provider Care Teams.  These Care Teams include your primary Cardiologist (physician) and Advanced Practice Providers (APPs -  Physician Assistants and Nurse Practitioners) who all work together to provide you with the care you need, when you need it.  We recommend signing up for the patient portal called "MyChart".  Sign up information is provided on this After Visit Summary.  MyChart is used to connect with patients for Virtual Visits (Telemedicine).  Patients are able to view lab/test results, encounter notes, upcoming appointments, etc.  Non-urgent messages can be sent to your provider as well.   To learn more about what you can do with MyChart, go to [email or web address].    Your next appointment:   5 month(s)  Provider:   Rudean Haskell, MD    Signed, Werner Lean, MD  03/27/2022 5:51 PM    [place]

## 2022-03-27 NOTE — Patient Instructions (Signed)
Medication Instructions:  Your physician recommends that you continue on your current medications as directed. Please refer to the Current Medication list given to you today.  *If you need a refill on your cardiac medications before your next appointment, please call your pharmacy*   Lab Work: NONE If you have labs (blood work) drawn today and your tests are completely normal, you will receive your results only by: [place] (if you have MyChart) OR A paper copy in the mail If you have any lab test that is abnormal or we need to change your treatment, we will call you to review the results.   Testing/Procedures: NONE   Follow-Up: At [facility], you and your health needs are our priority.  As part of our continuing mission to provide you with exceptional heart care, we have created designated Provider Care Teams.  These Care Teams include your primary Cardiologist (physician) and Advanced Practice Providers (APPs -  Physician Assistants and Nurse Practitioners) who all work together to provide you with the care you need, when you need it.  We recommend signing up for the patient portal called "MyChart".  Sign up information is provided on this After Visit Summary.  MyChart is used to connect with patients for Virtual Visits (Telemedicine).  Patients are able to view lab/test results, encounter notes, upcoming appointments, etc.  Non-urgent messages can be sent to your provider as well.   To learn more about what you can do with MyChart, go to [email or web address].    Your next appointment:   5 month(s)  Provider:   Rudean Haskell, MD

## 2022-04-11 ENCOUNTER — Other Ambulatory Visit (HOSPITAL_COMMUNITY): Payer: 59

## 2022-04-12 ENCOUNTER — Ambulatory Visit: Payer: 59 | Admitting: Nurse Practitioner

## 2022-04-18 ENCOUNTER — Ambulatory Visit (INDEPENDENT_AMBULATORY_CARE_PROVIDER_SITE_OTHER): Payer: 59 | Admitting: Nurse Practitioner

## 2022-04-18 ENCOUNTER — Encounter: Payer: Self-pay | Admitting: Nurse Practitioner

## 2022-04-18 VITALS — BP 130/88 | HR 52 | Temp 97.9°F | Ht 67.0 in | Wt 149.6 lb

## 2022-04-18 DIAGNOSIS — Z8673 Personal history of transient ischemic attack (TIA), and cerebral infarction without residual deficits: Secondary | ICD-10-CM | POA: Diagnosis not present

## 2022-04-18 DIAGNOSIS — I2510 Atherosclerotic heart disease of native coronary artery without angina pectoris: Secondary | ICD-10-CM

## 2022-04-18 DIAGNOSIS — J432 Centrilobular emphysema: Secondary | ICD-10-CM

## 2022-04-18 DIAGNOSIS — I700 Atherosclerosis of aorta: Secondary | ICD-10-CM | POA: Diagnosis not present

## 2022-04-18 DIAGNOSIS — I421 Obstructive hypertrophic cardiomyopathy: Secondary | ICD-10-CM

## 2022-04-18 DIAGNOSIS — N184 Chronic kidney disease, stage 4 (severe): Secondary | ICD-10-CM | POA: Diagnosis not present

## 2022-04-18 DIAGNOSIS — G40909 Epilepsy, unspecified, not intractable, without status epilepticus: Secondary | ICD-10-CM

## 2022-04-18 DIAGNOSIS — E782 Mixed hyperlipidemia: Secondary | ICD-10-CM

## 2022-04-18 DIAGNOSIS — I739 Peripheral vascular disease, unspecified: Secondary | ICD-10-CM

## 2022-04-18 DIAGNOSIS — R7309 Other abnormal glucose: Secondary | ICD-10-CM

## 2022-04-18 DIAGNOSIS — E559 Vitamin D deficiency, unspecified: Secondary | ICD-10-CM

## 2022-04-18 DIAGNOSIS — E039 Hypothyroidism, unspecified: Secondary | ICD-10-CM

## 2022-04-18 DIAGNOSIS — Z79899 Other long term (current) drug therapy: Secondary | ICD-10-CM

## 2022-04-18 DIAGNOSIS — I10 Essential (primary) hypertension: Secondary | ICD-10-CM

## 2022-04-18 DIAGNOSIS — J309 Allergic rhinitis, unspecified: Secondary | ICD-10-CM

## 2022-04-18 DIAGNOSIS — D631 Anemia in chronic kidney disease: Secondary | ICD-10-CM

## 2022-04-18 DIAGNOSIS — F17200 Nicotine dependence, unspecified, uncomplicated: Secondary | ICD-10-CM

## 2022-04-18 MED ORDER — FLUTICASONE PROPIONATE 50 MCG/ACT NA SUSP: 2.0000 | Freq: Every day | NASAL | 2 refills | Status: AC

## 2022-04-18 MED ORDER — METOPROLOL SUCCINATE ER 25 MG PO TB24: ORAL_TABLET | ORAL | 3 refills | Status: DC

## 2022-04-18 MED ORDER — MONTELUKAST SODIUM 10 MG PO TABS: 10.0000 mg | ORAL_TABLET | Freq: Every day | ORAL | 2 refills | Status: DC

## 2022-04-18 NOTE — Patient Instructions (Signed)
Begin Singulair 10 mg everyday at bedtime  Take Flonase 2 sprays each nostril daily  Allergic Rhinitis, Adult  Allergic rhinitis is an allergic reaction that affects the mucous membrane inside the nose. The mucous membrane is the tissue that produces mucus. There are two types of allergic rhinitis: Seasonal. This type is also called hay fever and happens only during certain seasons. Perennial. This type can happen at any time of the year. Allergic rhinitis cannot be spread from person to person. This condition can be mild, moderate, or severe. It can develop at any age and may be outgrown. What are the causes? This condition is caused by allergens. These are things that can cause an allergic reaction. Allergens may differ for seasonal allergic rhinitis and perennial allergic rhinitis. Seasonal allergic rhinitis is triggered by pollen. Pollen can come from grasses, trees, and weeds. Perennial allergic rhinitis may be triggered by: Dust mites. Proteins in a pet's urine, saliva, or dander. Dander is dead skin cells from a pet. Smoke, mold, or car fumes. What increases the risk? You are more likely to develop this condition if you have a family history of allergies or other conditions related to allergies, including: Allergic conjunctivitis. This is inflammation of parts of the eyes and eyelids. Asthma. This condition affects the lungs and makes it hard to breathe. Atopic dermatitis or eczema. This is long term (chronic) inflammation of the skin. Food allergies. What are the signs or symptoms? Symptoms of this condition include: Sneezing or coughing. A stuffy nose (nasal congestion), itchy nose, or nasal discharge. Itchy eyes and tearing of the eyes. A feeling of mucus dripping down the back of your throat (postnasal drip). Trouble sleeping. Tiredness or fatigue. Headache. Sore throat. How is this diagnosed? This condition may be diagnosed with your symptoms, medical history, and  physical exam. Your health care provider may check for related conditions, such as: Asthma. Pink eye. This is eye inflammation caused by infection (conjunctivitis). Ear infection. Upper respiratory infection. This is an infection in the nose, throat, or upper airways. You may also have tests to find out which allergens trigger your symptoms. These may include skin tests or blood tests. How is this treated? There is no cure for this condition, but treatment can help control symptoms. Treatment may include: Taking medicines that block allergy symptoms, such as corticosteroids and antihistamines. Medicine may be given as a shot, nasal spray, or pill. Avoiding any allergens. Being exposed again and again to tiny amounts of allergens to help you build a defense against allergens (immunotherapy). This is done if other treatments have not helped. It may include: Allergy shots. These are injected medicines that have small amounts of allergen in them. Sublingual immunotherapy. This involves taking small doses of a medicine with allergen in it under your tongue. If these treatments do not work, your health care provider may prescribe newer, stronger medicines. Follow these instructions at home: Avoiding allergens Find out what you are allergic to and avoid those allergens. These are some things you can do to help avoid allergens: If you have perennial allergies: Replace carpet with wood, tile, or vinyl flooring. Carpet can trap dander and dust. Do not smoke. Do not allow smoking in your home. Change your heating and air conditioning filters at least once a month. If you have seasonal allergies, take these steps during allergy season: Keep windows closed as much as possible. Plan outdoor activities when pollen counts are lowest. Check pollen counts before you plan outdoor activities. When coming  indoors, change clothing and shower before sitting on furniture or bedding. If you have a pet in the house  that produces allergens: Keep the pet out of the bedroom. Vacuum, sweep, and dust regularly. General instructions Take over-the-counter and prescription medicines only as told by your health care provider. Drink enough fluid to keep your urine pale yellow. Keep all follow-up visits as told by your health care provider. This is important. Where to find more information American Academy of Allergy, Asthma & Immunology: www.aaaai.org Contact a health care provider if: You have a fever. You develop a cough that does not go away. You make whistling sounds when you breathe (wheeze). Your symptoms slow you down or stop you from doing your normal activities each day. Get help right away if: You have shortness of breath. This symptom may represent a serious problem that is an emergency. Do not wait to see if the symptom will go away. Get medical help right away. Call your local emergency services (911 in the [place] Do not drive yourself to the hospital. Summary Allergic rhinitis may be managed by taking medicines as directed and avoiding allergens. If you have seasonal allergies, keep windows closed as much as possible during allergy season. Contact your health care provider if you develop a fever or a cough that does not go away. This information is not intended to replace advice given to you by your health care provider. Make sure you discuss any questions you have with your health care provider. Document Revised: 07/27/2021 Document Reviewed: 02/10/2019 Elsevier Patient Education  [place].

## 2022-04-18 NOTE — Progress Notes (Signed)
 3 MONTH FOLLOW UP  Assessment and Plan:  Diagnoses and all orders for this visit:   Atherosclerosis of aorta Per CT 07/2018 Control blood pressure, cholesterol, glucose, increase exercise.   Essential hypertension Continue medication Monitor blood pressure at home; call if consistently over 130/80 Continue to recommend DASH diet.   Reminder to go to the ER if any CP, SOB, nausea, dizziness, severe HA, changes vision/speech, left arm numbness and tingling and jaw pain.  Atherosclerosis of native coronary artery of native heart without angina pectoris Continue to follow with Cardiology, Has appt with Dr Percival Spanish 10/03/21 Quit smoking - patient has cut down to 1/2 ppd Continue plavix   Hx of CVA Recommended she quit smoking, continue plavix, statin.   Hypothyroidism, unspecified type continue medications the same reminded to take on an empty stomach 30-54mns before food.  -     TSH  Seizure disorder ([place] Continue keppra; recommended she quit smoking but unwilling at this time. Monitor levels.  [facility] in 2012 per patient    Stage 4 CKD  [facility] -     CMP/GFR - CBC  Anemia due to CKD ([facility] is managing -  Check CBC  Hyperlipidemia Zetia and Rosuvastatin is d/c'd and is now on Repatha LDL goal <70 Continue to recommend low cholesterol diet and exercise.  -     Lipid panel  SMOKER Continues to smoke 1/2-1 ppd- 45+ pack year history instruction/counseling given, counseled patient on the dangers of tobacco use, advised patient to stop smoking, and reviewed strategies to maximize success, Chantix gave pt nightmares. She continues to try to cut back - Discussed low dose screening CT UTD last one 10/26/21 small lung nodule same size but increased density, recommend repeat in 6 months and has been ordered  Emphysema ([place] Per imaging, recommended stop smoking Denies sx; monitor    Vitamin D deficiency Not currently on supplementation - Vit D  Abnormal Glucose Discussed disease and risks Discussed diet/exercise, weight management  -     CMP  Medication management -     CBC with Differential/Platelet -     CMP/GFR   BMI 24 Long discussion about weight loss, diet, and exercise Recommended diet heavy in fruits and veggies and low in animal meats, cheeses, and dairy products, appropriate calorie intake Discussed appropriate weight for height  Follow up at next visit  HCOM(Hypertrophic Obstructive Cardiomyopathy) ([place] Follows with cardiology Recently started on Mavacamten 5 mg QD  Peripheral vascular disease ([place] Continue aggressive treatment of cholesterol Continue plavix Referral has been placed to vascular surgery, has evaluation tomorrow   PVD/Diminished pulses/LLE claudication Control blood pressure, cholesterol, glucose, increase exercise, on plavix  Referral has been placed to vascular surgery, has appointment tomorrow  Allergic rhinitis Begin Singulair and Flonase daily, run past cardiology to make sure this is ok with Mavacamten   Discussed med's effects and SE's. Screening labs and tests as requested with regular follow-up as recommended. Over 40 minutes of exam, counseling, chart review, and complex, high level critical decision making was performed this visit.   Future Appointments  Date Time Provider [place] 04/27/2022  2:40 PM GI-315 CT 1 GI-315CT GI-[address]  06/08/2022 11:35 AM MC-CV CH ECHO 5 [id number] [facility]  07/17/2022  2:00 PM WAlycia Rossetti NP GAAM-GAAIM None  09/18/2022  9:40 AM CWerner Lean MD CVD[facility]CHUSTOFF [facility]     HPI  62y.o. Caucasian  married female,  - presents for a complete physical. She has Hypothyroidism; Hyperlipidemia; SMOKER; Essential hypertension; CAD (coronary artery disease); History of CVA (cerebrovascular accident); Peripheral vascular disease ([place]); CKD (chronic  kidney disease) stage 4, GFR 15-29 ml/min ([place]); Seizure disorder ([place]); Vitamin D deficiency; Poor compliance; Medication management; Overweight (BMI 25.0-29.9); Anemia in chronic kidney disease; Claudication of left lower extremity ([place]); Emphysema of lung ([place]); Aortic Atherosclerosis ([place]) by Chest CT scan in June 2020 ; Cholelithiases; Atypical nevus of right lower back; History of basal cell carcinoma (BCC); Multiple falls; Left foot drop; Acute kidney injury ([place]); Complex renal cyst; Hypotension; Hypokalemia; Hyponatremia; History of seizure; Obstructive hypertrophic cardiomyopathy ([place]); Dyslipidemia; and S/P CABG (coronary artery bypass graft) on their problem list.   Married, got laid off from Work in May. No children. Has a dog (5 lb mico yorkie).   She has been dealing with persistent foot drop. Is about the same but has no new falls  She has been experiencing sinus congestion, nasal congestion of clear mucus.  Will have sneezing fits- symptoms have been going on x 1.5 months  Hx of ruptured cerebral aneurysm in 2005 with repeated repair in 2012, hx of seizure disorder on keppra - no recent seizures - last in 2008-  Formerly followed by [facility], now following keppra levels here in office.   She is followed by Nephrology Dr. Corliss Parish due to progression to CKD IV.  she currently continues to smoke 0.5-1 ppd ,45+ pack year history; discussed risks associated with smoking at length, patient is not ready to quit. She states she is very aware of risks and has quit in the past. She is aware that she needs to make this decision for her health. She has failed numerous attempts with Chantix. Down from 1.5-2 packs to 0.5 pack daily. Last CT 10/25/20 showed no concerning nodules, did show emphysemia and aortic atherosclerosis. Recommended continued annual screening. Repeat due 09/2022  BMI is Body mass index is 23.43 kg/m., she has been working on diet and exercise. Weight  stable Wt Readings from Last 3 Encounters:  04/18/22 149 lb 9.6 oz (67.9 kg)  03/27/22 150 lb (68 kg)  01/15/22 150 lb (68 kg)   She has ASCVD with bypass in 2002, followed by Dr. Percival Spanish. Was evaluated by Dr. Gasper Sells and started on Lasix 20 mg daily and had a 3 day event monitor she just returned on Monday.  A stress test is planned in the near future.   Denies chest pain, shortness of breath.   She has hx of PVD, on statin and plavix, has been reporting aching in LLE after 5 blocks.  ABI and ultrasound of femoral arteries done 09/25/21: Right:High grade stenosis vs. occlusion of the distal external iliac artery/common femoral artery. Left: Atherosclerosis in the common femoral, femoral, popliteal and tibial arteries with no focal stenosis. The right toe-brachial index is abnormal.  The left toe-brachial index is abnormal.  Atherosclerosis in the aorta and iliac arteries.  Mid aorta is ectatic.  High grade stenosis vs. occlusion in the right external iliac artery.  Vascular consult recommended  Dr  Gasper Sells visit 03/27/22:Hypertrophic Cardiomyopathy Hypertrophic Cardiomyopathy Transaminitis CAD s/p CABG - Gradient type: On BB and diuretics she has a severe LVOT gradient diuretics have been down titrated to as minimal as possible (PRN) - NYHA Class I - on mavacamten 2.5 mg with 12 week f/u - transaminitis has resolved; minimized GOODY powder - sister still needs screening - low risk of SCD;  will repeat resting in 2024 - no symptoms of low heart rate, if needed and decrease succinate dose Exercise Prescription Frequency: two days per week to start  Intensity:  60 to 90 percent of heart rate reserve. Time:  10 minutes per session with 1 min increase per session Type: biking Volume:  goal of 20 minute sessions Five months with me 12 week echo monitoring  Last LFT's were in normal range 02/13/22  Her blood pressure has been controlled at home, today their BP is BP:  130/88  BP Readings from Last 3 Encounters:  04/18/22 130/88  03/27/22 138/78  01/15/22 130/60  She does workout- cycler at her desk.   She denies chest pain, shortness of breath, dizziness.    She is on cholesterol medication has been taking off rosuvastatin and zetia and was started on Repatha and denies myalgias. Her cholesterol is at goal. The cholesterol last visit was:   Lab Results  Component Value Date   CHOL 169 03/14/2022   HDL 40 03/14/2022   LDLCALC 112 (H) 03/14/2022   TRIG 93 03/14/2022   CHOLHDL 4.2 03/14/2022   She has been working on diet and exercise for glucose management, she is not on bASA (on plavix), she is on ACE/ARB and denies increased appetite, nausea, paresthesia of the feet, polydipsia, polyuria, visual disturbances and vomiting. Last A1C in the office was:  Lab Results  Component Value Date   HGBA1C 5.4 01/09/2022   She is trying to drink water. She has CKD IV and is following with Dr. Corliss Parish:   Lab Results  Component Value Date   EGFR 17 (L) 01/09/2022     Also anemia felt r/t CKD  Lab Results  Component Value Date   WBC 6.4 01/09/2022   HGB 12.4 01/09/2022   HCT 37.0 01/09/2022   MCV 96.6 01/09/2022   PLT 138 (L) 01/09/2022   She is on thyroid medication. Her medication was changed last visit to levothyroxine 50 mcg daily Lab Results  Component Value Date   TSH 0.09 (L) 01/09/2022   Patient is not currently on Vit D supplement due to elevation of Vit D level Lab Results  Component Value Date   VD25OH 131 (H) 06/27/2021       Current Medications:  Current Outpatient Medications on File Prior to Visit  Medication Sig Dispense Refill   Acetaminophen-DM (CORICIDIN HBP COLD/COUGH/FLU PO) Take by mouth.     clopidogrel (PLAVIX) 75 MG tablet Take  1 tablet   Daily  to Prevent Blood Clots                                   /                            TAKE                          BY                     MOUTH 90 tablet 3    Evolocumab (REPATHA SURECLICK) [id number] MG/ML SOAJ Inject 140 mg into the skin every 14 (fourteen) days. 2 mL 11   furosemide (LASIX) 20 MG tablet TAKE 1 TABLET(20 MG) BY MOUTH DAILY AS NEEDED 90 tablet 1   levETIRAcetam (KEPPRA) 500 MG tablet TAKE 1 TABLET  BY MOUTH TWICE DAILY TO PREVENT SEIZURES 180 tablet 3   Levocetirizine Dihydrochloride (XYZAL PO) Take by mouth.     levothyroxine (SYNTHROID) 50 MCG tablet TAKE 1 TABLET(50 MCG) BY MOUTH DAILY BEFORE BREAKFAST 90 tablet 2   mavacamten 2.5 MG CAPS Take 2.5 mg by mouth daily. 35 capsule 2   potassium chloride (KLOR-CON M) 10 MEQ tablet Take 1 tablet (10 mEq total) by mouth daily. 90 tablet 3   metoprolol succinate (TOPROL-XL) 25 MG 24 hr tablet TAKE 1 TABLET(25 MG) BY MOUTH DAILY (Patient not taking: Reported on 04/18/2022) 90 tablet 3   No current facility-administered medications on file prior to visit.   Allergies:  Allergies  Allergen Reactions   Ace Inhibitors Cough   Penicillins Other (See Comments)    "happened years ago" (reaction not recalled)   Prednisone Other (See Comments)    Dysphoria   Medical History:  She has Hypothyroidism; Hyperlipidemia; SMOKER; Essential hypertension; CAD (coronary artery disease); History of CVA (cerebrovascular accident); Peripheral vascular disease ([place]); CKD (chronic kidney disease) stage 4, GFR 15-29 ml/min ([place]); Seizure disorder ([place]); Vitamin D deficiency; Poor compliance; Medication management; Overweight (BMI 25.0-29.9); Anemia in chronic kidney disease; Claudication of left lower extremity ([place]); Emphysema of lung ([place]); Aortic Atherosclerosis ([place]) by Chest CT scan in June 2020 ; Cholelithiases; Atypical nevus of right lower back; History of basal cell carcinoma (BCC); Multiple falls; Left foot drop; Acute kidney injury ([place]); Complex renal cyst; Hypotension; Hypokalemia; Hyponatremia; History of seizure; Obstructive hypertrophic cardiomyopathy ([place]); Dyslipidemia; and S/P CABG (coronary artery  bypass graft) on their problem list. Health Maintenance:   Immunization History  Administered Date(s) Administered   Influenza Inj Mdck Quad With Preservative 01/31/2017, 12/23/2017, 01/05/2020   Influenza Split 12/02/2014   Influenza,inj,quad, With Preservative 12/20/2015   PFIZER(Purple Top)SARS-COV-2 Vaccination 07/13/2019, 08/07/2019, 04/06/2020   PPD Test 10/09/2013, 12/02/2014   Td 10/24/2006, 01/31/2017   Tetanus: 2018 Flu vaccine: 02/2019  Pap: Remote - DECLINES - discussed risks, if patient changes her mind she may request at regular visit MGM: 09/27/20 negative repeat in 1 year DEXA: get age 55  Colonoscopy: DUE - declines - agrees to cologuard but poor follow through - patient states never got, reordered last year, still hasn't completed, was ordered 10/12/20 CXR: 2015 CT chest: 08/2019, emphysema, aortic atherosclerosis CT 2020 showed cholelithiasis  Last Dental Exam: Remote - needs top teeth out - she is planning to see an oral surgeon Last Eye Exam: Annually, last visit 2020, had cataracts out, doing well    Patient Care Team: [name], MD as PCP - General (Internal Medicine) Minus Breeding, MD as PCP - Cardiology (Cardiology) Corliss Parish, MD as Consulting Physician (Nephrology)  Surgical History:  She has a past surgical history that includes Coronary artery bypass graft (2002); Embolization (2005); Cerebral aneurysm repair (Right, 2012); Cerebral aneurysm repair (Right, 2005); and Cataract extraction, bilateral (Bilateral, 2020). Family History:  Herfamily history includes Brain cancer in her father; Heart attack in her maternal grandfather; Heart attack (age of onset: 61) in her brother; Heart attack (age of onset: 48) in her mother; Heart disease in her brother and mother; Heart disease (age of onset: 70) in her brother; Leukemia (age of onset: 37) in her sister; Multiple sclerosis in her sister. Social History:  She reports that she has been smoking  cigarettes. She started smoking about 49 years ago. She has a 46.00 pack-year smoking history. She has never used smokeless tobacco. She reports that she does not drink alcohol  and does not use drugs.  Review of Systems: Review of Systems  Constitutional:  Negative for malaise/fatigue and weight loss.  HENT:  Positive for congestion. Negative for hearing loss and tinnitus.        Sneezing  Eyes:  Negative for blurred vision and double vision.  Respiratory:  Negative for cough, shortness of breath and wheezing.   Cardiovascular:  Positive for claudication (aching, cramping in L leg with ambulation). Negative for chest pain, palpitations, orthopnea and leg swelling.  Gastrointestinal:  Negative for abdominal pain, blood in stool, constipation, diarrhea, heartburn, melena, nausea and vomiting.  Genitourinary: Negative.   Musculoskeletal:  Negative for joint pain and myalgias.       Muscle weakness of lower extremities  Skin:  Negative for rash.  Neurological:  Negative for dizziness, tingling (soles of feet intermittently ), sensory change, weakness and headaches.  Endo/Heme/Allergies:  Positive for environmental allergies (She reports mild symptoms - uses OTC medications rarely as needed). Negative for polydipsia.  Psychiatric/Behavioral: Negative.    All other systems reviewed and are negative.   Physical Exam: Estimated body mass index is 23.43 kg/m as calculated from the following:   Height as of this encounter: 5' 7"$  (1.702 m).   Weight as of this encounter: 149 lb 9.6 oz (67.9 kg). BP 130/88   Pulse (!) 52   Temp 97.9 F (36.6 C)   Ht 5' 7"$  (1.702 m)   Wt 149 lb 9.6 oz (67.9 kg)   SpO2 97%   BMI 23.43 kg/m  General Appearance: Well nourished, in no apparent distress.  Eyes: PERRLA, EOMs, conjunctiva no swelling or erythema.  Sinuses: No Frontal/maxillary tenderness  ENT/Mouth: Ext aud canals clear, normal light reflex with TMs without erythema, bulging. Extremely poor  dentition - broken/missing/black teeth. NO ulcers, glands normal to palpation. No erythema, swelling, or exudate on post pharynx.  Hearing normal. Nares are pale Neck: Supple, thyroid normal. No bruits  Respiratory: Respiratory effort normal, BS present but somewhat coarse throughout; no wheezing, stridor.  Cardio: Systolic ejection murmur noted Thready symmetrical peripheral pulses without edema Chest: symmetric, with normal excursions and percussion.  Abdomen: Soft, nontender, no guarding, rebound, hernias, masses, or organomegaly.  Lymphatics: Non tender without lymphadenopathy.  Musculoskeletal: Full ROM upper extremities with 5/5 strength. Lower extremities has 4/5 strength and decreased ROM due to weakness, gait very slow and walks kicking feet to the side Skin: Warm, dry, scabbed area on right forearm Neuro: Cranial nerves intact, reflexes equal bilaterally. Normal muscle tone, no cerebellar symptoms. Sensation intact.  Psych: Awake and oriented X 3, normal affect, Insight and Judgment appropriate.       Joclynn Lumb E  2:00 PM [facility]

## 2022-04-19 LAB — COMPLETE METABOLIC PANEL WITH GFR
AG Ratio: 1.5 (calc) (ref 1.0–2.5)
ALT: 7 U/L (ref 6–29)
AST: 11 U/L (ref 10–35)
Albumin: 3.8 g/dL (ref 3.6–5.1)
Alkaline phosphatase (APISO): 80 U/L (ref 37–153)
BUN/Creatinine Ratio: 15 (calc) (ref 6–22)
BUN: 34 mg/dL — ABNORMAL HIGH (ref 7–25)
CO2: 28 mmol/L (ref 20–32)
Calcium: 8.7 mg/dL (ref 8.6–10.4)
Chloride: 105 mmol/L (ref 98–110)
Creat: 2.3 mg/dL — ABNORMAL HIGH (ref 0.50–1.05)
Globulin: 2.6 g/dL (calc) (ref 1.9–3.7)
Glucose, Bld: 83 mg/dL (ref 65–99)
Potassium: 4.2 mmol/L (ref 3.5–5.3)
Sodium: 142 mmol/L (ref 135–146)
Total Bilirubin: 0.3 mg/dL (ref 0.2–1.2)
Total Protein: 6.4 g/dL (ref 6.1–8.1)
eGFR: 23 mL/min/{1.73_m2} — ABNORMAL LOW (ref >=60)

## 2022-04-19 LAB — CBC WITH DIFFERENTIAL/PLATELET
Absolute Monocytes: 572 cells/uL (ref 200–950)
Basophils Absolute: 49 cells/uL (ref 0–200)
Basophils Relative: 0.9 %
Eosinophils Absolute: 221 cells/uL (ref 15–500)
Eosinophils Relative: 4.1 %
HCT: 38.7 % (ref 35.0–45.0)
Hemoglobin: 12.9 g/dL (ref 11.7–15.5)
Lymphs Abs: 1409 cells/uL (ref 850–3900)
MCH: 32.5 pg (ref 27.0–33.0)
MCHC: 33.3 g/dL (ref 32.0–36.0)
MCV: 97.5 fL (ref 80.0–100.0)
MPV: 10.5 fL (ref 7.5–12.5)
Monocytes Relative: 10.6 %
Neutro Abs: 3148 cells/uL (ref 1500–7800)
Neutrophils Relative %: 58.3 %
Platelets: 197 10*3/uL (ref 140–400)
RBC: 3.97 10*6/uL (ref 3.80–5.10)
RDW: 12.8 % (ref 11.0–15.0)
Total Lymphocyte: 26.1 %
WBC: 5.4 10*3/uL (ref 3.8–10.8)

## 2022-04-19 LAB — TSH: TSH: 0.46 mIU/L (ref 0.40–4.50)

## 2022-04-19 LAB — VITAMIN D 25 HYDROXY (VIT D DEFICIENCY, FRACTURES): Vit D, 25-Hydroxy: 83 ng/mL (ref 30–100)

## 2022-04-27 ENCOUNTER — Other Ambulatory Visit: Payer: 59

## 2022-05-09 ENCOUNTER — Encounter: Payer: Self-pay | Admitting: Pharmacist

## 2022-05-09 DIAGNOSIS — E785 Hyperlipidemia, unspecified: Secondary | ICD-10-CM

## 2022-05-09 DIAGNOSIS — I2510 Atherosclerotic heart disease of native coronary artery without angina pectoris: Secondary | ICD-10-CM

## 2022-05-21 ENCOUNTER — Ambulatory Visit
Admission: RE | Admit: 2022-05-21 | Discharge: 2022-05-21 | Disposition: A | Discharge status: Home / Self Care | Payer: 59 | Source: Ambulatory Visit | Attending: Nurse Practitioner | Admitting: Nurse Practitioner

## 2022-05-21 DIAGNOSIS — R911 Solitary pulmonary nodule: Secondary | ICD-10-CM

## 2022-06-04 ENCOUNTER — Other Ambulatory Visit: Payer: Self-pay | Admitting: Internal Medicine

## 2022-06-08 ENCOUNTER — Ambulatory Visit (HOSPITAL_COMMUNITY): Payer: 59 | Attending: Cardiovascular Disease

## 2022-06-08 ENCOUNTER — Ambulatory Visit: Payer: 59 | Attending: Internal Medicine

## 2022-06-08 DIAGNOSIS — E785 Hyperlipidemia, unspecified: Secondary | ICD-10-CM

## 2022-06-08 DIAGNOSIS — I421 Obstructive hypertrophic cardiomyopathy: Secondary | ICD-10-CM | POA: Insufficient documentation

## 2022-06-08 DIAGNOSIS — I2510 Atherosclerotic heart disease of native coronary artery without angina pectoris: Secondary | ICD-10-CM

## 2022-06-08 LAB — ECHOCARDIOGRAM LIMITED
Area-P 1/2: 2.92 cm2
S' Lateral: 2.3 cm

## 2022-06-09 LAB — LIPID PANEL
Chol/HDL Ratio: 3.7 ratio (ref 0.0–4.4)
Cholesterol, Total: 130 mg/dL (ref 100–199)
HDL: 35 mg/dL — ABNORMAL LOW (ref >39)
LDL Chol Calc (NIH): 63 mg/dL (ref 0–99)
Triglycerides: 189 mg/dL — ABNORMAL HIGH (ref 0–149)
VLDL Cholesterol Cal: 32 mg/dL (ref 5–40)

## 2022-06-09 LAB — HEPATIC FUNCTION PANEL
ALT: 16 IU/L (ref 0–32)
AST: 19 IU/L (ref 0–40)
Albumin: 3.8 g/dL — ABNORMAL LOW (ref 3.9–4.9)
Alkaline Phosphatase: 96 IU/L (ref 44–121)
Bilirubin Total: <0.2 mg/dL (ref 0.0–1.2)
Bilirubin, Direct: <0.1 mg/dL (ref 0.00–0.40)
Total Protein: 6 g/dL (ref 6.0–8.5)

## 2022-06-11 ENCOUNTER — Telehealth: Payer: Self-pay

## 2022-06-11 ENCOUNTER — Other Ambulatory Visit: Payer: 59

## 2022-06-11 DIAGNOSIS — I421 Obstructive hypertrophic cardiomyopathy: Secondary | ICD-10-CM

## 2022-06-11 NOTE — Telephone Encounter (Signed)
The patient has been notified of the result and verbalized understanding.  All questions (if any) were answered. Arvid Right Alaiya Martindelcampo, RN 06/11/2022 11:56 AM   Pt has not started any new medications OTC or prescribed.  No HF symptoms to report.  Echo scheduled for 08/28/22 at 8:30 am.  [employer] [facility] portal updated.

## 2022-06-11 NOTE — Telephone Encounter (Signed)
-----   Message from Christell Constant, MD sent at 06/10/2022  2:55 PM EDT ----- Results: Peak LVOT Gradient: 10 mm HG (at best estimate, not well evaluated no late peaking gradient) LVEF : 60% Plan: Mavacamten dose 2.5 mg - If Patient has new medications or supplements, will need medication review for potential interactions - Echo in 12 weeks (limited)   Christell Constant, MD

## 2022-06-22 ENCOUNTER — Other Ambulatory Visit: Payer: Self-pay | Admitting: Internal Medicine

## 2022-06-22 MED ORDER — MAVACAMTEN 2.5 MG PO CAPS: 2.5000 mg | ORAL_CAPSULE | Freq: Every day | ORAL | 2 refills | Status: DC

## 2022-06-22 NOTE — Addendum Note (Signed)
Addended by: Macie Burows on: 06/22/2022 01:32 PM   Modules accepted: Orders

## 2022-06-22 NOTE — Telephone Encounter (Signed)
Sent refill for mavacamten 2.5 mg PO QD to specialty pharmacy on file.

## 2022-06-26 ENCOUNTER — Telehealth: Payer: Self-pay | Admitting: Pharmacist

## 2022-06-26 NOTE — Telephone Encounter (Signed)
[employer] prior auth submitted, key [id number].

## 2022-06-26 NOTE — Telephone Encounter (Signed)
PA approved through 06/26/23. Info faxed to pt's pharmacy.

## 2022-06-28 ENCOUNTER — Encounter: Payer: 59 | Admitting: Nurse Practitioner

## 2022-07-14 ENCOUNTER — Other Ambulatory Visit: Payer: Self-pay | Admitting: Nurse Practitioner

## 2022-07-14 DIAGNOSIS — J309 Allergic rhinitis, unspecified: Secondary | ICD-10-CM

## 2022-07-16 NOTE — Progress Notes (Unsigned)
 Complete Physical  Assessment and Plan:  Diagnoses and all orders for this visit:  Encounter for routine physical examination with abnormal findings Declines PAP, she agrees to CT lung cancer screening, cologuard, she will schedule mammogram  Atherosclerosis of aorta Per CT 07/2018 Control blood pressure, cholesterol, glucose, increase exercise.   Essential hypertension Continue medication Monitor blood pressure at home; call if consistently over 130/80 Continue to recommend DASH diet.   Reminder to go to the ER if any CP, SOB, nausea, dizziness, severe HA, changes vision/speech, left arm numbness and tingling and jaw pain.  Atherosclerosis of native coronary artery of native heart without angina pectoris Continue to follow with Cardiology, last saw Dr. Antoine Poche in 02/2021, has appt scheduled 07/26/21 Quit smoking - patient has cut down to 4-5 a day Continue plavix   Hx of CVA Recommended she quit smoking, continue plavix, statin.   Hypothyroidism, unspecified type continue medications the same reminded to take on an empty stomach 30-29mins before food.  -     TSH  Seizure disorder (HCC) Continue keppra; recommended she quit smoking but unwilling at this time. Monitor levels.  Refer back to [facility] neuro if any changes; last in 2012 per patient  - Keppra level  Stage 4 CKD  Followed by nephrology - [facility] -     CMP/GFR -     Urinalysis, Complete (81001) -     Microalbumin / creatinine urine ratio  Anemia due to CKD [facility]) Nephrology is managing -  Check CBC  Hyperlipidemia Continue medications LDL goal <70 Continue to recommend low cholesterol diet and exercise.  -     Lipid panel  SMOKER Continues to smoke 4-5 cigarettes a day- 45+ pack year history instruction/counseling given, counseled patient on the dangers of tobacco use, advised patient to stop smoking, and reviewed strategies to maximize success, Chantix gave pt nightmares. - Discussed low  dose screening CT and will proceed with patient's permission, due 09/2021  Emphysema [facility]) Per imaging, recommended stop smoking Denies sx; monitor   Vitamin D deficiency Will check value and plan supplementation based on result -     VITAMIN D 25 Hydroxy (Vit-D Deficiency, Fractures)  Abnormal Glucose Discussed disease and risks Discussed diet/exercise, weight management  -     Hemoglobin A1c  Medication management -     CBC with Differential/Platelet -     CMP/GFR -     Magnesium   BMI 24 Long discussion about weight loss, diet, and exercise Recommended diet heavy in fruits and veggies and low in animal meats, cheeses, and dairy products, appropriate calorie intake Discussed appropriate weight for height  Follow up at next visit  Screening for hematuria/proteinuria - Routine UA with reflex microscopic - Microablumin/creatinine urine ratio  Screening for ischemic heart disease - EKG  Screening for colorectal cancer Colonoscopy- patient declines a colonoscopy even though the risks and benefits were discussed at length. Colon cancer is 3rd most diagnosed cancer and 2nd leading cause of death in both men and women 64 years of age and older. Patient understands the risk of cancer and death with declining the test however they are willing to do cologuard screening instead. They understand that this is not as sensitive or specific as a colonoscopy and they are still recommended to get a colonoscopy.Never completed cologuard- reordered today  Peripheral vascular disease (HCC) Continue aggressive treatment of cholesterol Continue plavix ABI reordered Continue cardiology follow up -   PVD/Diminished pulses/LLE claudication - ABI ordered, has not had  Control blood pressure, cholesterol, glucose, increase exercise, on plavix   Left foot drop - Completed PT last year - Has been practicing the techniques/exercises, wears foot brace to keep foot up  Poor compliance Reviewed  overdue tests with patient; she agrees to complete cologuard, schedule mammogram Will continue close follow up q90m visits, call to verify progress in a few weeks   Discussed med's effects and SE's. Screening labs and tests as requested with regular follow-up as recommended. Over 40 minutes of exam, counseling, chart review, and complex, high level critical decision making was performed this visit.   Future Appointments  Date Time Provider Department Center  07/17/2022  2:00 PM Raynelle Dick, NP [facility] None  08/28/2022  8:30 AM [facility] ECHO 4 [facility]  09/18/2022  9:40 AM Christell Constant, MD [facility]  10/04/2022 12:30 PM [facility] HS VASC 6 - MK [facility]  10/04/2022  1:20 PM Chuck Hint, MD [facility]  07/17/2023  2:00 PM Raynelle Dick, NP [facility] None     HPI  62 y.o. Caucasian married female,  - presents for a complete physical. She has Hypothyroidism; Hyperlipidemia; SMOKER; Essential hypertension; CAD (coronary artery disease); History of CVA (cerebrovascular accident); Peripheral vascular disease (HCC); CKD (chronic kidney disease) stage 4, GFR 15-29 ml/min (HCC); Seizure disorder (HCC); Vitamin D deficiency; Poor compliance; Medication management; Overweight (BMI 25.0-29.9); Anemia in chronic kidney disease; Claudication of left lower extremity (HCC); Emphysema of lung (HCC); Aortic Atherosclerosis (HCC) by Chest CT scan in June 2020 ; Cholelithiases; Atypical nevus of right lower back; History of basal cell carcinoma (BCC); Multiple falls; Left foot drop; Acute kidney injury (HCC); Complex renal cyst; Hypotension; Hypokalemia; Hyponatremia; History of seizure; Obstructive hypertrophic cardiomyopathy (HCC); Dyslipidemia; and S/P CABG (coronary artery bypass graft) on their problem list.   Married, works from home for [employer]. No children. Has a dog (5 lb mico yorkie).   No concerns today.   Hx of ruptured cerebral  aneurysm in 2005 with repeated repair in 2012, hx of seizure disorder on keppra - no recent seizures - last in 2008-  Formerly followed by [facility], now following keppra levels here in office.   She is followed by Nephrology Dr. Annie Sable due to progression to CKD IV.  she currently continues to smoke 0.5 pack a day; 45+ pack year history; discussed risks associated with smoking at length, patient is not ready to quit. She states she is very aware of risks and has quit in the past. She is aware that she needs to make this decision for her health. She has failed numerous attempts with Chantix. Down from 1.5-2 packs to 0.5 pack daily. Last CT 10/25/20 showed no concerning nodules, did show emphysemia and aortic atherosclerosis. Recommended continued annual screening. Repeat due 09/2021  BMI is There is no height or weight on file to calculate BMI., she has been working on diet and exercise. She has a pedal exerciser at her desk and uses daily - makes small healthy choices.  Wt Readings from Last 3 Encounters:  04/18/22 149 lb 9.6 oz (67.9 kg)  03/27/22 150 lb (68 kg)  01/15/22 150 lb (68 kg)   She has ASCVD with bypass in 2002, followed annually by cardiology Dr. Elease Hashimoto last 2019, overdue.   She has hx of PVD, on statin and plavix, has been reporting aching in LLE after 5 blocks, ABI has been ordered in the past but was not completed. She has atherosclerosis of  the aorta per CT 07/2018.  Her blood pressure has been controlled at home, today their BP is    BP Readings from Last 3 Encounters:  04/18/22 130/88  03/27/22 138/78  01/15/22 130/60  She does workout- cycler at her desk.   She denies chest pain, shortness of breath, dizziness.    She is on cholesterol medication (rosuvastatin 40 mg daily, zetia 10 mg daily) and denies myalgias. Her cholesterol is at goal. The cholesterol last visit was:   Lab Results  Component Value Date   CHOL 130 06/08/2022   HDL 35 (L)  06/08/2022   LDLCALC 63 06/08/2022   TRIG 189 (H) 06/08/2022   CHOLHDL 3.7 06/08/2022   She has been working on diet and exercise for glucose management, she is not on bASA (on plavix), she is on ACE/ARB and denies increased appetite, nausea, paresthesia of the feet, polydipsia, polyuria, visual disturbances and vomiting. Last A1C in the office was:  Lab Results  Component Value Date   HGBA1C 5.4 01/09/2022   She has CKD IV and is following with Dr. Annie Sable:   Lab Results  Component Value Date   EGFR 23 (L) 04/18/2022    Lab Results  Component Value Date   GFRNONAA 32 (L) 02/01/2021   Also anemia felt r/t CKD  Lab Results  Component Value Date   WBC 5.4 04/18/2022   HGB 12.9 04/18/2022   HCT 38.7 04/18/2022   MCV 97.5 04/18/2022   PLT 197 04/18/2022   She is on thyroid medication. Her medication was not changed last visit. Taking 88 mcg daily.  Lab Results  Component Value Date   TSH 0.46 04/18/2022   Patient is not currently on Vit D supplement Lab Results  Component Value Date   VD25OH 83 04/18/2022       Current Medications:  Current Outpatient Medications on File Prior to Visit  Medication Sig Dispense Refill   Acetaminophen-DM (CORICIDIN HBP COLD/COUGH/FLU PO) Take by mouth.     clopidogrel (PLAVIX) 75 MG tablet TAKE 1 TABLET  DAILY TO  PREVENT BLOOD CLOTS                                                     /                                               TAKE                                 BY                                          MOUTH 90 tablet 3   Evolocumab (REPATHA SURECLICK) 140 MG/ML SOAJ Inject 140 mg into the skin every 14 (fourteen) days. 2 mL 11   fluticasone (FLONASE) 50 MCG/ACT nasal spray Place 2 sprays into both nostrils daily. 15.8 mL 2   furosemide (LASIX) 20 MG tablet TAKE 1 TABLET(20 MG) BY MOUTH DAILY AS NEEDED 90 tablet 1   levETIRAcetam (KEPPRA) 500  MG tablet TAKE 1 TABLET BY MOUTH TWICE DAILY TO PREVENT SEIZURES 180 tablet 3    Levocetirizine Dihydrochloride (XYZAL PO) Take by mouth.     levothyroxine (SYNTHROID) 50 MCG tablet TAKE 1 TABLET(50 MCG) BY MOUTH DAILY BEFORE BREAKFAST 90 tablet 2   mavacamten 2.5 MG CAPS Take 2.5 mg by mouth daily. 35 capsule 2   metoprolol succinate (TOPROL-XL) 25 MG 24 hr tablet TAKE 1 TABLET(25 MG) BY MOUTH DAILY 90 tablet 3   montelukast (SINGULAIR) 10 MG tablet TAKE 1 TABLET(10 MG) BY MOUTH DAILY 30 tablet 2   potassium chloride (KLOR-CON M) 10 MEQ tablet Take 1 tablet (10 mEq total) by mouth daily. 90 tablet 3   No current facility-administered medications on file prior to visit.   Allergies:  Allergies  Allergen Reactions   Ace Inhibitors Cough   Penicillins Other (See Comments)    "happened years ago" (reaction not recalled)   Prednisone Other (See Comments)    Dysphoria   Medical History:  She has Hypothyroidism; Hyperlipidemia; SMOKER; Essential hypertension; CAD (coronary artery disease); History of CVA (cerebrovascular accident); Peripheral vascular disease (HCC); CKD (chronic kidney disease) stage 4, GFR 15-29 ml/min (HCC); Seizure disorder (HCC); Vitamin D deficiency; Poor compliance; Medication management; Overweight (BMI 25.0-29.9); Anemia in chronic kidney disease; Claudication of left lower extremity (HCC); Emphysema of lung (HCC); Aortic Atherosclerosis (HCC) by Chest CT scan in June 2020 ; Cholelithiases; Atypical nevus of right lower back; History of basal cell carcinoma (BCC); Multiple falls; Left foot drop; Acute kidney injury (HCC); Complex renal cyst; Hypotension; Hypokalemia; Hyponatremia; History of seizure; Obstructive hypertrophic cardiomyopathy (HCC); Dyslipidemia; and S/P CABG (coronary artery bypass graft) on their problem list. Health Maintenance:   Immunization History  Administered Date(s) Administered   Influenza Inj Mdck Quad With Preservative 01/31/2017, 12/23/2017, 01/05/2020   Influenza Split 12/02/2014   Influenza,inj,quad, With Preservative  12/20/2015   PFIZER(Purple Top)SARS-COV-2 Vaccination 07/13/2019, 08/07/2019, 04/06/2020   PPD Test 10/09/2013, 12/02/2014   Td 10/24/2006, 01/31/2017   Tetanus: 2018 Flu vaccine: 02/2019  Pap: Remote - DECLINES - discussed risks, if patient changes her mind she may request at regular visit MGM: 09/27/20 negative repeat in 1 year DEXA: get age 24  Colonoscopy: DUE - declines - agrees to cologuard but poor follow through - patient states never got, reordered last year, still hasn't completed, was ordered 10/12/20 CXR: 2015 CT chest: 08/2019, emphysema, aortic atherosclerosis CT 2020 showed cholelithiasis  Last Dental Exam: Remote - needs top teeth out - she is planning to see an oral surgeon Last Eye Exam: Annually, last visit 2020, had cataracts out, doing well    Patient Care Team: Lucky Cowboy, MD as PCP - General (Internal Medicine) Rollene Rotunda, MD as PCP - Cardiology (Cardiology) Annie Sable, MD as Consulting Physician (Nephrology)  Surgical History:  She has a past surgical history that includes Coronary artery bypass graft (2002); Embolization (2005); Cerebral aneurysm repair (Right, 2012); Cerebral aneurysm repair (Right, 2005); and Cataract extraction, bilateral (Bilateral, 2020). Family History:  Herfamily history includes Brain cancer in her father; Heart attack in her maternal grandfather; Heart attack (age of onset: 65) in her brother; Heart attack (age of onset: 34) in her mother; Heart disease in her brother and mother; Heart disease (age of onset: 62) in her brother; Leukemia (age of onset: 68) in her sister; Multiple sclerosis in her sister. Social History:  She reports that she has been smoking cigarettes. She started smoking about 49 years ago. She has a 46.00 pack-year  smoking history. She has never used smokeless tobacco. She reports that she does not drink alcohol and does not use drugs.  Review of Systems: Review of Systems  Constitutional:  Negative  for malaise/fatigue and weight loss.  HENT:  Negative for hearing loss and tinnitus.   Eyes:  Negative for blurred vision and double vision.  Respiratory:  Negative for cough, shortness of breath and wheezing.   Cardiovascular:  Positive for claudication (aching, cramping in L leg with ambulation). Negative for chest pain, palpitations, orthopnea and leg swelling.  Gastrointestinal:  Negative for abdominal pain, blood in stool, constipation, diarrhea, heartburn, melena, nausea and vomiting.  Genitourinary: Negative.   Musculoskeletal:  Negative for joint pain and myalgias.  Skin:  Negative for rash.  Neurological:  Negative for dizziness, tingling (soles of feet intermittently ), sensory change, weakness and headaches.  Endo/Heme/Allergies:  Positive for environmental allergies (She reports mild symptoms - uses OTC medications rarely as needed). Negative for polydipsia.  Psychiatric/Behavioral: Negative.    All other systems reviewed and are negative.   Physical Exam: Estimated body mass index is 23.43 kg/m as calculated from the following:   Height as of 04/18/22: 5\' 7"  (1.702 m).   Weight as of 04/18/22: 149 lb 9.6 oz (67.9 kg). There were no vitals taken for this visit. General Appearance: Well nourished, in no apparent distress.  Eyes: PERRLA, EOMs, conjunctiva no swelling or erythema.  Sinuses: No Frontal/maxillary tenderness  ENT/Mouth: Ext aud canals clear, normal light reflex with TMs without erythema, bulging. Extremely poor dentition - broken/missing/black teeth. NO ulcers, glands normal to palpation. No erythema, swelling, or exudate on post pharynx. Tonsils not swollen or erythematous. Hearing normal.  Neck: Supple, thyroid normal. No bruits  Respiratory: Respiratory effort normal, BS present but somewhat coarse throughout; no wheezing, stridor.  Cardio: RRR without murmurs, rubs or gallops. Thready symmetrical peripheral pulses with mild non-pitting edema  Chest: symmetric,  with normal excursions and percussion.  Breasts: Declines today  Abdomen: Soft, nontender, no guarding, rebound, hernias, masses, or organomegaly.  Lymphatics: Non tender without lymphadenopathy.  Genitourinary: Patient declines Musculoskeletal: Full ROM all peripheral extremities,5/5 strength, and normal gait.  Skin: Warm, dry, scabbed area on right forearm Neuro: Cranial nerves intact, reflexes equal bilaterally. Normal muscle tone, no cerebellar symptoms. Sensation intact.  Psych: Awake and oriented X 3, normal affect, Insight and Judgment appropriate.    EKG: Sinus brady, NSCPT   Aland Chestnutt E  11:00 AM [facility]

## 2022-07-17 ENCOUNTER — Ambulatory Visit (INDEPENDENT_AMBULATORY_CARE_PROVIDER_SITE_OTHER): Payer: 59 | Admitting: Nurse Practitioner

## 2022-07-17 ENCOUNTER — Encounter: Payer: Self-pay | Admitting: Nurse Practitioner

## 2022-07-17 VITALS — BP 122/82 | HR 47 | Temp 97.5°F | Ht 66.5 in | Wt 151.6 lb

## 2022-07-17 DIAGNOSIS — Z0000 Encounter for general adult medical examination without abnormal findings: Secondary | ICD-10-CM

## 2022-07-17 DIAGNOSIS — E782 Mixed hyperlipidemia: Secondary | ICD-10-CM

## 2022-07-17 DIAGNOSIS — E559 Vitamin D deficiency, unspecified: Secondary | ICD-10-CM

## 2022-07-17 DIAGNOSIS — G40909 Epilepsy, unspecified, not intractable, without status epilepticus: Secondary | ICD-10-CM

## 2022-07-17 DIAGNOSIS — Z0001 Encounter for general adult medical examination with abnormal findings: Secondary | ICD-10-CM

## 2022-07-17 DIAGNOSIS — F17200 Nicotine dependence, unspecified, uncomplicated: Secondary | ICD-10-CM

## 2022-07-17 DIAGNOSIS — Z1389 Encounter for screening for other disorder: Secondary | ICD-10-CM

## 2022-07-17 DIAGNOSIS — R7309 Other abnormal glucose: Secondary | ICD-10-CM

## 2022-07-17 DIAGNOSIS — N184 Chronic kidney disease, stage 4 (severe): Secondary | ICD-10-CM

## 2022-07-17 DIAGNOSIS — Z6824 Body mass index (BMI) 24.0-24.9, adult: Secondary | ICD-10-CM

## 2022-07-17 DIAGNOSIS — I739 Peripheral vascular disease, unspecified: Secondary | ICD-10-CM

## 2022-07-17 DIAGNOSIS — Z79899 Other long term (current) drug therapy: Secondary | ICD-10-CM

## 2022-07-17 DIAGNOSIS — I700 Atherosclerosis of aorta: Secondary | ICD-10-CM

## 2022-07-17 DIAGNOSIS — E039 Hypothyroidism, unspecified: Secondary | ICD-10-CM

## 2022-07-17 DIAGNOSIS — J432 Centrilobular emphysema: Secondary | ICD-10-CM

## 2022-07-17 DIAGNOSIS — I421 Obstructive hypertrophic cardiomyopathy: Secondary | ICD-10-CM

## 2022-07-17 DIAGNOSIS — I10 Essential (primary) hypertension: Secondary | ICD-10-CM

## 2022-07-17 DIAGNOSIS — Z136 Encounter for screening for cardiovascular disorders: Secondary | ICD-10-CM

## 2022-07-17 DIAGNOSIS — D631 Anemia in chronic kidney disease: Secondary | ICD-10-CM

## 2022-07-17 DIAGNOSIS — I2510 Atherosclerotic heart disease of native coronary artery without angina pectoris: Secondary | ICD-10-CM

## 2022-07-17 DIAGNOSIS — Z8673 Personal history of transient ischemic attack (TIA), and cerebral infarction without residual deficits: Secondary | ICD-10-CM

## 2022-07-17 DIAGNOSIS — R569 Unspecified convulsions: Secondary | ICD-10-CM

## 2022-07-17 LAB — CBC WITH DIFFERENTIAL/PLATELET
Basophils Absolute: 73 cells/uL (ref 0–200)
HCT: 41 % (ref 35.0–45.0)
MCV: 98.3 fL (ref 80.0–100.0)
MPV: 10.5 fL (ref 7.5–12.5)
Neutrophils Relative %: 58.7 %
Platelets: 198 10*3/uL (ref 140–400)
Total Lymphocyte: 29 %

## 2022-07-17 MED ORDER — LEVETIRACETAM 500 MG PO TABS
ORAL_TABLET | ORAL | 3 refills | Status: DC
Start: 2022-07-17 — End: 2023-09-09

## 2022-07-17 NOTE — Patient Instructions (Signed)

## 2022-07-18 LAB — COMPLETE METABOLIC PANEL WITH GFR
ALT: 6 U/L (ref 6–29)
Calcium: 8.6 mg/dL (ref 8.6–10.4)
Total Bilirubin: 0.2 mg/dL (ref 0.2–1.2)
Total Protein: 6.2 g/dL (ref 6.1–8.1)
eGFR: 27 mL/min/{1.73_m2} — ABNORMAL LOW (ref >=60)

## 2022-07-18 LAB — URINALYSIS, ROUTINE W REFLEX MICROSCOPIC
Bilirubin Urine: NEGATIVE
Hyaline Cast: NONE SEEN /LPF

## 2022-07-18 LAB — CBC WITH DIFFERENTIAL/PLATELET
Absolute Monocytes: 528 cells/uL (ref 200–950)
MCH: 32.1 pg (ref 27.0–33.0)
Monocytes Relative: 8 %
Neutro Abs: 3874 cells/uL (ref 1500–7800)
RBC: 4.17 10*6/uL (ref 3.80–5.10)
WBC: 6.6 10*3/uL (ref 3.8–10.8)

## 2022-07-18 LAB — MICROALBUMIN / CREATININE URINE RATIO: Creatinine, Urine: 27 mg/dL (ref 20–275)

## 2022-07-21 LAB — CBC WITH DIFFERENTIAL/PLATELET
Basophils Relative: 1.1 %
Eosinophils Absolute: 211 cells/uL (ref 15–500)
Eosinophils Relative: 3.2 %
Hemoglobin: 13.4 g/dL (ref 11.7–15.5)
Lymphs Abs: 1914 cells/uL (ref 850–3900)
MCHC: 32.7 g/dL (ref 32.0–36.0)
RDW: 12.9 % (ref 11.0–15.0)

## 2022-07-21 LAB — TSH: TSH: 1.46 mIU/L (ref 0.40–4.50)

## 2022-07-21 LAB — COMPLETE METABOLIC PANEL WITH GFR
AG Ratio: 1.6 (calc) (ref 1.0–2.5)
AST: 15 U/L (ref 10–35)
Albumin: 3.8 g/dL (ref 3.6–5.1)
Alkaline phosphatase (APISO): 79 U/L (ref 37–153)
BUN/Creatinine Ratio: 15 (calc) (ref 6–22)
BUN: 30 mg/dL — ABNORMAL HIGH (ref 7–25)
CO2: 27 mmol/L (ref 20–32)
Chloride: 106 mmol/L (ref 98–110)
Creat: 2.05 mg/dL — ABNORMAL HIGH (ref 0.50–1.05)
Globulin: 2.4 g/dL (calc) (ref 1.9–3.7)
Glucose, Bld: 77 mg/dL (ref 65–99)
Potassium: 4.8 mmol/L (ref 3.5–5.3)
Sodium: 144 mmol/L (ref 135–146)

## 2022-07-21 LAB — VITAMIN D 25 HYDROXY (VIT D DEFICIENCY, FRACTURES): Vit D, 25-Hydroxy: 65 ng/mL (ref 30–100)

## 2022-07-21 LAB — URINALYSIS, ROUTINE W REFLEX MICROSCOPIC
Bacteria, UA: NONE SEEN /HPF
Glucose, UA: NEGATIVE
Hgb urine dipstick: NEGATIVE
Ketones, ur: NEGATIVE
Leukocytes,Ua: NEGATIVE
Nitrite: NEGATIVE
RBC / HPF: NONE SEEN /HPF (ref 0–2)
Specific Gravity, Urine: 1.009 (ref 1.001–1.035)
WBC, UA: NONE SEEN /HPF (ref 0–5)
pH: 5.5 (ref 5.0–8.0)

## 2022-07-21 LAB — MAGNESIUM: Magnesium: 2.1 mg/dL (ref 1.5–2.5)

## 2022-07-21 LAB — MICROALBUMIN / CREATININE URINE RATIO
Microalb Creat Ratio: 444 mg/g creat — ABNORMAL HIGH (ref <30)
Microalb, Ur: 12 mg/dL

## 2022-07-21 LAB — LEVETIRACETAM, IMMUNOASSAY: LEVETIRACETAM, IMMUNOASSAY: 39.1 ug/mL (ref 6.0–46.0)

## 2022-07-21 LAB — MICROSCOPIC MESSAGE

## 2022-07-21 LAB — HEMOGLOBIN A1C
Hgb A1c MFr Bld: 5.3 % of total Hgb (ref <5.7)
Mean Plasma Glucose: 105 mg/dL
eAG (mmol/L): 5.8 mmol/L

## 2022-08-11 ENCOUNTER — Other Ambulatory Visit: Payer: Self-pay | Admitting: Internal Medicine

## 2022-08-17 ENCOUNTER — Other Ambulatory Visit: Payer: Self-pay | Admitting: Nurse Practitioner

## 2022-08-17 DIAGNOSIS — E785 Hyperlipidemia, unspecified: Secondary | ICD-10-CM

## 2022-08-28 ENCOUNTER — Telehealth: Payer: Self-pay

## 2022-08-28 ENCOUNTER — Ambulatory Visit (HOSPITAL_COMMUNITY): Payer: 59

## 2022-08-28 NOTE — Telephone Encounter (Signed)
Called pt to f/u 08/28/22 Echo rescheduled to 09/17/22.  Advised pt in order to stay in compliance with mavacamten pt must get Echocardiograms as ordered.  Pt reports spouse had an important Oncology appointment today and she had to be with him.   Advised will have scheduler reach out to reschedule Echo.  Advised pt any time needs to reschedule echo to ask for nurse.

## 2022-08-31 ENCOUNTER — Ambulatory Visit (HOSPITAL_COMMUNITY): Payer: 59 | Attending: Cardiology

## 2022-08-31 DIAGNOSIS — I421 Obstructive hypertrophic cardiomyopathy: Secondary | ICD-10-CM | POA: Diagnosis not present

## 2022-08-31 LAB — ECHOCARDIOGRAM LIMITED: Area-P 1/2: 2.35 cm2

## 2022-09-02 ENCOUNTER — Telehealth: Payer: Self-pay | Admitting: Internal Medicine

## 2022-09-02 DIAGNOSIS — I421 Obstructive hypertrophic cardiomyopathy: Secondary | ICD-10-CM

## 2022-09-02 NOTE — Telephone Encounter (Signed)
Called Patient - LVOT gradient 6 mm Hg with Valsalva, LVEF 60% - no new medications - starting to go back to the gym -still comtemplative about smoking cessation  Resubmit for Mavacamten 2.5 mg PO as a maintenance dose Her REMS is overdue: next limited echo should be around 11/07/2022  Riley Lam, MD Cardiologist [facility]  [address] [place], [address] [place], [place] [address] [phone]  11:34 AM

## 2022-09-03 MED ORDER — MAVACAMTEN 2.5 MG PO CAPS: 2.5000 mg | ORAL_CAPSULE | Freq: Every day | ORAL | 2 refills | Status: DC

## 2022-09-03 NOTE — Telephone Encounter (Signed)
PSF updated on [facility] REMS Portal.  Refill of Camzyos sent to [facility] specialty pharmacy.  Will wait until REMS dashboard updates to schedule next limited echo.

## 2022-09-03 NOTE — Addendum Note (Signed)
Addended by: Macie Burows on: 09/03/2022 11:52 AM   Modules accepted: Orders

## 2022-09-06 NOTE — Telephone Encounter (Signed)
Placed order for limited echo due 9/11-9/19 per [employer].

## 2022-09-06 NOTE — Addendum Note (Signed)
Addended by: Macie Burows on: 09/06/2022 01:06 PM   Modules accepted: Orders

## 2022-09-17 ENCOUNTER — Other Ambulatory Visit (HOSPITAL_COMMUNITY): Payer: 59

## 2022-09-17 NOTE — Progress Notes (Unsigned)
Cardiology Office Note:    Date:  09/18/2022   ID:  Olivia Walsh, DOB [birth year], MRN [id number]  PCP:  Lucky Cowboy, MD   [facility] Providers Cardiologist:  Rollene Rotunda, MD     Referring MD: Lucky Cowboy, MD   CC: [facility]  History of Present Illness:    Olivia Walsh is a 61 y.o. female with a hx of oHCM (Peak gradient 102 in 2022, moderate MR, septal thickness 16 mm, no LGE). 2023: She had light-headedness, fatigue and did not feel well.  Deferred CMIs but wanted to try stress testing (has drop foot) for symptoms burden.  Was unable to do testing. We started cardiac myosin inhibitors (see prior note)  Incidentally had elevated LFTs.  She cut back on her goody powder (we recommended cessation).  We temporarily stopped her statin and zetia. 2024: on 2.5 mg Mavacamten felt better.  Did not do her exercise prescription attempt. Husband has an ablation and afterwards she is going to the gym.   Husband has stage 4 Cancer.  He did poorly on current therapy   Patient notes  SOB at rest and no DOE Notes no fatigue. Notes no palpitations Notes no CP. Notes no Dizziness. Notes no syncope.   Past Medical History:  Diagnosis Date   Aneurysm (HCC)    CAD (coronary artery disease) 2002   hx CABG 2002, Cath 2011 w/ occluded native vessels but LIMA-LAD, SVG-pLAD, SVG-Diag, SVG-OM patent, SVG-RCA occluded   Cholelithiasis    CKD (chronic kidney disease) stage 3, GFR 30-59 ml/min (HCC)    History of basal cell carcinoma excision 12/20/2017   Hyperlipidemia LDL goal <70    Hypertension    Hypothyroidism    PAD (peripheral artery disease) (HCC)    Renal cyst    Tobacco abuse    Vitamin D deficiency     Past Surgical History:  Procedure Laterality Date   CATARACT EXTRACTION, BILATERAL Bilateral 2020   CEREBRAL ANEURYSM REPAIR Right 2012   CEREBRAL ANEURYSM REPAIR Right 2005   CORONARY ARTERY BYPASS GRAFT  2002   Dr Laneta Simmers   EMBOLIZATION  2005    Embolization of right PICA aneurysm    Current Medications: Current Meds  Medication Sig   clopidogrel (PLAVIX) 75 MG tablet TAKE 1 TABLET  DAILY TO  PREVENT BLOOD CLOTS                                                     /                                               TAKE                                 BY                                          MOUTH   Evolocumab (REPATHA SURECLICK) 140 MG/ML SOAJ Inject 140 mg into the skin every 14 (fourteen) days.   fluticasone (FLONASE) 50 MCG/ACT  nasal spray Place 2 sprays into both nostrils daily.   furosemide (LASIX) 20 MG tablet TAKE 1 TABLET(20 MG) BY MOUTH DAILY AS NEEDED   levETIRAcetam (KEPPRA) 500 MG tablet TAKE 1 TABLET BY MOUTH TWICE DAILY TO PREVENT SEIZURES   Levocetirizine Dihydrochloride (XYZAL PO) Take by mouth.   levothyroxine (SYNTHROID) 50 MCG tablet TAKE 1 TABLET(50 MCG) BY MOUTH DAILY BEFORE BREAKFAST   mavacamten (CAMZYOS) 2.5 MG CAPS capsule Take 1 capsule (2.5 mg total) by mouth daily.   metoprolol succinate (TOPROL-XL) 25 MG 24 hr tablet TAKE 1 TABLET(25 MG) BY MOUTH DAILY   montelukast (SINGULAIR) 10 MG tablet TAKE 1 TABLET(10 MG) BY MOUTH DAILY   potassium chloride (KLOR-CON M) 10 MEQ tablet Take 1 tablet (10 mEq total) by mouth daily.   rosuvastatin (CRESTOR) 40 MG tablet TAKE 1 TABLET BY MOUTH DAILY FOR CHOLESTEROL     Allergies:   Ace inhibitors, Penicillins, and Prednisone   Social History   Socioeconomic History   Marital status: Married    Spouse name: Not on file   Number of children: 0   Years of education: Not on file   Highest education level: Not on file  Occupational History   Not on file  Tobacco Use   Smoking status: Every Day    Current packs/day: 1.00    Average packs/day: 1 pack/day for 49.6 years (49.6 ttl pk-yrs)    Types: Cigarettes    Start date: 60   Smokeless tobacco: Never   Tobacco comments:    1/2-2 ppd   Vaping Use   Vaping status: Never Used  Substance and Sexual Activity    Alcohol use: No    Comment: rarely, 1-2 per year   Drug use: No   Sexual activity: Yes    Partners: Male    Birth control/protection: Post-menopausal  Other Topics Concern   Not on file  Social History Narrative   Not on file   Social Determinants of Health   Financial Resource Strain: Not on file  Food Insecurity: Not on file  Transportation Needs: Not on file  Physical Activity: Inactive (04/02/2018)   Exercise Vital Sign    Days of Exercise per Week: 0 days    Minutes of Exercise per Session: 0 min  Stress: Stress Concern Present (04/02/2018)   [employer] of Occupational Health - Occupational Stress Questionnaire    Feeling of Stress : To some extent  Social Connections: Not on file    Family History: The patient's family history includes Brain cancer in her father; Heart attack in her maternal grandfather; Heart attack (age of onset: 14) in her brother; Heart attack (age of onset: 11) in her mother; Heart disease in her brother and mother; Heart disease (age of onset: 69) in her brother; Leukemia (age of onset: 54) in her sister; Multiple sclerosis in her sister.  ROS:   Please see the history of present illness.     EKGs/Labs/Other Studies Reviewed:    Recent Labs: 07/17/2022: ALT 6; BUN 30; Creat 2.05; Hemoglobin 13.4; Magnesium 2.1; Platelets 198; Potassium 4.8; Sodium 144; TSH 1.46  Recent Lipid Panel    Component Value Date/Time   CHOL 130 06/08/2022 1105   TRIG 189 (H) 06/08/2022 1105   HDL 35 (L) 06/08/2022 1105   CHOLHDL 3.7 06/08/2022 1105   CHOLHDL 4.2 01/09/2022 1604   VLDL 30 09/26/2016 1505   LDLCALC 63 06/08/2022 1105   LDLCALC 34 01/09/2022 1604   Cardiac Studies & Procedures  ECHOCARDIOGRAM  ECHOCARDIOGRAM LIMITED 08/31/2022  Narrative ECHOCARDIOGRAM LIMITED REPORT    Patient Name:   Olivia Walsh Date of Exam: 08/31/2022 Medical Rec #:  [id number]           Height:       66.5 in Accession #:    [id number]          Weight:        151.6 lb Date of Birth:  06/05/1960           BSA:          1.788 m Patient Age:    62 years            BP:           122/82 mmHg Patient Gender: F                   HR:           50 bpm. Exam Location:  [address]  Procedure: 2D Echo, Limited Echo, Cardiac Doppler and Limited Color Doppler  Indications:    I42.0 HOCM  History:        Patient has prior history of Echocardiogram examinations, most recent 06/08/2022. Hypertrophic Cardiomyopathy, CAD, Prior CABG, PAD; Risk Factors:Family History of Coronary Artery Disease, Hypertension, Dyslipidemia and Current Smoker. HOCM- Camzyos 2.5mg .  Sonographer:    Farrel Conners RDCS Referring Phys: [facility] A Aleira Deiter  IMPRESSIONS   1. Left ventricular ejection fraction, by estimation, is 60 to 65%. The left ventricle has normal function. Severe asymmetric hypertrophy of the basal septum. No significant LV outflow gradient was measured at rest or with Valsalva. The left ventricle demonstrates regional wall motion abnormalities with basal inferior severe hypokinesis. Left ventricular diastolic parameters are consistent with Grade I diastolic dysfunction (impaired relaxation). 2. Right ventricular systolic function is normal. The right ventricular size is normal. There is normal pulmonary artery systolic pressure. The estimated right ventricular systolic pressure is 18.1 mmHg. 3. The mitral valve is normal in structure, no mitral valve SAM was noted. Mild mitral valve regurgitation. No evidence of mitral stenosis. 4. The aortic valve is tricuspid. There is mild calcification of the aortic valve. No aortic stenosis is present. 5. The inferior vena cava is normal in size with greater than 50% respiratory variability, suggesting right atrial pressure of 3 mmHg. 6. Aortic dilatation noted. There is mild dilatation of the ascending aorta, measuring 38 mm.  FINDINGS Left Ventricle: Left ventricular ejection fraction, by estimation, is 60 to 65%.  The left ventricle has normal function. The left ventricle demonstrates regional wall motion abnormalities. There is severe asymmetric left ventricular hypertrophy of the basal-septal segment. Left ventricular diastolic parameters are consistent with Grade I diastolic dysfunction (impaired relaxation).  Right Ventricle: The right ventricular size is normal. No increase in right ventricular wall thickness. Right ventricular systolic function is normal. There is normal pulmonary artery systolic pressure. The tricuspid regurgitant velocity is 1.94 m/s, and with an assumed right atrial pressure of 3 mmHg, the estimated right ventricular systolic pressure is 18.1 mmHg.  Left Atrium: Left atrial size was normal in size.  Right Atrium: Right atrial size was normal in size.  Mitral Valve: The mitral valve is normal in structure. Mild mitral valve regurgitation. No evidence of mitral valve stenosis.  Tricuspid Valve: The tricuspid valve is normal in structure. Tricuspid valve regurgitation is trivial.  Aortic Valve: The aortic valve is tricuspid. There is mild calcification of the aortic valve. No aortic stenosis is present.  Pulmonic Valve: The pulmonic valve was normal in structure. Pulmonic valve regurgitation is trivial.  Aorta: The aortic root is normal in size and structure and aortic dilatation noted. There is mild dilatation of the ascending aorta, measuring 38 mm.  Venous: The inferior vena cava is normal in size with greater than 50% respiratory variability, suggesting right atrial pressure of 3 mmHg.  IAS/Shunts: No atrial level shunt detected by color flow Doppler.  LEFT VENTRICLE PLAX 2D LVOT diam:     2.40 cm   Diastology LV SV:         119       LV e' medial:    5.55 cm/s LV SV Index:   66        LV E/e' medial:  10.7 LVOT Area:     4.52 cm  LV e' lateral:   8.49 cm/s LV E/e' lateral: 7.0   RIGHT VENTRICLE RV Basal diam:  3.50 cm RV S prime:     7.94 cm/s TAPSE (M-mode):  1.7 cm RVSP:           18.1 mmHg  LEFT ATRIUM           Index        RIGHT ATRIUM           Index LA Vol (A4C): 40.1 ml 22.43 ml/m  RA Pressure: 3.00 mmHg RA Area:     13.50 cm RA Volume:   34.80 ml  19.47 ml/m AORTIC VALVE LVOT Vmax:   103.87 cm/s LVOT Vmean:  68.467 cm/s LVOT VTI:    0.262 m  AORTA Ao Root diam: 3.50 cm Ao Asc diam:  3.80 cm  MITRAL VALVE               TRICUSPID VALVE MV Area (PHT)  cm         TR Peak grad:   15.1 mmHg MV Decel Time: 323 msec    TR Vmax:        194.00 cm/s MV E velocity: 59.20 cm/s  Estimated RAP:  3.00 mmHg MV A velocity: 80.70 cm/s  RVSP:           18.1 mmHg MV E/A ratio:  0.73 SHUNTS Systemic VTI:  0.26 m Systemic Diam: 2.40 cm  Dalton McleanMD Electronically signed by Wilfred Lacy Signature Date/Time: 08/31/2022/6:19:41 PM    Final    MONITORS  LONG TERM MONITOR (3-14 DAYS) 10/03/2021  Narrative   Patient had a minimum heart rate of 41 bpm, maximum heart rate of 177 bpm, and average heart rate of 51 bpm.   Predominant underlying rhythm was sinus rhythm.   Isolated PACs were rare (<1.0%).   Isolated PVCs were occasional (1.9%).   No triggered and diary events.  HCM with out evidence of NSVT.    CARDIAC MRI  MR CARDIAC MORPHOLOGY W WO CONTRAST 06/14/2021  Narrative CLINICAL DATA:  Hypertrophic Cardiomyopathy  EXAM: CARDIAC MRI  TECHNIQUE: The patient was scanned on a 1.5 Tesla Siemens magnet. A dedicated cardiac coil was used. Functional imaging was done using Fiesta sequences. 2,3, and 4 chamber views were done to assess for RWMA's. Modified Simpson's rule using a short axis stack was used to calculate an ejection fraction on a dedicated work [employer]. The patient received 8 cc of Gadavist. After 10 minutes inversion recovery sequences were used to assess for infiltration and scar tissue.  CONTRAST:  Gadavist  FINDINGS: Moderate LAE. Normal RA/RV. No ASD/PFO. Normal ascending  thoracic aorta  3.4 cm Severe LV hypertrophy asymmetric with septal thickness 16 mm and lateral wall only 8 mm There is SAM with turbulence in the LVOT. Moderate appearing MR Morphologic appearance consistent with obstructive hypertrophic cardiomyopathy Delayed gadolinium images show no high risk features with no infiltration, scar or infarct particularly in septum No significant pericardial effusion Quantitative LVEF 59% (EDV 130 cc ESV 54 cc SV 77 cc )  T1 1056 msec  ECV 30%  T2 50 msec  IMPRESSION: 1. Severe asymmetric septal hypertrophy thickness 16 mm compared to lateral wall 8 mm  2.  Normal LV size and function EF 59% no RWMAls  3. SAM with LVOT turbulence and moderate appearing MR consistent with obstructive hypertrophic cardiomyopathy  4.  Normal parametric measures see above  5. No high risk gadolinium uptake in LV particularly septum no infarct scar/infiltration noted  6.  Moderate LAE  Charlton Haws   Electronically Signed By: Charlton Haws M.D. On: 06/14/2021 12:32             Physical Exam:    VS:  BP (!) 148/76   Pulse 60   Ht 5' 6.5" (1.689 m)   Wt 141 lb 6.4 oz (64.1 kg)   SpO2 97%   BMI 22.48 kg/m     Wt Readings from Last 3 Encounters:  09/18/22 141 lb 6.4 oz (64.1 kg)  07/17/22 151 lb 9.6 oz (68.8 kg)  04/18/22 149 lb 9.6 oz (67.9 kg)    Gen: No distress  Neck: No JVD Cardiac: No Rubs or Gallops, no systolic murmur, regular bradycardia Respiratory: Clear to auscultation bilaterally, normal effort, normal  respiratory rate GI: Soft, nontender, non-distended  Integument: Skin feels warm Neuro:  At time of evaluation, alert and oriented to person/place/time/situation  Psych: Normal affect, patient feels OK  ASSESSMENT:    1. Obstructive hypertrophic cardiomyopathy (HCC)   2. Coronary artery disease involving native coronary artery of native heart without angina pectoris     PLAN:    Hypertrophic Cardiomyopathy Hx of  transaminitis - Gradient type: On BB and diuretics she has a severe LVOT gradient diuretics have been down titrated to as minimal as possible (PRN) - NYHA Class I - on mavacamten 2.5 mg with 12 week maintenance imaging - transaminitis has resolved; minimized GOODY powder - sister still needs screening - with PVCs; I had originally planned to do PVC screening; she is dealing with her husbands new diagnosis; will perform this imaging in January of 2025. - low threshold to decrease metoprolo dose   Non HCM Care CAD s/p CABG; Hld, Aortic atherosclerosis- LDL at goal  Tobacco abuse- pre-contemplative HTN- elevated BP today in the setting of acute grief, no change in therapy  One year with me    Medication Adjustments/Labs and Tests Ordered: Current medicines are reviewed at length with the patient today.  Concerns regarding medicines are outlined above.  No orders of the defined types were placed in this encounter.  No orders of the defined types were placed in this encounter.   Patient Instructions  Medication Instructions:  No changes today *If you need a refill on your cardiac medications before your next appointment, please call your pharmacy*   Lab Work: none   Testing/Procedures: none   Follow-Up: At [facility], you and your health needs are our priority.  As part of our continuing mission to provide you with exceptional heart care, we have created designated Provider Care Teams.  These Care Teams include your primary Cardiologist (  physician) and Advanced Practice Providers (APPs -  Physician Assistants and Nurse Practitioners) who all work together to provide you with the care you need, when you need it.   Your next appointment:   12 month(s)  Provider:   Dr. Izora Ribas    Signed, Christell Constant, MD  09/18/2022 9:53 AM    [facility]

## 2022-09-18 ENCOUNTER — Encounter: Payer: Self-pay | Admitting: Internal Medicine

## 2022-09-18 ENCOUNTER — Ambulatory Visit: Payer: 59 | Admitting: Internal Medicine

## 2022-09-18 VITALS — BP 148/76 | HR 60 | Ht 66.5 in | Wt 141.4 lb

## 2022-09-18 DIAGNOSIS — I2510 Atherosclerotic heart disease of native coronary artery without angina pectoris: Secondary | ICD-10-CM | POA: Diagnosis not present

## 2022-09-18 DIAGNOSIS — I421 Obstructive hypertrophic cardiomyopathy: Secondary | ICD-10-CM | POA: Diagnosis not present

## 2022-09-18 NOTE — Patient Instructions (Signed)
Medication Instructions:  No changes today *If you need a refill on your cardiac medications before your next appointment, please call your pharmacy*   Lab Work: none   Testing/Procedures: none   Follow-Up: At [facility], you and your health needs are our priority.  As part of our continuing mission to provide you with exceptional heart care, we have created designated Provider Care Teams.  These Care Teams include your primary Cardiologist (physician) and Advanced Practice Providers (APPs -  Physician Assistants and Nurse Practitioners) who all work together to provide you with the care you need, when you need it.   Your next appointment:   12 month(s)  Provider:   Dr. Izora Ribas

## 2022-09-20 IMAGING — MR MR CARD MORPHOLOGY WO/W CM
45 of 48 series · 45 of 48 positions shown · IV contrast (Contrast agent)
Comparison: none

CLINICAL DATA: Hypertrophic Cardiomyopathy

EXAM:
CARDIAC MRI
TECHNIQUE: The patient was scanned on a 1.5 Tesla Siemens magnet. A dedicated
cardiac coil was used. Functional imaging was done using Fiesta
sequences. [DATE], and 4 chamber views were done to assess for RWMA's.
Modified Chunfai rule using a short axis stack was used to
calculate an ejection fraction on a dedicated work station using
[employer] software. The patient received 8 cc of Gadavist. After 10
minutes inversion recovery sequences were used to assess for
infiltration and scar tissue.
CONTRAST:  Gadavist

[Series 4: t2_haste_db_tra_bh · axial · 8.0mm · 1.41mm/px · 1 of 16 slices shown]
[im 1/16]
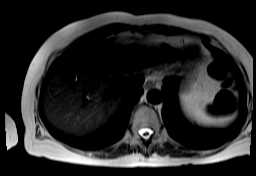

[Series 9: bSSFP · oblique · 8.0mm · 1.61mm/px · 1 of 25 slices shown (1 of 18)]
[im 1/25]
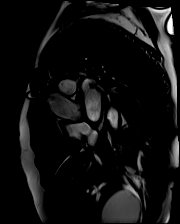

[Series 10: bSSFP · oblique · 8.0mm · 1.61mm/px · 1 of 25 slices shown (2 of 18)]
[im 1/25]
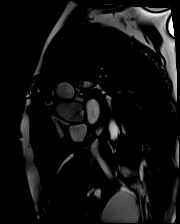

[Series 11: bSSFP · oblique · 8.0mm · 1.61mm/px · 1 of 25 slices shown (3 of 18)]
[im 1/25]
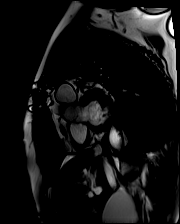

[Series 12: bSSFP · oblique · 8.0mm · 1.61mm/px · 1 of 25 slices shown (4 of 18)]
[im 1/25]
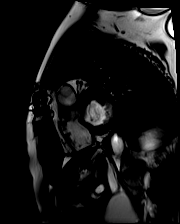

[Series 13: bSSFP · oblique · 8.0mm · 1.61mm/px · 1 of 25 slices shown (5 of 18)]
[im 1/25]
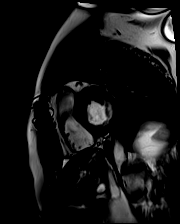

[Series 14: bSSFP · oblique · 8.0mm · 1.61mm/px · 1 of 25 slices shown (6 of 18)]
[im 1/25]
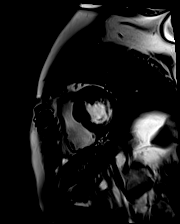

[Series 15: bSSFP · oblique · 8.0mm · 1.61mm/px · 1 of 25 slices shown (7 of 18)]
[im 1/25]
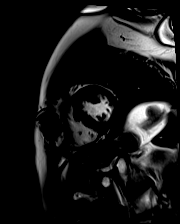

[Series 16: bSSFP · oblique · 8.0mm · 1.61mm/px · 1 of 25 slices shown (8 of 18)]
[im 1/25]
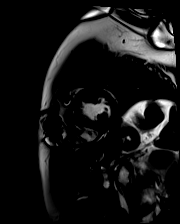

[Series 17: bSSFP · oblique · 8.0mm · 1.61mm/px · 1 of 25 slices shown (9 of 18)]
[im 1/25]
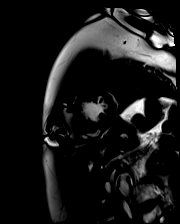

[Series 18: bSSFP · oblique · 8.0mm · 1.61mm/px · 1 of 25 slices shown (10 of 18)]
[im 1/25]
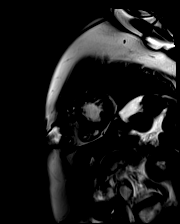

[Series 19: bSSFP · oblique · 8.0mm · 1.61mm/px · 1 of 25 slices shown (11 of 18)]
[im 1/25]
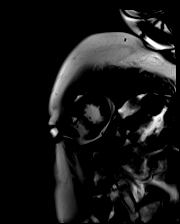

[Series 20: bSSFP · oblique · 8.0mm · 1.61mm/px · 1 of 25 slices shown (12 of 18)]
[im 1/25]
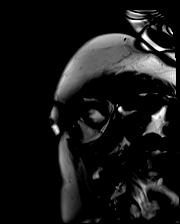

[Series 21: bSSFP · oblique · 8.0mm · 1.61mm/px · 1 of 25 slices shown (13 of 18)]
[im 1/25]
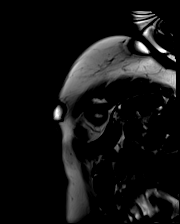

[Series 22: bSSFP · oblique · 8.0mm · 1.61mm/px · 1 of 25 slices shown (14 of 18)]
[im 1/25]
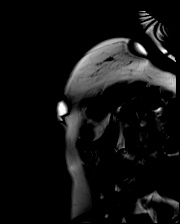

[Series 23: bSSFP · oblique · 8.0mm · 1.61mm/px · 1 of 25 slices shown (15 of 18)]
[im 1/25]
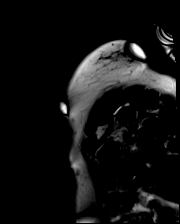

[Series 24: cine_trufi_short axis_cs_2_shot · oblique · 8.0mm · 1.48mm/px · 1 of 25 slices shown (1 of 18)]
[im 1/25]
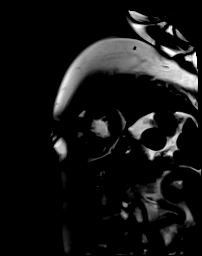

[Series 24: cine_trufi_short axis_cs_2_shot · oblique · 8.0mm · 1.48mm/px · 1 of 25 slices shown (2 of 18)]
[im 1/25]
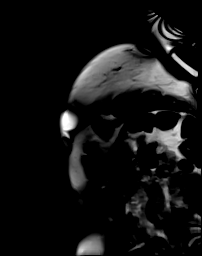

[Series 24: cine_trufi_short axis_cs_2_shot · oblique · 8.0mm · 1.48mm/px · 1 of 25 slices shown (3 of 18)]
[im 1/25]
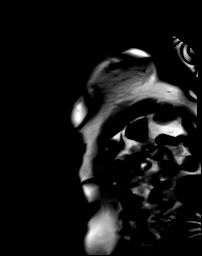

[Series 24: cine_trufi_short axis_cs_2_shot · oblique · 8.0mm · 1.48mm/px · 1 of 25 slices shown (4 of 18)]
[im 1/25]
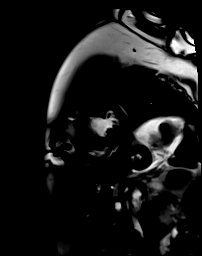

[Series 24: cine_trufi_short axis_cs_2_shot · oblique · 8.0mm · 1.48mm/px · 1 of 25 slices shown (5 of 18)]
[im 1/25]
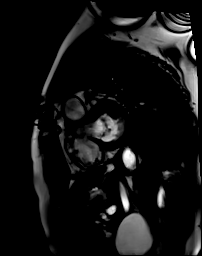

[Series 24: cine_trufi_short axis_cs_2_shot · oblique · 8.0mm · 1.48mm/px · 1 of 25 slices shown (6 of 18)]
[im 1/25]
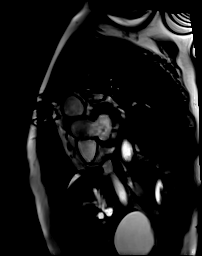

[Series 24: cine_trufi_short axis_cs_2_shot · oblique · 8.0mm · 1.48mm/px · 1 of 25 slices shown (7 of 18)]
[im 1/25]
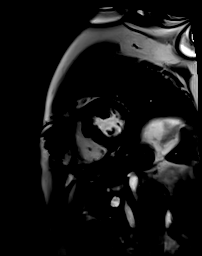

[Series 24: cine_trufi_short axis_cs_2_shot · oblique · 8.0mm · 1.48mm/px · 1 of 25 slices shown (8 of 18)]
[im 1/25]
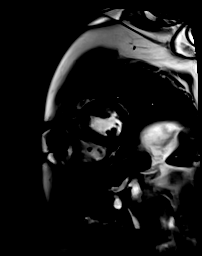

[Series 24: cine_trufi_short axis_cs_2_shot · oblique · 8.0mm · 1.48mm/px · 1 of 25 slices shown (9 of 18)]
[im 1/25]
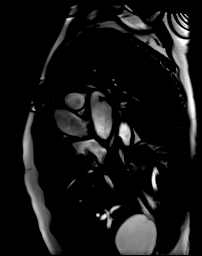

[Series 24: cine_trufi_short axis_cs_2_shot · oblique · 8.0mm · 1.48mm/px · 1 of 25 slices shown (10 of 18)]
[im 1/25]
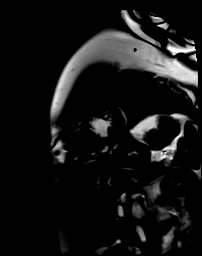

[Series 24: cine_trufi_short axis_cs_2_shot · oblique · 8.0mm · 1.48mm/px · 1 of 25 slices shown (11 of 18)]
[im 1/25]
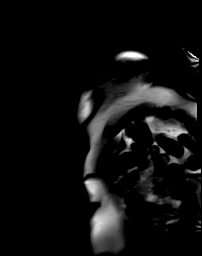

[Series 24: cine_trufi_short axis_cs_2_shot · oblique · 8.0mm · 1.48mm/px · 1 of 25 slices shown (12 of 18)]
[im 1/25]
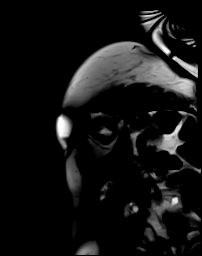

[Series 24: cine_trufi_short axis_cs_2_shot · oblique · 8.0mm · 1.48mm/px · 1 of 25 slices shown (13 of 18)]
[im 1/25]
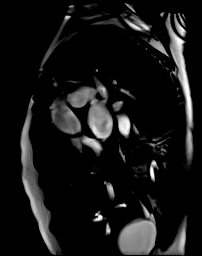

[Series 24: cine_trufi_short axis_cs_2_shot · oblique · 8.0mm · 1.48mm/px · 1 of 25 slices shown (14 of 18)]
[im 1/25]
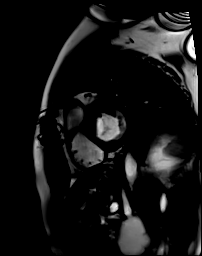

[Series 24: cine_trufi_short axis_cs_2_shot · oblique · 8.0mm · 1.48mm/px · 1 of 25 slices shown (15 of 18)]
[im 1/25]
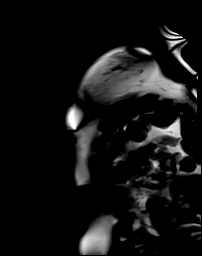

[Series 24: cine_trufi_short axis_cs_2_shot · oblique · 8.0mm · 1.48mm/px · 1 of 25 slices shown (16 of 18)]
[im 1/25]
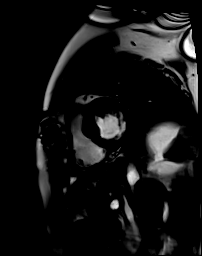

[Series 24: cine_trufi_short axis_cs_2_shot · oblique · 8.0mm · 1.48mm/px · 1 of 25 slices shown (17 of 18)]
[im 1/25]
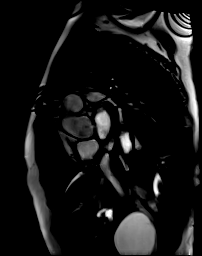

[Series 24: cine_trufi_short axis_cs_2_shot · oblique · 8.0mm · 1.48mm/px · 1 of 25 slices shown (18 of 18)]
[im 1/25]
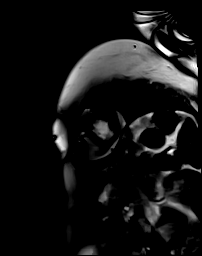

[Series 25: bSSFP · oblique · 6.0mm · 1.41mm/px · 1 of 25 slices shown (16 of 18)]
[im 1/25]
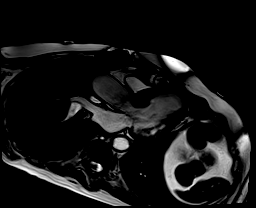

[Series 26: bSSFP · oblique · 6.0mm · 1.41mm/px · 1 of 25 slices shown (17 of 18)]
[im 1/25]
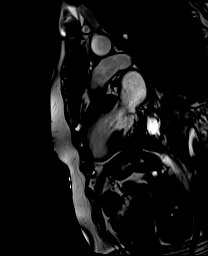

[Series 27: bSSFP · oblique · 6.0mm · 1.41mm/px · 1 of 25 slices shown (18 of 18)]
[im 1/25]
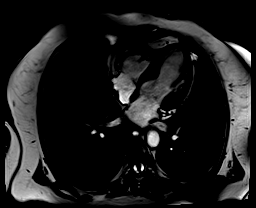

[Series 28: (id)_long_t1 · oblique · 8.0mm · 1.56mm/px · 1 of 24 slices shown]
[im 1/24]
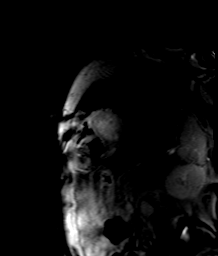

[Series 29: (id)_long_t1_moco · oblique · 8.0mm · 1.56mm/px · 1 of 24 slices shown]
[im 1/24]
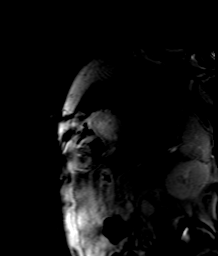

[Series 30: (id)_long_t1_moco_t1 · oblique · 8.0mm · 1.56mm/px · 1 of 6 slices shown]
[im 1/6]
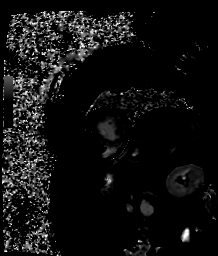

[Series 32: (id)_trufi · oblique · 8.0mm · 2.08mm/px · 1 of 9 slices shown]
[im 1/9]
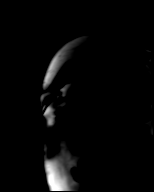

[Series 33: (id)_trufi_moco · oblique · 8.0mm · 2.08mm/px · 1 of 9 slices shown]
[im 1/9]
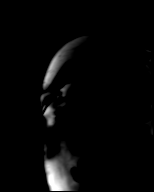

[Series 34: (id)_trufi_moco_t2 · oblique · 8.0mm · 2.08mm/px · 1 of 3 slices shown]
[im 1/3]
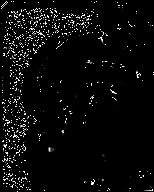

[Series 36: cine rvot · sagittal · 6.0mm · 1.41mm/px · 1 of 25 slices shown]
[im 1/25]
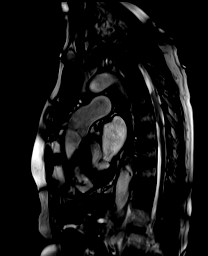

[Series 37: cor rvot · oblique · 6.0mm · 1.41mm/px · 1 of 25 slices shown]
[im 1/25]
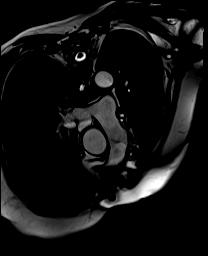

[45 of 48 positions shown; findings below may reference images not displayed]

FINDINGS: Moderate LAE. Normal RA/RV. No ASD/PFO. Normal ascending thoracic
aorta 3.4 cm Severe LV hypertrophy asymmetric with septal thickness
16 mm and lateral wall only 8 mm There is RANK FRANK with turbulence in the
LVOT. Moderate appearing MR Morphologic appearance consistent with
obstructive hypertrophic cardiomyopathy Delayed gadolinium images
show no high risk features with no infiltration, scar or infarct
particularly in septum No significant pericardial effusion
Quantitative LVEF 59% (EDV 130 cc ESV 54 cc SV 77 cc )

T1 3198 msec

ECV 30%

T2 50 msec
IMPRESSION: 1. Severe asymmetric septal hypertrophy thickness 16 mm compared to
lateral wall 8 mm

2.  Normal LV size and function EF 59% no RWMAls

3. RANK FRANK with LVOT turbulence and moderate appearing MR consistent
with obstructive hypertrophic cardiomyopathy

4.  Normal parametric measures see above

5. No high risk gadolinium uptake in LV particularly septum no
infarct scar/infiltration noted

6.  Moderate SDC

[place]

## 2022-09-23 ENCOUNTER — Other Ambulatory Visit: Payer: Self-pay | Admitting: Nurse Practitioner

## 2022-09-23 DIAGNOSIS — E039 Hypothyroidism, unspecified: Secondary | ICD-10-CM

## 2022-09-26 ENCOUNTER — Other Ambulatory Visit: Payer: Self-pay | Admitting: *Deleted

## 2022-09-26 DIAGNOSIS — I739 Peripheral vascular disease, unspecified: Secondary | ICD-10-CM

## 2022-10-01 ENCOUNTER — Telehealth: Payer: Self-pay

## 2022-10-01 ENCOUNTER — Other Ambulatory Visit: Payer: Self-pay | Admitting: Urology

## 2022-10-01 ENCOUNTER — Other Ambulatory Visit (HOSPITAL_COMMUNITY): Payer: Self-pay

## 2022-10-01 ENCOUNTER — Telehealth: Payer: Self-pay | Admitting: Internal Medicine

## 2022-10-01 DIAGNOSIS — N281 Cyst of kidney, acquired: Secondary | ICD-10-CM

## 2022-10-01 NOTE — Telephone Encounter (Signed)
Pharmacy Patient Advocate Encounter   Received notification from Physician's Office that prior authorization for REPATHA is required/requested.   Insurance verification completed.   The patient is insured through [facility] .   Per test claim: PA required; PA submitted to [facility] via CoverMyMeds Key/confirmation #/EOC [id number] Status is pending

## 2022-10-01 NOTE — Telephone Encounter (Signed)
Pharmacy Patient Advocate Encounter  Received notification from [facility] that Prior Authorization for REPATHA has been APPROVED from 10/01/22 to 10/01/23. Ran test claim, Copay is $0

## 2022-10-01 NOTE — Telephone Encounter (Signed)
*  STAT* If patient is at the pharmacy, call can be transferred to refill team.   1. Which medications need to be refilled? (please list name of each medication and dose if known) need a prior authorization for Repathat   2. Would you like to learn more about the convenience, safety, & potential cost savings by using the [facility]?     3. Are you open to using the [facility] (Type [facility].   4. Which pharmacy/location (including street and city if local pharmacy) is medication to be sent to?[facility] [address], [place]   5. Do they need a 30 day or 90 day supply?  2 pens

## 2022-10-01 NOTE — Telephone Encounter (Signed)
PA request has been Submitted. New Encounter created for follow up. For additional info see Pharmacy Prior Auth telephone encounter from 10/01/22.

## 2022-10-04 ENCOUNTER — Ambulatory Visit: Payer: 59 | Admitting: Vascular Surgery

## 2022-10-04 ENCOUNTER — Encounter (HOSPITAL_COMMUNITY): Payer: 59

## 2022-10-12 ENCOUNTER — Other Ambulatory Visit: Payer: Self-pay | Admitting: Nurse Practitioner

## 2022-10-12 DIAGNOSIS — J309 Allergic rhinitis, unspecified: Secondary | ICD-10-CM

## 2022-10-18 ENCOUNTER — Ambulatory Visit: Payer: 59 | Admitting: Vascular Surgery

## 2022-10-18 ENCOUNTER — Ambulatory Visit (HOSPITAL_COMMUNITY): Payer: 59

## 2022-11-02 ENCOUNTER — Other Ambulatory Visit: Payer: Self-pay | Admitting: Nurse Practitioner

## 2022-11-02 DIAGNOSIS — E039 Hypothyroidism, unspecified: Secondary | ICD-10-CM

## 2022-11-07 ENCOUNTER — Encounter: Payer: Self-pay | Admitting: Urology

## 2022-11-08 ENCOUNTER — Ambulatory Visit (HOSPITAL_COMMUNITY): Payer: 59

## 2022-11-13 ENCOUNTER — Other Ambulatory Visit: Payer: 59

## 2022-11-14 ENCOUNTER — Ambulatory Visit (HOSPITAL_COMMUNITY): Payer: 59 | Attending: Internal Medicine

## 2022-11-14 DIAGNOSIS — I421 Obstructive hypertrophic cardiomyopathy: Secondary | ICD-10-CM | POA: Insufficient documentation

## 2022-11-14 LAB — ECHOCARDIOGRAM LIMITED
Area-P 1/2: 2.24 cm2
S' Lateral: 2.5 cm

## 2022-11-15 ENCOUNTER — Telehealth: Payer: Self-pay | Admitting: Internal Medicine

## 2022-11-15 DIAGNOSIS — I421 Obstructive hypertrophic cardiomyopathy: Secondary | ICD-10-CM

## 2022-11-15 MED ORDER — MAVACAMTEN 2.5 MG PO CAPS: 2.5000 mg | ORAL_CAPSULE | Freq: Every day | ORAL | 2 refills | Status: DC

## 2022-11-15 NOTE — Addendum Note (Signed)
Addended by: Macie Burows on: 11/15/2022 09:46 AM   Modules accepted: Orders

## 2022-11-15 NOTE — Telephone Encounter (Signed)
PSF updated on [facility] portal.  Camzyos 2.5 mg refilled sent to specialty pharmacy on file. 12 week limited echo due 12/4-12/11.

## 2022-11-15 NOTE — Telephone Encounter (Signed)
Called Olivia Walsh for [facility] follow up Patient notes that she is doing great.   Since last visit notes that things are busy at home, she is still smoking and not planning on quitting at this tie . No new medications. Rare Goody Powder  No chest pain or pressure .  No SOB/DOE and no PND/Orthopnea.  No weight gain or leg swelling.  No palpitations or syncope .  - LVEF 65% - LVOT gradient undetectable (5 mm Hg at best estimate)  Continue 2.5 mg dose. 12 week follow up echo  Riley Lam, MD FASE [facility] Cardiologist [facility]  [address] [place], [address] [place], [place] [address] [phone]  8:58 AM

## 2022-11-19 ENCOUNTER — Encounter: Payer: Self-pay | Admitting: Internal Medicine

## 2022-11-19 NOTE — Telephone Encounter (Signed)
Received fax that pt has been triaged to pt assistance program for Camzyos.

## 2022-11-23 ENCOUNTER — Other Ambulatory Visit: Payer: Self-pay | Admitting: Nurse Practitioner

## 2022-11-27 ENCOUNTER — Encounter (HOSPITAL_COMMUNITY): Payer: 59

## 2022-11-27 ENCOUNTER — Ambulatory Visit: Payer: 59 | Admitting: Vascular Surgery

## 2022-12-18 ENCOUNTER — Encounter: Payer: Self-pay | Admitting: Vascular Surgery

## 2022-12-18 ENCOUNTER — Ambulatory Visit (INDEPENDENT_AMBULATORY_CARE_PROVIDER_SITE_OTHER): Payer: 59 | Admitting: Vascular Surgery

## 2022-12-18 ENCOUNTER — Ambulatory Visit (HOSPITAL_COMMUNITY)
Admission: RE | Admit: 2022-12-18 | Discharge: 2022-12-18 | Disposition: A | Discharge status: Home / Self Care | Payer: 59 | Source: Ambulatory Visit | Attending: Vascular Surgery | Admitting: Vascular Surgery

## 2022-12-18 VITALS — BP 178/95 | HR 55 | Temp 97.6°F | Wt 135.0 lb

## 2022-12-18 DIAGNOSIS — I739 Peripheral vascular disease, unspecified: Secondary | ICD-10-CM

## 2022-12-18 LAB — VAS US ABI WITH/WO TBI
Left ABI: 0.98
Right ABI: 0.58

## 2022-12-18 NOTE — Progress Notes (Signed)
Patient name: Olivia Walsh MRN: [id number] DOB: 1960/07/15 Sex: female  REASON FOR CONSULT: 1 year follow-up PAD  HPI: Olivia Walsh is a 62 y.o. female, with history of CAD, CKD, hypertension, hyperlipidemia, tobacco abuse that presents for follow-up of her PAD.  Previously seen by Dr. Edilia Bo on 09/28/2021 with paresthesias on the bottom of both feet.  This prompted noninvasive imaging which showed abnormal flow in the right leg with right iliac occlusive disease however she had no lower extremity symptoms including no claudication rest pain or wounds and she was managed conservatively.  He felt most of her symptoms are related to neuropathy.  On follow-up today symptoms are the same.  Still has this tingling feeling on the bottom of both feet.  Past Medical History:  Diagnosis Date   Aneurysm (HCC)    CAD (coronary artery disease) 2002   hx CABG 2002, Cath 2011 w/ occluded native vessels but LIMA-LAD, SVG-pLAD, SVG-Diag, SVG-OM patent, SVG-RCA occluded   Cholelithiasis    CKD (chronic kidney disease) stage 3, GFR 30-59 ml/min (HCC)    History of basal cell carcinoma excision 12/20/2017   Hyperlipidemia LDL goal <70    Hypertension    Hypothyroidism    PAD (peripheral artery disease) (HCC)    Renal cyst    Tobacco abuse    Vitamin D deficiency     Past Surgical History:  Procedure Laterality Date   CATARACT EXTRACTION, BILATERAL Bilateral 2020   CEREBRAL ANEURYSM REPAIR Right 2012   CEREBRAL ANEURYSM REPAIR Right 2005   CORONARY ARTERY BYPASS GRAFT  2002   Dr Laneta Simmers   EMBOLIZATION  2005   Embolization of right PICA aneurysm    Family History  Problem Relation Age of Onset   Brain cancer Father        brain   Leukemia Sister 10   Heart disease Mother    Heart attack Mother 72   Heart disease Brother    Heart attack Brother 39   Heart attack Maternal Grandfather    Heart disease Brother 60   Multiple sclerosis Sister     SOCIAL HISTORY: Social  History   Socioeconomic History   Marital status: Married    Spouse name: Not on file   Number of children: 0   Years of education: Not on file   Highest education level: Not on file  Occupational History   Not on file  Tobacco Use   Smoking status: Every Day    Current packs/day: 1.00    Average packs/day: 1 pack/day for 49.8 years (49.8 ttl pk-yrs)    Types: Cigarettes    Start date: 59   Smokeless tobacco: Never   Tobacco comments:    1/2-2 ppd   Vaping Use   Vaping status: Never Used  Substance and Sexual Activity   Alcohol use: No    Comment: rarely, 1-2 per year   Drug use: No   Sexual activity: Yes    Partners: Male    Birth control/protection: Post-menopausal  Other Topics Concern   Not on file  Social History Narrative   Not on file   Social Determinants of Health   Financial Resource Strain: Not on file  Food Insecurity: Not on file  Transportation Needs: Not on file  Physical Activity: Inactive (04/02/2018)   Exercise Vital Sign    Days of Exercise per Week: 0 days    Minutes of Exercise per Session: 0 min  Stress: Stress Concern Present (04/02/2018)  [employer] of Occupational Health - Occupational Stress Questionnaire    Feeling of Stress : To some extent  Social Connections: Not on file  Intimate Partner Violence: Not on file    Allergies  Allergen Reactions   Ace Inhibitors Cough   Penicillins Other (See Comments)    "happened years ago" (reaction not recalled)   Prednisone Other (See Comments)    Dysphoria    Current Outpatient Medications  Medication Sig Dispense Refill   clopidogrel (PLAVIX) 75 MG tablet TAKE 1 TABLET  DAILY TO  PREVENT BLOOD CLOTS                                                     /                                               TAKE                                 BY                                          MOUTH 90 tablet 3   Evolocumab (REPATHA SURECLICK) 140 MG/ML SOAJ Inject 140 mg into the skin every 14  (fourteen) days. 2 mL 11   fluticasone (FLONASE) 50 MCG/ACT nasal spray Place 2 sprays into both nostrils daily. 15.8 mL 2   furosemide (LASIX) 20 MG tablet TAKE 1 TABLET(20 MG) BY MOUTH DAILY AS NEEDED 90 tablet 1   levETIRAcetam (KEPPRA) 500 MG tablet TAKE 1 TABLET BY MOUTH TWICE DAILY TO PREVENT SEIZURES 180 tablet 3   Levocetirizine Dihydrochloride (XYZAL PO) Take by mouth.     levothyroxine (SYNTHROID) 50 MCG tablet TAKE 1 TABLET(50 MCG) BY MOUTH DAILY BEFORE BREAKFAST 90 tablet 2   mavacamten (CAMZYOS) 2.5 MG CAPS capsule Take 1 capsule (2.5 mg total) by mouth daily. 35 capsule 2   metoprolol succinate (TOPROL-XL) 25 MG 24 hr tablet TAKE 1 TABLET(25 MG) BY MOUTH DAILY 90 tablet 3   montelukast (SINGULAIR) 10 MG tablet TAKE 1 TABLET(10 MG) BY MOUTH DAILY 30 tablet 2   potassium chloride (KLOR-CON M) 10 MEQ tablet TAKE 1 TABLET(10 MEQ) BY MOUTH DAILY 90 tablet 3   rosuvastatin (CRESTOR) 40 MG tablet TAKE 1 TABLET BY MOUTH DAILY FOR CHOLESTEROL 90 tablet 3   No current facility-administered medications for this visit.    REVIEW OF SYSTEMS:  [X]  denotes positive finding, [ ]  denotes negative finding Cardiac  Comments:  Chest pain or chest pressure:    Shortness of breath upon exertion:    Short of breath when lying flat:    Irregular heart rhythm:        Vascular    Pain in calf, thigh, or hip brought on by ambulation:    Pain in feet at night that wakes you up from your sleep:     Blood clot in your veins:    Leg swelling:         Pulmonary    Oxygen at home:  Productive cough:     Wheezing:         Neurologic    Sudden weakness in arms or legs:     Sudden numbness in arms or legs:     Sudden onset of difficulty speaking or slurred speech:    Temporary loss of vision in one eye:     Problems with dizziness:         Gastrointestinal    Blood in stool:     Vomited blood:         Genitourinary    Burning when urinating:     Blood in urine:        Psychiatric     Major depression:         Hematologic    Bleeding problems:    Problems with blood clotting too easily:        Skin    Rashes or ulcers:        Constitutional    Fever or chills:      PHYSICAL EXAM: Vitals:   12/18/22 1148  BP: (!) 178/95  Pulse: (!) 55  Temp: 97.6 F (36.4 C)  TempSrc: Temporal  SpO2: 96%  Weight: 135 lb (61.2 kg)    GENERAL: The patient is a well-nourished female, in no acute distress. The vital signs are documented above. CARDIAC: There is a regular rate and rhythm.  VASCULAR:  Difficult to appreciate right femoral pulse Left femoral pulse palpable Left DP palpable No palpable right pedal pulses No tissue loss PULMONARY: No respiratory distress. ABDOMEN: Soft and non-tender. MUSCULOSKELETAL: There are no major deformities or cyanosis. NEUROLOGIC: No focal weakness or paresthesias are detected. SKIN: There are no ulcers or rashes noted. PSYCHIATRIC: The patient has a normal affect.  DATA:   ABIs today are 0.58 on the right biphasic and 0.98 on the left biphasic (previously 0.63 right and 0.89 left)  Assessment/Plan:  62 y.o. female, with history of CAD, CKD, hypertension, hyperlipidemia, tobacco abuse that presents for 1 year follow-up of her PAD.  Previously seen by Dr. Edilia Bo on 09/28/2021 with paresthesias on the bottom of both feet.  This prompted noninvasive imaging which showed abnormal flow in the right leg with right iliac occlusive disease however she had no lower extremity symptoms including no claudication, rest pain, or wounds and she was managed conservatively.  Still has no significant lower extremity symptoms that would correlate with claudication, rest pain or tissue loss.  I agree that this is likely neuropathy as she has tingling on the bottom of both feet.  She has an easily palpable pulse in the left foot.  I will see her again in 1 year with repeat ABIs.  Discussed the importance of smoking cessation    Olivia Shelling,  MD [facility] Office: [phone]

## 2023-01-08 ENCOUNTER — Other Ambulatory Visit: Payer: Self-pay

## 2023-01-08 DIAGNOSIS — I739 Peripheral vascular disease, unspecified: Secondary | ICD-10-CM

## 2023-01-12 ENCOUNTER — Other Ambulatory Visit: Payer: Self-pay | Admitting: Nurse Practitioner

## 2023-01-12 DIAGNOSIS — J309 Allergic rhinitis, unspecified: Secondary | ICD-10-CM

## 2023-01-16 NOTE — Progress Notes (Unsigned)
3 MONTH FOLLOW UP  Assessment and Plan:  Diagnoses and all orders for this visit:  Atherosclerosis of aorta Per CT 07/2018 Control blood pressure, cholesterol, glucose, increase exercise.   Essential hypertension Continue medication: Metoprolol 25 mg every day and Furosemide 20 mg every day BP currently not controlled.  I have messaged Dr. Izora Ribas for med suggestions to control BP- he has suggested starting Spironolactone 25 mg every day and follow up in 4 weeks Monitor blood pressure at home; call if consistently over 130/80 Continue to recommend DASH diet.   Reminder to go to the ER if any CP, SOB, nausea, dizziness, severe HA, changes vision/speech, left arm numbness and tingling and jaw pain.  Atherosclerosis of native coronary artery of native heart without angina pectoris Continue to follow with Cardiology, Has appt with Dr Antoine Poche 10/03/21 Continues to smoke 1 ppd Continue plavix  Hx of CVA Recommended she quit smoking, continue plavix, statin.   Hypothyroidism, unspecified type continue medications the same reminded to take on an empty stomach 30-64mins before food.  -     TSH  Seizure disorder (HCC) Continue keppra; recommended she quit smoking but unwilling at this time. Monitor levels.  Refer back to [facility] neuro if any changes; last in 2012 per patient    Stage 4 CKD  Followed by nephrology - [facility] -     CMP/GFR - CBC  Anemia due to CKD [facility] Nephrology is managing -  Check CBC  Hyperlipidemia Continue medications LDL goal <70 Continue to recommend low cholesterol diet and exercise.  -     Lipid panel  SMOKER Continues to smoke 1/2-1 ppd- 45+ pack year history instruction/counseling given, counseled patient on the dangers of tobacco use, advised patient to stop smoking, and reviewed strategies to maximize success, Chantix gave pt nightmares. She continues to try to cut back - Discussed low dose screening CT UTD last one 10/26/21  small lung nodule same size but increased density, recommend repeat in 6 months and has been ordered  Emphysema (HCC) Per imaging, recommended stop smoking Denies sx; monitor   Vitamin D deficiency Not currently on supplementation  Abnormal Glucose Discussed disease and risks Discussed diet/exercise, weight management  -     Hemoglobin A1c  Medication management -     CBC with Differential/Platelet -     CMP/GFR   BMI 24 Long discussion about weight loss, diet, and exercise Recommended diet heavy in fruits and veggies and low in animal meats, cheeses, and dairy products, appropriate calorie intake Discussed appropriate weight for height  Follow up at next visit  HCOM(Hypertrophic Obstructive Cardiomyopathy) (HCC) Follows with cardiology Recently started on Mavacamten 5 mg QD  Peripheral vascular disease (HCC) Continue aggressive treatment of cholesterol Continue plavix Referral has been placed to vascular surgery, has evaluation tomorrow   PVD/Diminished pulses/LLE claudication Control blood pressure, cholesterol, glucose, increase exercise, on plavix  Referral has been placed to vascular surgery, has appointment tomorrow  Left foot drop - Completed PT last year - Has been practicing the techniques/exercises, wears foot brace to keep foot up  Myopathy Check with cardiology to see if this could be side effect of new medication, if not notify the office and I will place a PT referral for evaluation    Discussed med's effects and SE's. Screening labs and tests as requested with regular follow-up as recommended. Over 40 minutes of exam, counseling, chart review, and complex, high level critical decision making was performed this visit.   Future Appointments  Date Time Provider Department Center  01/30/2023 11:35 AM [facility] 5 [id number]  07/17/2023  2:00 PM Raynelle Dick, NP [id number] None     HPI  62 y.o. Caucasian married female,  -  presents for a complete physical. She has Hypothyroidism; Hyperlipidemia; SMOKER; Essential hypertension; CAD (coronary artery disease); History of CVA (cerebrovascular accident); Peripheral vascular disease (HCC); CKD (chronic kidney disease) stage 4, GFR 15-29 ml/min (HCC); Seizure disorder (HCC); Vitamin D deficiency; Poor compliance; Medication management; Overweight (BMI 25.0-29.9); Anemia in chronic kidney disease; Claudication of left lower extremity (HCC); Emphysema of lung (HCC); Aortic Atherosclerosis (HCC) by Chest CT scan in June 2020 ; Cholelithiases; Atypical nevus of right lower back; History of basal cell carcinoma (BCC); Multiple falls; Left foot drop; Acute kidney injury (HCC); Complex renal cyst; Hypotension; Hypokalemia; Hyponatremia; History of seizure; Obstructive hypertrophic cardiomyopathy (HCC); Dyslipidemia; and S/P CABG (coronary artery bypass graft) on their problem list.   Married, husband has lung cancer and is undergoing treatment. Much more stress due to his diagnosis. She is not currently on medication for mood. Currently does not want to try medication.   She continues to have left drop foot and weakness in legs bilaterally. No falls recently  Hx of ruptured cerebral aneurysm in 2005 with repeated repair in 2012, hx of seizure disorder on keppra - no recent seizures - last in 2008-  Formerly followed by [facility] Neurology, now following keppra levels here in office.   She is followed by Nephrology Dr. Annie Sable due to progression to CKD IV.  she currently continues to smoke 1 ppd ,45+ pack year history; discussed risks associated with smoking at length, patient is not ready to quit. She states she is very aware of risks and has quit in the past. She is aware that she needs to make this decision for her health. She has failed numerous attempts with Chantix. Down from 1.5-2 packs to 1 pack daily. Last CT 05/21/22:  1. Lung-RADS 2, benign appearance or behavior.  Continue annual screening with low-dose chest CT without contrast in 12 months. 2. Cholelithiasis. 3. Aortic Atherosclerosis (ICD10-I70.0) and Emphysema (ICD10-J43.9).  BMI is Body mass index is 22.64 kg/m., she has been working on diet and exercise. Weight stable Wt Readings from Last 3 Encounters:  01/17/23 142 lb 6.4 oz (64.6 kg)  12/18/22 135 lb (61.2 kg)  09/18/22 141 lb 6.4 oz (64.1 kg)   She has ASCVD with bypass in 2002, followed by Dr. Antoine Poche.  She has hx of PVD, on statin and plavix, has been reporting aching in LLE after 5 blocks.  ABI and ultrasound of femoral arteries done 09/25/21: Right:High grade stenosis vs. occlusion of the distal external iliac artery/common femoral artery. Left: Atherosclerosis in the common femoral, femoral, popliteal and tibial arteries with no focal stenosis. The right toe-brachial index is abnormal.  The left toe-brachial index is abnormal.  Atherosclerosis in the aorta and iliac arteries.  Mid aorta is ectatic.  High grade stenosis vs. occlusion in the right external iliac artery.  Vascular consult recommended  Dr  Izora Ribas visit 09/18/22 Hypertrophic Cardiomyopathy Hx of transaminitis - Gradient type: On BB and diuretics she has a severe LVOT gradient diuretics have been down titrated to as minimal as possible (PRN) - NYHA Class I - on mavacamten 2.5 mg with 12 week maintenance imaging - transaminitis has resolved; minimized GOODY powder - sister still needs screening - with PVCs; I had originally planned to do PVC screening; she is  dealing with her husbands new diagnosis; will perform this imaging in January of 2025. - low threshold to decrease metoprolol dose  Currently on Metoprolol 25 mg every day and Furosemide 20 mg every day  runs 160-170/100 at home, today their BP is BP: (!) 160/80  BP Readings from Last 3 Encounters:  01/17/23 (!) 160/80  12/18/22 (!) 178/95  09/18/22 (!) 148/76  She does workout- cycler at her desk.    She denies chest pain, shortness of breath, dizziness.    She is on cholesterol medication (rosuvastatin 40 mg daily, repatha 140 mg/ml) and denies myalgias. Her cholesterol is at goal. The cholesterol last visit was:   Lab Results  Component Value Date   CHOL 130 06/08/2022   HDL 35 (L) 06/08/2022   LDLCALC 63 06/08/2022   TRIG 189 (H) 06/08/2022   CHOLHDL 3.7 06/08/2022   She has been working on diet and exercise for glucose management, she is not on bASA (on plavix), she is on ACE/ARB and denies increased appetite, nausea, paresthesia of the feet, polydipsia, polyuria, visual disturbances and vomiting. Last A1C in the office was:  Lab Results  Component Value Date   HGBA1C 5.3 07/17/2022   She has CKD IV and is following with Dr. Annie Sable:   Lab Results  Component Value Date   EGFR 27 (L) 07/17/2022     Also anemia felt r/t CKD  Lab Results  Component Value Date   WBC 6.6 07/17/2022   HGB 13.4 07/17/2022   HCT 41.0 07/17/2022   MCV 98.3 07/17/2022   PLT 198 07/17/2022   She is on thyroid medication. Her medication was changed last visit to levothyroxine 50 mcg daily Lab Results  Component Value Date   TSH 1.46 07/17/2022   Patient is not currently on Vit D supplement due to elevation of Vit D level Lab Results  Component Value Date   VD25OH 43 07/17/2022       Current Medications:  Current Outpatient Medications on File Prior to Visit  Medication Sig Dispense Refill   clopidogrel (PLAVIX) 75 MG tablet TAKE 1 TABLET  DAILY TO  PREVENT BLOOD CLOTS                                                     /                                               TAKE                                 BY                                          MOUTH 90 tablet 3   Evolocumab (REPATHA SURECLICK) 140 MG/ML SOAJ Inject 140 mg into the skin every 14 (fourteen) days. 2 mL 11   furosemide (LASIX) 20 MG tablet TAKE 1 TABLET(20 MG) BY MOUTH DAILY AS NEEDED 90 tablet 1    levETIRAcetam (KEPPRA) 500 MG tablet TAKE 1 TABLET BY MOUTH  TWICE DAILY TO PREVENT SEIZURES 180 tablet 3   Levocetirizine Dihydrochloride (XYZAL PO) Take by mouth.     levothyroxine (SYNTHROID) 50 MCG tablet TAKE 1 TABLET(50 MCG) BY MOUTH DAILY BEFORE BREAKFAST 90 tablet 2   mavacamten (CAMZYOS) 2.5 MG CAPS capsule Take 1 capsule (2.5 mg total) by mouth daily. 35 capsule 2   metoprolol succinate (TOPROL-XL) 25 MG 24 hr tablet TAKE 1 TABLET(25 MG) BY MOUTH DAILY 90 tablet 3   montelukast (SINGULAIR) 10 MG tablet TAKE 1 TABLET(10 MG) BY MOUTH DAILY 30 tablet 2   potassium chloride (KLOR-CON M) 10 MEQ tablet TAKE 1 TABLET(10 MEQ) BY MOUTH DAILY 90 tablet 3   rosuvastatin (CRESTOR) 40 MG tablet TAKE 1 TABLET BY MOUTH DAILY FOR CHOLESTEROL 90 tablet 3   fluticasone (FLONASE) 50 MCG/ACT nasal spray Place 2 sprays into both nostrils daily. (Patient not taking: Reported on 01/17/2023) 15.8 mL 2   No current facility-administered medications on file prior to visit.   Allergies:  Allergies  Allergen Reactions   Ace Inhibitors Cough   Penicillins Other (See Comments)    "happened years ago" (reaction not recalled)   Prednisone Other (See Comments)    Dysphoria   Medical History:  She has Hypothyroidism; Hyperlipidemia; SMOKER; Essential hypertension; CAD (coronary artery disease); History of CVA (cerebrovascular accident); Peripheral vascular disease (HCC); CKD (chronic kidney disease) stage 4, GFR 15-29 ml/min (HCC); Seizure disorder (HCC); Vitamin D deficiency; Poor compliance; Medication management; Overweight (BMI 25.0-29.9); Anemia in chronic kidney disease; Claudication of left lower extremity (HCC); Emphysema of lung (HCC); Aortic Atherosclerosis (HCC) by Chest CT scan in June 2020 ; Cholelithiases; Atypical nevus of right lower back; History of basal cell carcinoma (BCC); Multiple falls; Left foot drop; Acute kidney injury (HCC); Complex renal cyst; Hypotension; Hypokalemia; Hyponatremia; History  of seizure; Obstructive hypertrophic cardiomyopathy (HCC); Dyslipidemia; and S/P CABG (coronary artery bypass graft) on their problem list. Health Maintenance:   Immunization History  Administered Date(s) Administered   Influenza Inj Mdck Quad With Preservative 01/31/2017, 12/23/2017, 01/05/2020   Influenza Split 12/02/2014   Influenza,inj,quad, With Preservative 12/20/2015   PFIZER(Purple Top)SARS-COV-2 Vaccination 07/13/2019, 08/07/2019, 04/06/2020   PPD Test 10/09/2013, 12/02/2014   Td 10/24/2006, 01/31/2017   Health Maintenance  Topic Date Due   Zoster Vaccines- Shingrix (1 of 2) Never done   Cervical Cancer Screening (HPV/Pap Cotest)  Never done   Fecal DNA (Cologuard)  Never done   INFLUENZA VACCINE  09/27/2022   MAMMOGRAM  09/28/2022   COVID-19 Vaccine (4 - 2023-24 season) 10/28/2022   Lung Cancer Screening  05/21/2023   DTaP/Tdap/Td (3 - Tdap) 02/01/2027   Hepatitis C Screening  Completed   HIV Screening  Completed   HPV VACCINES  Aged Out     Last Dental Exam: Remote - needs top teeth out - she is planning to see an oral surgeon Last Eye Exam: Annually, last visit 2020, had cataracts out, doing well    Patient Care Team: Lucky Cowboy, MD as PCP - General (Internal Medicine) Rollene Rotunda, MD as PCP - Cardiology (Cardiology) Annie Sable, MD as Consulting Physician (Nephrology)  Surgical History:  She has a past surgical history that includes Coronary artery bypass graft (2002); Embolization (2005); Cerebral aneurysm repair (Right, 2012); Cerebral aneurysm repair (Right, 2005); and Cataract extraction, bilateral (Bilateral, 2020). Family History:  Herfamily history includes Brain cancer in her father; Heart attack in her maternal grandfather; Heart attack (age of onset: 38) in her brother; Heart attack (age of onset: 62) in  her mother; Heart disease in her brother and mother; Heart disease (age of onset: 19) in her brother; Leukemia (age of onset: 92) in  her sister; Multiple sclerosis in her sister. Social History:  She reports that she has been smoking cigarettes. She started smoking about 49 years ago. She has a 49.9 pack-year smoking history. She has never used smokeless tobacco. She reports that she does not drink alcohol and does not use drugs.  Review of Systems: Review of Systems  Constitutional:  Negative for malaise/fatigue and weight loss.  HENT:  Negative for hearing loss and tinnitus.   Eyes:  Negative for blurred vision and double vision.  Respiratory:  Negative for cough, shortness of breath and wheezing.   Cardiovascular:  Positive for claudication (aching, cramping in L leg with ambulation). Negative for chest pain, palpitations, orthopnea and leg swelling.  Gastrointestinal:  Negative for abdominal pain, blood in stool, constipation, diarrhea, heartburn, melena, nausea and vomiting.  Genitourinary: Negative.   Musculoskeletal:  Negative for joint pain and myalgias.       Muscle weakness of lower extremities  Skin:  Negative for rash.  Neurological:  Negative for dizziness, tingling (soles of feet intermittently ), sensory change, weakness and headaches.  Endo/Heme/Allergies:  Positive for environmental allergies (She reports mild symptoms - uses OTC medications rarely as needed). Negative for polydipsia.  Psychiatric/Behavioral: Negative.    All other systems reviewed and are negative.   Physical Exam: Estimated body mass index is 22.64 kg/m as calculated from the following:   Height as of this encounter: 5' 6.5" (1.689 m).   Weight as of this encounter: 142 lb 6.4 oz (64.6 kg). BP (!) 160/80   Pulse (!) 51   Temp 97.9 F (36.6 C)   Ht 5' 6.5" (1.689 m)   Wt 142 lb 6.4 oz (64.6 kg)   SpO2 97%   BMI 22.64 kg/m  General Appearance: Well nourished, in no apparent distress.  Eyes: PERRLA, EOMs, conjunctiva no swelling or erythema.  Sinuses: No Frontal/maxillary tenderness  ENT/Mouth: Ext aud canals clear, normal  light reflex with TMs without erythema, bulging. Extremely poor dentition - broken/missing/black teeth. NO ulcers, glands normal to palpation. No erythema, swelling, or exudate on post pharynx. Hearing normal.  Neck: Supple, thyroid normal. No bruits  Respiratory: Respiratory effort normal, BS diminished, no wheezing or crackles Cardio: Systolic ejection murmur noted Thready symmetrical peripheral pulses without edema Chest: symmetric, with normal excursions and percussion.  Abdomen: Soft, nontender, no guarding, rebound, hernias, masses, or organomegaly.  Lymphatics: Non tender without lymphadenopathy.  Musculoskeletal: Full ROM upper extremities with 5/5 strength. Lower extremities has 4/5 strength and decreased ROM due to weakness, gait very slow and walks kicking feet to the side Skin: Warm, dry, scabbed area on right forearm Neuro: Cranial nerves intact, reflexes equal bilaterally. Normal muscle tone, no cerebellar symptoms. Sensation intact.  Psych: Awake and oriented X 3, normal affect, Insight and Judgment appropriate.       Leanore Biggers Hollie Salk 2:46 PM [facility]

## 2023-01-17 ENCOUNTER — Encounter: Payer: Self-pay | Admitting: Nurse Practitioner

## 2023-01-17 ENCOUNTER — Ambulatory Visit (INDEPENDENT_AMBULATORY_CARE_PROVIDER_SITE_OTHER): Payer: 59 | Admitting: Nurse Practitioner

## 2023-01-17 VITALS — BP 160/80 | HR 51 | Temp 97.9°F | Ht 66.5 in | Wt 142.4 lb

## 2023-01-17 DIAGNOSIS — I739 Peripheral vascular disease, unspecified: Secondary | ICD-10-CM | POA: Diagnosis not present

## 2023-01-17 DIAGNOSIS — N184 Chronic kidney disease, stage 4 (severe): Secondary | ICD-10-CM | POA: Diagnosis not present

## 2023-01-17 DIAGNOSIS — Z79899 Other long term (current) drug therapy: Secondary | ICD-10-CM

## 2023-01-17 DIAGNOSIS — E039 Hypothyroidism, unspecified: Secondary | ICD-10-CM

## 2023-01-17 DIAGNOSIS — M21372 Foot drop, left foot: Secondary | ICD-10-CM

## 2023-01-17 DIAGNOSIS — R7309 Other abnormal glucose: Secondary | ICD-10-CM

## 2023-01-17 DIAGNOSIS — I700 Atherosclerosis of aorta: Secondary | ICD-10-CM | POA: Diagnosis not present

## 2023-01-17 DIAGNOSIS — Z8673 Personal history of transient ischemic attack (TIA), and cerebral infarction without residual deficits: Secondary | ICD-10-CM

## 2023-01-17 DIAGNOSIS — G40909 Epilepsy, unspecified, not intractable, without status epilepticus: Secondary | ICD-10-CM | POA: Diagnosis not present

## 2023-01-17 DIAGNOSIS — E559 Vitamin D deficiency, unspecified: Secondary | ICD-10-CM

## 2023-01-17 DIAGNOSIS — I421 Obstructive hypertrophic cardiomyopathy: Secondary | ICD-10-CM

## 2023-01-17 DIAGNOSIS — E782 Mixed hyperlipidemia: Secondary | ICD-10-CM

## 2023-01-17 DIAGNOSIS — D631 Anemia in chronic kidney disease: Secondary | ICD-10-CM

## 2023-01-17 DIAGNOSIS — F17200 Nicotine dependence, unspecified, uncomplicated: Secondary | ICD-10-CM

## 2023-01-17 DIAGNOSIS — I10 Essential (primary) hypertension: Secondary | ICD-10-CM

## 2023-01-17 DIAGNOSIS — J432 Centrilobular emphysema: Secondary | ICD-10-CM

## 2023-01-17 DIAGNOSIS — I2510 Atherosclerotic heart disease of native coronary artery without angina pectoris: Secondary | ICD-10-CM

## 2023-01-17 MED ORDER — SPIRONOLACTONE 25 MG PO TABS: 25.0000 mg | ORAL_TABLET | Freq: Every day | ORAL | 6 refills | Status: DC

## 2023-01-17 NOTE — Patient Instructions (Signed)
Hypertension, Adult High blood pressure (hypertension) is when the force of blood pumping through the arteries is too strong. The arteries are the blood vessels that carry blood from the heart throughout the body. Hypertension forces the heart to work harder to pump blood and may cause arteries to become narrow or stiff. Untreated or uncontrolled hypertension can lead to a heart attack, heart failure, a stroke, kidney disease, and other problems. A blood pressure reading consists of a higher number over a lower number. Ideally, your blood pressure should be below 120/80. The first ("top") number is called the systolic pressure. It is a measure of the pressure in your arteries as your heart beats. The second ("bottom") number is called the diastolic pressure. It is a measure of the pressure in your arteries as the heart relaxes. What are the causes? The exact cause of this condition is not known. There are some conditions that result in high blood pressure. What increases the risk? Certain factors may make you more likely to develop high blood pressure. Some of these risk factors are under your control, including: Smoking. Not getting enough exercise or physical activity. Being overweight. Having too much fat, sugar, calories, or salt (sodium) in your diet. Drinking too much alcohol. Other risk factors include: Having a personal history of heart disease, diabetes, high cholesterol, or kidney disease. Stress. Having a family history of high blood pressure and high cholesterol. Having obstructive sleep apnea. Age. The risk increases with age. What are the signs or symptoms? High blood pressure may not cause symptoms. Very high blood pressure (hypertensive crisis) may cause: Headache. Fast or irregular heartbeats (palpitations). Shortness of breath. Nosebleed. Nausea and vomiting. Vision changes. Severe chest pain, dizziness, and seizures. How is this diagnosed? This condition is diagnosed by  measuring your blood pressure while you are seated, with your arm resting on a flat surface, your legs uncrossed, and your feet flat on the floor. The cuff of the blood pressure monitor will be placed directly against the skin of your upper arm at the level of your heart. Blood pressure should be measured at least twice using the same arm. Certain conditions can cause a difference in blood pressure between your right and left arms. If you have a high blood pressure reading during one visit or you have normal blood pressure with other risk factors, you may be asked to: Return on a different day to have your blood pressure checked again. Monitor your blood pressure at home for 1 week or longer. If you are diagnosed with hypertension, you may have other blood or imaging tests to help your health care provider understand your overall risk for other conditions. How is this treated? This condition is treated by making healthy lifestyle changes, such as eating healthy foods, exercising more, and reducing your alcohol intake. You may be referred for counseling on a healthy diet and physical activity. Your health care provider may prescribe medicine if lifestyle changes are not enough to get your blood pressure under control and if: Your systolic blood pressure is above 130. Your diastolic blood pressure is above 80. Your personal target blood pressure may vary depending on your medical conditions, your age, and other factors. Follow these instructions at home: Eating and drinking  Eat a diet that is high in fiber and potassium, and low in sodium, added sugar, and fat. An example of this eating plan is called the DASH diet. DASH stands for Dietary Approaches to Stop Hypertension. To eat this way: Eat   plenty of fresh fruits and vegetables. Try to fill one half of your plate at each meal with fruits and vegetables. Eat whole grains, such as whole-wheat pasta, brown rice, or whole-grain bread. Fill about one  fourth of your plate with whole grains. Eat or drink low-fat dairy products, such as skim milk or low-fat yogurt. Avoid fatty cuts of meat, processed or cured meats, and poultry with skin. Fill about one fourth of your plate with lean proteins, such as fish, chicken without skin, beans, eggs, or tofu. Avoid pre-made and processed foods. These tend to be higher in sodium, added sugar, and fat. Reduce your daily sodium intake. Many people with hypertension should eat less than 1,500 mg of sodium a day. Do not drink alcohol if: Your health care provider tells you not to drink. You are pregnant, may be pregnant, or are planning to become pregnant. If you drink alcohol: Limit how much you have to: 0-1 drink a day for women. 0-2 drinks a day for men. Know how much alcohol is in your drink. In the [place] one drink equals one 12 oz bottle of beer (355 mL), one 5 oz glass of wine (148 mL), or one 1 oz glass of hard liquor (44 mL). Lifestyle  Work with your health care provider to maintain a healthy body weight or to lose weight. Ask what an ideal weight is for you. Get at least 30 minutes of exercise that causes your heart to beat faster (aerobic exercise) most days of the week. Activities may include walking, swimming, or biking. Include exercise to strengthen your muscles (resistance exercise), such as Pilates or lifting weights, as part of your weekly exercise routine. Try to do these types of exercises for 30 minutes at least 3 days a week. Do not use any products that contain nicotine or tobacco. These products include cigarettes, chewing tobacco, and vaping devices, such as e-cigarettes. If you need help quitting, ask your health care provider. Monitor your blood pressure at home as told by your health care provider. Keep all follow-up visits. This is important. Medicines Take over-the-counter and prescription medicines only as told by your health care provider. Follow directions carefully. Blood  pressure medicines must be taken as prescribed. Do not skip doses of blood pressure medicine. Doing this puts you at risk for problems and can make the medicine less effective. Ask your health care provider about side effects or reactions to medicines that you should watch for. Contact a health care provider if you: Think you are having a reaction to a medicine you are taking. Have headaches that keep coming back (recurring). Feel dizzy. Have swelling in your ankles. Have trouble with your vision. Get help right away if you: Develop a severe headache or confusion. Have unusual weakness or numbness. Feel faint. Have severe pain in your chest or abdomen. Vomit repeatedly. Have trouble breathing. These symptoms may be an emergency. Get help right away. Call 911. Do not wait to see if the symptoms will go away. Do not drive yourself to the hospital. Summary Hypertension is when the force of blood pumping through your arteries is too strong. If this condition is not controlled, it may put you at risk for serious complications. Your personal target blood pressure may vary depending on your medical conditions, your age, and other factors. For most people, a normal blood pressure is less than 120/80. Hypertension is treated with lifestyle changes, medicines, or a combination of both. Lifestyle changes include losing weight, eating a healthy,   low-sodium diet, exercising more, and limiting alcohol. This information is not intended to replace advice given to you by your health care provider. Make sure you discuss any questions you have with your health care provider. Document Revised: 12/20/2020 Document Reviewed: 12/20/2020 Elsevier Patient Education  2024 [employer]  

## 2023-01-18 LAB — CBC WITH DIFFERENTIAL/PLATELET
Absolute Lymphocytes: 2115 {cells}/uL (ref 850–3900)
Absolute Monocytes: 632 {cells}/uL (ref 200–950)
Basophils Absolute: 48 {cells}/uL (ref 0–200)
Basophils Relative: 0.7 %
Eosinophils Absolute: 258 {cells}/uL (ref 15–500)
Eosinophils Relative: 3.8 %
HCT: 40.2 % (ref 35.0–45.0)
Hemoglobin: 13.4 g/dL (ref 11.7–15.5)
MCH: 32.9 pg (ref 27.0–33.0)
MCHC: 33.3 g/dL (ref 32.0–36.0)
MCV: 98.8 fL (ref 80.0–100.0)
MPV: 10.5 fL (ref 7.5–12.5)
Monocytes Relative: 9.3 %
Neutro Abs: 3747 {cells}/uL (ref 1500–7800)
Neutrophils Relative %: 55.1 %
Platelets: 193 10*3/uL (ref 140–400)
RBC: 4.07 10*6/uL (ref 3.80–5.10)
RDW: 12.3 % (ref 11.0–15.0)
Total Lymphocyte: 31.1 %
WBC: 6.8 10*3/uL (ref 3.8–10.8)

## 2023-01-18 LAB — COMPLETE METABOLIC PANEL WITH GFR
AG Ratio: 1.9 (calc) (ref 1.0–2.5)
ALT: 4 U/L — ABNORMAL LOW (ref 6–29)
AST: 13 U/L (ref 10–35)
Albumin: 4 g/dL (ref 3.6–5.1)
Alkaline phosphatase (APISO): 64 U/L (ref 37–153)
BUN/Creatinine Ratio: 17 (calc) (ref 6–22)
BUN: 36 mg/dL — ABNORMAL HIGH (ref 7–25)
CO2: 29 mmol/L (ref 20–32)
Calcium: 9.6 mg/dL (ref 8.6–10.4)
Chloride: 101 mmol/L (ref 98–110)
Creat: 2.13 mg/dL — ABNORMAL HIGH (ref 0.50–1.05)
Globulin: 2.1 g/dL (ref 1.9–3.7)
Glucose, Bld: 81 mg/dL (ref 65–99)
Potassium: 4.3 mmol/L (ref 3.5–5.3)
Sodium: 140 mmol/L (ref 135–146)
Total Bilirubin: 0.4 mg/dL (ref 0.2–1.2)
Total Protein: 6.1 g/dL (ref 6.1–8.1)
eGFR: 25 mL/min/{1.73_m2} — ABNORMAL LOW (ref >=60)

## 2023-01-18 LAB — LIPID PANEL
Cholesterol: 307 mg/dL — ABNORMAL HIGH (ref <200)
HDL: 33 mg/dL — ABNORMAL LOW (ref >=50)
LDL Cholesterol (Calc): 224 mg/dL — ABNORMAL HIGH
Non-HDL Cholesterol (Calc): 274 mg/dL — ABNORMAL HIGH (ref <130)
Total CHOL/HDL Ratio: 9.3 (calc) — ABNORMAL HIGH (ref <5.0)
Triglycerides: 279 mg/dL — ABNORMAL HIGH (ref <150)

## 2023-01-18 LAB — TSH: TSH: 1.04 m[IU]/L (ref 0.40–4.50)

## 2023-01-18 LAB — IRON,TIBC AND FERRITIN PANEL
%SAT: 31 % (ref 16–45)
Ferritin: 42 ng/mL (ref 16–288)
Iron: 81 ug/dL (ref 45–160)
TIBC: 265 ug/dL (ref 250–450)

## 2023-01-30 ENCOUNTER — Ambulatory Visit (HOSPITAL_COMMUNITY): Payer: 59 | Attending: Internal Medicine

## 2023-01-30 DIAGNOSIS — I421 Obstructive hypertrophic cardiomyopathy: Secondary | ICD-10-CM | POA: Diagnosis present

## 2023-01-30 LAB — ECHOCARDIOGRAM LIMITED
Area-P 1/2: 2.3 cm2
S' Lateral: 2.4 cm

## 2023-02-04 ENCOUNTER — Telehealth: Payer: Self-pay

## 2023-02-04 DIAGNOSIS — I421 Obstructive hypertrophic cardiomyopathy: Secondary | ICD-10-CM

## 2023-02-04 MED ORDER — MAVACAMTEN 2.5 MG PO CAPS: 2.5000 mg | ORAL_CAPSULE | Freq: Every day | ORAL | 2 refills | Status: DC

## 2023-02-04 NOTE — Telephone Encounter (Signed)
The patient has been notified of the result and verbalized understanding.  All questions (if any) were answered. Macie Burows, RN 02/04/2023 5:27 PM    Pt reports has fatigue but is unable to tell if its r/t husbands health.  Doesn't want to do med adjustment at this time.   PSF updated on [facility] portal.  Refill of Camzyos 2.5 mg PO every day placed.

## 2023-02-04 NOTE — Telephone Encounter (Signed)
-----   Message from Christell Constant sent at 01/31/2023  8:17 AM EST ----- Results: Peak LVOT Gradient: 5 mm Hg LVEF : 57% Plan: Mavacamten dose 2.5  - if fatigue still present, cut metoprolol dose to 12.5 mg PO Daily - If Patient has new medications or supplements, will need medication review for potential interactions - otherwise 12 week imaging and continue current dose   Christell Constant, MD

## 2023-02-10 ENCOUNTER — Other Ambulatory Visit: Payer: Self-pay | Admitting: Internal Medicine

## 2023-02-14 ENCOUNTER — Ambulatory Visit: Payer: 59 | Admitting: Nurse Practitioner

## 2023-03-01 ENCOUNTER — Ambulatory Visit: Payer: 59 | Admitting: Nurse Practitioner

## 2023-03-11 NOTE — Progress Notes (Signed)
 Assessment and Plan:    Essential hypertension Continue medication: Metoprolol  25 mg every day, spironolactone  25 mg every day - currently well controlled Monitor blood pressure at home; call if consistently over 130/80 Continue to recommend DASH diet.   Reminder to go to the ER if any CP, SOB, nausea, dizziness, severe HA, changes vision/speech, left arm numbness and tingling and jaw pain.   CKD (chronic kidney disease) stage 4, GFR 15-29 ml/min (HCC) Increase fluids, avoid NSAIDS, monitor sugars, will monitor  -     CBC with Differential/Platelet -     BASIC METABOLIC PANEL WITH GFR  Medication management -     CBC with Differential/Platelet -     BASIC METABOLIC PANEL WITH GFR   Further disposition pending results of labs. Discussed med's effects and SE's.   Over 30 minutes of exam, counseling, chart review, and critical decision making was performed.   Future Appointments  Date Time Provider Department Center  04/25/2023 10:35 AM [id number]  07/17/2023  2:00 PM Jude Lonell BRAVO, NP [facility] None    ------------------------------------------------------------------------------------------------------------------   HPI BP 128/70   Pulse (!) 55   Temp (!) 97.3 F (36.3 C)   Ht 5' 6.5 (1.689 m)   Wt 135 lb (61.2 kg)   SpO2 98%   BMI 21.46 kg/m  63 y.o.female presents for  BP well controlled with Metoprolol  25 mg every day and spironolactone  25 mg every day  BP Readings from Last 3 Encounters:  03/12/23 128/70  01/17/23 (!) 160/80  12/18/22 (!) 178/95  Denies headaches, chest pain, shortness of breath and dizziness   BMI is Body mass index is 21.46 kg/m., she has not been working on diet and exercise. Wt Readings from Last 3 Encounters:  03/12/23 135 lb (61.2 kg)  01/17/23 142 lb 6.4 oz (64.6 kg)  12/18/22 135 lb (61.2 kg)   Lab Results  Component Value Date   NA 140 01/17/2023   CL 101 01/17/2023   K 4.3 01/17/2023    CO2 29 01/17/2023   BUN 36 (H) 01/17/2023   CREATININE 2.13 (H) 01/17/2023   EGFR 25 (L) 01/17/2023   CALCIUM  9.6 01/17/2023   PHOS 3.8 02/01/2021   ALBUMIN 3.8 (L) 06/08/2022   GLUCOSE 81 01/17/2023     Past Medical History:  Diagnosis Date   Aneurysm (HCC)    CAD (coronary artery disease) 2002   hx CABG 2002, Cath 2011 w/ occluded native vessels but LIMA-LAD, SVG-pLAD, SVG-Diag, SVG-OM patent, SVG-RCA occluded   Cholelithiasis    CKD (chronic kidney disease) stage 3, GFR 30-59 ml/min (HCC)    History of basal cell carcinoma excision 12/20/2017   Hyperlipidemia LDL goal <70    Hypertension    Hypothyroidism    PAD (peripheral artery disease) (HCC)    Renal cyst    Tobacco abuse    Vitamin D  deficiency      Allergies  Allergen Reactions   Ace Inhibitors Cough   Penicillins Other (See Comments)    happened years ago (reaction not recalled)   Prednisone  Other (See Comments)    Dysphoria    Current Outpatient Medications on File Prior to Visit  Medication Sig   clopidogrel  (PLAVIX ) 75 MG tablet TAKE 1 TABLET  DAILY TO  PREVENT BLOOD CLOTS                                                     /  TAKE                                 BY                                          MOUTH   Evolocumab  (REPATHA  SURECLICK) 140 MG/ML SOAJ Inject 140 mg into the skin every 14 (fourteen) days.   fluticasone  (FLONASE ) 50 MCG/ACT nasal spray Place 2 sprays into both nostrils daily.   furosemide  (LASIX ) 20 MG tablet TAKE 1 TABLET(20 MG) BY MOUTH DAILY AS NEEDED   levETIRAcetam  (KEPPRA ) 500 MG tablet TAKE 1 TABLET BY MOUTH TWICE DAILY TO PREVENT SEIZURES   Levocetirizine Dihydrochloride (XYZAL PO) Take by mouth.   levothyroxine  (SYNTHROID ) 50 MCG tablet TAKE 1 TABLET(50 MCG) BY MOUTH DAILY BEFORE BREAKFAST   mavacamten  (CAMZYOS ) 2.5 MG CAPS capsule Take 1 capsule (2.5 mg total) by mouth daily.   metoprolol  succinate (TOPROL -XL) 25 MG 24 hr tablet  TAKE 1 TABLET(25 MG) BY MOUTH DAILY   potassium chloride  (KLOR-CON  M) 10 MEQ tablet TAKE 1 TABLET(10 MEQ) BY MOUTH DAILY   rosuvastatin  (CRESTOR ) 40 MG tablet TAKE 1 TABLET BY MOUTH DAILY FOR CHOLESTEROL   spironolactone  (ALDACTONE ) 25 MG tablet Take 1 tablet (25 mg total) by mouth daily.   montelukast  (SINGULAIR ) 10 MG tablet TAKE 1 TABLET(10 MG) BY MOUTH DAILY (Patient not taking: Reported on 03/12/2023)   No current facility-administered medications on file prior to visit.    ROS: all negative except above.   Physical Exam:  BP 128/70   Pulse (!) 55   Temp (!) 97.3 F (36.3 C)   Ht 5' 6.5 (1.689 m)   Wt 135 lb (61.2 kg)   SpO2 98%   BMI 21.46 kg/m   General Appearance: Well nourished, in no apparent distress. Eyes: PERRLA, EOMs, conjunctiva no swelling or erythema Neck: Supple, thyroid normal.  Respiratory: Respiratory effort normal, BS equal bilaterally without rales, rhonchi, wheezing or stridor.  Cardio: RRR with no MRGs. Brisk peripheral pulses without edema.  Abdomen: Soft, + BS.  Non tender, no guarding, rebound, hernias, masses. Musculoskeletal: Full ROM, 5/5 strength, normal gait.  Skin: Warm, dry without rashes, lesions, ecchymosis.  Neuro: Cranial nerves intact. Normal muscle tone, no cerebellar symptoms. Sensation intact.  Psych: Awake and oriented X 3, normal affect, Insight and Judgment appropriate.     Harvest Deist E Marcellous Snarski, NP 4:12 PM [facility]

## 2023-03-12 ENCOUNTER — Ambulatory Visit (INDEPENDENT_AMBULATORY_CARE_PROVIDER_SITE_OTHER): Payer: 59 | Admitting: Nurse Practitioner

## 2023-03-12 ENCOUNTER — Encounter: Payer: Self-pay | Admitting: Nurse Practitioner

## 2023-03-12 VITALS — BP 128/70 | HR 55 | Temp 97.3°F | Ht 66.5 in | Wt 135.0 lb

## 2023-03-12 DIAGNOSIS — Z79899 Other long term (current) drug therapy: Secondary | ICD-10-CM

## 2023-03-12 DIAGNOSIS — I10 Essential (primary) hypertension: Secondary | ICD-10-CM

## 2023-03-12 DIAGNOSIS — N184 Chronic kidney disease, stage 4 (severe): Secondary | ICD-10-CM

## 2023-03-12 NOTE — Patient Instructions (Signed)
 Hypertension, Adult High blood pressure (hypertension) is when the force of blood pumping through the arteries is too strong. The arteries are the blood vessels that carry blood from the heart throughout the body. Hypertension forces the heart to work harder to pump blood and may cause arteries to become narrow or stiff. Untreated or uncontrolled hypertension can lead to a heart attack, heart failure, a stroke, kidney disease, and other problems. A blood pressure reading consists of a higher number over a lower number. Ideally, your blood pressure should be below 120/80. The first ("top") number is called the systolic pressure. It is a measure of the pressure in your arteries as your heart beats. The second ("bottom") number is called the diastolic pressure. It is a measure of the pressure in your arteries as the heart relaxes. What are the causes? The exact cause of this condition is not known. There are some conditions that result in high blood pressure. What increases the risk? Certain factors may make you more likely to develop high blood pressure. Some of these risk factors are under your control, including: Smoking. Not getting enough exercise or physical activity. Being overweight. Having too much fat, sugar, calories, or salt (sodium) in your diet. Drinking too much alcohol. Other risk factors include: Having a personal history of heart disease, diabetes, high cholesterol, or kidney disease. Stress. Having a family history of high blood pressure and high cholesterol. Having obstructive sleep apnea. Age. The risk increases with age. What are the signs or symptoms? High blood pressure may not cause symptoms. Very high blood pressure (hypertensive crisis) may cause: Headache. Fast or irregular heartbeats (palpitations). Shortness of breath. Nosebleed. Nausea and vomiting. Vision changes. Severe chest pain, dizziness, and seizures. How is this diagnosed? This condition is diagnosed by  measuring your blood pressure while you are seated, with your arm resting on a flat surface, your legs uncrossed, and your feet flat on the floor. The cuff of the blood pressure monitor will be placed directly against the skin of your upper arm at the level of your heart. Blood pressure should be measured at least twice using the same arm. Certain conditions can cause a difference in blood pressure between your right and left arms. If you have a high blood pressure reading during one visit or you have normal blood pressure with other risk factors, you may be asked to: Return on a different day to have your blood pressure checked again. Monitor your blood pressure at home for 1 week or longer. If you are diagnosed with hypertension, you may have other blood or imaging tests to help your health care provider understand your overall risk for other conditions. How is this treated? This condition is treated by making healthy lifestyle changes, such as eating healthy foods, exercising more, and reducing your alcohol intake. You may be referred for counseling on a healthy diet and physical activity. Your health care provider may prescribe medicine if lifestyle changes are not enough to get your blood pressure under control and if: Your systolic blood pressure is above 130. Your diastolic blood pressure is above 80. Your personal target blood pressure may vary depending on your medical conditions, your age, and other factors. Follow these instructions at home: Eating and drinking  Eat a diet that is high in fiber and potassium, and low in sodium, added sugar, and fat. An example of this eating plan is called the DASH diet. DASH stands for Dietary Approaches to Stop Hypertension. To eat this way: Eat  plenty of fresh fruits and vegetables. Try to fill one half of your plate at each meal with fruits and vegetables. Eat whole grains, such as whole-wheat pasta, brown rice, or whole-grain bread. Fill about one  fourth of your plate with whole grains. Eat or drink low-fat dairy products, such as skim milk or low-fat yogurt. Avoid fatty cuts of meat, processed or cured meats, and poultry with skin. Fill about one fourth of your plate with lean proteins, such as fish, chicken without skin, beans, eggs, or tofu. Avoid pre-made and processed foods. These tend to be higher in sodium, added sugar, and fat. Reduce your daily sodium intake. Many people with hypertension should eat less than 1,500 mg of sodium a day. Do not drink alcohol if: Your health care provider tells you not to drink. You are pregnant, may be pregnant, or are planning to become pregnant. If you drink alcohol: Limit how much you have to: 0-1 drink a day for women. 0-2 drinks a day for men. Know how much alcohol is in your drink. In the [place] one drink equals one 12 oz bottle of beer (355 mL), one 5 oz glass of wine (148 mL), or one 1 oz glass of hard liquor (44 mL). Lifestyle  Work with your health care provider to maintain a healthy body weight or to lose weight. Ask what an ideal weight is for you. Get at least 30 minutes of exercise that causes your heart to beat faster (aerobic exercise) most days of the week. Activities may include walking, swimming, or biking. Include exercise to strengthen your muscles (resistance exercise), such as Pilates or lifting weights, as part of your weekly exercise routine. Try to do these types of exercises for 30 minutes at least 3 days a week. Do not use any products that contain nicotine or tobacco. These products include cigarettes, chewing tobacco, and vaping devices, such as e-cigarettes. If you need help quitting, ask your health care provider. Monitor your blood pressure at home as told by your health care provider. Keep all follow-up visits. This is important. Medicines Take over-the-counter and prescription medicines only as told by your health care provider. Follow directions carefully. Blood  pressure medicines must be taken as prescribed. Do not skip doses of blood pressure medicine. Doing this puts you at risk for problems and can make the medicine less effective. Ask your health care provider about side effects or reactions to medicines that you should watch for. Contact a health care provider if you: Think you are having a reaction to a medicine you are taking. Have headaches that keep coming back (recurring). Feel dizzy. Have swelling in your ankles. Have trouble with your vision. Get help right away if you: Develop a severe headache or confusion. Have unusual weakness or numbness. Feel faint. Have severe pain in your chest or abdomen. Vomit repeatedly. Have trouble breathing. These symptoms may be an emergency. Get help right away. Call 911. Do not wait to see if the symptoms will go away. Do not drive yourself to the hospital. Summary Hypertension is when the force of blood pumping through your arteries is too strong. If this condition is not controlled, it may put you at risk for serious complications. Your personal target blood pressure may vary depending on your medical conditions, your age, and other factors. For most people, a normal blood pressure is less than 120/80. Hypertension is treated with lifestyle changes, medicines, or a combination of both. Lifestyle changes include losing weight, eating a healthy,  low-sodium diet, exercising more, and limiting alcohol. This information is not intended to replace advice given to you by your health care provider. Make sure you discuss any questions you have with your health care provider. Document Revised: 12/20/2020 Document Reviewed: 12/20/2020 Elsevier Patient Education  2024 [employer].

## 2023-03-13 LAB — CBC WITH DIFFERENTIAL/PLATELET
Absolute Lymphocytes: 2088 {cells}/uL (ref 850–3900)
Absolute Monocytes: 639 {cells}/uL (ref 200–950)
Basophils Absolute: 68 {cells}/uL (ref 0–200)
Basophils Relative: 1 %
Eosinophils Absolute: 156 {cells}/uL (ref 15–500)
Eosinophils Relative: 2.3 %
HCT: 40.3 % (ref 35.0–45.0)
Hemoglobin: 13.4 g/dL (ref 11.7–15.5)
MCH: 33.1 pg — ABNORMAL HIGH (ref 27.0–33.0)
MCHC: 33.3 g/dL (ref 32.0–36.0)
MCV: 99.5 fL (ref 80.0–100.0)
MPV: 10.3 fL (ref 7.5–12.5)
Monocytes Relative: 9.4 %
Neutro Abs: 3849 {cells}/uL (ref 1500–7800)
Neutrophils Relative %: 56.6 %
Platelets: 187 10*3/uL (ref 140–400)
RBC: 4.05 10*6/uL (ref 3.80–5.10)
RDW: 12.6 % (ref 11.0–15.0)
Total Lymphocyte: 30.7 %
WBC: 6.8 10*3/uL (ref 3.8–10.8)

## 2023-03-13 LAB — BASIC METABOLIC PANEL WITH GFR
BUN/Creatinine Ratio: 12 (calc) (ref 6–22)
BUN: 31 mg/dL — ABNORMAL HIGH (ref 7–25)
CO2: 28 mmol/L (ref 20–32)
Calcium: 9.2 mg/dL (ref 8.6–10.4)
Chloride: 103 mmol/L (ref 98–110)
Creat: 2.59 mg/dL — ABNORMAL HIGH (ref 0.50–1.05)
Glucose, Bld: 88 mg/dL (ref 65–99)
Potassium: 5.1 mmol/L (ref 3.5–5.3)
Sodium: 141 mmol/L (ref 135–146)
eGFR: 20 mL/min/{1.73_m2} — ABNORMAL LOW (ref >=60)

## 2023-04-01 ENCOUNTER — Telehealth: Payer: Self-pay | Admitting: Nurse Practitioner

## 2023-04-01 DIAGNOSIS — J309 Allergic rhinitis, unspecified: Secondary | ICD-10-CM

## 2023-04-01 MED ORDER — MONTELUKAST SODIUM 10 MG PO TABS: 10.0000 mg | ORAL_TABLET | Freq: Every day | ORAL | 3 refills | Status: DC

## 2023-04-01 NOTE — Addendum Note (Signed)
Addended by: Dionicio Stall on: 04/01/2023 02:13 PM   Modules accepted: Orders

## 2023-04-01 NOTE — Telephone Encounter (Signed)
Patient is requesting a 90-day supply of Montelukast in order for insurance to cover medication.  [facility] [place], [place] - [address]

## 2023-04-03 ENCOUNTER — Telehealth: Payer: Self-pay | Admitting: Internal Medicine

## 2023-04-03 NOTE — Telephone Encounter (Signed)
 Pt c/o medication issue:  1. Name of Medication:   mavacamten  (CAMZYOS ) 2.5 MG CAPS capsule    2. How are you currently taking this medication (dosage and times per day)?    3. Are you having a reaction (difficulty breathing--STAT)? no  4. What is your medication issue? Calling to see why the patient started taking this medication again. Please advise

## 2023-04-04 NOTE — Telephone Encounter (Signed)
 Bufford Carne, pharmacist, called back in about this medication. She states pt has taken since November and she asked if this was purposeful and the reason why.

## 2023-04-04 NOTE — Telephone Encounter (Signed)
 Called [facility] pharmacy was advised that mavacamten  2.5 mg has not dispensed to pt since 12/31/22.  Pharmacy would like to know how to proceed.  Advised I will speak with MD and give a call back.  Called pt reports husband was dx with Stage 4 lung cancer and she has not taken med as ordered.  Missing a lot of doses d/t being overwhelmed.   I asked pt if she would like to wait until things are a little more settled before reordering med.  Pt reports would like to continue on medication.  Next Echo due 04/25/23.  Will send to MD to advise.

## 2023-04-08 NOTE — Telephone Encounter (Signed)
  Phon calling back to follow up. She said, when calling them back just request to speak with any pharmacy and they can get the recommendations

## 2023-04-09 NOTE — Telephone Encounter (Signed)
Called pt advised of MD recommendation:Clarification- she has not received therapy since 12/2022?  I believe that would constitute a restart.  WE can get the echo and she how where she is.  Pt reports she has taken med but will miss days occasionally.  Reports has 10-15 pills left and will continue to take them.  Advised pt per [facility] protocol will have to restart; have echo on 2/27 and go from there.  Pt insist on taking pills she has left.  Will send to MD as an FYI.

## 2023-04-09 NOTE — Telephone Encounter (Signed)
Caller Archie Patten) called to follow-up on patient's mavacamten (CAMZYOS) 2.5 MG CAPS capsule medication.

## 2023-04-23 ENCOUNTER — Other Ambulatory Visit: Payer: Self-pay

## 2023-04-23 MED ORDER — CLOPIDOGREL BISULFATE 75 MG PO TABS: ORAL_TABLET | ORAL | 0 refills | Status: DC

## 2023-04-25 ENCOUNTER — Ambulatory Visit (HOSPITAL_COMMUNITY): Payer: 59 | Attending: Cardiovascular Disease

## 2023-04-25 DIAGNOSIS — I421 Obstructive hypertrophic cardiomyopathy: Secondary | ICD-10-CM | POA: Diagnosis present

## 2023-04-25 LAB — ECHOCARDIOGRAM LIMITED
Area-P 1/2: 1.68 cm2
MV M vel: 5.9 m/s
MV Peak grad: 139.2 mmHg
S' Lateral: 2.5 cm

## 2023-04-29 ENCOUNTER — Telehealth: Payer: Self-pay | Admitting: Internal Medicine

## 2023-04-29 DIAGNOSIS — I421 Obstructive hypertrophic cardiomyopathy: Secondary | ICD-10-CM

## 2023-04-29 NOTE — Telephone Encounter (Signed)
 Patient notes that she is doing well.   Since last visit notes no symptoms . There are no interval hospital/ED visit.   No new medications.  No chest pain or pressure .  No SOB/DOE and no PND/Orthopnea.  No weight gain or leg swelling.  No palpitations or syncope .  LVOT gradient 12 mm Hg LVEF 70%  Patient is taking 2.5 mg Mavacmaten sporadically, this is likely why gradients have increases slightly.  Return to 2.5 dose and Twelve week echo  Riley Lam, MD FASE [facility] Cardiologist [facility]  [address] [place], [address] [place], [place] [address] [phone]  5:25 PM

## 2023-04-30 MED ORDER — MAVACAMTEN 2.5 MG PO CAPS: 2.5000 mg | ORAL_CAPSULE | Freq: Every day | ORAL | 2 refills | Status: DC

## 2023-04-30 NOTE — Telephone Encounter (Signed)
 PSF updated on Camzyos REMS portal.  Refilled camzyos 2.5 mg PO every day sent to specialty pharmacy on file.

## 2023-04-30 NOTE — Addendum Note (Signed)
 Addended by: Macie Burows on: 04/30/2023 08:43 AM   Modules accepted: Orders

## 2023-05-01 ENCOUNTER — Telehealth: Payer: Self-pay | Admitting: Internal Medicine

## 2023-05-01 DIAGNOSIS — I421 Obstructive hypertrophic cardiomyopathy: Secondary | ICD-10-CM

## 2023-05-01 MED ORDER — MAVACAMTEN 5 MG PO CAPS: 5.0000 mg | ORAL_CAPSULE | Freq: Every day | ORAL | 0 refills | Status: DC

## 2023-05-01 NOTE — Telephone Encounter (Signed)
 Pt c/o medication issue:  1. Name of Medication: mavacamten (CAMZYOS) 2.5 MG CAPS capsule   2. How are you currently taking this medication (dosage and times per day)? Take 1 capsule (2.5 mg total) by mouth daily.   3. Are you having a reaction (difficulty breathing--STAT)? No   4. What is your medication issue? Patient been out of medication for 12 weeks with no reason as to why patient stopped medication, so the medication mavacamten (CAMZYOS) 2.5 MG CAPS capsule should restart to 5 MG instead of 2.5 MG. If refill to 2.5 MG it would be considered false labeling [phone] [facility]

## 2023-05-01 NOTE — Telephone Encounter (Signed)
 Called pt advised of protocol to restart camzyos at 5 mg PO every day.  Pt will need f/u echo in 4 weeks.  This is d/t camzyos not being dispensed since Nov.  Pt thanked me for call.   Ordered Camzyos 5 mg PO every day sent to specialty pharmacy on file.

## 2023-05-03 NOTE — Telephone Encounter (Signed)
 Called [facility] REMS portal advised of pharmacy stating pt would have to be considered a restart since has not had a dispense since Nov.  Advised that MD reviewed echo and recommended pt continue 2.5 mg.  Would like direction as PSF states MD recommendation to continue 2.5 mg PO every day.  REMS advisor to call back with recommendation.  Was told that an adverse event will be started d/t this.  Advised there was no adverse event as MD has reviewed echo and did not see anything of concern.  Was advised per protocol adverse event would need to be started.

## 2023-05-06 NOTE — Telephone Encounter (Signed)
 Called [facility] to check on the status of pt medication.  Was advised the pharmacy will reach out to pt regarding copay and will set up delivery of medication.

## 2023-05-13 NOTE — Addendum Note (Signed)
 Addended by: Riley Lam A on: 05/13/2023 04:49 PM   Modules accepted: Orders

## 2023-05-30 ENCOUNTER — Telehealth: Payer: Self-pay | Admitting: Pharmacy Technician

## 2023-05-30 ENCOUNTER — Other Ambulatory Visit (HOSPITAL_COMMUNITY): Payer: Self-pay

## 2023-05-30 NOTE — Telephone Encounter (Signed)
 Pharmacy Patient Advocate Encounter   Received notification from CoverMyMeds that prior authorization for camzyos is required/requested.   Insurance verification completed.   The patient is insured through [facility] ADVANTAGE/RX ADVANCE .   Per test claim: PA required; PA submitted to above mentioned insurance via CoverMyMeds Key/confirmation #/[id number] Status is pending

## 2023-05-31 NOTE — Telephone Encounter (Signed)
 Pharmacy Patient Advocate Encounter  Received notification from [facility] ADVANTAGE/RX ADVANCE that Prior Authorization for camzyos has been APPROVED from 05/31/23 to 02/26/24

## 2023-06-02 ENCOUNTER — Other Ambulatory Visit: Payer: Self-pay | Admitting: Internal Medicine

## 2023-06-05 ENCOUNTER — Ambulatory Visit (HOSPITAL_COMMUNITY): Attending: Cardiovascular Disease

## 2023-06-05 DIAGNOSIS — I421 Obstructive hypertrophic cardiomyopathy: Secondary | ICD-10-CM | POA: Diagnosis present

## 2023-06-05 LAB — ECHOCARDIOGRAM LIMITED
Area-P 1/2: 2.99 cm2
S' Lateral: 2.7 cm

## 2023-06-06 ENCOUNTER — Encounter (HOSPITAL_BASED_OUTPATIENT_CLINIC_OR_DEPARTMENT_OTHER): Payer: Self-pay | Admitting: Family Medicine

## 2023-06-06 ENCOUNTER — Encounter: Payer: Self-pay | Admitting: Internal Medicine

## 2023-06-06 ENCOUNTER — Ambulatory Visit (INDEPENDENT_AMBULATORY_CARE_PROVIDER_SITE_OTHER): Payer: 59 | Admitting: Family Medicine

## 2023-06-06 VITALS — BP 124/75 | HR 56 | Ht 67.0 in | Wt 130.8 lb

## 2023-06-06 DIAGNOSIS — N184 Chronic kidney disease, stage 4 (severe): Secondary | ICD-10-CM

## 2023-06-06 DIAGNOSIS — E785 Hyperlipidemia, unspecified: Secondary | ICD-10-CM | POA: Diagnosis not present

## 2023-06-06 DIAGNOSIS — E039 Hypothyroidism, unspecified: Secondary | ICD-10-CM

## 2023-06-06 DIAGNOSIS — Z1231 Encounter for screening mammogram for malignant neoplasm of breast: Secondary | ICD-10-CM | POA: Diagnosis not present

## 2023-06-06 DIAGNOSIS — I10 Essential (primary) hypertension: Secondary | ICD-10-CM | POA: Diagnosis not present

## 2023-06-06 NOTE — Assessment & Plan Note (Signed)
 Last TSH in November was normal.  Can continue with current dose of medication.  We will intermittently monitor labs moving forward

## 2023-06-06 NOTE — Assessment & Plan Note (Signed)
 Blood pressure at goal in office today.  Can continue with current medication regimen, no changes today

## 2023-06-06 NOTE — Assessment & Plan Note (Signed)
 Currently tolerating rosuvastatin in conjunction with Repatha.  Can continue with current regimen, no changes today.

## 2023-06-06 NOTE — Progress Notes (Signed)
 New Patient Office Visit  Subjective   Patient ID: Olivia Walsh, female    DOB: [birth year]  Age: 63 y.o. MRN: [id number]  CC:  Chief Complaint  Patient presents with   New Patient (Initial Visit)    New patient dr Astrid Divine former pcp had a fall recently     HPI Olivia Walsh presents to establish care Last PCP - Dr. Oneta Rack  She does follow with cardiology with history of HCM, hypertension, CAD, hyperlipidemia.  Current medications include Plavix, Repatha, furosemide, metoprolol, rosuvastatin, spironolactone.  Denies any issues with current medications.  She is also in a research study and is currently also taking Camzyos.  She is not having specific questions or concerns today.  Patient also with underlying CKD stage IV, hypothyroidism.  Has been stable on levothyroxine.  Patient is originally from [place].   Outpatient Encounter Medications as of 06/06/2023  Medication Sig   clopidogrel (PLAVIX) 75 MG tablet TAKE 1 TABLET  DAILY TO  PREVENT BLOOD CLOTS                                                     /                                               TAKE                                 BY                                          MOUTH   Evolocumab (REPATHA SURECLICK) 140 MG/ML SOAJ Inject 140 mg into the skin every 14 (fourteen) days.   furosemide (LASIX) 20 MG tablet TAKE 1 TABLET(20 MG) BY MOUTH DAILY AS NEEDED   levETIRAcetam (KEPPRA) 500 MG tablet TAKE 1 TABLET BY MOUTH TWICE DAILY TO PREVENT SEIZURES   Levocetirizine Dihydrochloride (XYZAL PO) Take by mouth.   levothyroxine (SYNTHROID) 50 MCG tablet TAKE 1 TABLET(50 MCG) BY MOUTH DAILY BEFORE BREAKFAST   mavacamten (CAMZYOS) 5 MG CAPS capsule Take 1 capsule (5 mg total) by mouth daily.   metoprolol succinate (TOPROL-XL) 25 MG 24 hr tablet TAKE 1 TABLET(25 MG) BY MOUTH DAILY   montelukast (SINGULAIR) 10 MG tablet Take 1 tablet (10 mg total) by mouth at bedtime.   potassium chloride (KLOR-CON M) 10 MEQ tablet  TAKE 1 TABLET(10 MEQ) BY MOUTH DAILY   rosuvastatin (CRESTOR) 40 MG tablet TAKE 1 TABLET BY MOUTH DAILY FOR CHOLESTEROL   spironolactone (ALDACTONE) 25 MG tablet Take 1 tablet (25 mg total) by mouth daily.   fluticasone (FLONASE) 50 MCG/ACT nasal spray Place 2 sprays into both nostrils daily.   No facility-administered encounter medications on file as of 06/06/2023.    Past Medical History:  Diagnosis Date   Aneurysm (HCC)    CAD (coronary artery disease) 2002   hx CABG 2002, Cath 2011 w/ occluded native vessels but LIMA-LAD, SVG-pLAD, SVG-Diag, SVG-OM patent, SVG-RCA occluded   Cholelithiasis    CKD (chronic kidney disease)  stage 3, GFR 30-59 ml/min (HCC)    History of basal cell carcinoma excision 12/20/2017   Hyperlipidemia LDL goal <70    Hypertension    Hypothyroidism    PAD (peripheral artery disease) (HCC)    Renal cyst    Tobacco abuse    Vitamin D deficiency     Past Surgical History:  Procedure Laterality Date   CATARACT EXTRACTION, BILATERAL Bilateral 2020   CEREBRAL ANEURYSM REPAIR Right 2012   CEREBRAL ANEURYSM REPAIR Right 2005   CORONARY ARTERY BYPASS GRAFT  2002   Dr Laneta Simmers   EMBOLIZATION  2005   Embolization of right PICA aneurysm    Family History  Problem Relation Age of Onset   Brain cancer Father        brain   Leukemia Sister 59   Heart disease Mother    Heart attack Mother 38   Heart disease Brother    Heart attack Brother 12   Heart attack Maternal Grandfather    Heart disease Brother 43   Multiple sclerosis Sister     Social History   Socioeconomic History   Marital status: Married    Spouse name: Not on file   Number of children: 0   Years of education: Not on file   Highest education level: 12th grade  Occupational History   Not on file  Tobacco Use   Smoking status: Every Day    Current packs/day: 1.00    Average packs/day: 1 pack/day for 50.3 years (50.3 ttl pk-yrs)    Types: Cigarettes    Start date: 54    Passive  exposure: Current   Smokeless tobacco: Never   Tobacco comments:    1/2-2 ppd   Vaping Use   Vaping status: Never Used  Substance and Sexual Activity   Alcohol use: No    Comment: rarely, 1-2 per year   Drug use: No   Sexual activity: Yes    Partners: Male    Birth control/protection: Post-menopausal  Other Topics Concern   Not on file  Social History Narrative   Not on file   Social Drivers of Health   Financial Resource Strain: Low Risk  (06/02/2023)   Overall Financial Resource Strain (CARDIA)    Difficulty of Paying Living Expenses: Not very hard  Food Insecurity: No Food Insecurity (06/02/2023)   Hunger Vital Sign    Worried About Running Out of Food in the Last Year: Never true    Ran Out of Food in the Last Year: Never true  Transportation Needs: No Transportation Needs (06/02/2023)   PRAPARE - Administrator, Civil Service (Medical): No    Lack of Transportation (Non-Medical): No  Physical Activity: Unknown (06/02/2023)   Exercise Vital Sign    Days of Exercise per Week: 0 days    Minutes of Exercise per Session: Not on file  Stress: No Stress Concern Present (06/02/2023)   [employer] of Occupational Health - Occupational Stress Questionnaire    Feeling of Stress : Not at all  Social Connections: Moderately Integrated (06/02/2023)   Social Connection and Isolation Panel [NHANES]    Frequency of Communication with Friends and Family: Three times a week    Frequency of Social Gatherings with Friends and Family: Twice a week    Attends Religious Services: 1 to 4 times per year    Active Member of [employer] or Organizations: No    Attends Banker Meetings: Not on file    Marital Status:  Married  Catering manager Violence: Not on file    Objective   BP 124/75 (BP Location: Right Arm, Patient Position: Sitting, Cuff Size: Normal)   Pulse (!) 56   Ht 5\' 7"  (1.702 m)   Wt 130 lb 12.8 oz (59.3 kg)   SpO2 100%   BMI 20.49 kg/m   Physical  Exam  63 year old female in no acute distress Cardiovascular exam with regular rate and rhythm Lungs clear to auscultation bilaterally  Assessment & Plan:   Screening mammogram for breast cancer -     3D Screening Mammogram, Left and Right; Future  Dyslipidemia Assessment & Plan: Currently tolerating rosuvastatin in conjunction with Repatha.  Can continue with current regimen, no changes today.   Essential hypertension Assessment & Plan: Blood pressure at goal in office today.  Can continue with current medication regimen, no changes today   CKD (chronic kidney disease) stage 4, GFR 15-29 ml/min (HCC) Assessment & Plan: It will be important to control underlying risk factors including blood pressure.  Can continue monitoring on labs.  Discuss nephrology   Hypothyroidism, unspecified type Assessment & Plan: Last TSH in November was normal.  Can continue with current dose of medication.  We will intermittently monitor labs moving forward   Return in about 6 months (around 12/06/2023).   Spent 47 minutes on this patient encounter, including preparation, chart review, face-to-face counseling with patient and coordination of care, and documentation of encounter   ___________________________________________ Kyliegh Jester de Peru, MD, ABFM, [facility] Primary Care and Sports Medicine [facility]

## 2023-06-06 NOTE — Assessment & Plan Note (Addendum)
 It will be important to control underlying risk factors including blood pressure.  Can continue monitoring on labs.  Discuss nephrology

## 2023-06-06 NOTE — Patient Instructions (Signed)
  Medication Instructions:  Your physician recommends that you continue on your current medications as directed. Please refer to the Current Medication list given to you today. --If you need a refill on any your medications before your next appointment, please call your pharmacy first. If no refills are authorized on file call the office.--  Follow-Up: Your next appointment:   Your physician recommends that you schedule a follow-up appointment in: 6 month  with Dr. de Peru  You will receive a text message or e-mail with a link to a survey about your care and experience with [place] today! We would greatly appreciate your feedback!   Thanks for letting us be apart of your health journey!!  Primary Care and Sports Medicine   Dr. Ceasar Mons Peru   We encourage you to activate your patient portal called "MyChart".  Sign up information is provided on this After Visit Summary.  MyChart is used to connect with patients for Virtual Visits (Telemedicine).  Patients are able to view lab/test results, encounter notes, upcoming appointments, etc.  Non-urgent messages can be sent to your provider as well. To learn more about what you can do with MyChart, please visit --  [email or web address].

## 2023-06-07 MED ORDER — MAVACAMTEN 2.5 MG PO CAPS: 2.5000 mg | ORAL_CAPSULE | Freq: Every day | ORAL | 0 refills | Status: DC

## 2023-06-07 NOTE — Telephone Encounter (Signed)
-----   Message from Christell Constant sent at 06/06/2023  1:19 PM EDT ----- Results: Peak LVOT Gradient: 6 LVEF : 65% Plan: Mavacamten dose 2.5 mg If Patient has new medications or supplements, will need medication review for potential interactions  Week 8 echo as per protocol  Christell Constant, MD

## 2023-06-07 NOTE — Telephone Encounter (Signed)
 The patient has been notified of the result and verbalized understanding.  All questions (if any) were answered. Olivia Right Hassie Mandt, RN 06/07/2023 9:39 AM  Pt reports has not started any new medications.  Denies new HF symptoms.  New script for mavacamten 2.5 mg PO every day sent to specialty pharmacy on file.

## 2023-06-21 ENCOUNTER — Ambulatory Visit (HOSPITAL_BASED_OUTPATIENT_CLINIC_OR_DEPARTMENT_OTHER): Admission: RE | Admit: 2023-06-21 | Source: Ambulatory Visit | Admitting: Radiology

## 2023-06-26 ENCOUNTER — Other Ambulatory Visit: Payer: Self-pay | Admitting: Family

## 2023-07-01 ENCOUNTER — Encounter (HOSPITAL_BASED_OUTPATIENT_CLINIC_OR_DEPARTMENT_OTHER): Payer: Self-pay | Admitting: Radiology

## 2023-07-01 ENCOUNTER — Ambulatory Visit (HOSPITAL_BASED_OUTPATIENT_CLINIC_OR_DEPARTMENT_OTHER)
Admission: RE | Admit: 2023-07-01 | Discharge: 2023-07-01 | Disposition: A | Discharge status: Home / Self Care | Payer: Self-pay | Source: Ambulatory Visit | Attending: Family Medicine | Admitting: Family Medicine

## 2023-07-01 DIAGNOSIS — Z1231 Encounter for screening mammogram for malignant neoplasm of breast: Secondary | ICD-10-CM | POA: Diagnosis present

## 2023-07-03 ENCOUNTER — Ambulatory Visit (HOSPITAL_COMMUNITY): Attending: Cardiology

## 2023-07-03 DIAGNOSIS — I421 Obstructive hypertrophic cardiomyopathy: Secondary | ICD-10-CM | POA: Diagnosis present

## 2023-07-03 LAB — ECHOCARDIOGRAM LIMITED
Area-P 1/2: 3.01 cm2
S' Lateral: 2.7 cm

## 2023-07-04 ENCOUNTER — Encounter (HOSPITAL_BASED_OUTPATIENT_CLINIC_OR_DEPARTMENT_OTHER): Payer: Self-pay | Admitting: Family Medicine

## 2023-07-05 ENCOUNTER — Telehealth: Payer: Self-pay | Admitting: Internal Medicine

## 2023-07-05 DIAGNOSIS — I421 Obstructive hypertrophic cardiomyopathy: Secondary | ICD-10-CM

## 2023-07-05 NOTE — Telephone Encounter (Signed)
 Patient notes that she is doing well.   Since last visit notes no symptoms . There are no interval hospital/ED visit.   LVEF 60% LVOT gradient 11 mm HG  No chest pain or pressure .  No SOB/DOE and no PND/Orthopnea.  No weight gain or leg swelling.  No palpitations or syncope .  No new medications.  Will do 4 week off mavacamten .  Will re-assess with induction techniques at next echo.  Suspect she will once again need 2.5 dose; she is NYHA I on this dose.  Gloriann Larger, MD FASE [facility] Cardiologist [facility]  [address] [place], [place] [address] [phone]  10:55 AM

## 2023-07-07 ENCOUNTER — Other Ambulatory Visit: Payer: Self-pay | Admitting: Internal Medicine

## 2023-07-08 NOTE — Addendum Note (Signed)
 Addended by: Christine Cozier on: 07/08/2023 09:49 AM   Modules accepted: Orders

## 2023-07-17 ENCOUNTER — Encounter: Payer: 59 | Admitting: Nurse Practitioner

## 2023-07-30 ENCOUNTER — Ambulatory Visit (HOSPITAL_COMMUNITY)
Admission: RE | Admit: 2023-07-30 | Discharge: 2023-07-30 | Disposition: A | Discharge status: Home / Self Care | Source: Ambulatory Visit | Attending: Cardiovascular Disease | Admitting: Cardiovascular Disease

## 2023-07-30 DIAGNOSIS — I421 Obstructive hypertrophic cardiomyopathy: Secondary | ICD-10-CM

## 2023-07-30 LAB — ECHOCARDIOGRAM LIMITED
Area-P 1/2: 2.39 cm2
S' Lateral: 2.5 cm

## 2023-08-01 ENCOUNTER — Other Ambulatory Visit (HOSPITAL_BASED_OUTPATIENT_CLINIC_OR_DEPARTMENT_OTHER): Payer: Self-pay | Admitting: Family Medicine

## 2023-08-01 DIAGNOSIS — E039 Hypothyroidism, unspecified: Secondary | ICD-10-CM

## 2023-08-01 MED ORDER — LEVOTHYROXINE SODIUM 50 MCG PO TABS: 50.0000 ug | ORAL_TABLET | Freq: Every day | ORAL | 2 refills | Status: DC

## 2023-08-01 NOTE — Telephone Encounter (Signed)
 Copied from CRM [id number]. Topic: Clinical - Medication Refill >> Aug 01, 2023 11:37 AM Carlatta H wrote: Medication: levothyroxine  (SYNTHROID ) 50 MCG tablet  Has the patient contacted their pharmacy? No (Agent: If no, request that the patient contact the pharmacy for the refill. If patient does not wish to contact the pharmacy document the reason why and proceed with request.) (Agent: If yes, when and what did the pharmacy advise?)  This is the patient's preferred pharmacy:  [id number] [place], [place] - [address] AT [facility] OF [address] & [facility] [address] [place] [address] Phone: [phone] Fax: [phone]    Is this the correct pharmacy for this prescription? Yes If no, delete pharmacy and type the correct one.   Has the prescription been filled recently? No  Is the patient out of the medication? Yes  Has the patient been seen for an appointment in the last year OR does the patient have an upcoming appointment? Yes  Can we respond through MyChart? No  Agent: Please be advised that Rx refills may take up to 3 business days. We ask that you follow-up with your pharmacy.

## 2023-08-04 ENCOUNTER — Ambulatory Visit: Payer: Self-pay | Admitting: Internal Medicine

## 2023-08-04 NOTE — Telephone Encounter (Signed)
 Patient notes that she is doing well.   Since last visit notes only fatigue- she attributes this to taking care of her sick husband and is not related to being off CMIs . LVEF 60%, LVOT gradient 19 mm Hg, Resting heart rate 49.  No chest pain or pressure .  No SOB/DOE and no PND/Orthopnea.  No weight gain or leg swelling.  No palpitations or syncope .  We will deactivate her from Mavacamten  (NYHA Class I) - if fatigue gets worse, we will back of metoprolol  dose to 12.5 mg PO daily - if lightheadedness or HCM sx re-occur will re-initiated CMI as per protocol.  Otherwise, fall f/u with me  Gloriann Larger, MD FASE [facility] Cardiologist [facility]  [address] [place], [place] [address] [phone]  4:09 PM

## 2023-08-15 ENCOUNTER — Other Ambulatory Visit (HOSPITAL_BASED_OUTPATIENT_CLINIC_OR_DEPARTMENT_OTHER): Payer: Self-pay | Admitting: Family Medicine

## 2023-08-15 DIAGNOSIS — I10 Essential (primary) hypertension: Secondary | ICD-10-CM

## 2023-08-15 NOTE — Telephone Encounter (Unsigned)
 Copied from CRM [id number]. Topic: Clinical - Medication Refill >> Aug 15, 2023 10:58 AM Felizardo Hotter wrote: Medication: spironolactone  (ALDACTONE ) 25 MG tablet  Has the patient contacted their pharmacy? Yes (Agent: If no, request that the patient contact the pharmacy for the refill. If patient does not wish to contact the pharmacy document the reason why and proceed with request.) (Agent: If yes, when and what did the pharmacy advise?)  This is the patient's preferred pharmacy:  [facility][id number] [place] Nest[place], [place] - [address] AT [facility] OF [address] & [facility] [address] [place] [address] Phone: [phone] Fax: [phone]  Is this the correct pharmacy for this prescription? Yes If no, delete pharmacy and type the correct one.   Has the prescription been filled recently? Yes  Is the patient out of the medication? Yes  Has the patient been seen for an appointment in the last year OR does the patient have an upcoming appointment? Yes  Can we respond through MyChart? Yes  Agent: Please be advised that Rx refills may take up to 3 business days. We ask that you follow-up with your pharmacy.

## 2023-08-16 ENCOUNTER — Telehealth (HOSPITAL_BASED_OUTPATIENT_CLINIC_OR_DEPARTMENT_OTHER): Payer: Self-pay | Admitting: *Deleted

## 2023-08-16 MED ORDER — SPIRONOLACTONE 25 MG PO TABS: 25.0000 mg | ORAL_TABLET | Freq: Every day | ORAL | 6 refills | Status: DC

## 2023-08-16 NOTE — Telephone Encounter (Signed)
 Patient requesting refill on spironolactone  sent to [facility] pharmacy. This has not been filled by you would you like to refill?

## 2023-08-21 ENCOUNTER — Encounter (HOSPITAL_BASED_OUTPATIENT_CLINIC_OR_DEPARTMENT_OTHER): Payer: Self-pay | Admitting: *Deleted

## 2023-09-09 ENCOUNTER — Other Ambulatory Visit (HOSPITAL_BASED_OUTPATIENT_CLINIC_OR_DEPARTMENT_OTHER): Payer: Self-pay | Admitting: Family Medicine

## 2023-09-09 DIAGNOSIS — R569 Unspecified convulsions: Secondary | ICD-10-CM

## 2023-09-09 DIAGNOSIS — J309 Allergic rhinitis, unspecified: Secondary | ICD-10-CM

## 2023-09-09 DIAGNOSIS — E785 Hyperlipidemia, unspecified: Secondary | ICD-10-CM

## 2023-09-09 DIAGNOSIS — E039 Hypothyroidism, unspecified: Secondary | ICD-10-CM

## 2023-09-09 MED ORDER — METOPROLOL SUCCINATE ER 25 MG PO TB24: ORAL_TABLET | ORAL | 3 refills | Status: AC

## 2023-09-09 MED ORDER — MONTELUKAST SODIUM 10 MG PO TABS: 10.0000 mg | ORAL_TABLET | Freq: Every day | ORAL | 3 refills | Status: AC

## 2023-09-09 MED ORDER — LEVETIRACETAM 500 MG PO TABS: ORAL_TABLET | ORAL | 3 refills | Status: AC

## 2023-09-09 MED ORDER — ROSUVASTATIN CALCIUM 40 MG PO TABS: ORAL_TABLET | ORAL | 3 refills | Status: AC

## 2023-09-09 MED ORDER — POTASSIUM CHLORIDE CRYS ER 10 MEQ PO TBCR: 10.0000 meq | EXTENDED_RELEASE_TABLET | Freq: Every day | ORAL | 0 refills | Status: DC

## 2023-09-09 NOTE — Telephone Encounter (Signed)
 Copied from CRM [id number]. Topic: Clinical - Medication Refill >> Sep 09, 2023 11:12 AM Elle L wrote: Medication: levETIRAcetam  (KEPPRA ) 500 MG tablet AND metoprolol  succinate (TOPROL -XL) 25 MG 24 hr tablet AND potassium chloride  (KLOR-CON  M) 10 MEQ tablet AND rosuvastatin  (CRESTOR ) 40 MG tablet AND montelukast  (SINGULAIR ) 10 MG tablet  Has the patient contacted their pharmacy? Yes, the patient's previous provider passed away and she needs all prescriptions to be prescribed by Dr. De Peru   This is the patient's preferred pharmacy:   [id number] - [place], [place] - [address] AT [facility] OF [address] & [address] [facility] [address] Phone: [phone] Fax: [phone]  Is this the correct pharmacy for this prescription? Yes  Has the prescription been filled recently? Yes  Is the patient out of the medication? No  Has the patient been seen for an appointment in the last year OR does the patient have an upcoming appointment? Yes  Can we respond through MyChart? Yes  Agent: Please be advised that Rx refills may take up to 3 business days. We ask that you follow-up with your pharmacy.

## 2023-09-17 ENCOUNTER — Encounter (HOSPITAL_BASED_OUTPATIENT_CLINIC_OR_DEPARTMENT_OTHER)

## 2023-09-23 ENCOUNTER — Encounter (HOSPITAL_BASED_OUTPATIENT_CLINIC_OR_DEPARTMENT_OTHER): Payer: Self-pay

## 2023-09-23 ENCOUNTER — Encounter (HOSPITAL_BASED_OUTPATIENT_CLINIC_OR_DEPARTMENT_OTHER)

## 2023-09-24 ENCOUNTER — Encounter (HOSPITAL_BASED_OUTPATIENT_CLINIC_OR_DEPARTMENT_OTHER)

## 2023-11-07 ENCOUNTER — Other Ambulatory Visit: Payer: Self-pay | Admitting: Internal Medicine

## 2023-11-08 ENCOUNTER — Other Ambulatory Visit: Payer: Self-pay | Admitting: Internal Medicine

## 2023-11-08 NOTE — Telephone Encounter (Signed)
*  STAT* If patient is at the pharmacy, call can be transferred to refill team.   1. Which medications need to be refilled? (please list name of each medication and dose if known) furosemide  (LASIX ) 20 MG tablet    2. Would you like to learn more about the convenience, safety, & potential cost savings by using the [facility]? No    3. Are you open to using the [facility] (Type [facility]. ). No   4. Which pharmacy/location (including street and city if local pharmacy) is medication to be sent to? [facility] #[id number] - [place],  - [address] AT [place] [address]     5. Do they need a 30 day or 90 day supply? 90 day

## 2023-11-08 NOTE — Telephone Encounter (Signed)
 Pt's medication Furosemide  20 mg tablet was sent into pt's pharmacy yesterday 11/07/2023 for 90 day supply with 1 refill without pt making an appt and pt has not been seen since 09/18/2022. CMA did this refill without asking pt to make appt. Please advise

## 2023-12-06 ENCOUNTER — Ambulatory Visit (HOSPITAL_BASED_OUTPATIENT_CLINIC_OR_DEPARTMENT_OTHER): Admitting: Family Medicine

## 2023-12-06 ENCOUNTER — Other Ambulatory Visit (HOSPITAL_BASED_OUTPATIENT_CLINIC_OR_DEPARTMENT_OTHER): Payer: Self-pay | Admitting: *Deleted

## 2023-12-06 DIAGNOSIS — E039 Hypothyroidism, unspecified: Secondary | ICD-10-CM

## 2023-12-06 MED ORDER — POTASSIUM CHLORIDE CRYS ER 10 MEQ PO TBCR: 10.0000 meq | EXTENDED_RELEASE_TABLET | Freq: Every day | ORAL | 0 refills | Status: DC

## 2023-12-11 ENCOUNTER — Other Ambulatory Visit (HOSPITAL_BASED_OUTPATIENT_CLINIC_OR_DEPARTMENT_OTHER): Payer: Self-pay | Admitting: Family Medicine

## 2023-12-11 DIAGNOSIS — E039 Hypothyroidism, unspecified: Secondary | ICD-10-CM

## 2023-12-11 NOTE — Telephone Encounter (Unsigned)
 Copied from CRM [id number]. Topic: Clinical - Medication Refill >> Dec 11, 2023  1:26 PM Tysheama G wrote: Medication: potassium chloride  (KLOR-CON  M) 10 MEQ tablet  Has the patient contacted their pharmacy? Yes (Agent: If no, request that the patient contact the pharmacy for the refill. If patient does not wish to contact the pharmacy document the reason why and proceed with request.) (Agent: If yes, when and what did the pharmacy advise?)  This is the patient's preferred pharmacy:    [id number] - [place], [place] - [address] AT [facility] OF [address] & [address] [place] [address] Phone: [phone] Fax: [phone]  Is this the correct pharmacy for this prescription? Yes If no, delete pharmacy and type the correct one.   Has the prescription been filled recently? No  Is the patient out of the medication? Yes  Has the patient been seen for an appointment in the last year OR does the patient have an upcoming appointment? Yes  Can we respond through MyChart? Yes  Agent: Please be advised that Rx refills may take up to 3 business days. We ask that you follow-up with your pharmacy.

## 2023-12-24 ENCOUNTER — Ambulatory Visit (INDEPENDENT_AMBULATORY_CARE_PROVIDER_SITE_OTHER): Admitting: *Deleted

## 2023-12-24 ENCOUNTER — Encounter (HOSPITAL_BASED_OUTPATIENT_CLINIC_OR_DEPARTMENT_OTHER): Payer: Self-pay

## 2023-12-24 VITALS — Ht 67.0 in | Wt 115.0 lb

## 2023-12-24 DIAGNOSIS — Z0000 Encounter for general adult medical examination without abnormal findings: Secondary | ICD-10-CM | POA: Diagnosis not present

## 2023-12-24 DIAGNOSIS — Z87891 Personal history of nicotine dependence: Secondary | ICD-10-CM | POA: Diagnosis not present

## 2023-12-24 NOTE — Patient Instructions (Signed)
 Olivia Walsh , Thank you for taking time to come for your Medicare Wellness Visit. I appreciate your ongoing commitment to your health goals. Please review the following plan we discussed and let me know if I can assist you in the future.   Screening recommendations/referrals: Colonoscopy:  Mammogram:  Bone Density:  Recommended yearly ophthalmology/optometry visit for glaucoma screening and checkup Recommended yearly dental visit for hygiene and checkup  Vaccinations: Influenza vaccine:  Pneumococcal vaccine:  Tdap vaccine:  Shingles vaccine:       Preventive Care 65 Years and Older, Female Preventive care refers to lifestyle choices and visits with your health care provider that can promote health and wellness. What does preventive care include? A yearly physical exam. This is also called an annual well check. Dental exams once or twice a year. Routine eye exams. Ask your health care provider how often you should have your eyes checked. Personal lifestyle choices, including: Daily care of your teeth and gums. Regular physical activity. Eating a healthy diet. Avoiding tobacco and drug use. Limiting alcohol use. Practicing safe sex. Taking low-dose aspirin  every day. Taking vitamin and mineral supplements as recommended by your health care provider. What happens during an annual well check? The services and screenings done by your health care provider during your annual well check will depend on your age, overall health, lifestyle risk factors, and family history of disease. Counseling  Your health care provider may ask you questions about your: Alcohol use. Tobacco use. Drug use. Emotional well-being. Home and relationship well-being. Sexual activity. Eating habits. History of falls. Memory and ability to understand (cognition). Work and work astronomer. Reproductive health. Screening  You may have the following tests or measurements: Height, weight, and BMI. Blood  pressure. Lipid and cholesterol levels. These may be checked every 5 years, or more frequently if you are over 62 years old. Skin check. Lung cancer screening. You may have this screening every year starting at age 48 if you have a 30-pack-year history of smoking and currently smoke or have quit within the past 15 years. Fecal occult blood test (FOBT) of the stool. You may have this test every year starting at age 70. Flexible sigmoidoscopy or colonoscopy. You may have a sigmoidoscopy every 5 years or a colonoscopy every 10 years starting at age 71. Hepatitis C blood test. Hepatitis B blood test. Sexually transmitted disease (STD) testing. Diabetes screening. This is done by checking your blood sugar (glucose) after you have not eaten for a while (fasting). You may have this done every 1-3 years. Bone density scan. This is done to screen for osteoporosis. You may have this done starting at age 54. Mammogram. This may be done every 1-2 years. Talk to your health care provider about how often you should have regular mammograms. Talk with your health care provider about your test results, treatment options, and if necessary, the need for more tests. Vaccines  Your health care provider may recommend certain vaccines, such as: Influenza vaccine. This is recommended every year. Tetanus, diphtheria, and acellular pertussis (Tdap, Td) vaccine. You may need a Td booster every 10 years. Zoster vaccine. You may need this after age 39. Pneumococcal 13-valent conjugate (PCV13) vaccine. One dose is recommended after age 48. Pneumococcal polysaccharide (PPSV23) vaccine. One dose is recommended after age 8. Talk to your health care provider about which screenings and vaccines you need and how often you need them. This information is not intended to replace advice given to you by your health care  provider. Make sure you discuss any questions you have with your health care provider. Document Released:  03/11/2015 Document Revised: 11/02/2015 Document Reviewed: 12/14/2014 Elsevier Interactive Patient Education  2017 [employer].  Fall Prevention in the Home Falls can cause injuries. They can happen to people of all ages. There are many things you can do to make your home safe and to help prevent falls. What can I do on the outside of my home? Regularly fix the edges of walkways and driveways and fix any cracks. Remove anything that might make you trip as you walk through a door, such as a raised step or threshold. Trim any bushes or trees on the path to your home. Use bright outdoor lighting. Clear any walking paths of anything that might make someone trip, such as rocks or tools. Regularly check to see if handrails are loose or broken. Make sure that both sides of any steps have handrails. Any raised decks and porches should have guardrails on the edges. Have any leaves, snow, or ice cleared regularly. Use sand or salt on walking paths during winter. Clean up any spills in your garage right away. This includes oil or grease spills. What can I do in the bathroom? Use night lights. Install grab bars by the toilet and in the tub and shower. Do not use towel bars as grab bars. Use non-skid mats or decals in the tub or shower. If you need to sit down in the shower, use a plastic, non-slip stool. Keep the floor dry. Clean up any water that spills on the floor as soon as it happens. Remove soap buildup in the tub or shower regularly. Attach bath mats securely with double-sided non-slip rug tape. Do not have throw rugs and other things on the floor that can make you trip. What can I do in the bedroom? Use night lights. Make sure that you have a light by your bed that is easy to reach. Do not use any sheets or blankets that are too big for your bed. They should not hang down onto the floor. Have a firm chair that has side arms. You can use this for support while you get dressed. Do not have  throw rugs and other things on the floor that can make you trip. What can I do in the kitchen? Clean up any spills right away. Avoid walking on wet floors. Keep items that you use a lot in easy-to-reach places. If you need to reach something above you, use a strong step stool that has a grab bar. Keep electrical cords out of the way. Do not use floor polish or wax that makes floors slippery. If you must use wax, use non-skid floor wax. Do not have throw rugs and other things on the floor that can make you trip. What can I do with my stairs? Do not leave any items on the stairs. Make sure that there are handrails on both sides of the stairs and use them. Fix handrails that are broken or loose. Make sure that handrails are as long as the stairways. Check any carpeting to make sure that it is firmly attached to the stairs. Fix any carpet that is loose or worn. Avoid having throw rugs at the top or bottom of the stairs. If you do have throw rugs, attach them to the floor with carpet tape. Make sure that you have a light switch at the top of the stairs and the bottom of the stairs. If you do not have  them, ask someone to add them for you. What else can I do to help prevent falls? Wear shoes that: Do not have high heels. Have rubber bottoms. Are comfortable and fit you well. Are closed at the toe. Do not wear sandals. If you use a stepladder: Make sure that it is fully opened. Do not climb a closed stepladder. Make sure that both sides of the stepladder are locked into place. Ask someone to hold it for you, if possible. Clearly mark and make sure that you can see: Any grab bars or handrails. First and last steps. Where the edge of each step is. Use tools that help you move around (mobility aids) if they are needed. These include: Canes. Walkers. Scooters. Crutches. Turn on the lights when you go into a dark area. Replace any light bulbs as soon as they burn out. Set up your furniture so  you have a clear path. Avoid moving your furniture around. If any of your floors are uneven, fix them. If there are any pets around you, be aware of where they are. Review your medicines with your doctor. Some medicines can make you feel dizzy. This can increase your chance of falling. Ask your doctor what other things that you can do to help prevent falls. This information is not intended to replace advice given to you by your health care provider. Make sure you discuss any questions you have with your health care provider. Document Released: 12/09/2008 Document Revised: 07/21/2015 Document Reviewed: 03/19/2014 Elsevier Interactive Patient Education  2017 [employer].

## 2023-12-24 NOTE — Progress Notes (Signed)
 Subjective:   Olivia Walsh is a 63 y.o. female who presents for Medicare Annual (Subsequent) preventive examination.   Visit Complete: Virtual I connected with  Olivia Walsh on 12/24/23 by a audio enabled telemedicine application and verified that I am speaking with the correct person using two identifiers.  Patient Location: Home  Provider Location: Home Office  I discussed the limitations of evaluation and management by telemedicine. The patient expressed understanding and agreed to proceed.  Vital Signs: Because this visit was a virtual/telehealth visit, some criteria may be missing or patient reported. Any vitals not documented were not able to be obtained and vitals that have been documented are patient reported.  Cardiac Risk Factors include: advanced age (>42men, >67 women);hypertension;family history of premature cardiovascular disease;obesity (BMI >30kg/m2);smoking/ tobacco exposure     Objective:    Today's Vitals   12/24/23 1024  Weight: 115 lb (52.2 kg)  Height: 5' 7 (1.702 m)   Body mass index is 18.01 kg/m.     12/24/2023   10:23 AM 01/30/2021    8:13 PM 01/30/2021    2:54 PM  Advanced Directives  Does Patient Have a Medical Advance Directive? No No No  Would patient like information on creating a medical advance directive? No - Patient declined No - Patient declined     Current Medications (verified) Outpatient Encounter Medications as of 12/24/2023  Medication Sig   clopidogrel  (PLAVIX ) 75 MG tablet TAKE 1 TABLET  DAILY TO  PREVENT BLOOD CLOTS                                                     /                                               TAKE                                 BY                                          MOUTH   Evolocumab  (REPATHA  SURECLICK) 140 MG/ML SOAJ Inject 140 mg into the skin every 14 (fourteen) days.   fluticasone  (FLONASE ) 50 MCG/ACT nasal spray Place 2 sprays into both nostrils daily.   furosemide  (LASIX ) 20 MG  tablet TAKE 1 TABLET(20 MG) BY MOUTH DAILY AS NEEDED   levETIRAcetam  (KEPPRA ) 500 MG tablet TAKE 1 TABLET BY MOUTH TWICE DAILY TO PREVENT SEIZURES   Levocetirizine Dihydrochloride (XYZAL PO) Take by mouth.   levothyroxine  (SYNTHROID ) 50 MCG tablet Take 1 tablet (50 mcg total) by mouth daily before breakfast.   mavacamten  (CAMZYOS ) 2.5 MG CAPS capsule Take 1 capsule (2.5 mg total) by mouth daily.   metoprolol  succinate (TOPROL -XL) 25 MG 24 hr tablet TAKE 1 TABLET(25 MG) BY MOUTH DAILY   montelukast  (SINGULAIR ) 10 MG tablet Take 1 tablet (10 mg total) by mouth at bedtime.   potassium chloride  (KLOR-CON  M) 10 MEQ tablet Take 1 tablet (10 mEq total) by mouth daily.   rosuvastatin  (CRESTOR )  40 MG tablet TAKE 1 TABLET BY MOUTH  DAILY FOR CHOLESTEROL   spironolactone  (ALDACTONE ) 25 MG tablet Take 1 tablet (25 mg total) by mouth daily.   No facility-administered encounter medications on file as of 12/24/2023.    Allergies (verified) Ace inhibitors, Penicillins, and Prednisone    History: Past Medical History:  Diagnosis Date   Aneurysm    CAD (coronary artery disease) 2002   hx CABG 2002, Cath 2011 w/ occluded native vessels but LIMA-LAD, SVG-pLAD, SVG-Diag, SVG-OM patent, SVG-RCA occluded   Cholelithiasis    CKD (chronic kidney disease) stage 3, GFR 30-59 ml/min (HCC)    History of basal cell carcinoma excision 12/20/2017   Hyperlipidemia LDL goal <70    Hypertension    Hypothyroidism    PAD (peripheral artery disease)    Renal cyst    Tobacco abuse    Vitamin D  deficiency    Past Surgical History:  Procedure Laterality Date   CATARACT EXTRACTION, BILATERAL Bilateral 2020   CEREBRAL ANEURYSM REPAIR Right 2012   CEREBRAL ANEURYSM REPAIR Right 2005   CORONARY ARTERY BYPASS GRAFT  2002   Dr Lucas   EMBOLIZATION  2005   Embolization of right PICA aneurysm   Family History  Problem Relation Age of Onset   Brain cancer Father        brain   Leukemia Sister 12   Heart disease  Mother    Heart attack Mother 87   Heart disease Brother    Heart attack Brother 30   Heart attack Maternal Grandfather    Heart disease Brother 34   Multiple sclerosis Sister    Social History   Socioeconomic History   Marital status: Married    Spouse name: Not on file   Number of children: 0   Years of education: Not on file   Highest education level: 12th grade  Occupational History   Not on file  Tobacco Use   Smoking status: Every Day    Current packs/day: 1.00    Average packs/day: 1 pack/day for 50.8 years (50.8 ttl pk-yrs)    Types: Cigarettes    Start date: 43    Passive exposure: Current   Smokeless tobacco: Never   Tobacco comments:    1/2-2 ppd   Vaping Use   Vaping status: Never Used  Substance and Sexual Activity   Alcohol use: No    Comment: rarely, 1-2 per year   Drug use: No   Sexual activity: Yes    Partners: Male    Birth control/protection: Post-menopausal  Other Topics Concern   Not on file  Social History Narrative   Not on file   Social Drivers of Health   Financial Resource Strain: Low Risk  (12/24/2023)   Overall Financial Resource Strain (CARDIA)    Difficulty of Paying Living Expenses: Not very hard  Food Insecurity: No Food Insecurity (12/24/2023)   Hunger Vital Sign    Worried About Running Out of Food in the Last Year: Never true    Ran Out of Food in the Last Year: Never true  Transportation Needs: No Transportation Needs (12/24/2023)   PRAPARE - Administrator, Civil Service (Medical): No    Lack of Transportation (Non-Medical): No  Physical Activity: Inactive (12/24/2023)   Exercise Vital Sign    Days of Exercise per Week: 0 days    Minutes of Exercise per Session: 0 min  Stress: No Stress Concern Present (12/24/2023)   [employer] of Occupational Health -  Occupational Stress Questionnaire    Feeling of Stress: Not at all  Social Connections: Moderately Integrated (12/24/2023)   Social Connection and  Isolation Panel    Frequency of Communication with Friends and Family: Three times a week    Frequency of Social Gatherings with Friends and Family: Twice a week    Attends Religious Services: 1 to 4 times per year    Active Member of [employer] or Organizations: No    Attends Engineer, Structural: Not on file    Marital Status: Married    Tobacco Counseling Ready to quit: Not Answered Counseling given: Not Answered Tobacco comments: 1/2-2 ppd    Clinical Intake:  Pre-visit preparation completed: Yes  Pain : No/denies pain     Diabetes: No  How often do you need to have someone help you when you read instructions, pamphlets, or other written materials from your doctor or pharmacy?: 1 - Never  Interpreter Needed?: No  Information entered by :: Mliss Graff LPN   Activities of Daily Living    12/24/2023   10:24 AM 12/21/2023    8:12 AM  In your present state of health, do you have any difficulty performing the following activities:  Hearing? 0 0  Vision? 0 0  Difficulty concentrating or making decisions? 0 0  Walking or climbing stairs? 0 0  Dressing or bathing? 0 0  Doing errands, shopping? 0 0  Preparing Food and eating ? N N  Using the Toilet? N N  In the past six months, have you accidently leaked urine? N N  Do you have problems with loss of bowel control? N N  Managing your Medications? N N  Managing your Finances? N N  Housekeeping or managing your Housekeeping? N N    Patient Care Team: [place], Quintin PARAS, MD as PCP - General (Family Medicine) Lavona Agent, MD as PCP - Cardiology (Cardiology) Prescilla Beams, MD as Consulting Physician (Nephrology)  Indicate any recent Medical Services you may have received from other than [facility] providers in the past year (date may be approximate).     Assessment:   This is a routine wellness examination for Nolita.  Hearing/Vision screen Hearing Screening - Comments:: No trouble hearing Vision  Screening - Comments:: Not up to date Cataract surgery and has been fine   Goals Addressed             This Visit's Progress    DIET - INCREASE WATER INTAKE   On track    Aim for 4-5 bottles daily      Patient Stated       Increase physical activity     Quit Smoking   Not on track      Depression Screen    12/24/2023   10:28 AM 06/06/2023    1:15 PM 01/30/2021   10:05 AM 05/04/2020    2:09 PM 04/20/2019    3:16 PM 04/02/2018    3:00 PM 01/31/2017    3:11 PM  PHQ 2/9 Scores  PHQ - 2 Score 0 0 0 1 0 0 0  PHQ- 9 Score 0 0         Fall Risk    12/24/2023   10:21 AM 12/21/2023    8:12 AM 06/06/2023    1:15 PM 01/30/2021   10:05 AM 09/30/2016    6:08 PM  Fall Risk   Falls in the past year? 1 1 1  0 No   Number falls in past yr: 1 1  0    Injury with Fall? 0 0 1    Risk for fall due to : Impaired balance/gait;Impaired mobility  History of fall(s);Impaired balance/gait No Fall Risks   Follow up Falls evaluation completed;Education provided;Falls prevention discussed  Falls evaluation completed;Education provided;Falls prevention discussed Falls evaluation completed;Education provided;Falls prevention discussed       Data saved with a previous flowsheet row definition    MEDICARE RISK AT HOME: Medicare Risk at Home Any stairs in or around the home?: No If so, are there any without handrails?: No Home free of loose throw rugs in walkways, pet beds, electrical cords, etc?: Yes Adequate lighting in your home to reduce risk of falls?: Yes Life alert?: No Use of a cane, walker or w/c?: Yes Grab bars in the bathroom?: Yes Shower chair or bench in shower?: Yes Elevated toilet seat or a handicapped toilet?: Yes  TIMED UP AND GO:  Was the test performed?  No    Cognitive Function:        12/24/2023   10:26 AM  6CIT Screen  What Year? 0 points  What month? 0 points  What time? 0 points  Count back from 20 0 points  Months in reverse 0 points  Repeat phrase 2 points   Total Score 2 points    Immunizations Immunization History  Administered Date(s) Administered   Influenza Inj Mdck Quad With Preservative 01/31/2017, 12/23/2017, 01/05/2020   Influenza Split 12/02/2014   Influenza,inj,quad, With Preservative 12/20/2015   PFIZER(Purple Top)SARS-COV-2 Vaccination 07/13/2019, 08/07/2019, 04/06/2020   PPD Test 10/09/2013, 12/02/2014   Td 10/24/2006, 01/31/2017    TDAP status: Up to date  Flu Vaccine status: Due, Education has been provided regarding the importance of this vaccine. Advised may receive this vaccine at local pharmacy or Health Dept. Aware to provide a copy of the vaccination record if obtained from local pharmacy or Health Dept. Verbalized acceptance and understanding.  Pneumococcal vaccine status: Due, Education has been provided regarding the importance of this vaccine. Advised may receive this vaccine at local pharmacy or Health Dept. Aware to provide a copy of the vaccination record if obtained from local pharmacy or Health Dept. Verbalized acceptance and understanding.  Covid-19 vaccine status: Information provided on how to obtain vaccines.   Qualifies for Shingles Vaccine? Yes   Zostavax completed No   Shingrix Completed?: No.    Education has been provided regarding the importance of this vaccine. Patient has been advised to call insurance company to determine out of pocket expense if they have not yet received this vaccine. Advised may also receive vaccine at local pharmacy or Health Dept. Verbalized acceptance and understanding.  Screening Tests Health Maintenance  Topic Date Due   Pneumococcal Vaccine: 50+ Years (1 of 2 - PCV) Never done   Zoster Vaccines- Shingrix (1 of 2) Never done   Cervical Cancer Screening (HPV/Pap Cotest)  Never done   Fecal DNA (Cologuard)  Never done   Lung Cancer Screening  05/21/2023   DEXA SCAN  06/19/2023   Influenza Vaccine  09/27/2023   COVID-19 Vaccine (4 - 2025-26 season) 10/28/2023    Medicare Annual Wellness (AWV)  12/23/2024   Mammogram  06/30/2025   DTaP/Tdap/Td (3 - Tdap) 02/01/2027   Hepatitis C Screening  Completed   HIV Screening  Completed   Hepatitis B Vaccines 19-59 Average Risk  Aged Out   Meningococcal B Vaccine  Aged Out    Health Maintenance  Health Maintenance Due  Topic Date Due   Pneumococcal  Vaccine: 50+ Years (1 of 2 - PCV) Never done   Zoster Vaccines- Shingrix (1 of 2) Never done   Cervical Cancer Screening (HPV/Pap Cotest)  Never done   Fecal DNA (Cologuard)  Never done   Lung Cancer Screening  05/21/2023   DEXA SCAN  06/19/2023   Influenza Vaccine  09/27/2023   COVID-19 Vaccine (4 - 2025-26 season) 10/28/2023    Colonoscopy  Education provided   patient will discuss with Md at up coming visit  Mammogram status: Completed  . Repeat every year  Bone Density Education provided  Lung Cancer Screening: (Low Dose CT Chest recommended if Age 83-80 years, 20 pack-year currently smoking OR have quit w/in 15years.) does qualify.   Lung Cancer Screening Referral: ordered  Additional Screening:  Hepatitis C Screening: does not qualify; Completed 2022  Vision Screening: Recommended annual ophthalmology exams for early detection of glaucoma and other disorders of the eye. Is the patient up to date with their annual eye exam?  No  Who is the provider or what is the name of the office in which the patient attends annual eye exams?  If pt is not established with a provider, would they like to be referred to a provider to establish care? No .   Dental Screening: Recommended annual dental exams for proper oral hygiene    Community Resource Referral / Chronic Care Management: CRR required this visit?  No   CCM required this visit?  No     Plan:     I have personally reviewed and noted the following in the patient's chart:   Medical and social history Use of alcohol, tobacco or illicit drugs  Current medications and supplements  including opioid prescriptions. Patient is not currently taking opioid prescriptions. Functional ability and status Nutritional status Physical activity Advanced directives List of other physicians Hospitalizations, surgeries, and ER visits in previous 12 months Vitals Screenings to include cognitive, depression, and falls Referrals and appointments  In addition, I have reviewed and discussed with patient certain preventive protocols, quality metrics, and best practice recommendations. A written personalized care plan for preventive services as well as general preventive health recommendations were provided to patient.     Mliss Graff, LPN   [id number]   After Visit Summary: (MyChart) Due to this being a telephonic visit, the after visit summary with patients personalized plan was offered to patient via MyChart   Nurse Notes:

## 2024-01-07 ENCOUNTER — Ambulatory Visit (HOSPITAL_BASED_OUTPATIENT_CLINIC_OR_DEPARTMENT_OTHER): Admitting: Family Medicine

## 2024-01-14 ENCOUNTER — Telehealth: Payer: Self-pay | Admitting: Acute Care

## 2024-01-14 DIAGNOSIS — F17210 Nicotine dependence, cigarettes, uncomplicated: Secondary | ICD-10-CM

## 2024-01-14 DIAGNOSIS — Z87891 Personal history of nicotine dependence: Secondary | ICD-10-CM

## 2024-01-14 DIAGNOSIS — Z122 Encounter for screening for malignant neoplasm of respiratory organs: Secondary | ICD-10-CM

## 2024-01-14 NOTE — Telephone Encounter (Signed)
 Lung Cancer Screening Narrative/Criteria Questionnaire (Cigarette Smokers Only- No Cigars/Pipes/vapes)   Olivia Walsh   SDMV:01/30/2024 1030 [name]      1960-12-12   LDCT: 01/31/2024 1140 GI    63 y.o.   Phone: [phone]  Lung Screening Narrative (confirm age 83-77 yrs Medicare / 50-80 yrs Private pay insurance)   Insurance information:HTA   Referring Provider:Dr. De Cuba   This screening involves an initial phone call with a team member from our program. It is called a shared decision making visit. The initial meeting is required by  insurance and Medicare to make sure you understand the program. This appointment takes about 15-20 minutes to complete. You will complete the screening scan at your scheduled date/time.  This scan takes about 5-10 minutes to complete. You can eat and drink normally before and after the scan.  Criteria questions for Lung Cancer Screening:   Are you a current or former smoker? Current Age began smoking: 63yo   If you are a former smoker, what year did you quit smoking? Quit for 2 years then started back again (within 15 yrs)   To calculate your smoking history, I need an accurate estimate of how many packs of cigarettes you smoked per day and for how many years. (Not just the number of PPD you are now smoking)   Years smoking 45 x Packs per day 1 = Pack years 45   (at least 20 pack yrs)   (Make sure they understand that we need to know how much they have smoked in the past, not just the number of PPD they are smoking now)  Do you have a personal history of cancer?  No    Do you have a family history of cancer? Yes  (cancer type and and relative) Father - brain, sister - leukemia   Are you coughing up blood?  No  Have you had unexplained weight loss of 15 lbs or more in the last 6 months? No  It looks like you meet all criteria.  When would be a good time for us  to schedule you for this screening?   Additional information: N/A

## 2024-01-22 ENCOUNTER — Ambulatory Visit: Payer: Self-pay

## 2024-01-22 ENCOUNTER — Ambulatory Visit: Admitting: Internal Medicine

## 2024-01-22 NOTE — Telephone Encounter (Signed)
 Patient requesting further advice on building muscle mass and possibly a telephone visit with her PCP  FYI Only or Action Required?: Action required by provider: clinical question for provider and update on patient condition.  Patient was last seen in primary care on 06/06/2023 by de Cuba, Quintin PARAS, MD.  Called Nurse Triage reporting Weakness.  Symptoms began over the past year.  Interventions attempted: Rest, hydration, or home remedies and Dietary changes.  Symptoms are: gradually worsening.  Triage Disposition: See PCP Within 2 Weeks  Patient/caregiver understands and will follow disposition?: Unsure               Copied from CRM [id number]. Topic: Clinical - Red Word Triage >> Jan 22, 2024  9:26 AM Graeme ORN wrote: Red Word that prompted transfer to Nurse Triage: Dizziness, loss of weight/muscle mass - unable to walk now without cane. Drinking ensure and peanut butter - lost 75 lbs. Does not think she can make it in to appt. Reason for Disposition  Weakness is a chronic symptom (recurrent or ongoing AND present > 4 weeks)  Answer Assessment - Initial Assessment Questions Patient states her husband passed away at the beginning of September. She states he had been sick for a year and in this time, she states that she lost about 54 pounds--patient attributes this to not wanting to eat a lot when he was sick and unable to eat a lot. She states that she feels fine but just wanted help to build muscle up.  In the last 30 days, she states that she has lost a lot of what she believes is muscle mass She has started drinking Ensure and doing chair yoga this week. She states that she is walking with a cane at this time. She wants advise to be able to build the muscle mass up quicker.  Patient checks her blood pressure and temperature and those are normal Patient states that she has not fallen but sometimes she will feel a little dizzy  Patient states that she has an upcoming  visit in December but she doesn't know if she will be strong enough at that time unless she uses a wheelchair. She would like to have a telephone visit with her provider if possible and if he wants her to come in earlier than December she will try to arrange someone to help her get to and into the office. She wanted further advice from her PCP Dr Everitt Cuba  Patient is advised to call us  back if anything changes or with any further questions/concerns. Patient is advised that if anything worsens to go to the Emergency Room. Patient verbalized understanding.  Protocols used: Weakness (Generalized) and Fatigue-A-AH

## 2024-01-22 NOTE — Telephone Encounter (Signed)
 Called and spoke with pt about her upcoming appt and made her aware that there were wheelchairs in the lobby that she could use if needed. Pt stated she would try to see if she could get someone to bring her to the appt otherwise she would call to reschedule appt.

## 2024-01-23 ENCOUNTER — Other Ambulatory Visit: Payer: Self-pay

## 2024-01-23 ENCOUNTER — Emergency Department (HOSPITAL_BASED_OUTPATIENT_CLINIC_OR_DEPARTMENT_OTHER)

## 2024-01-23 ENCOUNTER — Inpatient Hospital Stay (HOSPITAL_BASED_OUTPATIENT_CLINIC_OR_DEPARTMENT_OTHER)
Admission: EM | Admit: 2024-01-23 | Discharge: 2024-01-27 | DRG: 682 | Disposition: A | Attending: Internal Medicine | Admitting: Internal Medicine

## 2024-01-23 ENCOUNTER — Encounter (HOSPITAL_BASED_OUTPATIENT_CLINIC_OR_DEPARTMENT_OTHER): Payer: Self-pay | Admitting: Emergency Medicine

## 2024-01-23 DIAGNOSIS — Z87898 Personal history of other specified conditions: Secondary | ICD-10-CM

## 2024-01-23 DIAGNOSIS — E43 Unspecified severe protein-calorie malnutrition: Secondary | ICD-10-CM | POA: Diagnosis present

## 2024-01-23 DIAGNOSIS — I2510 Atherosclerotic heart disease of native coronary artery without angina pectoris: Secondary | ICD-10-CM | POA: Diagnosis present

## 2024-01-23 DIAGNOSIS — N179 Acute kidney failure, unspecified: Secondary | ICD-10-CM | POA: Diagnosis not present

## 2024-01-23 DIAGNOSIS — Z808 Family history of malignant neoplasm of other organs or systems: Secondary | ICD-10-CM

## 2024-01-23 DIAGNOSIS — R7401 Elevation of levels of liver transaminase levels: Secondary | ICD-10-CM | POA: Diagnosis present

## 2024-01-23 DIAGNOSIS — E875 Hyperkalemia: Secondary | ICD-10-CM | POA: Diagnosis present

## 2024-01-23 DIAGNOSIS — E860 Dehydration: Secondary | ICD-10-CM | POA: Diagnosis present

## 2024-01-23 DIAGNOSIS — Z806 Family history of leukemia: Secondary | ICD-10-CM

## 2024-01-23 DIAGNOSIS — N189 Chronic kidney disease, unspecified: Secondary | ICD-10-CM

## 2024-01-23 DIAGNOSIS — I447 Left bundle-branch block, unspecified: Secondary | ICD-10-CM | POA: Diagnosis present

## 2024-01-23 DIAGNOSIS — I421 Obstructive hypertrophic cardiomyopathy: Secondary | ICD-10-CM | POA: Diagnosis present

## 2024-01-23 DIAGNOSIS — N83202 Unspecified ovarian cyst, left side: Secondary | ICD-10-CM | POA: Diagnosis not present

## 2024-01-23 DIAGNOSIS — R54 Age-related physical debility: Secondary | ICD-10-CM | POA: Diagnosis present

## 2024-01-23 DIAGNOSIS — Z951 Presence of aortocoronary bypass graft: Secondary | ICD-10-CM

## 2024-01-23 DIAGNOSIS — F17210 Nicotine dependence, cigarettes, uncomplicated: Secondary | ICD-10-CM | POA: Diagnosis present

## 2024-01-23 DIAGNOSIS — D631 Anemia in chronic kidney disease: Secondary | ICD-10-CM | POA: Diagnosis present

## 2024-01-23 DIAGNOSIS — Z9842 Cataract extraction status, left eye: Secondary | ICD-10-CM

## 2024-01-23 DIAGNOSIS — I10 Essential (primary) hypertension: Secondary | ICD-10-CM | POA: Diagnosis not present

## 2024-01-23 DIAGNOSIS — E876 Hypokalemia: Secondary | ICD-10-CM | POA: Diagnosis present

## 2024-01-23 DIAGNOSIS — M6282 Rhabdomyolysis: Secondary | ICD-10-CM | POA: Diagnosis present

## 2024-01-23 DIAGNOSIS — E8729 Other acidosis: Secondary | ICD-10-CM | POA: Diagnosis present

## 2024-01-23 DIAGNOSIS — I739 Peripheral vascular disease, unspecified: Secondary | ICD-10-CM | POA: Diagnosis present

## 2024-01-23 DIAGNOSIS — Z9841 Cataract extraction status, right eye: Secondary | ICD-10-CM

## 2024-01-23 DIAGNOSIS — R7989 Other specified abnormal findings of blood chemistry: Secondary | ICD-10-CM | POA: Diagnosis present

## 2024-01-23 DIAGNOSIS — Z79899 Other long term (current) drug therapy: Secondary | ICD-10-CM

## 2024-01-23 DIAGNOSIS — Z820 Family history of epilepsy and other diseases of the nervous system: Secondary | ICD-10-CM

## 2024-01-23 DIAGNOSIS — Z8673 Personal history of transient ischemic attack (TIA), and cerebral infarction without residual deficits: Secondary | ICD-10-CM

## 2024-01-23 DIAGNOSIS — Z7902 Long term (current) use of antithrombotics/antiplatelets: Secondary | ICD-10-CM

## 2024-01-23 DIAGNOSIS — Z8249 Family history of ischemic heart disease and other diseases of the circulatory system: Secondary | ICD-10-CM

## 2024-01-23 DIAGNOSIS — Z85828 Personal history of other malignant neoplasm of skin: Secondary | ICD-10-CM

## 2024-01-23 DIAGNOSIS — Z79890 Hormone replacement therapy (postmenopausal): Secondary | ICD-10-CM

## 2024-01-23 DIAGNOSIS — E039 Hypothyroidism, unspecified: Secondary | ICD-10-CM | POA: Diagnosis present

## 2024-01-23 DIAGNOSIS — G40909 Epilepsy, unspecified, not intractable, without status epilepticus: Secondary | ICD-10-CM | POA: Diagnosis present

## 2024-01-23 DIAGNOSIS — N184 Chronic kidney disease, stage 4 (severe): Secondary | ICD-10-CM | POA: Diagnosis present

## 2024-01-23 DIAGNOSIS — E559 Vitamin D deficiency, unspecified: Secondary | ICD-10-CM | POA: Diagnosis present

## 2024-01-23 DIAGNOSIS — I2489 Other forms of acute ischemic heart disease: Secondary | ICD-10-CM | POA: Diagnosis present

## 2024-01-23 DIAGNOSIS — I129 Hypertensive chronic kidney disease with stage 1 through stage 4 chronic kidney disease, or unspecified chronic kidney disease: Secondary | ICD-10-CM | POA: Diagnosis present

## 2024-01-23 DIAGNOSIS — Z681 Body mass index (BMI) 19 or less, adult: Secondary | ICD-10-CM

## 2024-01-23 DIAGNOSIS — E872 Acidosis, unspecified: Secondary | ICD-10-CM | POA: Diagnosis present

## 2024-01-23 DIAGNOSIS — R627 Adult failure to thrive: Secondary | ICD-10-CM | POA: Diagnosis present

## 2024-01-23 DIAGNOSIS — E785 Hyperlipidemia, unspecified: Secondary | ICD-10-CM | POA: Diagnosis present

## 2024-01-23 LAB — COMPREHENSIVE METABOLIC PANEL WITH GFR
ALT: 141 U/L — ABNORMAL HIGH (ref 0–44)
AST: 270 U/L — ABNORMAL HIGH (ref 15–41)
Albumin: 4.2 g/dL (ref 3.5–5.0)
Alkaline Phosphatase: 50 U/L (ref 38–126)
Anion gap: 22 — ABNORMAL HIGH (ref 5–15)
BUN: 61 mg/dL — ABNORMAL HIGH (ref 8–23)
CO2: 17 mmol/L — ABNORMAL LOW (ref 22–32)
Calcium: 9.4 mg/dL (ref 8.9–10.3)
Chloride: 95 mmol/L — ABNORMAL LOW (ref 98–111)
Creatinine, Ser: 4.07 mg/dL — ABNORMAL HIGH (ref 0.44–1.00)
GFR, Estimated: 12 mL/min — ABNORMAL LOW (ref >60)
Glucose, Bld: 74 mg/dL (ref 70–99)
Potassium: 3.9 mmol/L (ref 3.5–5.1)
Sodium: 134 mmol/L — ABNORMAL LOW (ref 135–145)
Total Bilirubin: 0.3 mg/dL (ref 0.0–1.2)
Total Protein: 7 g/dL (ref 6.5–8.1)

## 2024-01-23 LAB — URINALYSIS, ROUTINE W REFLEX MICROSCOPIC
Bilirubin Urine: NEGATIVE
Glucose, UA: NEGATIVE mg/dL
Ketones, ur: NEGATIVE mg/dL
Nitrite: NEGATIVE
Protein, ur: 30 mg/dL — AB
Specific Gravity, Urine: 1.009 (ref 1.005–1.030)
pH: 5.5 (ref 5.0–8.0)

## 2024-01-23 LAB — CBC WITH DIFFERENTIAL/PLATELET
Abs Immature Granulocytes: 0.01 K/uL (ref 0.00–0.07)
Basophils Absolute: 0.1 K/uL (ref 0.0–0.1)
Basophils Relative: 1 %
Eosinophils Absolute: 0.1 K/uL (ref 0.0–0.5)
Eosinophils Relative: 1 %
HCT: 33.4 % — ABNORMAL LOW (ref 36.0–46.0)
Hemoglobin: 11.1 g/dL — ABNORMAL LOW (ref 12.0–15.0)
Immature Granulocytes: 0 %
Lymphocytes Relative: 18 %
Lymphs Abs: 1.4 K/uL (ref 0.7–4.0)
MCH: 30.7 pg (ref 26.0–34.0)
MCHC: 33.2 g/dL (ref 30.0–36.0)
MCV: 92.3 fL (ref 80.0–100.0)
Monocytes Absolute: 0.8 K/uL (ref 0.1–1.0)
Monocytes Relative: 11 %
Neutro Abs: 5.1 K/uL (ref 1.7–7.7)
Neutrophils Relative %: 69 %
Platelets: 180 K/uL (ref 150–400)
RBC: 3.62 MIL/uL — ABNORMAL LOW (ref 3.87–5.11)
RDW: 15 % (ref 11.5–15.5)
WBC: 7.5 K/uL (ref 4.0–10.5)
nRBC: 0 % (ref 0.0–0.2)

## 2024-01-23 LAB — MAGNESIUM: Magnesium: 2.9 mg/dL — ABNORMAL HIGH (ref 1.7–2.4)

## 2024-01-23 LAB — HEPATITIS PANEL, ACUTE
HCV Ab: NONREACTIVE
Hep A IgM: NONREACTIVE
Hep B C IgM: NONREACTIVE
Hepatitis B Surface Ag: NONREACTIVE

## 2024-01-23 LAB — TROPONIN T, HIGH SENSITIVITY
Troponin T High Sensitivity: 293 ng/L (ref 0–19)
Troponin T High Sensitivity: 258 ng/L (ref 0–19)

## 2024-01-23 LAB — CBG MONITORING, ED: Glucose-Capillary: 88 mg/dL (ref 70–99)

## 2024-01-23 MED ORDER — ENSURE PLUS HIGH PROTEIN PO LIQD
237.0000 mL | Freq: Two times a day (BID) | ORAL | Status: DC
  Administered 2024-01-24 – 2024-01-27 (×7): 237 mL via ORAL

## 2024-01-23 MED ORDER — LEVETIRACETAM 500 MG PO TABS
500.0000 mg | ORAL_TABLET | Freq: Two times a day (BID) | ORAL | Status: DC
  Administered 2024-01-23 – 2024-01-27 (×8): 500 mg via ORAL
  Filled 2024-01-23 (×9): qty 1

## 2024-01-23 MED ORDER — CLOPIDOGREL BISULFATE 75 MG PO TABS
75.0000 mg | ORAL_TABLET | Freq: Every day | ORAL | Status: DC
  Administered 2024-01-24 – 2024-01-27 (×4): 75 mg via ORAL
  Filled 2024-01-23 (×4): qty 1

## 2024-01-23 MED ORDER — ACETAMINOPHEN 650 MG RE SUPP: 650.0000 mg | Freq: Four times a day (QID) | RECTAL | Status: DC | PRN

## 2024-01-23 MED ORDER — STERILE WATER FOR INJECTION IV SOLN
INTRAVENOUS | Status: DC
  Filled 2024-01-23 (×2): qty 150

## 2024-01-23 MED ORDER — ACETAMINOPHEN 325 MG PO TABS
650.0000 mg | ORAL_TABLET | Freq: Four times a day (QID) | ORAL | Status: DC | PRN
  Administered 2024-01-24 – 2024-01-26 (×3): 650 mg via ORAL
  Filled 2024-01-23 (×3): qty 2

## 2024-01-23 MED ORDER — OXYCODONE HCL 5 MG PO TABS: 5.0000 mg | ORAL_TABLET | ORAL | Status: DC | PRN

## 2024-01-23 MED ORDER — PROCHLORPERAZINE EDISYLATE 10 MG/2ML IJ SOLN: 5.0000 mg | Freq: Four times a day (QID) | INTRAMUSCULAR | Status: DC | PRN

## 2024-01-23 MED ORDER — SODIUM CHLORIDE 0.9 % IV SOLN: Freq: Once | INTRAVENOUS | Status: AC

## 2024-01-23 MED ORDER — LEVOTHYROXINE SODIUM 50 MCG PO TABS
50.0000 ug | ORAL_TABLET | Freq: Every day | ORAL | Status: DC
  Administered 2024-01-24 – 2024-01-27 (×4): 50 ug via ORAL
  Filled 2024-01-23 (×4): qty 1

## 2024-01-23 MED ORDER — SODIUM CHLORIDE 0.9 % IV BOLUS
1000.0000 mL | Freq: Once | INTRAVENOUS | Status: AC
  Administered 2024-01-23: 1000 mL via INTRAVENOUS

## 2024-01-23 MED ORDER — SODIUM CHLORIDE 0.9% FLUSH
3.0000 mL | Freq: Two times a day (BID) | INTRAVENOUS | Status: DC
  Administered 2024-01-23 – 2024-01-27 (×7): 3 mL via INTRAVENOUS

## 2024-01-23 MED ORDER — HEPARIN SODIUM (PORCINE) 5000 UNIT/ML IJ SOLN
5000.0000 [IU] | Freq: Three times a day (TID) | INTRAMUSCULAR | Status: DC
  Administered 2024-01-23 – 2024-01-27 (×11): 5000 [IU] via SUBCUTANEOUS
  Filled 2024-01-23 (×11): qty 1

## 2024-01-23 MED ORDER — MONTELUKAST SODIUM 10 MG PO TABS: 10.0000 mg | ORAL_TABLET | Freq: Every day | ORAL | Status: DC

## 2024-01-23 MED ORDER — SENNA 8.6 MG PO TABS: 1.0000 | ORAL_TABLET | Freq: Every day | ORAL | Status: DC | PRN

## 2024-01-23 NOTE — ED Triage Notes (Signed)
 Recent loss of husband in September,  wt loss since 80lb this year  Feels like legs and weak, losing muscle mass

## 2024-01-23 NOTE — ED Notes (Signed)
 Report attempted to 4E at this time; no answer

## 2024-01-23 NOTE — ED Provider Notes (Signed)
 [place] EMERGENCY DEPARTMENT AT [facility] Provider Note   CSN: [id number] Arrival date & time: 01/23/24  1521     Patient presents with: Weakness   Olivia Walsh is a 63 y.o. female.   Pt is a 63 yo female with pmhx significant for htn, hld, ckd, hypothyroidism, CAD, PAD, hx cerebral aneurysm s/p repair and skin cancer.  Pt has lost 75 lbs over the past year.  She feels very weak.  She lost her husband this past September and was not eating much because he was not eating much.  She feels like her weakness has progressed over the last month and it's been really bad for the past week.  She has no energy to do anything.  She started drinking Ensure on Saturday, 11/22, but it has not helped.  Pt feels generally weak, nothing worse on either side of the body.       Prior to Admission medications   Medication Sig Start Date End Date Taking? Authorizing Provider  clopidogrel  (PLAVIX ) 75 MG tablet TAKE 1 TABLET  DAILY TO  PREVENT BLOOD CLOTS                                                     /                                               TAKE                                 BY                                          MOUTH 04/23/23   Webb, Padonda B, FNP  Evolocumab  (REPATHA  SURECLICK) 140 MG/ML SOAJ Inject 140 mg into the skin every 14 (fourteen) days. 03/16/22   Santo Stanly LABOR, MD  fluticasone  (FLONASE ) 50 MCG/ACT nasal spray Place 2 sprays into both nostrils daily. 04/18/22 12/24/23  Wilkinson, Dana E, NP  furosemide  (LASIX ) 20 MG tablet TAKE 1 TABLET(20 MG) BY MOUTH DAILY AS NEEDED 11/07/23   Chandrasekhar, Mahesh A, MD  levETIRAcetam  (KEPPRA ) 500 MG tablet TAKE 1 TABLET BY MOUTH TWICE DAILY TO PREVENT SEIZURES 09/09/23   de Cuba, Quintin PARAS, MD  Levocetirizine Dihydrochloride (XYZAL PO) Take by mouth.    [provider]  levothyroxine  (SYNTHROID ) 50 MCG tablet Take 1 tablet (50 mcg total) by mouth daily before breakfast. 08/01/23   de Cuba, Quintin PARAS, MD   mavacamten  (CAMZYOS ) 2.5 MG CAPS capsule Take 1 capsule (2.5 mg total) by mouth daily. 06/07/23   Santo Stanly LABOR, MD  metoprolol  succinate (TOPROL -XL) 25 MG 24 hr tablet TAKE 1 TABLET(25 MG) BY MOUTH DAILY 09/09/23   de Cuba, Quintin PARAS, MD  montelukast  (SINGULAIR ) 10 MG tablet Take 1 tablet (10 mg total) by mouth at bedtime. 09/09/23   de Cuba, Quintin PARAS, MD  potassium chloride  (KLOR-CON  M) 10 MEQ tablet Take 1 tablet (10 mEq total) by mouth daily. 12/06/23   de Cuba, Raymond J, MD  rosuvastatin  (CRESTOR ) 40 MG tablet TAKE 1 TABLET BY MOUTH  DAILY FOR CHOLESTEROL 09/09/23   de Cuba, Quintin PARAS, MD  spironolactone  (ALDACTONE ) 25 MG tablet Take 1 tablet (25 mg total) by mouth daily. 08/16/23   de Cuba, Raymond J, MD    Allergies: Ace inhibitors, Penicillins, and Prednisone     Review of Systems  Neurological:  Positive for weakness.  All other systems reviewed and are negative.   Updated Vital Signs BP 116/66   Pulse (!) 51   Temp 98.9 F (37.2 C)   Resp 10   SpO2 96%   Physical Exam Vitals and nursing note reviewed.  Constitutional:      Appearance: She is underweight.  HENT:     Head: Normocephalic and atraumatic.     Right Ear: External ear normal.     Left Ear: External ear normal.     Nose: Nose normal.     Mouth/Throat:     Mouth: Mucous membranes are moist.     Pharynx: Oropharynx is clear.  Eyes:     Extraocular Movements: Extraocular movements intact.     Conjunctiva/sclera: Conjunctivae normal.     Pupils: Pupils are equal, round, and reactive to light.  Cardiovascular:     Rate and Rhythm: Normal rate and regular rhythm.     Pulses: Normal pulses.     Heart sounds: Normal heart sounds.  Pulmonary:     Effort: Pulmonary effort is normal.     Breath sounds: Normal breath sounds.  Abdominal:     General: Abdomen is flat. Bowel sounds are normal.     Palpations: Abdomen is soft.  Musculoskeletal:        General: Normal range of motion.     Cervical back:  Normal range of motion and neck supple.  Skin:    General: Skin is warm.     Capillary Refill: Capillary refill takes less than 2 seconds.  Neurological:     General: No focal deficit present.     Mental Status: She is alert and oriented to person, place, and time.  Psychiatric:        Mood and Affect: Mood normal.        Behavior: Behavior normal.     (all labs ordered are listed, but only abnormal results are displayed) Labs Reviewed  CBC WITH DIFFERENTIAL/PLATELET - Abnormal; Notable for the following components:      Result Value   RBC 3.62 (*)    Hemoglobin 11.1 (*)    HCT 33.4 (*)    All other components within normal limits  COMPREHENSIVE METABOLIC PANEL WITH GFR - Abnormal; Notable for the following components:   Sodium 134 (*)    Chloride 95 (*)    CO2 17 (*)    BUN 61 (*)    Creatinine, Ser 4.07 (*)    AST 270 (*)    ALT 141 (*)    GFR, Estimated 12 (*)    Anion gap 22 (*)    All other components within normal limits  URINALYSIS, ROUTINE W REFLEX MICROSCOPIC - Abnormal; Notable for the following components:   APPearance HAZY (*)    Hgb urine dipstick LARGE (*)    Protein, ur 30 (*)    Leukocytes,Ua MODERATE (*)    Bacteria, UA RARE (*)    All other components within normal limits  MAGNESIUM - Abnormal; Notable for the following components:   Magnesium 2.9 (*)    All other components within normal limits  TROPONIN T, HIGH SENSITIVITY - Abnormal; Notable for the following components:   Troponin T High Sensitivity 293 (*)    All other components within normal limits  HEPATITIS PANEL, ACUTE  CBG MONITORING, ED  TROPONIN T, HIGH SENSITIVITY    EKG: EKG Interpretation Date/Time:  Thursday January 23 2024 15:58:11 EST Ventricular Rate:  53 PR Interval:  188 QRS Duration:  126 QT Interval:  458 QTC Calculation: 430 R Axis:   54  Text Interpretation: Sinus rhythm Ventricular premature complex Left atrial enlargement Left bundle branch block No significant  change since last tracing Confirmed by Dean Clarity [phone]) on 01/23/2024 4:01:15 PM  Radiology: CT CHEST ABDOMEN PELVIS WO CONTRAST Result Date: 01/23/2024 EXAM: CT CHEST, ABDOMEN AND PELVIS WITHOUT CONTRAST 01/23/2024 05:47:58 PM TECHNIQUE: CT of the chest, abdomen and pelvis was performed without the administration of intravenous contrast. Multiplanar reformatted images are provided for review. Automated exposure control, iterative reconstruction, and/or weight based adjustment of the mA/kV was utilized to reduce the radiation dose to as low as reasonably achievable. COMPARISON: Chest radiograph 01/23/2024, chest CT 05/21/2022, abdominal ultrasound 02/14/2022, and MRI abdomen 11/28/2021. CLINICAL HISTORY: Weight loss, unintended. FINDINGS: CHEST: MEDIASTINUM AND LYMPH NODES: Heart and pericardium are unremarkable. Postoperative changes in the mediastinum consistent with coronary bypass. Sternotomy wires. Calcification of the aorta and coronary arteries. No aneurysm. The central airways are clear. No mediastinal, hilar or axillary lymphadenopathy. LUNGS AND PLEURA: Scattered pulmonary nodules bilaterally. The largest is in the right upper lobe, image 25, measuring 2 mm diameter. No change since prior study. No focal consolidation or pulmonary edema. No pleural effusion or pneumothorax. ABDOMEN AND PELVIS: LIVER: The liver is unremarkable. GALLBLADDER AND BILE DUCTS: Gallbladder is unremarkable. No biliary ductal dilatation. SPLEEN: No acute abnormality. PANCREAS: No acute abnormality. ADRENAL GLANDS: No acute abnormality. KIDNEYS, URETERS AND BLADDER: Cyst in the upper pole of the right kidney measuring 7.1 cm in diameter. Internal septation and diffuse mild calcifications are present. No change since prior study. No imaging follow-up is indicated. Subcentimeter hyperdense lesions in the lower pole of the right kidney consistent with hemorrhagic cyst, unchanged. No imaging follow-up is indicated. No stones  in the kidneys or ureters. No hydronephrosis. No perinephric or periureteral stranding. Urinary bladder is unremarkable. GI AND BOWEL: The stomach is decompressed. Contrast material is demonstrated throughout the colon and rectum, suggesting no evidence of obstruction. The small bowel is decompressed. No wall thickening or inflammatory stranding. REPRODUCTIVE ORGANS: The uterus is not enlarged. Left ovarian cyst measuring 4.1 cm diameter. PERITONEUM AND RETROPERITONEUM: No ascites. No free air. VASCULATURE: Extensive calcification of the aorta and iliac arteries without aneurysm. ABDOMINAL AND PELVIS LYMPH NODES: No lymphadenopathy. BONES AND SOFT TISSUES: Sternotomy wires. Degenerative changes in the spine. No focal bone lesions. No focal soft tissue abnormality. IMPRESSION: 1. No acute findings. 2. Stable scattered pulmonary nodules bilaterally, largest 2 mm in the right upper lobe. No imaging follow-up is indicated by size criteria. 3. Stable right upper pole renal cyst with septation and calcifications, and stable subcentimeter hemorrhagic cysts in the lower pole of the right kidney. No imaging follow-up is indicated. 4. Left ovarian simple-appearing cyst measuring 4.1 cm. Recommend pelvic ultrasound in 36 months for follow-up. Electronically signed by: Elsie Gravely MD 01/23/2024 06:01 PM EST RP Workstation: [id number]   CT Head Wo Contrast Result Date: 01/23/2024 EXAM: CT HEAD WITHOUT CONTRAST 01/23/2024 04:17:52 PM TECHNIQUE: CT of the head was performed without the administration of intravenous contrast. Automated exposure control, iterative reconstruction, and/or weight  based adjustment of the mA/kV was utilized to reduce the radiation dose to as low as reasonably achievable. COMPARISON: Head CT 06/12/2010. CLINICAL HISTORY: 63 year old female with mental status change, unknown cause. FINDINGS: BRAIN AND VENTRICLES: No acute hemorrhage. No hyperdense intracranial hemorrhage is identified. No evidence  of acute infarct. No acute cortically based infarct is identified. No hydrocephalus. No ventriculomegaly. Small bilateral low density extra-axial collections over the superior convexities, slightly greater on the right (coronal image 28), measuring up to 3 mm on that side. These are new. No mass effect or midline shift. No acute intracranial mass effect. Chronic right superior convexity craniotomy is stable. Metal embolization coil pack in the posterior fossa with superimposed evidence of distal right vertebral artery stent has been revised since 2012. Superimposed advanced calcified atherosclerosis at the skull base. Abnormal large oval and mildly lobulated rim calcified mass at the central skull base asymmetric to the left (42 x 23 x 22 mm (AP x transverse x CC)). This lesion is inseparable from the ICA siphons and is suspicious for a giant aneurysm. Anterior communicating artery coil pack appears stable. Right anterior superior frontal gyrus encephalomalacia, smaller area of encephalomalacia along the posterior right operculum are stable. ORBITS: No acute abnormality. SINUSES: Visible paranasal sinuses, middle ears and mastoids remain well aerated. SOFT TISSUES AND SKULL: No acute soft tissue abnormality. No skull fracture. Chronic right superior convexity craniotomy is stable. IMPRESSION: 1. Small bilateral low-density Subdural Collections over the superior convexities, up to 3 mm, new from 2012 but with no significant intracranial mass effect. Favor either chronic subdural hematomas or CSF hygromas. 2. Sequelae of right posterior fossa and anterior communicating artery aneurysm embolizations. 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 3. Stable right hemisphere encephalomalacia, previous craniotomy. Electronically signed by: Helayne Hurst MD 01/23/2024 04:40 PM EST RP Workstation: [id number]   DG Chest Portable 1 View Result Date:  01/23/2024 CLINICAL DATA:  Several day history of weakness and fatigue EXAM: PORTABLE CHEST 1 VIEW COMPARISON:  Chest radiograph dated 07/03/2013 FINDINGS: Patient is rotated slightly to the right. Normal lung volumes. No focal consolidations. No pleural effusion or pneumothorax. Similar postsurgical cardiomediastinal silhouette. Median sternotomy wires are nondisplaced. IMPRESSION: No acute disease. Electronically Signed   By: Limin  Xu M.D.   On: 01/23/2024 16:11     Procedures   Medications Ordered in the ED  sodium chloride  0.9 % bolus 1,000 mL (0 mLs Intravenous Stopped 01/23/24 1738)  0.9 %  sodium chloride  infusion ( Intravenous New Bag/Given 01/23/24 1804)                                    Medical Decision Making Amount and/or Complexity of Data Reviewed Labs: ordered. Radiology: ordered.  Risk Prescription drug management.   This patient presents to the ED for concern of generalized weakness, this involves an extensive number of treatment options, and is a complaint that carries with it a high risk of complications and morbidity.  The differential diagnosis includes anemia, electrolyte abn,    Co morbidities that complicate the patient evaluation   htn, hld, ckd, hypothyroidism, CAD, PAD, hx cerebral aneurysm s/p repair and skin cancer   Additional history obtained:  Additional history obtained from epic chart review External records from outside source obtained and reviewed including friend   Lab Tests:  I Ordered, and personally interpreted labs.  The  pertinent results include:  cbc with hgb 11.1 (hgb 13.4 on 1/14); cmp with bun elevated at 61 and cr elevated at 4.07 (bun 31 and cr 2.59 on 1/14); LFTs elevated with AST 270 and ALT 141 (nl on 01/17/23); trop elevated at 293; mg 2.9   Imaging Studies ordered:  I ordered imaging studies including cxr, ct head  I independently visualized and interpreted imaging which showed  CXR: No acute disease.  CT  head: 1. Small bilateral low-density Subdural Collections over the superior  convexities, up to 3 mm, new from 2012 but with no significant intracranial  mass effect. Favor either chronic subdural hematomas or CSF hygromas.  2. Sequelae of right posterior fossa and anterior communicating artery aneurysm  embolizations. And probable chronic rim calcified Giant ICA aneurysm at the  central skull base (42 mm), present in 2012.  3. Stable right hemisphere encephalomalacia, previous craniotomy.  CT abd/pelvis: No acute findings.  2. Stable scattered pulmonary nodules bilaterally, largest 2 mm in the right  upper lobe. No imaging follow-up is indicated by size criteria.  3. Stable right upper pole renal cyst with septation and calcifications, and  stable subcentimeter hemorrhagic cysts in the lower pole of the right kidney.  No imaging follow-up is indicated.  4. Left ovarian simple-appearing cyst measuring 4.1 cm. Recommend pelvic  ultrasound in 36 months for follow-up.   I agree with the radiologist interpretation   Cardiac Monitoring:  The patient was maintained on a cardiac monitor.  I personally viewed and interpreted the cardiac monitored which showed an underlying rhythm of: nsr   Medicines ordered and prescription drug management:  I ordered medication including ivfs  for sx  Reevaluation of the patient after these medicines showed that the patient improved I have reviewed the patients home medicines and have made adjustments as needed   Test Considered:  ct   Critical Interventions:  Ivfs   Consultations Obtained:  I requested consultation with the hospitalist (Dr. ALONSO Blanch),  and discussed lab and imaging findings as well as pertinent plan - he will admit   Problem List / ED Course:  AKI:  likely due to dehydration.  Ivfs given.  Likely due to poor oral intake. Unintentional weight loss:  unclear etiology.   Elevated trop:  no cp.  Will continue to  trend. Ovarian cyst:  pt will need US  to follow Elevated LFTs:  liver looks ok on CT.  Hepatitis panel sent.     Reevaluation:  After the interventions noted above, I reevaluated the patient and found that they have :improved   Social Determinants of Health:  Lives at home   Dispostion:  After consideration of the diagnostic results and the patients response to treatment, I feel that the patent would benefit from admission.       Final diagnoses:  AKI (acute kidney injury)  Dehydration  FTT (failure to thrive) in adult  Elevated troponin  Elevated LFTs  Cyst of left ovary    ED Discharge Orders     None          Dean Clarity, MD 01/23/24 AMOS

## 2024-01-23 NOTE — ED Notes (Signed)
 Attempted report to 4E at this time; asked to call back

## 2024-01-23 NOTE — H&P (Signed)
 History and Physical    Olivia Walsh FMW:[id number] DOB: 07-22-1960 DOA: 01/23/2024  PCP: de [place], Raymond J, MD   Patient coming from: Home   Chief Complaint: General weakness, weight loss   HPI: Olivia Walsh is a 63 y.o. female with medical history significant for hypertension, hyperlipidemia, CAD status post CABG, obstructive hypertrophic cardiomyopathy, history of CVA, hypothyroidism, history of seizures, remote cerebral aneurysm repair, and CKD stage IV who presents with generalized weakness and weight loss.  Patient explains that her husband was not eating much when dying of lung cancer, and she herself also stopped eating normally.  He died in 11/22/23 and she has continued to have poor appetite and decreased intake.  She reports losing 75 to 80 pounds over the past year or so and has become progressively weak in general.  Generalized weakness has accelerated significantly recently, now to the point where she has a lot of difficulty getting up and ambulating unassisted.  She denies any nausea, vomiting, diarrhea, dysuria, fevers, chills, chest pain, or shortness of breath associated with this.  She reports that she has continued to take her medications as directed.  [facility] ED Course: Upon arrival to the ED, patient is found to be afebrile and saturating well on room air with heart rate in the 50s and stable blood pressure.  Labs are most notable for creatinine 4.07, serum bicarbonate 17, anion gap 22, AST 270, ALT 141, normal bilirubin, normal WBC, and troponin 293.  There are no acute findings on chest x-ray.  Head CT notable for chronic SDH versus hygroma.  There are no acute findings on CT of the chest, abdomen, and pelvis though left ovarian cyst is incidentally noted.  Patient was treated with IV fluids in the ED and transferred to [facility] for admission.  Review of Systems:  All other systems reviewed and apart from HPI, are negative.  Past  Medical History:  Diagnosis Date   Aneurysm    CAD (coronary artery disease) 2002   hx CABG 2002, Cath 2011 w/ occluded native vessels but LIMA-LAD, SVG-pLAD, SVG-Diag, SVG-OM patent, SVG-RCA occluded   Cholelithiasis    CKD (chronic kidney disease) stage 3, GFR 30-59 ml/min (HCC)    History of basal cell carcinoma excision 12/20/2017   Hyperlipidemia LDL goal <70    Hypertension    Hypothyroidism    PAD (peripheral artery disease)    Renal cyst    Tobacco abuse    Vitamin D  deficiency     Past Surgical History:  Procedure Laterality Date   CATARACT EXTRACTION, BILATERAL Bilateral 2020   CEREBRAL ANEURYSM REPAIR Right 2012   CEREBRAL ANEURYSM REPAIR Right 2005   CORONARY ARTERY BYPASS GRAFT  2002   Dr Lucas   EMBOLIZATION  2005   Embolization of right PICA aneurysm    Social History:   reports that she has been smoking cigarettes. She started smoking about 50 years ago. She has a 50.9 pack-year smoking history. She has been exposed to tobacco smoke. She has never used smokeless tobacco. She reports that she does not drink alcohol and does not use drugs.  Allergies  Allergen Reactions   Ace Inhibitors Cough   Penicillins Other (See Comments)    happened years ago (reaction not recalled)   Prednisone  Other (See Comments)    Dysphoria    Family History  Problem Relation Age of Onset   Brain cancer Father        brain   Leukemia Sister  50   Heart disease Mother    Heart attack Mother 48   Heart disease Brother    Heart attack Brother 19   Heart attack Maternal Grandfather    Heart disease Brother 24   Multiple sclerosis Sister      Prior to Admission medications   Medication Sig Start Date End Date Taking? Authorizing Provider  clopidogrel  (PLAVIX ) 75 MG tablet TAKE 1 TABLET  DAILY TO  PREVENT BLOOD CLOTS                                                     /                                               TAKE                                 BY                                           MOUTH 04/23/23   Webb, Padonda B, FNP  Evolocumab  (REPATHA  SURECLICK) 140 MG/ML SOAJ Inject 140 mg into the skin every 14 (fourteen) days. 03/16/22   Santo Stanly LABOR, MD  fluticasone  (FLONASE ) 50 MCG/ACT nasal spray Place 2 sprays into both nostrils daily. 04/18/22 12/24/23  Wilkinson, Dana E, NP  furosemide  (LASIX ) 20 MG tablet TAKE 1 TABLET(20 MG) BY MOUTH DAILY AS NEEDED 11/07/23   Chandrasekhar, Mahesh A, MD  levETIRAcetam  (KEPPRA ) 500 MG tablet TAKE 1 TABLET BY MOUTH TWICE DAILY TO PREVENT SEIZURES 09/09/23   de [place], Quintin PARAS, MD  Levocetirizine Dihydrochloride (XYZAL PO) Take by mouth.    [provider]  levothyroxine  (SYNTHROID ) 50 MCG tablet Take 1 tablet (50 mcg total) by mouth daily before breakfast. 08/01/23   de [place], Quintin PARAS, MD  mavacamten  (CAMZYOS ) 2.5 MG CAPS capsule Take 1 capsule (2.5 mg total) by mouth daily. 06/07/23   Santo Stanly LABOR, MD  metoprolol  succinate (TOPROL -XL) 25 MG 24 hr tablet TAKE 1 TABLET(25 MG) BY MOUTH DAILY 09/09/23   de [place], Quintin PARAS, MD  montelukast  (SINGULAIR ) 10 MG tablet Take 1 tablet (10 mg total) by mouth at bedtime. 09/09/23   de [place], Quintin PARAS, MD  potassium chloride  (KLOR-CON  M) 10 MEQ tablet Take 1 tablet (10 mEq total) by mouth daily. 12/06/23   de [place], Raymond J, MD  rosuvastatin  (CRESTOR ) 40 MG tablet TAKE 1 TABLET BY MOUTH  DAILY FOR CHOLESTEROL 09/09/23   de [place], Quintin PARAS, MD  spironolactone  (ALDACTONE ) 25 MG tablet Take 1 tablet (25 mg total) by mouth daily. 08/16/23   de [place], Raymond J, MD    Physical Exam: Vitals:   01/23/24 1533 01/23/24 1841 01/23/24 2050 01/23/24 2054  BP: 108/77 116/66 (!) 145/71   Pulse: (!) 58 (!) 51    Resp: 18 10    Temp: 98.9 F (37.2 C)  97.7 F (36.5 C)   TempSrc:  Oral Oral   SpO2: 100% 96%    Weight:  53.1 kg  Height:    5' 7 (1.702 m)    Constitutional: NAD, no pallor or diaphoresis   Eyes: PERTLA, lids and conjunctivae normal ENMT: Mucous  membranes are moist. Posterior pharynx clear of any exudate or lesions.   Neck: supple, no masses  Respiratory: no wheezing, no crackles. No accessory muscle use.  Cardiovascular: S1 & S2 heard, regular rate and rhythm. No extremity edema.  Abdomen: No tenderness, soft. Bowel sounds active.  Musculoskeletal: no clubbing / cyanosis. No joint deformity upper and lower extremities.   Skin: no significant rashes, lesions, ulcers. Warm, dry, well-perfused. Neurologic: CN 2-12 grossly intact. Moving all extremies. Alert and oriented.  Psychiatric: Calm. Cooperative.    Labs and Imaging on Admission: I have personally reviewed following labs and imaging studies  CBC: Recent Labs  Lab 01/23/24 1609  WBC 7.5  NEUTROABS 5.1  HGB 11.1*  HCT 33.4*  MCV 92.3  PLT 180   Basic Metabolic Panel: Recent Labs  Lab 01/23/24 1609  NA 134*  K 3.9  CL 95*  CO2 17*  GLUCOSE 74  BUN 61*  CREATININE 4.07*  CALCIUM  9.4  MG 2.9*   GFR: Estimated Creatinine Clearance: 11.6 mL/min (A) (by C-G formula based on SCr of 4.07 mg/dL (H)). Liver Function Tests: Recent Labs  Lab 01/23/24 1609  AST 270*  ALT 141*  ALKPHOS 50  BILITOT 0.3  PROT 7.0  ALBUMIN 4.2   No results for input(s): LIPASE, AMYLASE in the last 168 hours. No results for input(s): AMMONIA in the last 168 hours. Coagulation Profile: No results for input(s): INR, PROTIME in the last 168 hours. Cardiac Enzymes: No results for input(s): CKTOTAL, CKMB, CKMBINDEX, TROPONINI in the last 168 hours. BNP (last 3 results) No results for input(s): PROBNP in the last 8760 hours. HbA1C: No results for input(s): HGBA1C in the last 72 hours. CBG: Recent Labs  Lab 01/23/24 1556  GLUCAP 88   Lipid Profile: No results for input(s): CHOL, HDL, LDLCALC, TRIG, CHOLHDL, LDLDIRECT in the last 72 hours. Thyroid Function Tests: No results for input(s): TSH, T4TOTAL, FREET4, T3FREE, THYROIDAB in the  last 72 hours. Anemia Panel: No results for input(s): VITAMINB12, FOLATE, FERRITIN, TIBC, IRON, RETICCTPCT in the last 72 hours. Urine analysis:    Component Value Date/Time   COLORURINE YELLOW 01/23/2024 1745   APPEARANCEUR HAZY (A) 01/23/2024 1745   LABSPEC 1.009 01/23/2024 1745   PHURINE 5.5 01/23/2024 1745   GLUCOSEU NEGATIVE 01/23/2024 1745   HGBUR LARGE (A) 01/23/2024 1745   BILIRUBINUR NEGATIVE 01/23/2024 1745   KETONESUR NEGATIVE 01/23/2024 1745   PROTEINUR 30 (A) 01/23/2024 1745   NITRITE NEGATIVE 01/23/2024 1745   LEUKOCYTESUR MODERATE (A) 01/23/2024 1745   Sepsis Labs: @LABRCNTIP (procalcitonin:4,lacticidven:4) )No results found for this or any previous visit (from the past 240 hours).   Radiological Exams on Admission: CT CHEST ABDOMEN PELVIS WO CONTRAST Result Date: 01/23/2024 EXAM: CT CHEST, ABDOMEN AND PELVIS WITHOUT CONTRAST 01/23/2024 05:47:58 PM TECHNIQUE: CT of the chest, abdomen and pelvis was performed without the administration of intravenous contrast. Multiplanar reformatted images are provided for review. Automated exposure control, iterative reconstruction, and/or weight based adjustment of the mA/kV was utilized to reduce the radiation dose to as low as reasonably achievable. COMPARISON: Chest radiograph 01/23/2024, chest CT 05/21/2022, abdominal ultrasound 02/14/2022, and MRI abdomen 11/28/2021. CLINICAL HISTORY: Weight loss, unintended. FINDINGS: CHEST: MEDIASTINUM AND LYMPH NODES: Heart and pericardium are unremarkable. Postoperative changes in the mediastinum consistent with coronary bypass. Sternotomy wires. Calcification of the  aorta and coronary arteries. No aneurysm. The central airways are clear. No mediastinal, hilar or axillary lymphadenopathy. LUNGS AND PLEURA: Scattered pulmonary nodules bilaterally. The largest is in the right upper lobe, image 25, measuring 2 mm diameter. No change since prior study. No focal consolidation or pulmonary  edema. No pleural effusion or pneumothorax. ABDOMEN AND PELVIS: LIVER: The liver is unremarkable. GALLBLADDER AND BILE DUCTS: Gallbladder is unremarkable. No biliary ductal dilatation. SPLEEN: No acute abnormality. PANCREAS: No acute abnormality. ADRENAL GLANDS: No acute abnormality. KIDNEYS, URETERS AND BLADDER: Cyst in the upper pole of the right kidney measuring 7.1 cm in diameter. Internal septation and diffuse mild calcifications are present. No change since prior study. No imaging follow-up is indicated. Subcentimeter hyperdense lesions in the lower pole of the right kidney consistent with hemorrhagic cyst, unchanged. No imaging follow-up is indicated. No stones in the kidneys or ureters. No hydronephrosis. No perinephric or periureteral stranding. Urinary bladder is unremarkable. GI AND BOWEL: The stomach is decompressed. Contrast material is demonstrated throughout the colon and rectum, suggesting no evidence of obstruction. The small bowel is decompressed. No wall thickening or inflammatory stranding. REPRODUCTIVE ORGANS: The uterus is not enlarged. Left ovarian cyst measuring 4.1 cm diameter. PERITONEUM AND RETROPERITONEUM: No ascites. No free air. VASCULATURE: Extensive calcification of the aorta and iliac arteries without aneurysm. ABDOMINAL AND PELVIS LYMPH NODES: No lymphadenopathy. BONES AND SOFT TISSUES: Sternotomy wires. Degenerative changes in the spine. No focal bone lesions. No focal soft tissue abnormality. IMPRESSION: 1. No acute findings. 2. Stable scattered pulmonary nodules bilaterally, largest 2 mm in the right upper lobe. No imaging follow-up is indicated by size criteria. 3. Stable right upper pole renal cyst with septation and calcifications, and stable subcentimeter hemorrhagic cysts in the lower pole of the right kidney. No imaging follow-up is indicated. 4. Left ovarian simple-appearing cyst measuring 4.1 cm. Recommend pelvic ultrasound in 36 months for follow-up. Electronically  signed by: Elsie Gravely MD 01/23/2024 06:01 PM EST RP Workstation: [id number]   CT Head Wo Contrast Result Date: 01/23/2024 EXAM: CT HEAD WITHOUT CONTRAST 01/23/2024 04:17:52 PM TECHNIQUE: CT of the head was performed without the administration of intravenous contrast. Automated exposure control, iterative reconstruction, and/or weight based adjustment of the mA/kV was utilized to reduce the radiation dose to as low as reasonably achievable. COMPARISON: Head CT 06/12/2010. CLINICAL HISTORY: 63 year old female with mental status change, unknown cause. FINDINGS: BRAIN AND VENTRICLES: No acute hemorrhage. No hyperdense intracranial hemorrhage is identified. No evidence of acute infarct. No acute cortically based infarct is identified. No hydrocephalus. No ventriculomegaly. Small bilateral low density extra-axial collections over the superior convexities, slightly greater on the right (coronal image 28), measuring up to 3 mm on that side. These are new. No mass effect or midline shift. No acute intracranial mass effect. Chronic right superior convexity craniotomy is stable. Metal embolization coil pack in the posterior fossa with superimposed evidence of distal right vertebral artery stent has been revised since 2012. Superimposed advanced calcified atherosclerosis at the skull base. Abnormal large oval and mildly lobulated rim calcified mass at the central skull base asymmetric to the left (42 x 23 x 22 mm (AP x transverse x CC)). This lesion is inseparable from the ICA siphons and is suspicious for a giant aneurysm. Anterior communicating artery coil pack appears stable. Right anterior superior frontal gyrus encephalomalacia, smaller area of encephalomalacia along the posterior right operculum are stable. ORBITS: No acute abnormality. SINUSES: Visible paranasal sinuses, middle ears and mastoids remain well aerated. SOFT TISSUES  AND SKULL: No acute soft tissue abnormality. No skull fracture. Chronic right  superior convexity craniotomy is stable. IMPRESSION: 1. Small bilateral low-density Subdural Collections over the superior convexities, up to 3 mm, new from 2012 but with no significant intracranial mass effect. Favor either chronic subdural hematomas or CSF hygromas. 2. Sequelae of right posterior fossa and anterior communicating artery aneurysm embolizations. 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 3. Stable right hemisphere encephalomalacia, previous craniotomy. Electronically signed by: Helayne Hurst MD 01/23/2024 04:40 PM EST RP Workstation: [id number]   DG Chest Portable 1 View Result Date: 01/23/2024 CLINICAL DATA:  Several day history of weakness and fatigue EXAM: PORTABLE CHEST 1 VIEW COMPARISON:  Chest radiograph dated 07/03/2013 FINDINGS: Patient is rotated slightly to the right. Normal lung volumes. No focal consolidations. No pleural effusion or pneumothorax. Similar postsurgical cardiomediastinal silhouette. Median sternotomy wires are nondisplaced. IMPRESSION: No acute disease. Electronically Signed   By: Limin  Xu M.D.   On: 01/23/2024 16:11    EKG: Independently reviewed. Sinus rhythm, PVC, LBBB.   Assessment/Plan   1. AKI superinmposed on CKD IV; elevated AG metabolic acidosis  - BUN is 61 and SCr 4.07, up from 31 & 2.59 in January 2025  - No hydronephrosis on CT in ED  - Hold diuretics, check urine studies, continue IVF hydration with isotonic bicarbonate, renally-dose medications, and repeat serum chemistry panel in am   2. Elevated troponin; CAD  - Troponin was 293 then 258 in the ED; she has LBBB on EKG    - She denies any recent anginal symptoms  - Update echocardiogram, continue Plavix , hold Crestor  while evaluating elevated transaminases   3. Elevated LFTs  - AST 270 and ALT 141 with normal alk phos, normal bilirubin, and no acute hepatobiliary findings on CT in ED  - Check viral hepatitis panel, hold  Crestor  for now, and trend LFTs   4. Seizures  - Continue Keppra    5. Hypothyroidism   - Continue Synthroid     6. Hx of CVA  - Hold Crestor  as above    7. Hypertension  - Hold scheduled antihypertensives and treat as-needed only for now   8. Hypertrophic cardiomyopathy  - Echo is being updated - Resume cardiology follow-up on discharge   9. Left ovarian cyst  - Noted incidentally on CT in ED   - Outpatient follow-up recommended   10. Generalized weakness, debility  - Likely related to weight loss and renal failure  - Check CK, TSH, B12, and folate, consult PT    DVT prophylaxis: sq heparin    Code Status: Full  Level of Care: Level of care: Telemetry Family Communication: None present   Disposition Plan:  Patient is from: Home  Anticipated d/c is to: TBD Anticipated d/c date is: 01/27/24  Patient currently: Pending improved renal function, echocardiogram, PT evaluation   Consults called: None  Admission status: Inpatient     Evalene GORMAN Sprinkles, MD [facility]  01/23/2024, 8:57 PM

## 2024-01-23 NOTE — Progress Notes (Signed)
 Plan of Care Note for accepted transfer   Patient: Olivia Walsh MRN: [id number]   DOA: 01/23/2024  Facility requesting transfer: [facility] Requesting Provider: Dr. Dean Reason for transfer: AKI Facility course:  Patient is a 63 year old female with history significant for CAD s/p CABG, HCM, CKD stage IV, s/p right cerebral aneurysm repair (2005, 2012), seizure disorder on Keppra , PAD, HTN, HLD, hypothyroidism who presented to the ED with unintentional 75 pound weight loss over the last year, generalized weakness, poor oral intake.  Vitals are stable.  Labs notable for creatinine 4.07 (2.59 on 03/12/2023), AST 270, ALT 141, troponin T 293 > 258.  Patient has not had any chest pain per EDP.  EKG without ischemic changes.  CT head without contrast IMPRESSION: 1. Small bilateral low-density Subdural Collections over the superior convexities, up to 3 mm, new from 2012 but with no significant intracranial mass effect. Favor either chronic subdural hematomas or CSF hygromas. 2. Sequelae of right posterior fossa and anterior communicating artery aneurysm embolizations. 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 3. Stable right hemisphere encephalomalacia, previous craniotomy.  CT chest/abdomen/pelvis without contrast IMPRESSION: 1. No acute findings. 2. Stable scattered pulmonary nodules bilaterally, largest 2 mm in the right upper lobe. No imaging follow-up is indicated by size criteria. 3. Stable right upper pole renal cyst with septation and calcifications, and stable subcentimeter hemorrhagic cysts in the lower pole of the right kidney. No imaging follow-up is indicated. 4. Left ovarian simple-appearing cyst measuring 4.1 cm. Recommend pelvic ultrasound in 36 months for follow-up.  Patient was given 1 L normal saline.  The hospitalist service was consulted for admission.  Plan of care: The patient is accepted  for admission to Telemetry unit, at [facility].  Author: Jorie Blanch, MD 01/23/2024  Check [email or web address] for on-call coverage.  Nursing staff, Please call [facility] Admits & Consults System-Wide number on Amion as soon as patient's arrival, so appropriate admitting provider can evaluate the pt.

## 2024-01-23 NOTE — ED Notes (Signed)
 Report given to Hui on 4E at this time

## 2024-01-24 ENCOUNTER — Inpatient Hospital Stay (HOSPITAL_COMMUNITY)

## 2024-01-24 DIAGNOSIS — R7989 Other specified abnormal findings of blood chemistry: Secondary | ICD-10-CM | POA: Diagnosis not present

## 2024-01-24 DIAGNOSIS — N179 Acute kidney failure, unspecified: Secondary | ICD-10-CM | POA: Diagnosis not present

## 2024-01-24 DIAGNOSIS — N184 Chronic kidney disease, stage 4 (severe): Secondary | ICD-10-CM | POA: Diagnosis not present

## 2024-01-24 DIAGNOSIS — I2510 Atherosclerotic heart disease of native coronary artery without angina pectoris: Secondary | ICD-10-CM | POA: Diagnosis not present

## 2024-01-24 LAB — HEPATITIS PANEL, ACUTE
HCV Ab: NONREACTIVE
Hep A IgM: NONREACTIVE
Hep B C IgM: NONREACTIVE
Hepatitis B Surface Ag: NONREACTIVE

## 2024-01-24 LAB — COMPREHENSIVE METABOLIC PANEL WITH GFR
ALT: 94 U/L — ABNORMAL HIGH (ref 0–44)
AST: 190 U/L — ABNORMAL HIGH (ref 15–41)
Albumin: 2.8 g/dL — ABNORMAL LOW (ref 3.5–5.0)
Alkaline Phosphatase: 33 U/L — ABNORMAL LOW (ref 38–126)
Anion gap: 16 — ABNORMAL HIGH (ref 5–15)
BUN: 57 mg/dL — ABNORMAL HIGH (ref 8–23)
CO2: 23 mmol/L (ref 22–32)
Calcium: 7.9 mg/dL — ABNORMAL LOW (ref 8.9–10.3)
Chloride: 98 mmol/L (ref 98–111)
Creatinine, Ser: 3.55 mg/dL — ABNORMAL HIGH (ref 0.44–1.00)
GFR, Estimated: 14 mL/min — ABNORMAL LOW (ref >60)
Glucose, Bld: 78 mg/dL (ref 70–99)
Potassium: 3 mmol/L — ABNORMAL LOW (ref 3.5–5.1)
Sodium: 137 mmol/L (ref 135–145)
Total Bilirubin: 0.5 mg/dL (ref 0.0–1.2)
Total Protein: 5.5 g/dL — ABNORMAL LOW (ref 6.5–8.1)

## 2024-01-24 LAB — CBC
HCT: 28.3 % — ABNORMAL LOW (ref 36.0–46.0)
Hemoglobin: 9.6 g/dL — ABNORMAL LOW (ref 12.0–15.0)
MCH: 31.2 pg (ref 26.0–34.0)
MCHC: 33.9 g/dL (ref 30.0–36.0)
MCV: 91.9 fL (ref 80.0–100.0)
Platelets: 179 K/uL (ref 150–400)
RBC: 3.08 MIL/uL — ABNORMAL LOW (ref 3.87–5.11)
RDW: 14.9 % (ref 11.5–15.5)
WBC: 6.4 K/uL (ref 4.0–10.5)
nRBC: 0 % (ref 0.0–0.2)

## 2024-01-24 LAB — ECHOCARDIOGRAM COMPLETE
AR max vel: 2.77 cm2
AV Area VTI: 2.13 cm2
AV Area mean vel: 2.26 cm2
AV Mean grad: 19 mmHg
AV Peak grad: 26.5 mmHg
Ao pk vel: 2.58 m/s
Area-P 1/2: 7.74 cm2
Height: 67 in
S' Lateral: 2.3 cm
Weight: 1873.6 [oz_av]

## 2024-01-24 LAB — PROTEIN / CREATININE RATIO, URINE
Creatinine, Urine: 29 mg/dL
Protein Creatinine Ratio: 2.34 mg/mg{creat} — ABNORMAL HIGH (ref 0.00–0.15)
Total Protein, Urine: 68 mg/dL

## 2024-01-24 LAB — HIV ANTIBODY (ROUTINE TESTING W REFLEX): HIV Screen 4th Generation wRfx: NONREACTIVE

## 2024-01-24 LAB — TROPONIN I (HIGH SENSITIVITY): Troponin I (High Sensitivity): 64 ng/L — ABNORMAL HIGH (ref <18)

## 2024-01-24 LAB — VITAMIN B12: Vitamin B-12: 202 pg/mL (ref 180–914)

## 2024-01-24 LAB — PHOSPHORUS: Phosphorus: 5.5 mg/dL — ABNORMAL HIGH (ref 2.5–4.6)

## 2024-01-24 LAB — CK: Total CK: 5691 U/L — ABNORMAL HIGH (ref 38–234)

## 2024-01-24 LAB — TSH: TSH: 8.514 u[IU]/mL — ABNORMAL HIGH (ref 0.350–4.500)

## 2024-01-24 LAB — FOLATE: Folate: 11.4 ng/mL (ref >5.9)

## 2024-01-24 LAB — SODIUM, URINE, RANDOM: Sodium, Ur: 56 mmol/L

## 2024-01-24 LAB — CREATININE, URINE, RANDOM: Creatinine, Urine: 29 mg/dL

## 2024-01-24 MED ORDER — SODIUM CHLORIDE 0.9 % IV SOLN: INTRAVENOUS | Status: AC

## 2024-01-24 MED ORDER — POTASSIUM CHLORIDE CRYS ER 20 MEQ PO TBCR
40.0000 meq | EXTENDED_RELEASE_TABLET | ORAL | Status: AC
  Administered 2024-01-24 (×2): 40 meq via ORAL
  Filled 2024-01-24 (×2): qty 2

## 2024-01-24 MED ORDER — LOPERAMIDE HCL 2 MG PO CAPS
4.0000 mg | ORAL_CAPSULE | ORAL | Status: DC | PRN
  Filled 2024-01-24: qty 2

## 2024-01-24 NOTE — Evaluation (Signed)
 Physical Therapy Evaluation Patient Details Name: Olivia Walsh MRN: [id number] DOB: 03/05/1960 Today's Date: 01/24/2024  History of Present Illness  63 yo F adm 01/23/24 with weakness, weight loss, AKI on CKD. PMhx: HTN, HLD, CAD s/p CABG, CM, CVA, Sz, cerebral aneurysm repair, CKD.  Clinical Impression  Pt pleasant and report progressive weight loss and weakness. Pt with acute proximal weakness of bil hips with pt stating left foot drop started without cause a few years ago. Pt lives alone and has been struggling to get up from lower surface and to walk. Pt will benefit from acute therapy to maximize mobility, safety, strength and function to decrease burden of care. Pt believes her sister can stay with her or go to sister's house at D/C initially to have assist for short term. Encouraged OOB to chair for meals, assist for transfer and continued gait trials with RW. HHPT with assist and RW recommended.       If plan is discharge home, recommend the following: A little help with walking and/or transfers;A little help with bathing/dressing/bathroom;Assistance with cooking/housework;Help with stairs or ramp for entrance   Can travel by private vehicle        Equipment Recommendations Rolling walker (2 wheels)  Recommendations for Other Services  OT consult    Functional Status Assessment Patient has had a recent decline in their functional status and demonstrates the ability to make significant improvements in function in a reasonable and predictable amount of time.     Precautions / Restrictions Precautions Precautions: Fall Recall of Precautions/Restrictions: Intact Precaution/Restrictions Comments: lt foot drop      Mobility  Bed Mobility Overal bed mobility: Modified Independent             General bed mobility comments: HOb 30 degrees with pt able to pivot to EOB without physical assist    Transfers Overall transfer level: Needs assistance   Transfers: Sit  to/from Stand Sit to Stand: Min assist, Contact guard assist           General transfer comment: from slightly elevated bed pt able to stand with use of UB, from recliner pt required min assist even with use of UB (3 trials)    Ambulation/Gait Ambulation/Gait assistance: Contact guard assist Gait Distance (Feet): 150 Feet Assistive device: Rolling walker (2 wheels) Gait Pattern/deviations: Step-through pattern, Decreased stride length   Gait velocity interpretation: 1.31 - 2.62 ft/sec, indicative of limited community ambulator   General Gait Details: pt initially walked 10' without UB support but stated feeling very weak and shaky, improved stability and gait with RW, limited by fatigue  Stairs            Wheelchair Mobility     Tilt Bed    Modified Rankin (Stroke Patients Only)       Balance Overall balance assessment: Needs assistance Sitting-balance support: No upper extremity supported Sitting balance-Leahy Scale: Fair     Standing balance support: Bilateral upper extremity supported, No upper extremity supported, During functional activity Standing balance-Leahy Scale: Good Standing balance comment: can static stand without support, Rw for gait beneficial                             Pertinent Vitals/Pain Pain Assessment Pain Assessment: No/denies pain    Home Living Family/patient expects to be discharged to:: Private residence Living Arrangements: Alone Available Help at Discharge: Family;Available PRN/intermittently Type of Home: House Home Access: Stairs to enter  Entrance Stairs-Number of Steps: 3   Home Layout: One level Home Equipment: BSC/3in1;Cane - single point Additional Comments: lives alone but thinks she could stay with sister    Prior Function Prior Level of Function : Independent/Modified Independent;Driving                     Extremity/Trunk Assessment   Upper Extremity Assessment Upper Extremity  Assessment: Generalized weakness    Lower Extremity Assessment Lower Extremity Assessment: RLE deficits/detail;LLE deficits/detail RLE Deficits / Details: hip flexion 2/5, knee flexion/extension 4/5 LLE Deficits / Details: hip flexion 2/5, knee flexion/extension 4/5, dorsiflexion 2/5    Cervical / Trunk Assessment Cervical / Trunk Assessment: Normal  Communication   Communication Communication: No apparent difficulties    Cognition Arousal: Alert Behavior During Therapy: WFL for tasks assessed/performed   PT - Cognitive impairments: No apparent impairments                         Following commands: Intact       Cueing Cueing Techniques: Verbal cues     General Comments      Exercises     Assessment/Plan    PT Assessment Patient needs continued PT services  PT Problem List Decreased strength;Decreased activity tolerance;Decreased balance;Decreased mobility;Decreased knowledge of use of DME       PT Treatment Interventions DME instruction;Balance training;Gait training;Functional mobility training;Therapeutic activities;Therapeutic exercise;Stair training;Patient/family education    PT Goals (Current goals can be found in the Care Plan section)  Acute Rehab PT Goals Patient Stated Goal: return home PT Goal Formulation: With patient Time For Goal Achievement: 02/07/24 Potential to Achieve Goals: Good    Frequency Min 2X/week     Co-evaluation               AM-PAC PT 6 Clicks Mobility  Outcome Measure Help needed turning from your back to your side while in a flat bed without using bedrails?: None Help needed moving from lying on your back to sitting on the side of a flat bed without using bedrails?: None Help needed moving to and from a bed to a chair (including a wheelchair)?: A Little Help needed standing up from a chair using your arms (e.g., wheelchair or bedside chair)?: A Little Help needed to walk in hospital room?: A Little Help  needed climbing 3-5 steps with a railing? : A Lot 6 Click Score: 19    End of Session Equipment Utilized During Treatment: Gait belt Activity Tolerance: Patient tolerated treatment well Patient left: in chair;with call bell/phone within reach;with chair alarm set Nurse Communication: Mobility status PT Visit Diagnosis: Other abnormalities of gait and mobility (R26.89);Difficulty in walking, not elsewhere classified (R26.2);Muscle weakness (generalized) (M62.81)    Time: 8880-8853 PT Time Calculation (min) (ACUTE ONLY): 27 min   Charges:   PT Evaluation $PT Eval Moderate Complexity: 1 Mod PT Treatments $Gait Training: 8-22 mins PT General Charges $$ ACUTE PT VISIT: 1 Visit         Lenoard SQUIBB, PT Acute Rehabilitation Services Office: [phone]   Lenoard NOVAK Shanay Woolman 01/24/2024, 12:23 PM

## 2024-01-24 NOTE — Progress Notes (Signed)
 PROGRESS NOTE Olivia Walsh  FMW:[id number] DOB: [birth year] DOA: 01/23/2024 PCP: de [place], Raymond J, MD  Brief Narrative/Hospital Course: Olivia Walsh is a 63 y.o. female with PMH of  hypertension, hyperlipidemia, CAD status post CABG, obstructive  HCM, history of CVA, hypothyroidism, history of seizures, remote cerebral aneurysm repair, and CKD stage IV who presents with generalized weakness and weight loss. Patient explains that her husband was not eating much when dying of lung cancer, and she herself also stopped eating normally.  He died in 26-Nov-2023 and she has continued to have poor appetite and decreased intake. She reports losing 75 to 80 pounds over the past year or so and has become progressively weak in general.  Generalized weakness has accelerated significantly recently, now to the point where she has a lot of difficulty getting up and ambulating unassisted. At [facility] ED: patient was found to be afebrile and saturating well on room air with heart rate in the 50s and stable blood pressure.  Labs are most notable for creatinine 4.07, serum bicarbonate 17, anion gap 22, AST 270, ALT 141, normal bilirubin, normal WBC, and troponin 293. Chest x-ray: No acute finding Head CT notable for chronic SDH versus hygroma. CT of the chest, abdomen, and pelvis: No acute finding though left ovarian cyst is incidentally noted.    Subjective: Seen and examined today Feels well no new complaints Smelling cigaratte, refused nicotine patch Overnight afebrile, VSS, Labs BMP w/ creat down to 3.5 k 3, troponin 293> 258  Assessment and plan:  AKI on on CKD IV Elevated AG metabolic acidosis:  Baseline BUN/Cr  ~31/2.59 in Jan 2025, on admission 61/4.0 CT without hydronephrosis, patient on diuretics, holding for now follow-up urine studies continue IV hydration Pending further labs bmp w. Creat improving- cont ivf NS, stop bicarb drip avoid nephrotoxic medication renally dose meds Recent  Labs    03/12/23 1635 01/23/24 1609 01/24/24 0648  BUN 31* 61* 57*  CREATININE 2.59* 4.07* 3.55*  CO2 28 17* 23  K 5.1 3.9 3.0*   No intake or output data in the 24 hours ending 01/24/24 0945   Elevated troponin CAD HX: Troponin was 293> 25>64 in the ED; she has LBBB on EKG-but no chest pain.   Recheck EKG, follow-up echocardiogram and cardio recs. Continue Plavix  holding statin due to elevated LFTs   Elevated LFTs:  AST 270 and ALT 141 with normal alk phos, normal bilirubin, and no acute hepatobiliary findings on CT in ED  Liver gallbladder unremarkable on CT scan. viral hepatitis panel NEG, Hold Crestor  for now, and trend LFTs- is downtrending, Recent Labs  Lab 01/23/24 1609 01/24/24 0648  AST 270* 190*  ALT 141* 94*  ALKPHOS 50 33*  BILITOT 0.3 0.5  PROT 7.0 5.5*  ALBUMIN 4.2 2.8*  PLT 180 179   Mild anemia: check anemia panel in AM.  Seizures  Continue Keppra     Hypothyroidism   Continue Synthroid     Hx of CVA  Hold Crestor  as above     Hypertension  Hold scheduled antihypertensives and treat as-needed only for now    Hypertrophic cardiomyopathy  Fu Echo    Left ovarian cyst   Noted incidentally on CT in ED, follow-up outpatient   Generalized weakness, debility  Failure to thrive weight loss Loss of appetite Severe malnutrition-Body mass index is 18.34 kg/m.  Likely related to weight loss and renal failure  Pending CK, TSH, B12, and folate, consult PT , RD   Mobility: PT Orders:  Active PT Follow up Rec:    DVT prophylaxis: heparin  injection 5,000 Units Start: 01/23/24 2200 Code Status:   Code Status: Full Code Family Communication: plan of care discussed with patient at bedside. Patient status is: Remains hospitalized because of severity of illness Level of care: Telemetry   Dispo: The patient is from: home            Anticipated disposition: TBD Objective: Vitals last 24 hrs: Vitals:   01/23/24 2054 01/24/24 0047 01/24/24 0639  01/24/24 0850  BP:  129/62 133/78 (!) 105/57  Pulse:  (!) 47 61 97  Resp:  18 11 (!) 23  Temp:  (!) 97.5 F (36.4 C) 97.6 F (36.4 C) 97.7 F (36.5 C)  TempSrc:  Oral Oral Oral  SpO2:  100% 100% 100%  Weight: 53.1 kg     Height: 5' 7 (1.702 m)       Physical Examination: General exam: alert awake, oriented, thins frail HEENT:Oral mucosa moist, Ear/Nose WNL grossly Respiratory system: Bilaterally clear BS,no use of accessory muscle Cardiovascular system: S1 & S2 +, No JVD. Gastrointestinal system: Abdomen soft,NT,ND, BS+ Nervous System: Alert, awake, moving all extremities,and following commands. Extremities: extremities warm, leg edema neg Skin: Warm, no rashes MSK: Normal muscle bulk,tone, power   Medications reviewed:  Scheduled Meds:  clopidogrel   75 mg Oral Daily   feeding supplement  237 mL Oral BID BM   heparin   5,000 Units Subcutaneous Q8H   levETIRAcetam   500 mg Oral BID   levothyroxine   50 mcg Oral QAC breakfast   sodium chloride  flush  3 mL Intravenous Q12H   Continuous Infusions:  sodium chloride      Diet: Diet Order             Diet regular Room service appropriate? Yes; Fluid consistency: Thin  Diet effective now                    Data Reviewed: I have personally reviewed following labs and imaging studies ( see epic result tab) CBC: Recent Labs  Lab 01/23/24 1609 01/24/24 0648  WBC 7.5 6.4  NEUTROABS 5.1  --   HGB 11.1* 9.6*  HCT 33.4* 28.3*  MCV 92.3 91.9  PLT 180 179   CMP: Recent Labs  Lab 01/23/24 1609 01/24/24 0648  NA 134* 137  K 3.9 3.0*  CL 95* 98  CO2 17* 23  GLUCOSE 74 78  BUN 61* 57*  CREATININE 4.07* 3.55*  CALCIUM  9.4 7.9*  MG 2.9*  --   PHOS  --  5.5*   GFR: Estimated Creatinine Clearance: 13.2 mL/min (A) (by C-G formula based on SCr of 3.55 mg/dL (H)). Recent Labs  Lab 01/23/24 1609 01/24/24 0648  AST 270* 190*  ALT 141* 94*  ALKPHOS 50 33*  BILITOT 0.3 0.5  PROT 7.0 5.5*  ALBUMIN 4.2 2.8*   No  results for input(s): LIPASE, AMYLASE in the last 168 hours. No results for input(s): AMMONIA in the last 168 hours. Coagulation Profile: No results for input(s): INR, PROTIME in the last 168 hours. Unresulted Labs (From admission, onward)     Start     Ordered   01/25/24 0500  Vitamin B12  (Anemia Panel (PNL))  Tomorrow morning,   R        01/24/24 0734   01/25/24 0500  Folate  (Anemia Panel (PNL))  Tomorrow morning,   R        01/24/24 0734   01/25/24 0500  Iron  and TIBC  (Anemia Panel (PNL))  Tomorrow morning,   R        01/24/24 0734   01/25/24 0500  Ferritin  (Anemia Panel (PNL))  Tomorrow morning,   R        01/24/24 0734   01/25/24 0500  Reticulocytes  (Anemia Panel (PNL))  Tomorrow morning,   R        01/24/24 0734   01/24/24 0500  Comprehensive metabolic panel  Daily,   R      01/23/24 2056   01/24/24 0500  CBC  Daily,   R      01/23/24 2056   01/24/24 0500  Hepatitis panel, acute  Tomorrow morning,   R        01/23/24 2056   01/23/24 2059  Urea nitrogen, urine  Add-on,   AD        01/23/24 2058           Antimicrobials/Microbiology: Anti-infectives (From admission, onward)    None         Component Value Date/Time   SDES  01/31/2021 0103    BLOOD LEFT WRIST Performed at [facility], [address], [place], [place] [address]    SDES  01/31/2021 0103    BLOOD RIGHT HAND Performed at [facility], [address] [place], [place] [address]    SPECREQUEST  01/31/2021 0103    BOTTLES DRAWN AEROBIC ONLY Blood Culture adequate volume Performed at [facility], [address], [place], [place] [address]    SPECREQUEST  01/31/2021 0103    BOTTLES DRAWN AEROBIC ONLY Blood Culture adequate volume Performed at [facility], [address], [place], [place] [address]    CULT  01/31/2021 0103    NO GROWTH 5 DAYS Performed at [facility], [address] [place], [place] [address]    CULT   01/31/2021 0103    NO GROWTH 5 DAYS Performed at [facility], [address], [place], [place] [address]    REPTSTATUS 02/05/2021 FINAL 01/31/2021 0103   REPTSTATUS 02/05/2021 FINAL 01/31/2021 0103    Procedures:  Mennie LAMY, MD [facility] 01/24/2024, 10:40 AM

## 2024-01-24 NOTE — Plan of Care (Signed)
   Problem: Activity: Goal: Risk for activity intolerance will decrease Outcome: Progressing

## 2024-01-24 NOTE — Plan of Care (Signed)

## 2024-01-24 NOTE — Progress Notes (Signed)
 Initial Nutrition Assessment  DOCUMENTATION CODES:   Severe malnutrition in context of chronic illness  INTERVENTION:  - Regular diet.  - Ensure Plus High Protein po BID, each supplement provides 350 kcal and 20 grams of protein. - Encourage intake at all meals and of supplements.  - Monitor weight trends.  NUTRITION DIAGNOSIS:   Severe Malnutrition related to chronic illness as evidenced by severe fat depletion, severe muscle depletion  GOAL:   Patient will meet greater than or equal to 90% of their needs  MONITOR:   PO intake, Supplement acceptance, Weight trends, I & O's  REASON FOR ASSESSMENT:   Consult Assessment of nutrition requirement/status  ASSESSMENT:   63 y.o. female with PMH of HTN, HLD, CAD s/p CABG, obstructive HCM, history of CVA, hypothyroidism, history of seizures, remote cerebral aneurysm repair, and CKD stage IV who presented with generalized weakness and weight loss. Admitted for AKI on CKD 4.  Patient reports a UBW of 180# and weight loss over the past 2 years of ~70# due to her husband getting diagnosed with cancer and both her and began eating a lot less as a result.  Per EMR, patient has not been weighed at 180# since 2020.  Over the past year, patient was weighed at 135# in January and now weighed at 117#. This is a 18# or 13% weight loss in 10.5 months, which is not significant for the time frame.  Patient reports that over the past several months to year she has only eating small snacks throughout the day.  However, she decided she needed to eat more to feel better so recently she has been making herself eat a breakfast and dinner and has also been drinking Ensure at home. She also notes she has never been a big water  drinker but she is trying to improve this.  Since admission, patient endorses eating well. Had 1 and 1/2 pancakes, bacon, and coffee for breakfast this morning and had a grilled cheese and a couple fries for lunch. She is ordered  Ensure twice daily and agreeable to consume during admission.   Medications reviewed and include: -  Labs reviewed:  K+ 3.0 Creatinine 3.55 Phosphorus 5.5   NUTRITION - FOCUSED PHYSICAL EXAM:  Flowsheet Row Most Recent Value  Orbital Region Severe depletion  Upper Arm Region Severe depletion  Thoracic and Lumbar Region Moderate depletion  Buccal Region Moderate depletion  Temple Region Severe depletion  Clavicle Bone Region Severe depletion  Clavicle and Acromion Bone Region Severe depletion  Scapular Bone Region Unable to assess  Dorsal Hand Mild depletion  Patellar Region Moderate depletion  Anterior Thigh Region Moderate depletion  Posterior Calf Region Moderate depletion  Edema (RD Assessment) None  Hair Reviewed  Eyes Reviewed  Mouth Reviewed  Skin Reviewed  Nails Reviewed    Diet Order:   Diet Order             Diet regular Room service appropriate? Yes; Fluid consistency: Thin  Diet effective now                   EDUCATION NEEDS:  Education needs have been addressed  Skin:  Skin Assessment: Reviewed RN Assessment  Last BM:  11/28  Height:  Ht Readings from Last 1 Encounters:  01/23/24 5' 7 (1.702 m)   Weight:  Wt Readings from Last 1 Encounters:  01/23/24 53.1 kg    BMI:  Body mass index is 18.34 kg/m.  Estimated Nutritional Needs:  Kcal:  1700-1850 kcals  Protein:  70-80 grams Fluid:  >/= 1.7L    Trude Ned RD, LDN Contact via Secure Chat.

## 2024-01-24 NOTE — Hospital Course (Addendum)
 Olivia Walsh is a 63 y.o. female with PMH of  hypertension, hyperlipidemia, CAD status post CABG, obstructive  HCM, history of CVA, hypothyroidism, history of seizures, remote cerebral aneurysm repair, and CKD stage IV who presents with generalized weakness and weight loss. Patient explains that her husband was not eating much when dying of lung cancer, and she herself also stopped eating normally.  He died in 2023-12-01 and she has continued to have poor appetite and decreased intake. She reports losing 75 to 80 pounds over the past year or so and has become progressively weak in general.  Generalized weakness has accelerated significantly recently, now to the point where she has a lot of difficulty getting up and ambulating unassisted. At [facility] ED: patient was found to be afebrile and saturating well on room air with heart rate in the 50s and stable blood pressure.  Labs are most notable for creatinine 4.07, serum bicarbonate 17, anion gap 22, AST 270, ALT 141, normal bilirubin, normal WBC, and troponin 293. Chest x-ray: No acute finding Head CT notable for chronic SDH versus hygroma. CT of the chest, abdomen, and pelvis: No acute finding though left ovarian cyst is incidentally noted.    Subjective: Seen and examined Feels well no chest pain Overnight afebrile BP stable Labs shows renal function continues to improve 3.1 mild hyperkalemia AST ALT overall stable/elevated  Assessment and plan:  AKI on on CKD IV Elevated AG metabolic acidosis Hypokalemia:  Baseline BUN/Cr  ~31/2.59 in Jan 2025, on admission 61/4.0 CT without hydronephrosis, patient on diuretics, holding for now, encourage oral intake, on gentle IVF and creatinine nicely improving.avoid nephrotoxic medication renally dose meds, place electrolytes Recent Labs    03/12/23 1635 01/23/24 1609 01/24/24 0648 01/25/24 0329  BUN 31* 61* 57* 50*  CREATININE 2.59* 4.07* 3.55* 3.18*  CO2 28 17* 23 23  K 5.1 3.9 3.0* 3.2*     Intake/Output Summary (Last 24 hours) at 01/25/2024 1143 Last data filed at 01/25/2024 0327 Gross per 24 hour  Intake 1636.67 ml  Output --  Net 1636.67 ml    Elevated troponin CAD HX s/p CABG IN 2000 Hypertrophic cardiomyopathy hx: Troponin was 293> 25>64 she has LBBB on EKG-but no chest pain.   Repeat EKG sinus bradycardia, ST and T wave abnormality in the inferolateral lead, nonspecific intraventricular conduction delay. no LBBB- she denies any chest pain DOE sypmtoms anytime- Echo shows EF 65 to 70% no dynamic LVOT obstruction, no RWMA moderate asymmetric left ventricular hypertrophy of the basal segment RV systolic function normal size with normal She sees Dr Haywood repeat EKG if any ischemic changes will discuss with cardiology vs outpatient follow up. Spoke w/ Dr Kennyth- and reviewed- suspect pt had worsening of LVOT ( w/ her HCM) due ot volume depletion and contritnutign with hypoerfusion demand ischemia. He will let Dr Santo know and no need for further inpatient work up  to cont ivf. Continue home Plavix , statin due to elevated LFTs   Elevated LFTs:  AST 270 and ALT 141 with normal alk phos, normal TB.liver gallbladder unremarkable on CT scan.  ACUTE viral hepatitis panel NEG, does have mild rhabdomyolysis which could be etiology of transaminitis.   Hold Crestor  for now, and trend LFTs-slightly downtrending.TREND. Recent Labs  Lab 01/23/24 1609 01/24/24 0648 01/25/24 0329  AST 270* 190* 186*  ALT 141* 94* 95*  ALKPHOS 50 33* 49  BILITOT 0.3 0.5 0.4  PROT 7.0 5.5* 5.5*  ALBUMIN 4.2 2.8* 2.8*  PLT 180 179  167   Non traumatic Rhabdomyolysis: CK5 699, trend CK continue IVF.  Holding statin Recent Labs  Lab 01/24/24 0648  CKTOTAL 5,691*    Mild anemia: Anemia panel with B12 on lower side 183 and folate normal iron ok.  Add B12 replacement  Seizures hx: Continue Keppra     Hypothyroidism:  TSH slightly up to 8.5 check free T4, continue Synthroid      Hx of CVA:  Hold Crestor  as above     Hypertension:  BP remains stable continue to hold antihypertensives and treat  w/ PRNs   Left ovarian cyst   Noted incidentally on CT in ED, follow-up outpatient   Generalized weakness, debility  Failure to thrive weight loss Loss of appetite Severe malnutrition-Body mass index is 18.34 kg/m.  Likely related to weight loss and renal failure.  Augment diet dietitian on board.  Continue PT OT  Mobility: PT Orders: Active PT Follow up Rec: Home Health Pt11/28/2025 1221   DVT prophylaxis: heparin  injection 5,000 Units Start: 01/23/24 2200 Code Status:   Code Status: Full Code Family Communication: plan of care discussed with patient at bedside. Patient status is: Remains hospitalized because of severity of illness Level of care: Telemetry   Dispo: The patient is from: home            Anticipated disposition: TBD Objective: Vitals last 24 hrs: Vitals:   01/24/24 2019 01/25/24 0003 01/25/24 0317 01/25/24 0852  BP: 138/68 137/77 (!) 152/74 121/67  Pulse: (!) 56 (!) 59 64 62  Resp: 15 20 14 16   Temp: 98.1 F (36.7 C) 97.7 F (36.5 C) 97.7 F (36.5 C) (!) 97.5 F (36.4 C)  TempSrc: Oral Oral Oral Oral  SpO2: 100% 96% 99% 100%  Weight:      Height:        Physical Examination: General exam: alert awake thin HEENT:Oral mucosa moist, Ear/Nose WNL grossly Respiratory system: CTA Bilaterally  Cardiovascular system: S1 & S2 +, No JVD. Gastrointestinal system: Abdomen soft,NT,ND, BS+ Nervous System: Alert, awake, moving all extremities,and following commands. Extremities: extremities warm, leg edema neg Skin: Warm, no rashes MSK: Normal muscle bulk,tone, power   Medications reviewed:  Scheduled Meds:  clopidogrel   75 mg Oral Daily   vitamin B-12  500 mcg Oral Daily   feeding supplement  237 mL Oral BID BM   heparin   5,000 Units Subcutaneous Q8H   levETIRAcetam   500 mg Oral BID   levothyroxine   50 mcg Oral QAC breakfast    sodium chloride  flush  3 mL Intravenous Q12H   Continuous Infusions:  sodium chloride  75 mL/hr at 01/25/24 0940   Diet: Diet Order             Diet regular Room service appropriate? Yes; Fluid consistency: Thin  Diet effective now

## 2024-01-25 DIAGNOSIS — N184 Chronic kidney disease, stage 4 (severe): Secondary | ICD-10-CM | POA: Diagnosis not present

## 2024-01-25 DIAGNOSIS — N179 Acute kidney failure, unspecified: Secondary | ICD-10-CM | POA: Diagnosis not present

## 2024-01-25 DIAGNOSIS — E43 Unspecified severe protein-calorie malnutrition: Secondary | ICD-10-CM | POA: Insufficient documentation

## 2024-01-25 LAB — FOLATE: Folate: 7.9 ng/mL (ref >5.9)

## 2024-01-25 LAB — COMPREHENSIVE METABOLIC PANEL WITH GFR
ALT: 95 U/L — ABNORMAL HIGH (ref 0–44)
AST: 186 U/L — ABNORMAL HIGH (ref 15–41)
Albumin: 2.8 g/dL — ABNORMAL LOW (ref 3.5–5.0)
Alkaline Phosphatase: 49 U/L (ref 38–126)
Anion gap: 12 (ref 5–15)
BUN: 50 mg/dL — ABNORMAL HIGH (ref 8–23)
CO2: 23 mmol/L (ref 22–32)
Calcium: 7.8 mg/dL — ABNORMAL LOW (ref 8.9–10.3)
Chloride: 104 mmol/L (ref 98–111)
Creatinine, Ser: 3.18 mg/dL — ABNORMAL HIGH (ref 0.44–1.00)
GFR, Estimated: 16 mL/min — ABNORMAL LOW (ref >60)
Glucose, Bld: 88 mg/dL (ref 70–99)
Potassium: 3.2 mmol/L — ABNORMAL LOW (ref 3.5–5.1)
Sodium: 139 mmol/L (ref 135–145)
Total Bilirubin: 0.4 mg/dL (ref 0.0–1.2)
Total Protein: 5.5 g/dL — ABNORMAL LOW (ref 6.5–8.1)

## 2024-01-25 LAB — RETICULOCYTES
Immature Retic Fract: 23.1 % — ABNORMAL HIGH (ref 2.3–15.9)
RBC.: 2.97 MIL/uL — ABNORMAL LOW (ref 3.87–5.11)
Retic Count, Absolute: 32.1 K/uL (ref 19.0–186.0)
Retic Ct Pct: 1.1 % (ref 0.4–3.1)

## 2024-01-25 LAB — FERRITIN: Ferritin: 382 ng/mL — ABNORMAL HIGH (ref 11–307)

## 2024-01-25 LAB — CBC
HCT: 27.3 % — ABNORMAL LOW (ref 36.0–46.0)
Hemoglobin: 9 g/dL — ABNORMAL LOW (ref 12.0–15.0)
MCH: 30.5 pg (ref 26.0–34.0)
MCHC: 33 g/dL (ref 30.0–36.0)
MCV: 92.5 fL (ref 80.0–100.0)
Platelets: 167 K/uL (ref 150–400)
RBC: 2.95 MIL/uL — ABNORMAL LOW (ref 3.87–5.11)
RDW: 15.1 % (ref 11.5–15.5)
WBC: 6.2 K/uL (ref 4.0–10.5)
nRBC: 0 % (ref 0.0–0.2)

## 2024-01-25 LAB — IRON AND TIBC
Iron: 110 ug/dL (ref 28–170)
Saturation Ratios: 52 % — ABNORMAL HIGH (ref 10.4–31.8)
TIBC: 213 ug/dL — ABNORMAL LOW (ref 250–450)
UIBC: 103 ug/dL

## 2024-01-25 LAB — CK: Total CK: 5368 U/L — ABNORMAL HIGH (ref 38–234)

## 2024-01-25 LAB — VITAMIN B12: Vitamin B-12: 183 pg/mL (ref 180–914)

## 2024-01-25 LAB — UREA NITROGEN, URINE: Urea Nitrogen, Ur: 260 mg/dL

## 2024-01-25 MED ORDER — POTASSIUM CHLORIDE CRYS ER 20 MEQ PO TBCR
40.0000 meq | EXTENDED_RELEASE_TABLET | ORAL | Status: AC
  Administered 2024-01-25 (×2): 40 meq via ORAL
  Filled 2024-01-25 (×2): qty 2

## 2024-01-25 MED ORDER — VITAMIN B-12 1000 MCG PO TABS
500.0000 ug | ORAL_TABLET | Freq: Every day | ORAL | Status: DC
  Administered 2024-01-25 – 2024-01-27 (×3): 500 ug via ORAL
  Filled 2024-01-25 (×3): qty 1

## 2024-01-25 NOTE — Evaluation (Signed)
 Occupational Therapy Evaluation Patient Details Name: Olivia Walsh MRN: [id number] DOB: 10-03-1960 Today's Date: 01/25/2024   History of Present Illness   63 yo F adm 01/23/24 with weakness, weight loss, AKI on CKD. PMhx: HTN, HLD, CAD s/p CABG, CM, CVA, Sz, cerebral aneurysm repair, CKD.     Clinical Impressions Pt admitted based on above, and was seen based on problem list below. PTA pt was independent with ADLs and IADLs. Today pt is requiring set up  to CGA for ADLs. Bed mobility and functional transfers are  min  assist with use of RW. Pt limited by decreased strength and activity tolerance. Recommending pt d/c with HHOT. OT will continue to follow acutely to maximize functional independence.      If plan is discharge home, recommend the following:   A little help with walking and/or transfers;A little help with bathing/dressing/bathroom;Assistance with cooking/housework;Assist for transportation     Functional Status Assessment   Patient has had a recent decline in their functional status and demonstrates the ability to make significant improvements in function in a reasonable and predictable amount of time.     Equipment Recommendations   None recommended by OT      Precautions/Restrictions   Precautions Precautions: Fall Recall of Precautions/Restrictions: Intact Precaution/Restrictions Comments: lt foot drop Restrictions Weight Bearing Restrictions Per Provider Order: No     Mobility Bed Mobility Overal bed mobility: Needs Assistance Bed Mobility: Supine to Sit, Sit to Supine     Supine to sit: Modified independent (Device/Increase time) Sit to supine: Min assist, HOB elevated   General bed mobility comments: HOB elevated, mod I for supine to sit, assist to return legs to bed    Transfers Overall transfer level: Needs assistance Equipment used: Rolling walker (2 wheels) Transfers: Sit to/from Stand Sit to Stand: Min assist            General transfer comment: Min assist to come to stand, once in standing CGA for mobility      Balance Overall balance assessment: Needs assistance Sitting-balance support: No upper extremity supported Sitting balance-Leahy Scale: Fair     Standing balance support: Bilateral upper extremity supported, No upper extremity supported, During functional activity Standing balance-Leahy Scale: Good Standing balance comment: benefits from RW         ADL either performed or assessed with clinical judgement   ADL Overall ADL's : Needs assistance/impaired Eating/Feeding: Set up;Sitting   Grooming: Set up;Sitting           Upper Body Dressing : Set up;Sitting   Lower Body Dressing: Contact guard assist;Sit to/from stand Lower Body Dressing Details (indicate cue type and reason): Able to figure 4 legs for socks Toilet Transfer: Contact guard assist;Ambulation;Rolling walker (2 wheels)   Toileting- Clothing Manipulation and Hygiene: Contact guard assist;Sit to/from stand       Functional mobility during ADLs: Contact guard assist;Rolling walker (2 wheels) General ADL Comments: Pt limited d/t decreased strength and activity tolerance     Vision Baseline Vision/History: 1 Wears glasses Patient Visual Report: No change from baseline Vision Assessment?: No apparent visual deficits            Pertinent Vitals/Pain Pain Assessment Pain Assessment: No/denies pain     Extremity/Trunk Assessment Upper Extremity Assessment Upper Extremity Assessment: Generalized weakness   Lower Extremity Assessment Lower Extremity Assessment: Defer to PT evaluation   Cervical / Trunk Assessment Cervical / Trunk Assessment: Normal   Communication Communication Communication: No apparent difficulties   Cognition Arousal:  Alert Behavior During Therapy: WFL for tasks assessed/performed Cognition: No apparent impairments       Following commands: Intact       Cueing  General  Comments   Cueing Techniques: Verbal cues  Family present and supportive during session           Home Living Family/patient expects to be discharged to:: Private residence Living Arrangements: Alone Available Help at Discharge: Family;Available PRN/intermittently Type of Home: House Home Access: Stairs to enter [employer] of Steps: 3   Home Layout: One level     Bathroom Shower/Tub: Chief Strategy Officer: Standard     Home Equipment: BSC/3in1;Cane - single point;Shower seat   Additional Comments: lives alone but thinks she could stay with sister      Prior Functioning/Environment Prior Level of Function : Independent/Modified Independent;Driving     Mobility Comments: Ind, no AD ADLs Comments: Ind    OT Problem List: Decreased strength;Decreased activity tolerance   OT Treatment/Interventions: Self-care/ADL training;Therapeutic exercise;Energy conservation;DME and/or AE instruction;Therapeutic activities;Patient/family education;Balance training      OT Goals(Current goals can be found in the care plan section)   Acute Rehab OT Goals Patient Stated Goal: To get stronger OT Goal Formulation: With patient Time For Goal Achievement: 02/08/24 Potential to Achieve Goals: Good   OT Frequency:  Min 2X/week       AM-PAC OT 6 Clicks Daily Activity     Outcome Measure Help from another person eating meals?: None Help from another person taking care of personal grooming?: A Little Help from another person toileting, which includes using toliet, bedpan, or urinal?: A Little Help from another person bathing (including washing, rinsing, drying)?: A Little Help from another person to put on and taking off regular upper body clothing?: A Little Help from another person to put on and taking off regular lower body clothing?: A Little 6 Click Score: 19   End of Session Equipment Utilized During Treatment: Rolling walker (2 wheels);Gait  belt Nurse Communication: Mobility status  Activity Tolerance: Patient tolerated treatment well Patient left: in bed;with call bell/phone within reach;with family/visitor present  OT Visit Diagnosis: Unsteadiness on feet (R26.81);Other abnormalities of gait and mobility (R26.89);Muscle weakness (generalized) (M62.81)                Time: 8680-8663 OT Time Calculation (min): 17 min Charges:  OT General Charges $OT Visit: 1 Visit OT Evaluation $OT Eval Moderate Complexity: 1 Mod  Emilo Gras C, OT  Acute Rehabilitation Services Office [phone] Secure chat preferred   Adrianne GORMAN Savers 01/25/2024, 2:57 PM

## 2024-01-25 NOTE — Progress Notes (Addendum)
 PROGRESS NOTE Olivia Walsh  FMW:[id number] DOB: 1960/11/29 DOA: 01/23/2024 PCP: [place], Olivia Walsh  Brief Narrative/Hospital Course: Olivia Walsh is a 63 y.o. female with PMH of  hypertension, hyperlipidemia, CAD status post CABG, obstructive  HCM, history of CVA, hypothyroidism, history of seizures, remote cerebral aneurysm repair, and CKD stage IV who presents with generalized weakness and weight loss. Patient explains that her husband was not eating much when dying of lung cancer, and she herself also stopped eating normally.  He died in 11/26/23 and she has continued to have poor appetite and decreased intake. She reports losing 75 to 80 pounds over the past year or so and has become progressively weak in general.  Generalized weakness has accelerated significantly recently, now to the point where she has a lot of difficulty getting up and ambulating unassisted. At [facility] ED: patient was found to be afebrile and saturating well on room air with heart rate in the 50s and stable blood pressure.  Labs are most notable for creatinine 4.07, serum bicarbonate 17, anion gap 22, AST 270, ALT 141, normal bilirubin, normal WBC, and troponin 293. Chest x-ray: No acute finding Head CT notable for chronic SDH versus hygroma. CT of the chest, abdomen, and pelvis: No acute finding though left ovarian cyst is incidentally noted.    Subjective: Seen and examined Feels well no chest pain Overnight afebrile BP stable Labs shows renal function continues to improve 3.1 mild hyperkalemia AST ALT overall stable/elevated  Assessment and plan:  AKI on on CKD IV Elevated AG metabolic acidosis Hypokalemia:  Baseline BUN/Cr  ~31/2.59 in Jan 2025, on admission 61/4.0 CT without hydronephrosis, patient on diuretics, holding for now, encourage oral intake, on gentle IVF and creatinine nicely improving.avoid nephrotoxic medication renally dose meds, place electrolytes Recent Labs     03/12/23 1635 01/23/24 1609 01/24/24 0648 01/25/24 0329  BUN 31* 61* 57* 50*  CREATININE 2.59* 4.07* 3.55* 3.18*  CO2 28 17* 23 23  K 5.1 3.9 3.0* 3.2*    Intake/Output Summary (Last 24 hours) at 01/25/2024 1143 Last data filed at 01/25/2024 0327 Gross per 24 hour  Intake 1636.67 ml  Output --  Net 1636.67 ml    Elevated troponin CAD HX s/p CABG IN 2000 Hypertrophic cardiomyopathy hx: Troponin was 293> 25>64 she has LBBB on EKG-but no chest pain.   Repeat EKG sinus bradycardia, ST and T wave abnormality in the inferolateral lead, nonspecific intraventricular conduction delay. no LBBB- she denies any chest pain DOE sypmtoms anytime- Echo shows EF 65 to 70% no dynamic LVOT obstruction, no RWMA moderate asymmetric left ventricular hypertrophy of the basal segment RV systolic function normal size with normal She sees Olivia Walsh repeat EKG if any ischemic changes will discuss with cardiology vs outpatient follow up. Spoke w/ Olivia Walsh- and reviewed- suspect pt had worsening of LVOT ( w/ her HCM) due ot volume depletion and contritnutign with hypoerfusion demand ischemia. He will let Olivia Walsh know and no need for further inpatient work up  to cont ivf. Continue home Plavix , statin due to elevated LFTs   Elevated LFTs:  AST 270 and ALT 141 with normal alk phos, normal TB.liver gallbladder unremarkable on CT scan.  ACUTE viral hepatitis panel NEG, does have mild rhabdomyolysis which could be etiology of transaminitis.   Hold Crestor  for now, and trend LFTs-slightly downtrending.TREND. Recent Labs  Lab 01/23/24 1609 01/24/24 0648 01/25/24 0329  AST 270* 190* 186*  ALT 141* 94* 95*  ALKPHOS 50  33* 49  BILITOT 0.3 0.5 0.4  PROT 7.0 5.5* 5.5*  ALBUMIN 4.2 2.8* 2.8*  PLT 180 179 167   Non traumatic Rhabdomyolysis: CK5 699, trend CK continue IVF.  Holding statin Recent Labs  Lab 01/24/24 0648  CKTOTAL 5,691*    Mild anemia: Anemia panel with B12 on lower side 183  and folate normal iron ok.  Add B12 replacement  Seizures hx: Continue Keppra     Hypothyroidism:  TSH slightly up to 8.5 check free T4, continue Synthroid     Hx of CVA:  Hold Crestor  as above     Hypertension:  BP remains stable continue to hold antihypertensives and treat  w/ PRNs   Left ovarian cyst   Noted incidentally on CT in ED, follow-up outpatient   Generalized weakness, debility  Failure to thrive weight loss Loss of appetite Severe malnutrition-Body mass index is 18.34 kg/m.  Likely related to weight loss and renal failure.  Augment diet dietitian on board.  Continue PT OT  Mobility: PT Orders: Active PT Follow up Rec: Home Health Pt11/28/2025 1221   DVT prophylaxis: heparin  injection 5,000 Units Start: 01/23/24 2200 Code Status:   Code Status: Full Code Family Communication: plan of care discussed with patient at bedside. Patient status is: Remains hospitalized because of severity of illness Level of care: Telemetry   Dispo: The patient is from: home            Anticipated disposition: TBD Objective: Vitals last 24 hrs: Vitals:   01/24/24 2019 01/25/24 0003 01/25/24 0317 01/25/24 0852  BP: 138/68 137/77 (!) 152/74 121/67  Pulse: (!) 56 (!) 59 64 62  Resp: 15 20 14 16   Temp: 98.1 F (36.7 C) 97.7 F (36.5 C) 97.7 F (36.5 C) (!) 97.5 F (36.4 C)  TempSrc: Oral Oral Oral Oral  SpO2: 100% 96% 99% 100%  Weight:      Height:        Physical Examination: General exam: alert awake thin HEENT:Oral mucosa moist, Ear/Nose WNL grossly Respiratory system: CTA Bilaterally  Cardiovascular system: S1 & S2 +, No JVD. Gastrointestinal system: Abdomen soft,NT,ND, BS+ Nervous System: Alert, awake, moving all extremities,and following commands. Extremities: extremities warm, leg edema neg Skin: Warm, no rashes MSK: Normal muscle bulk,tone, power   Medications reviewed:  Scheduled Meds:  clopidogrel   75 mg Oral Daily   vitamin B-12  500 mcg Oral Daily    feeding supplement  237 mL Oral BID BM   heparin   5,000 Units Subcutaneous Q8H   levETIRAcetam   500 mg Oral BID   levothyroxine   50 mcg Oral QAC breakfast   sodium chloride  flush  3 mL Intravenous Q12H   Continuous Infusions:  sodium chloride  75 mL/hr at 01/25/24 0940   Diet: Diet Order             Diet regular Room service appropriate? Yes; Fluid consistency: Thin  Diet effective now                    Data Reviewed: I have personally reviewed following labs and imaging studies ( see epic result tab) CBC: Recent Labs  Lab 01/23/24 1609 01/24/24 0648 01/25/24 0329  WBC 7.5 6.4 6.2  NEUTROABS 5.1  --   --   HGB 11.1* 9.6* 9.0*  HCT 33.4* 28.3* 27.3*  MCV 92.3 91.9 92.5  PLT 180 179 167   CMP: Recent Labs  Lab 01/23/24 1609 01/24/24 0648 01/25/24 0329  NA 134* 137 139  K 3.9  3.0* 3.2*  CL 95* 98 104  CO2 17* 23 23  GLUCOSE 74 78 88  BUN 61* 57* 50*  CREATININE 4.07* 3.55* 3.18*  CALCIUM  9.4 7.9* 7.8*  MG 2.9*  --   --   PHOS  --  5.5*  --    GFR: Estimated Creatinine Clearance: 14.8 mL/min (A) (by C-G formula based on SCr of 3.18 mg/dL (H)). Recent Labs  Lab 01/23/24 1609 01/24/24 0648 01/25/24 0329  AST 270* 190* 186*  ALT 141* 94* 95*  ALKPHOS 50 33* 49  BILITOT 0.3 0.5 0.4  PROT 7.0 5.5* 5.5*  ALBUMIN 4.2 2.8* 2.8*   No results for input(s): LIPASE, AMYLASE in the last 168 hours. No results for input(s): AMMONIA in the last 168 hours. Coagulation Profile: No results for input(s): INR, PROTIME in the last 168 hours. Unresulted Labs (From admission, onward)     Start     Ordered   01/26/24 0500  CK  Daily,   R      01/25/24 0746   01/26/24 0500  T4, free  Tomorrow morning,   R        01/25/24 0747   01/24/24 0500  Comprehensive metabolic panel  Daily,   R      01/23/24 2056   01/24/24 0500  CBC  Daily,   R      01/23/24 2056           Antimicrobials/Microbiology: Anti-infectives (From admission, onward)    None          Component Value Date/Time   SDES  01/31/2021 0103    BLOOD LEFT WRIST Performed at [facility], [address], [place], [place] [address]    SDES  01/31/2021 0103    BLOOD RIGHT HAND Performed at [facility], [address], [place], [place] [address]    SPECREQUEST  01/31/2021 0103    BOTTLES DRAWN AEROBIC ONLY Blood Culture adequate volume Performed at [facility], [address] [place], [place] [address]    SPECREQUEST  01/31/2021 0103    BOTTLES DRAWN AEROBIC ONLY Blood Culture adequate volume Performed at [facility], [address] [place], [place] [address]    CULT  01/31/2021 0103    NO GROWTH 5 DAYS Performed at [facility], [address] [place], [place] [address]    CULT  01/31/2021 0103    NO GROWTH 5 DAYS Performed at [facility], [address] [place], [place] [address]    REPTSTATUS 02/05/2021 FINAL 01/31/2021 0103   REPTSTATUS 02/05/2021 FINAL 01/31/2021 0103    Procedures:  Mennie LAMY, [facility] 01/25/2024, 2:20 PM

## 2024-01-26 DIAGNOSIS — E43 Unspecified severe protein-calorie malnutrition: Secondary | ICD-10-CM

## 2024-01-26 DIAGNOSIS — I10 Essential (primary) hypertension: Secondary | ICD-10-CM | POA: Diagnosis not present

## 2024-01-26 DIAGNOSIS — I2510 Atherosclerotic heart disease of native coronary artery without angina pectoris: Secondary | ICD-10-CM | POA: Diagnosis not present

## 2024-01-26 DIAGNOSIS — R7989 Other specified abnormal findings of blood chemistry: Secondary | ICD-10-CM | POA: Diagnosis not present

## 2024-01-26 DIAGNOSIS — N179 Acute kidney failure, unspecified: Secondary | ICD-10-CM | POA: Diagnosis not present

## 2024-01-26 LAB — CBC
HCT: 25.4 % — ABNORMAL LOW (ref 36.0–46.0)
Hemoglobin: 8.4 g/dL — ABNORMAL LOW (ref 12.0–15.0)
MCH: 30.9 pg (ref 26.0–34.0)
MCHC: 33.1 g/dL (ref 30.0–36.0)
MCV: 93.4 fL (ref 80.0–100.0)
Platelets: 146 K/uL — ABNORMAL LOW (ref 150–400)
RBC: 2.72 MIL/uL — ABNORMAL LOW (ref 3.87–5.11)
RDW: 15.4 % (ref 11.5–15.5)
WBC: 6.6 K/uL (ref 4.0–10.5)
nRBC: 0 % (ref 0.0–0.2)

## 2024-01-26 LAB — T4, FREE: Free T4: 0.49 ng/dL — ABNORMAL LOW (ref 0.61–1.12)

## 2024-01-26 LAB — COMPREHENSIVE METABOLIC PANEL WITH GFR
ALT: 89 U/L — ABNORMAL HIGH (ref 0–44)
AST: 153 U/L — ABNORMAL HIGH (ref 15–41)
Albumin: 2.4 g/dL — ABNORMAL LOW (ref 3.5–5.0)
Alkaline Phosphatase: 38 U/L (ref 38–126)
Anion gap: 10 (ref 5–15)
BUN: 39 mg/dL — ABNORMAL HIGH (ref 8–23)
CO2: 21 mmol/L — ABNORMAL LOW (ref 22–32)
Calcium: 7.9 mg/dL — ABNORMAL LOW (ref 8.9–10.3)
Chloride: 105 mmol/L (ref 98–111)
Creatinine, Ser: 2.76 mg/dL — ABNORMAL HIGH (ref 0.44–1.00)
GFR, Estimated: 18 mL/min — ABNORMAL LOW (ref >60)
Glucose, Bld: 90 mg/dL (ref 70–99)
Potassium: 3.8 mmol/L (ref 3.5–5.1)
Sodium: 136 mmol/L (ref 135–145)
Total Bilirubin: 0.4 mg/dL (ref 0.0–1.2)
Total Protein: 5 g/dL — ABNORMAL LOW (ref 6.5–8.1)

## 2024-01-26 LAB — CK: Total CK: 4743 U/L — ABNORMAL HIGH (ref 38–234)

## 2024-01-26 LAB — GLUCOSE, CAPILLARY
Glucose-Capillary: 95 mg/dL (ref 70–99)
Glucose-Capillary: 119 mg/dL — ABNORMAL HIGH (ref 70–99)
Glucose-Capillary: 94 mg/dL (ref 70–99)

## 2024-01-26 NOTE — Plan of Care (Signed)

## 2024-01-26 NOTE — Progress Notes (Signed)
 Progress Note   Patient: Olivia Walsh FMW:[id number] DOB: 1960/08/14 DOA: 01/23/2024     3 DOS: the patient was seen and examined on 01/26/2024   Brief hospital course: Olivia Walsh is a 63 y.o. female with PMH of  hypertension, hyperlipidemia, CAD status post CABG, obstructive  HCM, history of CVA, hypothyroidism, history of seizures, remote cerebral aneurysm repair, and CKD stage IV who presents with generalized weakness and weight loss. Patient explains that her husband was not eating much when dying of lung cancer, and she herself also stopped eating normally.  He died in 2023-11-30 and she has continued to have poor appetite and decreased intake. She reports losing 75 to 80 pounds over the past year or so and has become progressively weak in general.  Generalized weakness has accelerated significantly recently, now to the point where she has a lot of difficulty getting up and ambulating unassisted. At [facility] ED: patient was found to be afebrile and saturating well on room air with heart rate in the 50s and stable blood pressure.  Labs are most notable for creatinine 4.07, serum bicarbonate 17, anion gap 22, AST 270, ALT 141, normal bilirubin, normal WBC, and troponin 293. Chest x-ray: No acute finding Head CT notable for chronic SDH versus hygroma. CT of the chest, abdomen, and pelvis: No acute finding though left ovarian cyst is incidentally noted.   Assessment and plan: AKI on on CKD IV Elevated AG metabolic acidosis Hypokalemia:  Baseline BUN/Cr  ~31/2.59 in Jan 2025, on admission 61/4.0 CT without hydronephrosis, patient on diuretics, holding for now, encourage oral intake, on gentle IVF and creatinine improved and back to baseline. Avoid nephrotoxic medication renally dose meds, trend and replace electrolytes  Elevated troponin CAD HX s/p CABG IN 2000 Hypertrophic cardiomyopathy hx: Troponin was 293> 25>64 she has LBBB on EKG-but no chest pain.   Repeat EKG sinus  bradycardia, ST and T wave abnormality in the inferolateral lead, nonspecific intraventricular conduction delay. no LBBB- she denies any chest pain DOE sypmtoms anytime- Echo shows EF 65 to 70% no dynamic LVOT obstruction, no RWMA moderate asymmetric left ventricular hypertrophy of the basal segment RV systolic function normal size with normal Spoke w/ Dr Kennyth- and reviewed- suspect pt had worsening of LVOT ( w/ her HCM) due ot volume depletion and contritnutign with hypoerfusion demand ischemia. He will let Dr Santo know and no need for further inpatient work up  to cont ivf. Continue home Plavix , statin due to elevated LFTs   Elevated LFTs:  AST 270 and ALT 141 with normal alk phos, normal TB.liver gallbladder unremarkable on CT scan.  ACUTE viral hepatitis panel NEG, does have mild rhabdomyolysis which could be etiology of transaminitis.   Hold Crestor  for now, and trend LFTs-slightly downtrending.TREND.  Non traumatic Rhabdomyolysis: CK trending down with IVF.  Continue to hold statin  Mild anemia: Anemia panel with B12 on lower side 183 and folate normal iron ok.  Continue B12 replacement  Seizures hx: Continue Keppra     Hypothyroidism:  TSH slightly up to 8.5 check free T4, continue Synthroid     Hx of CVA:  Hold Crestor  as above     Hypertension:  BP remains stable continue to hold antihypertensives and treat  w/ PRNs   Left ovarian cyst   Noted incidentally on CT in ED, follow-up outpatient.   Generalized weakness, debility  Failure to thrive weight loss Loss of appetite Severe malnutrition-Body mass index is 18.34 kg/m.  Likely related to weight loss  and renal failure.  Augment diet dietitian on board.  Continue PT OT    Out of bed to chair. Incentive spirometry. Nursing supportive care. Fall, aspiration precautions. Diet:  Diet Orders (From admission, onward)     Start     Ordered   01/23/24 2053  Diet regular Room service appropriate? Yes; Fluid  consistency: Thin  Diet effective now       Question Answer Comment  Room service appropriate? Yes   Fluid consistency: Thin      01/23/24 2056           DVT prophylaxis: heparin  injection 5,000 Units Start: 01/23/24 2200  Level of care: Telemetry   Code Status: Full Code  Subjective: Patient is seen and examined today morning. Says her appetite improved, able to drink ensure. Getting out of bed. No overnight issues.  Physical Exam: Vitals:   01/25/24 2351 01/26/24 0358 01/26/24 0726 01/26/24 1120  BP: (!) 153/88 (!) 157/84 (!) 159/81 (!) 164/79  Pulse: 69 73 65 72  Resp: 15 20  19   Temp: 98.3 F (36.8 C) 98.1 F (36.7 C) (!) 96.7 F (35.9 C) 98.7 F (37.1 C)  TempSrc: Oral Oral Axillary Oral  SpO2: 100% 100% 100% 99%  Weight:      Height:        General - Elderly thin built Caucasian female, no apparent distress HEENT - PERRLA, EOMI, atraumatic head, non tender sinuses. Lung - Clear, no rales, rhonchi, wheezes. Heart - S1, S2 heard, no murmurs, rubs, no pedal edema. Abdomen - Soft, non tender, bowel sounds good Neuro - Alert, awake and oriented x 3, non focal exam. Skin - Warm and dry.  Data Reviewed:      Latest Ref Rng & Units 01/26/2024    2:16 AM 01/25/2024    3:29 AM 01/24/2024    6:48 AM  CBC  WBC 4.0 - 10.5 K/uL 6.6  6.2  6.4   Hemoglobin 12.0 - 15.0 g/dL 8.4  9.0  9.6   Hematocrit 36.0 - 46.0 % 25.4  27.3  28.3   Platelets 150 - 400 K/uL 146  167  179       Latest Ref Rng & Units 01/26/2024    2:16 AM 01/25/2024    3:29 AM 01/24/2024    6:48 AM  BMP  Glucose 70 - 99 mg/dL 90  88  78   BUN 8 - 23 mg/dL 39  50  57   Creatinine 0.44 - 1.00 mg/dL 7.23  6.81  6.44   Sodium 135 - 145 mmol/L 136  139  137   Potassium 3.5 - 5.1 mmol/L 3.8  3.2  3.0   Chloride 98 - 111 mmol/L 105  104  98   CO2 22 - 32 mmol/L 21  23  23    Calcium  8.9 - 10.3 mg/dL 7.9  7.8  7.9    No results found.  Family Communication: Discussed with patient, understand and  agree. All questions answered.  Disposition: Status is: Inpatient Remains inpatient appropriate because: trend CK, kidney function, LFT, electrolytes.  Planned Discharge Destination: Home with Home Health     Time spent: 42 minutes  Author: Concepcion Riser, MD 01/26/2024 1:32 PM Secure chat 7am to 7pm For on call review [email or web address].

## 2024-01-27 LAB — COMPREHENSIVE METABOLIC PANEL WITH GFR
ALT: 86 U/L — ABNORMAL HIGH (ref 0–44)
AST: 130 U/L — ABNORMAL HIGH (ref 15–41)
Albumin: 2.4 g/dL — ABNORMAL LOW (ref 3.5–5.0)
Alkaline Phosphatase: 35 U/L — ABNORMAL LOW (ref 38–126)
Anion gap: 8 (ref 5–15)
BUN: 36 mg/dL — ABNORMAL HIGH (ref 8–23)
CO2: 23 mmol/L (ref 22–32)
Calcium: 8.5 mg/dL — ABNORMAL LOW (ref 8.9–10.3)
Chloride: 104 mmol/L (ref 98–111)
Creatinine, Ser: 2.44 mg/dL — ABNORMAL HIGH (ref 0.44–1.00)
GFR, Estimated: 21 mL/min — ABNORMAL LOW (ref >60)
Glucose, Bld: 85 mg/dL (ref 70–99)
Potassium: 3.7 mmol/L (ref 3.5–5.1)
Sodium: 135 mmol/L (ref 135–145)
Total Bilirubin: 0.4 mg/dL (ref 0.0–1.2)
Total Protein: 5.1 g/dL — ABNORMAL LOW (ref 6.5–8.1)

## 2024-01-27 LAB — CBC
HCT: 27 % — ABNORMAL LOW (ref 36.0–46.0)
Hemoglobin: 8.8 g/dL — ABNORMAL LOW (ref 12.0–15.0)
MCH: 30.8 pg (ref 26.0–34.0)
MCHC: 32.6 g/dL (ref 30.0–36.0)
MCV: 94.4 fL (ref 80.0–100.0)
Platelets: 145 K/uL — ABNORMAL LOW (ref 150–400)
RBC: 2.86 MIL/uL — ABNORMAL LOW (ref 3.87–5.11)
RDW: 15.2 % (ref 11.5–15.5)
WBC: 6.3 K/uL (ref 4.0–10.5)
nRBC: 0 % (ref 0.0–0.2)

## 2024-01-27 LAB — GLUCOSE, CAPILLARY
Glucose-Capillary: 156 mg/dL — ABNORMAL HIGH (ref 70–99)
Glucose-Capillary: 88 mg/dL (ref 70–99)

## 2024-01-27 LAB — CK: Total CK: 3235 U/L — ABNORMAL HIGH (ref 38–234)

## 2024-01-27 MED ORDER — CYANOCOBALAMIN 500 MCG PO TABS: 500.0000 ug | ORAL_TABLET | Freq: Every day | ORAL | 2 refills | Status: AC

## 2024-01-27 NOTE — Care Management Important Message (Signed)
 Important Message  Patient Details  Name: Olivia Walsh MRN: [id number] Date of Birth: 17-Feb-1961   Important Message Given:  No     Jennie Laneta Dragon 01/27/2024, 4:23 PM

## 2024-01-27 NOTE — Progress Notes (Signed)
 Patient discharged, Important Message Letter mailed to patient.

## 2024-01-27 NOTE — Progress Notes (Signed)
 Mobility Specialist Progress Note;   01/27/24 0932  Mobility  Activity Ambulated with assistance  Level of Assistance Standby assist, set-up cues, supervision of patient - no hands on  Assistive Device Front wheel walker  Distance Ambulated (ft) 200 ft  Activity Response Tolerated well  Mobility Referral Yes  Mobility visit 1 Mobility  Mobility Specialist Start Time (ACUTE ONLY) 0932  Mobility Specialist Stop Time (ACUTE ONLY) 0946  Mobility Specialist Time Calculation (min) (ACUTE ONLY) 14 min   Pt agreeable to mobility. Required no physical assistance during ambulation, SV for safety. VSS throughout. No c/o when asked, just some dyspnea. Pt returned back to bed and left with all needs met.   Lauraine Erm Mobility Specialist Please contact via SecureChat or [employer] [phone]

## 2024-01-27 NOTE — Progress Notes (Deleted)
 Cardiology Office Note   Date:  01/27/2024  ID:  Olivia Walsh, DOB 26-May-1960, MRN [id number] PCP: de [place], Raymond J, MD  [facility] Providers Cardiologist:  Lynwood Schilling, MD   History of Present Illness Olivia Walsh is a 63 y.o. female with a past medical history significant for obstructive hypertrophic cardiomyopathy (peak gradient 102 in 2022, moderate MR, septal thickness 60 mm, no LGE).  Here for follow-up appointment.  In 2023 she had lightheadedness, fatigue and did not feel well.  Deferred CMI's but wanted to try stress testing ( has dropfoot) for symptom burden.  Was unable to do testing.  She was started on cardiac myosin inhibitors incidentally had elevated LFTs.  She had cut back on her Anastacia powders (we recommended cessation).  We temporarily stopped her statin and Zetia .  Then in 2024, she was on 2.5 mg of mavacamten  and felt better.  She did not do her exercise prescription attempt.  Husband had an ablation and afterwards she was going to the gym.  Husband unfortunately had stage IV cancer and he did poorly on his current therapy.  She was last seen 09/18/2022 and at that time she was having shortness of breath at rest but no DOE.  She noted no fatigue, palpitations, CP, dizziness, or syncope.  She was recently seen in the emergency department for feeling very weak.  She has no energy to do anything and started drinking ensures but this has not helped.  She has lost over 75 pounds over the last year.  Lost her husband this past September and was not eating much then because he was not eating much.  Was diagnosed with dehydration and given IV fluids.  Had an elevated troponin but no chest pain.  Elevated LFTs and AKI due to dehydration and poor oral intake.  When she was last seen in our office 09/18/2022 she weighed 141 and now is 117.  Today, she***  ROS: ***  Studies Reviewed      Echocardiogram 01/24/2024  IMPRESSIONS     1. There is  evidence of dynamic LVOT obstruction. At rest, the mean LV  gradient is mildly elevated at 31 mmHg. With valvalsa, the mean LV  gradient becomes significantly elevated to 63 mmHg.   2. Left ventricular ejection fraction, by estimation, is 65 to 70%. The  left ventricle has normal function. The left ventricle has no regional  wall motion abnormalities. There is moderate asymmetric left ventricular  hypertrophy of the basal-septal  segment. Left ventricular diastolic parameters are indeterminate.   3. Right ventricular systolic function is normal. The right ventricular  size is normal. Tricuspid regurgitation signal is inadequate for assessing  PA pressure.   4. The mitral valve is normal in appearance. There is evidence of  systolic annular motion of the mitral valve with septal contact. There is  mild-moderate, posteriorly directed, mitral regurgitation. No mitral  stenosis.   5. The aortic valve is tricuspid. Aortic valve regurgitation is not  visualized. No aortic stenosis is present.   6. The inferior vena cava is normal in size with greater than 50%  respiratory variability, suggesting right atrial pressure of 3 mmHg.   Comparison(s): Changes from prior study are noted. Mild gradient through  LVOT now present.   Conclusion(s)/Recommendation(s): Significantly elevated LVOT gradient with  valsalva and thin/collapsible IVC suggest dehydration. Consider IV fluid  administration if clinically appropriate.   FINDINGS   Left Ventricle: Left ventricular ejection fraction, by estimation, is 65  to  70%. The left ventricle has normal function. The left ventricle has no  regional wall motion abnormalities. The left ventricular internal cavity  size was small. There is moderate   asymmetric left ventricular hypertrophy of the basal-septal segment. Left  ventricular diastolic parameters are indeterminate.   Right Ventricle: The right ventricular size is normal. No increase in  right  ventricular wall thickness. Right ventricular systolic function is  normal. Tricuspid regurgitation signal is inadequate for assessing PA  pressure.   Left Atrium: Left atrial size was normal in size.   Right Atrium: Right atrial size was normal in size.   Pericardium: There is no evidence of pericardial effusion.   Mitral Valve: The mitral valve is normal in appearance. There is evidence  of systolic annular motion of the mitral valve with septal contact. There  is mild-moderate, posteriorly directed, mitral regurgitation. No mitral  stenosis.   Tricuspid Valve: The tricuspid valve is normal in structure. Tricuspid  valve regurgitation is trivial. No evidence of tricuspid stenosis.   Aortic Valve: The aortic valve is tricuspid. Aortic valve regurgitation is  not visualized. No aortic stenosis is present. Aortic valve mean gradient  measures 19.0 mmHg. Aortic valve peak gradient measures 26.5 mmHg. Aortic  valve area, by VTI measures  2.13 cm.   Pulmonic Valve: The pulmonic valve was not well visualized. Pulmonic valve  regurgitation is not visualized. No evidence of pulmonic stenosis.   Aorta: The aortic root and ascending aorta are structurally normal, with  no evidence of dilitation.   Venous: The inferior vena cava is normal in size with greater than 50%  respiratory variability, suggesting right atrial pressure of 3 mmHg.   IAS/Shunts: No atrial level shunt detected by color flow Doppler.    ECHOCARDIOGRAM   ECHOCARDIOGRAM LIMITED 08/31/2022   Narrative ECHOCARDIOGRAM LIMITED REPORT       Patient Name:   Olivia Walsh Date of Exam: 08/31/2022 Medical Rec #:  [id number]           Height:       66.5 in Accession #:    [id number]          Weight:       151.6 lb Date of Birth:  [birth year]           BSA:          1.788 m Patient Age:    62 years            BP:           122/82 mmHg Patient Gender: F                   HR:           50 bpm. Exam Location:  [address]   Procedure: 2D Echo, Limited Echo, Cardiac Doppler and Limited Color Doppler   Indications:    I42.0 HOCM   History:        Patient has prior history of Echocardiogram examinations, most recent 06/08/2022. Hypertrophic Cardiomyopathy, CAD, Prior CABG, PAD; Risk Factors:Family History of Coronary Artery Disease, Hypertension, Dyslipidemia and Current Smoker. HOCM- Camzyos  2.5mg .   Sonographer:    Heather Hawks RDCS Referring Phys: [facility] A CHANDRASEKHAR   IMPRESSIONS     1. Left ventricular ejection fraction, by estimation, is 60 to 65%. The left ventricle has normal function. Severe asymmetric hypertrophy of the basal septum. No significant LV outflow gradient was measured at rest or with Valsalva. The left ventricle demonstrates regional  wall motion abnormalities with basal inferior severe hypokinesis. Left ventricular diastolic parameters are consistent with Grade I diastolic dysfunction (impaired relaxation). 2. Right ventricular systolic function is normal. The right ventricular size is normal. There is normal pulmonary artery systolic pressure. The estimated right ventricular systolic pressure is 18.1 mmHg. 3. The mitral valve is normal in structure, no mitral valve SAM was noted. Mild mitral valve regurgitation. No evidence of mitral stenosis. 4. The aortic valve is tricuspid. There is mild calcification of the aortic valve. No aortic stenosis is present. 5. The inferior vena cava is normal in size with greater than 50% respiratory variability, suggesting right atrial pressure of 3 mmHg. 6. Aortic dilatation noted. There is mild dilatation of the ascending aorta, measuring 38 mm.   FINDINGS Left Ventricle: Left ventricular ejection fraction, by estimation, is 60 to 65%. The left ventricle has normal function. The left ventricle demonstrates regional wall motion abnormalities. There is severe asymmetric left ventricular hypertrophy of the basal-septal segment. Left  ventricular diastolic parameters are consistent with Grade I diastolic dysfunction (impaired relaxation).   Right Ventricle: The right ventricular size is normal. No increase in right ventricular wall thickness. Right ventricular systolic function is normal. There is normal pulmonary artery systolic pressure. The tricuspid regurgitant velocity is 1.94 m/s, and with an assumed right atrial pressure of 3 mmHg, the estimated right ventricular systolic pressure is 18.1 mmHg.   Left Atrium: Left atrial size was normal in size.   Right Atrium: Right atrial size was normal in size.   Mitral Valve: The mitral valve is normal in structure. Mild mitral valve regurgitation. No evidence of mitral valve stenosis.   Tricuspid Valve: The tricuspid valve is normal in structure. Tricuspid valve regurgitation is trivial.   Aortic Valve: The aortic valve is tricuspid. There is mild calcification of the aortic valve. No aortic stenosis is present.   Pulmonic Valve: The pulmonic valve was normal in structure. Pulmonic valve regurgitation is trivial.   Aorta: The aortic root is normal in size and structure and aortic dilatation noted. There is mild dilatation of the ascending aorta, measuring 38 mm.   Venous: The inferior vena cava is normal in size with greater than 50% respiratory variability, suggesting right atrial pressure of 3 mmHg.   IAS/Shunts: No atrial level shunt detected by color flow Doppler.   LEFT VENTRICLE PLAX 2D LVOT diam:     2.40 cm   Diastology LV SV:         119       LV e' medial:    5.55 cm/s LV SV Index:   66        LV E/e' medial:  10.7 LVOT Area:     4.52 cm  LV e' lateral:   8.49 cm/s LV E/e' lateral: 7.0     RIGHT VENTRICLE RV Basal diam:  3.50 cm RV S prime:     7.94 cm/s TAPSE (M-mode): 1.7 cm RVSP:           18.1 mmHg   LEFT ATRIUM           Index        RIGHT ATRIUM           Index LA Vol (A4C): 40.1 ml 22.43 ml/m  RA Pressure: 3.00 mmHg RA Area:     13.50 cm RA  Volume:   34.80 ml  19.47 ml/m AORTIC VALVE LVOT Vmax:   103.87 cm/s LVOT Vmean:  68.467 cm/s LVOT VTI:  0.262 m   AORTA Ao Root diam: 3.50 cm Ao Asc diam:  3.80 cm   MITRAL VALVE               TRICUSPID VALVE MV Area (PHT)  cm         TR Peak grad:   15.1 mmHg MV Decel Time: 323 msec    TR Vmax:        194.00 cm/s MV E velocity: 59.20 cm/s  Estimated RAP:  3.00 mmHg MV A velocity: 80.70 cm/s  RVSP:           18.1 mmHg MV E/A ratio:  0.73 SHUNTS Systemic VTI:  0.26 m Systemic Diam: 2.40 cm   Dalton McleanMD Electronically signed by Ezra Kanner Signature Date/Time: 08/31/2022/6:19:41 PM       Final    MONITORS   LONG TERM MONITOR (3-14 DAYS) 10/03/2021   Narrative   Patient had a minimum heart rate of 41 bpm, maximum heart rate of 177 bpm, and average heart rate of 51 bpm.   Predominant underlying rhythm was sinus rhythm.   Isolated PACs were rare (<1.0%).   Isolated PVCs were occasional (1.9%).   No triggered and diary events.   HCM with out evidence of NSVT.    CARDIAC MRI   MR CARDIAC MORPHOLOGY W WO CONTRAST 06/14/2021   Narrative CLINICAL DATA:  Hypertrophic Cardiomyopathy   EXAM: CARDIAC MRI   TECHNIQUE: The patient was scanned on a 1.5 Tesla Siemens magnet. A dedicated cardiac coil was used. Functional imaging was done using Fiesta sequences. 2,3, and 4 chamber views were done to assess for RWMA's. Modified Simpson's rule using a short axis stack was used to calculate an ejection fraction on a dedicated work [employer]. The patient received 8 cc of Gadavist . After 10 minutes inversion recovery sequences were used to assess for infiltration and scar tissue.   CONTRAST:  Gadavist    FINDINGS: Moderate LAE. Normal RA/RV. No ASD/PFO. Normal ascending thoracic aorta 3.4 cm Severe LV hypertrophy asymmetric with septal thickness 16 mm and lateral wall only 8 mm There is SAM with turbulence in the LVOT. Moderate appearing MR  Morphologic appearance consistent with obstructive hypertrophic cardiomyopathy Delayed gadolinium images show no high risk features with no infiltration, scar or infarct particularly in septum No significant pericardial effusion Quantitative LVEF 59% (EDV 130 cc ESV 54 cc SV 77 cc )   T1 1056 msec   ECV 30%   T2 50 msec   IMPRESSION: 1. Severe asymmetric septal hypertrophy thickness 16 mm compared to lateral wall 8 mm   2.  Normal LV size and function EF 59% no RWMAls   3. SAM with LVOT turbulence and moderate appearing MR consistent with obstructive hypertrophic cardiomyopathy   4.  Normal parametric measures see above   5. No high risk gadolinium uptake in LV particularly septum no infarct scar/infiltration noted   6.  Moderate LAE   Maude Emmer     Electronically Signed By: Maude Emmer M.D. On: 06/14/2021 12:32   Risk Assessment/Calculations {Does this patient have ATRIAL FIBRILLATION?:[phone]}         Physical Exam VS:  There were no vitals taken for this visit.       Wt Readings from Last 3 Encounters:  01/23/24 117 lb 1.6 oz (53.1 kg)  12/24/23 115 lb (52.2 kg)  06/06/23 130 lb 12.8 oz (59.3 kg)    GEN: Well nourished, well developed in no acute distress NECK: No  JVD; No carotid bruits CARDIAC: ***RRR, no murmurs, rubs, gallops RESPIRATORY:  Clear to auscultation without rales, wheezing or rhonchi  ABDOMEN: Soft, non-tender, non-distended EXTREMITIES:  No edema; No deformity   ASSESSMENT AND PLAN Hypertrophic cardiomyopathy -On beta-blocker and diuretics and has severe LVOT gradient therefore diuretics have been cut down to a minimum (as needed) he -NYHA class I*** -On mavacamten  2.5 mg with 12-week maintenance imaging -Transaminitis resolution with advice to minimize Goody powder -Genetic testing***  CAD status post CABG Tobacco abuse Hypertension    {Are you ordering a CV Procedure (e.g. stress test, cath, DCCV, TEE, etc)?   Press F2         :[id number]}  Dispo: ***  Signed, Orren LOISE Fabry, PA-C

## 2024-01-27 NOTE — Progress Notes (Signed)
 Physical Therapy Treatment Patient Details Name: Olivia Walsh MRN: [id number] DOB: 22-Feb-1961 Today's Date: 01/27/2024   History of Present Illness 63 yo F adm 01/23/24 with weakness, weight loss, AKI on CKD. PMhx: HTN, HLD, CAD s/p CABG, CM, CVA, Sz, cerebral aneurysm repair, CKD.    PT Comments  Pt received in supine, agreeable to therapy session with encouragement. Pt with good participation in transfer, gait and stair negotiation via 7 step in room. Pt with poor bil hip flexion strength against gravity, needing active assist to place either leg on top of 7 platform step. Up to CGA for gait with RW support due to foot drop causing LOB, pt encouraged to wear her AFO brace at home to reduce her fall risk and work on her HEP (link below) until HHPT progresses her to other exercises. Pt continues to benefit from PT services to progress toward functional mobility goals, continue to recommend HHPT upon DC.    If plan is discharge home, recommend the following: A little help with walking and/or transfers;A little help with bathing/dressing/bathroom;Assistance with cooking/housework;Help with stairs or ramp for entrance   Can travel by private vehicle        Equipment Recommendations  Rolling walker (2 wheels)    Recommendations for Other Services       Precautions / Restrictions Precautions Precautions: Fall Recall of Precautions/Restrictions: Intact Precaution/Restrictions Comments: lt foot drop Restrictions Weight Bearing Restrictions Per Provider Order: No     Mobility  Bed Mobility Overal bed mobility: Needs Assistance Bed Mobility: Supine to Sit, Sit to Supine     Supine to sit: Modified independent (Device/Increase time) Sit to supine: Modified independent (Device/Increase time), HOB elevated   General bed mobility comments: pt using BUE to assist with bringing BLE over EOB; given gait belt for home she can also use as a leg lifter    Transfers Overall transfer  level: Needs assistance Equipment used: Rolling walker (2 wheels) Transfers: Sit to/from Stand Sit to Stand: Supervision           General transfer comment: cues to reach back, pt tending to plop    Ambulation/Gait Ambulation/Gait assistance: Supervision, Contact guard assist Gait Distance (Feet): 200 Feet Assistive device: Rolling walker (2 wheels) Gait Pattern/deviations: Step-through pattern, Decreased stride length, Decreased dorsiflexion - right, Decreased dorsiflexion - left       General Gait Details: x1 LOB requiring BUE support of RW and CGA to maintain stability due to  foot drop/catching toe on floor. mostly Supervision with RW today.   Stairs Stairs: Yes Stairs assistance: Min assist, +2 safety/equipment Stair Management: Two rails, Step to pattern, Backwards, Forwards Number of Stairs: 2 General stair comments: single 7 platform step in room x2 reps, pt needs manual assist to lift LLE and RLE up on to step due to decreased bil hip flexion strength   Wheelchair Mobility     Tilt Bed    Modified Rankin (Stroke Patients Only)       Balance Overall balance assessment: Needs assistance Sitting-balance support: No upper extremity supported Sitting balance-Leahy Scale: Fair     Standing balance support: Bilateral upper extremity supported, No upper extremity supported, During functional activity Standing balance-Leahy Scale: Good Standing balance comment: benefits from RW                            Communication Communication Communication: No apparent difficulties  Cognition Arousal: Alert Behavior During Therapy: [facility] for tasks assessed/performed  PT - Cognitive impairments: No apparent impairments                         Following commands: Intact      Cueing Cueing Techniques: Verbal cues  Exercises Other Exercises Other Exercises: reviewed seated BLE exercises per HEP, including ankle pumps, LAQ, hip flexion, handout  given to reinforce, see link above; pt defers to demo back due to arrival of case mgr to speak with her at end of session Other Exercises: standing BLE AAROM: hip flexion with foot tap on 7 platform x5 reps ea LE BUE supported by RW   General Comments General comments (skin integrity, edema, etc.): sister present and supportive, reports she will be staying with the patient upon DC.    Visit    [email or web address]    Access Code: [email or web address] for HEP per pt and sister request      Pertinent Vitals/Pain Pain Assessment Pain Assessment: No/denies pain    Home Living                          Prior Function            PT Goals (current goals can now be found in the care plan section) Acute Rehab PT Goals Patient Stated Goal: Return home PT Goal Formulation: With patient Time For Goal Achievement: 02/07/24 Progress towards PT goals: Progressing toward goals    Frequency    Min 2X/week      PT Plan      Co-evaluation              AM-PAC PT 6 Clicks Mobility   Outcome Measure  Help needed turning from your back to your side while in a flat bed without using bedrails?: None Help needed moving from lying on your back to sitting on the side of a flat bed without using bedrails?: None Help needed moving to and from a bed to a chair (including a wheelchair)?: A Little Help needed standing up from a chair using your arms (e.g., wheelchair or bedside chair)?: A Little Help needed to walk in hospital room?: A Little Help needed climbing 3-5 steps with a railing? : A Lot (+2 safety) 6 Click Score: 19    End of Session   Activity Tolerance: Patient tolerated treatment well Patient left: in bed;with call bell/phone within reach;with family/visitor present Nurse Communication: Mobility status PT Visit Diagnosis: Other abnormalities of gait and mobility (R26.89);Difficulty in walking, not elsewhere classified (R26.2);Muscle weakness (generalized)  (M62.81)     Time: 8657-8641 PT Time Calculation (min) (ACUTE ONLY): 16 min  Charges:    $Gait Training: 8-22 mins PT General Charges $$ ACUTE PT VISIT: 1 Visit                     Gorge Almanza P., PTA Acute Rehabilitation Services Secure Chat Preferred 9a-5:30pm Office: [phone]    Connell HERO [facility] 01/27/2024, 2:42 PM

## 2024-01-27 NOTE — Discharge Summary (Signed)
 Physician Discharge Summary   Patient: Olivia Walsh MRN: [id number] DOB: September 01, 1960  Admit date:     01/23/2024  Discharge date: {dischdate:26783}  Discharge Physician: Concepcion Riser   PCP: [place], Quintin PARAS, MD   Recommendations at discharge:  {Tip this will not be part of the note when signed- Example include specific recommendations for outpatient follow-up, pending tests to follow-up on. (Optional):26781}  PCP follow up in 1 week. Follow repeat CBC in 1 week.  Discharge Diagnoses: Principal Problem:   Acute renal failure superimposed on stage 4 chronic kidney disease (HCC) Active Problems:   Hypothyroidism   Essential hypertension   CAD (coronary artery disease)   History of CVA (cerebrovascular accident)   Elevated LFTs   History of seizure   Obstructive hypertrophic cardiomyopathy (HCC)   Elevated troponin   High anion gap metabolic acidosis   Left ovarian cyst   Protein-calorie malnutrition, severe  Resolved Problems:   * No resolved hospital problems. *  Hospital Course: Giuliana Handyside is a 63 y.o. female with PMH of  hypertension, hyperlipidemia, CAD status post CABG, obstructive  HCM, history of CVA, hypothyroidism, history of seizures, remote cerebral aneurysm repair, and CKD stage IV who presents with generalized weakness and weight loss. Patient explains that her husband was not eating much when dying of lung cancer, and she herself also stopped eating normally.  He died in 2023/11/05 and she has continued to have poor appetite and decreased intake. She reports losing 75 to 80 pounds over the past year or so and has become progressively weak in general.  Generalized weakness has accelerated significantly recently, now to the point where she has a lot of difficulty getting up and ambulating unassisted. At [facility] ED: patient was found to be afebrile and saturating well on room air with heart rate in the 50s and stable blood pressure.  Labs are  most notable for creatinine 4.07, serum bicarbonate 17, anion gap 22, AST 270, ALT 141, normal bilirubin, normal WBC, and troponin 293. Chest x-ray: No acute finding Head CT notable for chronic SDH versus hygroma. CT of the chest, abdomen, and pelvis: No acute finding though left ovarian cyst is incidentally noted.    Subjective: Seen and examined Feels well no chest pain Overnight afebrile BP stable Labs shows renal function continues to improve 3.1 mild hyperkalemia AST ALT overall stable/elevated  Assessment and plan:  AKI on on CKD IV Elevated AG metabolic acidosis Hypokalemia:  Baseline BUN/Cr  ~31/2.59 in Jan 2025, on admission 61/4.0 CT without hydronephrosis, patient on diuretics, holding for now, encourage oral intake, on gentle IVF and creatinine nicely improving.avoid nephrotoxic medication renally dose meds, place electrolytes Recent Labs    03/12/23 1635 01/23/24 1609 01/24/24 0648 01/25/24 0329  BUN 31* 61* 57* 50*  CREATININE 2.59* 4.07* 3.55* 3.18*  CO2 28 17* 23 23  K 5.1 3.9 3.0* 3.2*    Intake/Output Summary (Last 24 hours) at 01/25/2024 1143 Last data filed at 01/25/2024 0327 Gross per 24 hour  Intake 1636.67 ml  Output --  Net 1636.67 ml    Elevated troponin CAD HX s/p CABG IN 2000 Hypertrophic cardiomyopathy hx: Troponin was 293> 25>64 she has LBBB on EKG-but no chest pain.   Repeat EKG sinus bradycardia, ST and T wave abnormality in the inferolateral lead, nonspecific intraventricular conduction delay. no LBBB- she denies any chest pain DOE sypmtoms anytime- Echo shows EF 65 to 70% no dynamic LVOT obstruction, no RWMA moderate asymmetric left ventricular hypertrophy of  the basal segment RV systolic function normal size with normal She sees Dr Haywood repeat EKG if any ischemic changes will discuss with cardiology vs outpatient follow up. Spoke w/ Dr Kennyth- and reviewed- suspect pt had worsening of LVOT ( w/ her HCM) due ot volume depletion and  contritnutign with hypoerfusion demand ischemia. He will let Dr Santo know and no need for further inpatient work up  to cont ivf. Continue home Plavix , statin due to elevated LFTs   Elevated LFTs:  AST 270 and ALT 141 with normal alk phos, normal TB.liver gallbladder unremarkable on CT scan.  ACUTE viral hepatitis panel NEG, does have mild rhabdomyolysis which could be etiology of transaminitis.   Hold Crestor  for now, and trend LFTs-slightly downtrending.TREND. Recent Labs  Lab 01/23/24 1609 01/24/24 0648 01/25/24 0329  AST 270* 190* 186*  ALT 141* 94* 95*  ALKPHOS 50 33* 49  BILITOT 0.3 0.5 0.4  PROT 7.0 5.5* 5.5*  ALBUMIN 4.2 2.8* 2.8*  PLT 180 179 167   Non traumatic Rhabdomyolysis: CK5 699, trend CK continue IVF.  Holding statin Recent Labs  Lab 01/24/24 0648  CKTOTAL 5,691*    Mild anemia: Anemia panel with B12 on lower side 183 and folate normal iron ok.  Add B12 replacement  Seizures hx: Continue Keppra     Hypothyroidism:  TSH slightly up to 8.5 check free T4, continue Synthroid     Hx of CVA:  Hold Crestor  as above     Hypertension:  BP remains stable continue to hold antihypertensives and treat  w/ PRNs   Left ovarian cyst   Noted incidentally on CT in ED, follow-up outpatient   Generalized weakness, debility  Failure to thrive weight loss Loss of appetite Severe malnutrition-Body mass index is 18.34 kg/m.  Likely related to weight loss and renal failure.  Augment diet dietitian on board.  Continue PT OT  Mobility: PT Orders: Active PT Follow up Rec: Home Health Pt11/28/2025 1221   DVT prophylaxis: heparin  injection 5,000 Units Start: 01/23/24 2200 Code Status:   Code Status: Full Code Family Communication: plan of care discussed with patient at bedside. Patient status is: Remains hospitalized because of severity of illness Level of care: Telemetry   Dispo: The patient is from: home            Anticipated disposition:  TBD Objective: Vitals last 24 hrs: Vitals:   01/24/24 2019 01/25/24 0003 01/25/24 0317 01/25/24 0852  BP: 138/68 137/77 (!) 152/74 121/67  Pulse: (!) 56 (!) 59 64 62  Resp: 15 20 14 16   Temp: 98.1 F (36.7 C) 97.7 F (36.5 C) 97.7 F (36.5 C) (!) 97.5 F (36.4 C)  TempSrc: Oral Oral Oral Oral  SpO2: 100% 96% 99% 100%  Weight:      Height:        Physical Examination: General exam: alert awake thin HEENT:Oral mucosa moist, Ear/Nose WNL grossly Respiratory system: CTA Bilaterally  Cardiovascular system: S1 & S2 +, No JVD. Gastrointestinal system: Abdomen soft,NT,ND, BS+ Nervous System: Alert, awake, moving all extremities,and following commands. Extremities: extremities warm, leg edema neg Skin: Warm, no rashes MSK: Normal muscle bulk,tone, power   Medications reviewed:  Scheduled Meds:  clopidogrel   75 mg Oral Daily   vitamin B-12  500 mcg Oral Daily   feeding supplement  237 mL Oral BID BM   heparin   5,000 Units Subcutaneous Q8H   levETIRAcetam   500 mg Oral BID   levothyroxine   50 mcg Oral QAC breakfast   sodium  chloride flush  3 mL Intravenous Q12H   Continuous Infusions:  sodium chloride  75 mL/hr at 01/25/24 0940   Diet: Diet Order             Diet regular Room service appropriate? Yes; Fluid consistency: Thin  Diet effective now                    Assessment and Plan: No notes have been filed under this hospital service. Service: Hospitalist     {Tip this will not be part of the note when signed Body mass index is 18.34 kg/m. ,  Nutrition Documentation    Flowsheet Row ED to Hosp-Admission (Current) from 01/23/2024 in [facility] 4E CV SURGICAL PROGRESSIVE CARE  Nutrition Problem Severe Malnutrition  Etiology chronic illness  Nutrition Goal Patient will meet greater than or equal to 90% of their needs  Interventions Refer to RD note for recommendations, Ensure Enlive (each supplement provides 350kcal and 20 grams of protein)  ,   (Optional):26781}  {(NOTE) Pain control PDMP Statment (Optional):26782} Consultants: *** Procedures performed: ***  Disposition: {Plan; Disposition:26390} Diet recommendation:  Discharge Diet Orders (From admission, onward)     Start     Ordered   01/27/24 0000  Diet - low sodium heart healthy        01/27/24 1310           {Diet_Plan:26776} DISCHARGE MEDICATION: Allergies as of 01/27/2024       Reactions   Ace Inhibitors Cough   Penicillins Other (See Comments)   happened years ago (exact allergic reaction not recalled)   Prednisone  Other (See Comments)   Dysphoria        Medication List     STOP taking these medications    potassium chloride  10 MEQ tablet Commonly known as: KLOR-CON  M       TAKE these medications    clopidogrel  75 MG tablet Commonly known as: PLAVIX  TAKE 1 TABLET  DAILY TO  PREVENT BLOOD CLOTS                                                     /                                               TAKE                                 BY                                          MOUTH   cyanocobalamin 500 MCG tablet Commonly known as: VITAMIN B12 Take 1 tablet (500 mcg total) by mouth daily. Start taking on: January 28, 2024   fluticasone  50 MCG/ACT nasal spray Commonly known as: FLONASE  Place 2 sprays into both nostrils daily. What changed:  when to take this reasons to take this   furosemide  20 MG tablet Commonly known as: LASIX  TAKE 1 TABLET(20 MG) BY MOUTH DAILY AS NEEDED What changed: See the new instructions.   GOODY  HEADACHE PO Take 1 packet by mouth as needed (for headaches).   levETIRAcetam  500 MG tablet Commonly known as: KEPPRA  TAKE 1 TABLET BY MOUTH TWICE DAILY TO PREVENT SEIZURES What changed:  how much to take how to take this when to take this additional instructions   levothyroxine  50 MCG tablet Commonly known as: SYNTHROID  Take 1 tablet (50 mcg total) by mouth daily before breakfast.   mavacamten  2.5 MG  Caps capsule Commonly known as: CAMZYOS  Take 1 capsule (2.5 mg total) by mouth daily.   metoprolol  succinate 25 MG 24 hr tablet Commonly known as: TOPROL -XL TAKE 1 TABLET(25 MG) BY MOUTH DAILY What changed:  how much to take how to take this when to take this additional instructions   montelukast  10 MG tablet Commonly known as: SINGULAIR  Take 1 tablet (10 mg total) by mouth at bedtime. What changed:  when to take this reasons to take this   Repatha  SureClick 140 MG/ML Soaj Generic drug: Evolocumab  Inject 140 mg into the skin every 14 (fourteen) days.   rosuvastatin  40 MG tablet Commonly known as: CRESTOR  TAKE 1 TABLET BY MOUTH  DAILY FOR CHOLESTEROL What changed:  how much to take how to take this when to take this additional instructions   spironolactone  25 MG tablet Commonly known as: Aldactone  Take 1 tablet (25 mg total) by mouth daily.               Durable Medical Equipment  (From admission, onward)           Start     Ordered   01/27/24 1309  DME Walker  Once       Question Answer Comment  Walker: With 5 Inch Wheels   Patient needs a walker to treat with the following condition Physical debility      01/27/24 1310            Discharge Exam: Filed Weights   01/23/24 2054  Weight: 53.1 kg   ***  Condition at discharge: {DC Condition:26389}  The results of significant diagnostics from this hospitalization (including imaging, microbiology, ancillary and laboratory) are listed below for reference.   Imaging Studies: ECHOCARDIOGRAM COMPLETE Result Date: 01/24/2024    ECHOCARDIOGRAM REPORT   Patient Name:   Ladene Allocca Date of Exam: 01/24/2024 Medical Rec #:  [id number]           Height:       67.0 in Accession #:    [id number]          Weight:       117.1 lb Date of Birth:  12-16-1960           BSA:          1.611 m Patient Age:    63 years            BP:           105/57 mmHg Patient Gender: F                   HR:           52  bpm. Exam Location:  Inpatient Procedure: 2D Echo, Color Doppler and Cardiac Doppler (Both Spectral and Color            Flow Doppler were utilized during procedure). Indications:     Elevated Troponin  History:         Patient has prior history of Echocardiogram examinations, most  recent 07/30/2023. CAD; Risk Factors:Dyslipidemia.  Sonographer:     Sherlean Dubin Referring Phys:  EVALENE RAMAN OPYD Diagnosing Phys: Georganna Archer  Sonographer Comments: Image acquisition challenging due to respiratory motion. IMPRESSIONS  1. There is evidence of dynamic LVOT obstruction. At rest, the mean LV gradient is mildly elevated at 31 mmHg. With valvalsa, the mean LV gradient becomes significantly elevated to 63 mmHg.  2. Left ventricular ejection fraction, by estimation, is 65 to 70%. The left ventricle has normal function. The left ventricle has no regional wall motion abnormalities. There is moderate asymmetric left ventricular hypertrophy of the basal-septal segment. Left ventricular diastolic parameters are indeterminate.  3. Right ventricular systolic function is normal. The right ventricular size is normal. Tricuspid regurgitation signal is inadequate for assessing PA pressure.  4. The mitral valve is normal in appearance. There is evidence of systolic annular motion of the mitral valve with septal contact. There is mild-moderate, posteriorly directed, mitral regurgitation. No mitral stenosis.  5. The aortic valve is tricuspid. Aortic valve regurgitation is not visualized. No aortic stenosis is present.  6. The inferior vena cava is normal in size with greater than 50% respiratory variability, suggesting right atrial pressure of 3 mmHg. Comparison(s): Changes from prior study are noted. Mild gradient through LVOT now present. Conclusion(s)/Recommendation(s): Significantly elevated LVOT gradient with valsalva and thin/collapsible IVC suggest dehydration. Consider IV fluid administration if clinically  appropriate. FINDINGS  Left Ventricle: Left ventricular ejection fraction, by estimation, is 65 to 70%. The left ventricle has normal function. The left ventricle has no regional wall motion abnormalities. The left ventricular internal cavity size was small. There is moderate  asymmetric left ventricular hypertrophy of the basal-septal segment. Left ventricular diastolic parameters are indeterminate. Right Ventricle: The right ventricular size is normal. No increase in right ventricular wall thickness. Right ventricular systolic function is normal. Tricuspid regurgitation signal is inadequate for assessing PA pressure. Left Atrium: Left atrial size was normal in size. Right Atrium: Right atrial size was normal in size. Pericardium: There is no evidence of pericardial effusion. Mitral Valve: The mitral valve is normal in appearance. There is evidence of systolic annular motion of the mitral valve with septal contact. There is mild-moderate, posteriorly directed, mitral regurgitation. No mitral stenosis. Tricuspid Valve: The tricuspid valve is normal in structure. Tricuspid valve regurgitation is trivial. No evidence of tricuspid stenosis. Aortic Valve: The aortic valve is tricuspid. Aortic valve regurgitation is not visualized. No aortic stenosis is present. Aortic valve mean gradient measures 19.0 mmHg. Aortic valve peak gradient measures 26.5 mmHg. Aortic valve area, by VTI measures 2.13 cm. Pulmonic Valve: The pulmonic valve was not well visualized. Pulmonic valve regurgitation is not visualized. No evidence of pulmonic stenosis. Aorta: The aortic root and ascending aorta are structurally normal, with no evidence of dilitation. Venous: The inferior vena cava is normal in size with greater than 50% respiratory variability, suggesting right atrial pressure of 3 mmHg. IAS/Shunts: No atrial level shunt detected by color flow Doppler.  LEFT VENTRICLE PLAX 2D LVIDd:         3.10 cm   Diastology LVIDs:         2.30 cm    LV e' medial:    8.16 cm/s LV PW:         1.60 cm   LV E/e' medial:  11.1 LV IVS:        1.70 cm   LV e' lateral:   7.40 cm/s LVOT diam:     2.20 cm  LV E/e' lateral: 12.3 LV SV:         158 LV SV Index:   98 LVOT Area:     3.80 cm  RIGHT VENTRICLE          IVC RV Basal diam:  2.90 cm  IVC diam: 1.40 cm RV Mid diam:    1.90 cm TAPSE (M-mode): 1.6 cm LEFT ATRIUM             Index        RIGHT ATRIUM           Index LA diam:        3.50 cm 2.17 cm/m   RA Area:     12.70 cm LA Vol (A2C):   28.8 ml 17.88 ml/m  RA Volume:   28.80 ml  17.88 ml/m LA Vol (A4C):   28.5 ml 17.69 ml/m LA Biplane Vol: 29.8 ml 18.50 ml/m  AORTIC VALVE AV Area (Vmax):    2.77 cm AV Area (Vmean):   2.26 cm AV Area (VTI):     2.13 cm AV Vmax:           257.50 cm/s AV Vmean:          201.000 cm/s AV VTI:            0.743 m AV Peak Grad:      26.5 mmHg AV Mean Grad:      19.0 mmHg LVOT Vmax:         187.50 cm/s LVOT Vmean:        119.500 cm/s LVOT VTI:          0.416 m LVOT/AV VTI ratio: 0.56  AORTA Ao Root diam: 3.30 cm Ao Asc diam:  3.20 cm MITRAL VALVE MV Area (PHT): 7.74 cm    SHUNTS MV Decel Time: 98 msec     Systemic VTI:  0.42 m MV E velocity: 90.80 cm/s  Systemic Diam: 2.20 cm Georganna Archer Electronically signed by Georganna Archer Signature Date/Time: 01/24/2024/4:10:51 PM    Final    CT CHEST ABDOMEN PELVIS WO CONTRAST Result Date: 01/23/2024 EXAM: CT CHEST, ABDOMEN AND PELVIS WITHOUT CONTRAST 01/23/2024 05:47:58 PM TECHNIQUE: CT of the chest, abdomen and pelvis was performed without the administration of intravenous contrast. Multiplanar reformatted images are provided for review. Automated exposure control, iterative reconstruction, and/or weight based adjustment of the mA/kV was utilized to reduce the radiation dose to as low as reasonably achievable. COMPARISON: Chest radiograph 01/23/2024, chest CT 05/21/2022, abdominal ultrasound 02/14/2022, and MRI abdomen 11/28/2021. CLINICAL HISTORY: Weight loss, unintended.  FINDINGS: CHEST: MEDIASTINUM AND LYMPH NODES: Heart and pericardium are unremarkable. Postoperative changes in the mediastinum consistent with coronary bypass. Sternotomy wires. Calcification of the aorta and coronary arteries. No aneurysm. The central airways are clear. No mediastinal, hilar or axillary lymphadenopathy. LUNGS AND PLEURA: Scattered pulmonary nodules bilaterally. The largest is in the right upper lobe, image 25, measuring 2 mm diameter. No change since prior study. No focal consolidation or pulmonary edema. No pleural effusion or pneumothorax. ABDOMEN AND PELVIS: LIVER: The liver is unremarkable. GALLBLADDER AND BILE DUCTS: Gallbladder is unremarkable. No biliary ductal dilatation. SPLEEN: No acute abnormality. PANCREAS: No acute abnormality. ADRENAL GLANDS: No acute abnormality. KIDNEYS, URETERS AND BLADDER: Cyst in the upper pole of the right kidney measuring 7.1 cm in diameter. Internal septation and diffuse mild calcifications are present. No change since prior study. No imaging follow-up is indicated. Subcentimeter hyperdense lesions in the lower pole of the right kidney consistent  with hemorrhagic cyst, unchanged. No imaging follow-up is indicated. No stones in the kidneys or ureters. No hydronephrosis. No perinephric or periureteral stranding. Urinary bladder is unremarkable. GI AND BOWEL: The stomach is decompressed. Contrast material is demonstrated throughout the colon and rectum, suggesting no evidence of obstruction. The small bowel is decompressed. No wall thickening or inflammatory stranding. REPRODUCTIVE ORGANS: The uterus is not enlarged. Left ovarian cyst measuring 4.1 cm diameter. PERITONEUM AND RETROPERITONEUM: No ascites. No free air. VASCULATURE: Extensive calcification of the aorta and iliac arteries without aneurysm. ABDOMINAL AND PELVIS LYMPH NODES: No lymphadenopathy. BONES AND SOFT TISSUES: Sternotomy wires. Degenerative changes in the spine. No focal bone lesions. No focal  soft tissue abnormality. IMPRESSION: 1. No acute findings. 2. Stable scattered pulmonary nodules bilaterally, largest 2 mm in the right upper lobe. No imaging follow-up is indicated by size criteria. 3. Stable right upper pole renal cyst with septation and calcifications, and stable subcentimeter hemorrhagic cysts in the lower pole of the right kidney. No imaging follow-up is indicated. 4. Left ovarian simple-appearing cyst measuring 4.1 cm. Recommend pelvic ultrasound in 36 months for follow-up. Electronically signed by: Elsie Gravely MD 01/23/2024 06:01 PM EST RP Workstation: [id number]   CT Head Wo Contrast Result Date: 01/23/2024 EXAM: CT HEAD WITHOUT CONTRAST 01/23/2024 04:17:52 PM TECHNIQUE: CT of the head was performed without the administration of intravenous contrast. Automated exposure control, iterative reconstruction, and/or weight based adjustment of the mA/kV was utilized to reduce the radiation dose to as low as reasonably achievable. COMPARISON: Head CT 06/12/2010. CLINICAL HISTORY: 63 year old female with mental status change, unknown cause. FINDINGS: BRAIN AND VENTRICLES: No acute hemorrhage. No hyperdense intracranial hemorrhage is identified. No evidence of acute infarct. No acute cortically based infarct is identified. No hydrocephalus. No ventriculomegaly. Small bilateral low density extra-axial collections over the superior convexities, slightly greater on the right (coronal image 28), measuring up to 3 mm on that side. These are new. No mass effect or midline shift. No acute intracranial mass effect. Chronic right superior convexity craniotomy is stable. Metal embolization coil pack in the posterior fossa with superimposed evidence of distal right vertebral artery stent has been revised since 2012. Superimposed advanced calcified atherosclerosis at the skull base. Abnormal large oval and mildly lobulated rim calcified mass at the central skull base asymmetric to the left (42 x 23 x 22  mm (AP x transverse x CC)). This lesion is inseparable from the ICA siphons and is suspicious for a giant aneurysm. Anterior communicating artery coil pack appears stable. Right anterior superior frontal gyrus encephalomalacia, smaller area of encephalomalacia along the posterior right operculum are stable. ORBITS: No acute abnormality. SINUSES: Visible paranasal sinuses, middle ears and mastoids remain well aerated. SOFT TISSUES AND SKULL: No acute soft tissue abnormality. No skull fracture. Chronic right superior convexity craniotomy is stable. IMPRESSION: 1. Small bilateral low-density Subdural Collections over the superior convexities, up to 3 mm, new from 2012 but with no significant intracranial mass effect. Favor either chronic subdural hematomas or CSF hygromas. 2. Sequelae of right posterior fossa and anterior communicating artery aneurysm embolizations. 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 3. Stable right hemisphere encephalomalacia, previous craniotomy. Electronically signed by: Helayne Hurst MD 01/23/2024 04:40 PM EST RP Workstation: [id number]   DG Chest Portable 1 View Result Date: 01/23/2024 CLINICAL DATA:  Several day history of weakness and fatigue EXAM: PORTABLE CHEST 1 VIEW COMPARISON:  Chest radiograph dated 07/03/2013 FINDINGS: Patient is  rotated slightly to the right. Normal lung volumes. No focal consolidations. No pleural effusion or pneumothorax. Similar postsurgical cardiomediastinal silhouette. Median sternotomy wires are nondisplaced. IMPRESSION: No acute disease. Electronically Signed   By: Limin  Xu M.D.   On: 01/23/2024 16:11    Microbiology: Results for orders placed or performed in visit on 07/17/22  MICROSCOPIC MESSAGE     Status: None   Collection Time: 07/17/22  3:04 PM  Result Value Ref Range Status   Note   Final    Comment: This urine was analyzed for the presence of WBC,  RBC, bacteria, casts, and  other formed elements.  Only those elements seen were reported. . .     Labs: CBC: Recent Labs  Lab 01/23/24 1609 01/24/24 0648 01/25/24 0329 01/26/24 0216 01/27/24 0305  WBC 7.5 6.4 6.2 6.6 6.3  NEUTROABS 5.1  --   --   --   --   HGB 11.1* 9.6* 9.0* 8.4* 8.8*  HCT 33.4* 28.3* 27.3* 25.4* 27.0*  MCV 92.3 91.9 92.5 93.4 94.4  PLT 180 179 167 146* 145*   Basic Metabolic Panel: Recent Labs  Lab 01/23/24 1609 01/24/24 0648 01/25/24 0329 01/26/24 0216 01/27/24 0305  NA 134* 137 139 136 135  K 3.9 3.0* 3.2* 3.8 3.7  CL 95* 98 104 105 104  CO2 17* 23 23 21* 23  GLUCOSE 74 78 88 90 85  BUN 61* 57* 50* 39* 36*  CREATININE 4.07* 3.55* 3.18* 2.76* 2.44*  CALCIUM  9.4 7.9* 7.8* 7.9* 8.5*  MG 2.9*  --   --   --   --   PHOS  --  5.5*  --   --   --    Liver Function Tests: Recent Labs  Lab 01/23/24 1609 01/24/24 0648 01/25/24 0329 01/26/24 0216 01/27/24 0305  AST 270* 190* 186* 153* 130*  ALT 141* 94* 95* 89* 86*  ALKPHOS 50 33* 49 38 35*  BILITOT 0.3 0.5 0.4 0.4 0.4  PROT 7.0 5.5* 5.5* 5.0* 5.1*  ALBUMIN 4.2 2.8* 2.8* 2.4* 2.4*   CBG: Recent Labs  Lab 01/26/24 0730 01/26/24 1124 01/26/24 1601 01/27/24 0908 01/27/24 1132  GLUCAP 95 119* 94 156* 88    Discharge time spent: {LESS THAN/GREATER THAN:26388} 30 minutes.  Signed: Concepcion Riser, MD [facility] 01/27/2024

## 2024-01-27 NOTE — TOC Transition Note (Signed)
 Transition of Care (TOC) - Discharge Note Rayfield Gobble RN, BSN Inpatient Care Management Unit 4E- RN Case Manager See Treatment Team for direct phone #   Patient Details  Name: Olivia Walsh MRN: [id number] Date of Birth: [birth year]  Transition of Care [facility]) CM/SW Contact:  Gobble Rayfield Hurst, RN Phone Number: 01/27/2024, 2:12 PM   Clinical Narrative:    Pt stable for discharge, orders placed for [facility] and DME needs.   CM spoke with pt at bedside. List provided for HH/DME choice. Per pt she is agreeable to [facility] and after review of list has selected [facility] for [facility] provider. No proference for DME provider.   Sister to transport home.  Address, phone # and PCP all confirmed in epic.   Msg sent to [facility] liaison and referral placed in Hub for DME- referral has been accepted and RW to be delivered within the hour to pt at bedside.   Call made to [facility] liaison for [facility] needs and referral placed in Hub- referral has been accepted and they will follow up for start of care visit.   No further IP CM needs noted. IP CM interventions have been completed no further needs noted.   Final next level of care: Home w Home Health Services Barriers to Discharge: No Barriers Identified   Patient Goals and CMS Choice Patient states their goals for this hospitalization and ongoing recovery are:: return home CMS Medicare.gov Compare Post Acute Care list provided to:: Patient Choice offered to / list presented to : Patient      Discharge Placement               Home w/ [facility]        Discharge Plan and Services Additional resources added to the After Visit Summary for     Discharge Planning Services: CM Consult Post Acute Care Choice: Durable Medical Equipment, Home Health          DME Arranged: Vannie rolling DME Agency: [facility] Date DME Agency Contacted: 01/27/24 Time DME Agency Contacted: 1400 Representative spoke with at DME Agency: [facility] HH Arranged: PT, OT HH  Agency: [facility] Date HH Agency Contacted: 01/27/24 Time HH Agency Contacted: 1411 Representative spoke with at [facility] Agency: [facility]  Social Drivers of Health (SDOH) Interventions SDOH Screenings   Food Insecurity: No Food Insecurity (01/23/2024)  Housing: Low Risk  (01/23/2024)  Transportation Needs: No Transportation Needs (01/23/2024)  Utilities: Not At Risk (01/23/2024)  Alcohol Screen: Low Risk  (12/24/2023)  Depression (PHQ2-9): Low Risk  (12/24/2023)  Financial Resource Strain: Low Risk  (01/05/2024)  Physical Activity: Inactive (01/05/2024)  Social Connections: Socially Isolated (01/23/2024)  Stress: No Stress Concern Present (01/05/2024)  Tobacco Use: High Risk (01/23/2024)  Health Literacy: Adequate Health Literacy (12/24/2023)     Readmission Risk Interventions    01/27/2024    2:12 PM  Readmission Risk Prevention Plan  Transportation Screening Complete  Home Care Screening Complete  Medication Review (RN CM) Complete

## 2024-01-28 ENCOUNTER — Telehealth (HOSPITAL_BASED_OUTPATIENT_CLINIC_OR_DEPARTMENT_OTHER): Payer: Self-pay | Admitting: *Deleted

## 2024-01-28 ENCOUNTER — Ambulatory Visit: Attending: Physician Assistant | Admitting: Physician Assistant

## 2024-01-28 DIAGNOSIS — E782 Mixed hyperlipidemia: Secondary | ICD-10-CM

## 2024-01-28 DIAGNOSIS — F17200 Nicotine dependence, unspecified, uncomplicated: Secondary | ICD-10-CM

## 2024-01-28 DIAGNOSIS — E785 Hyperlipidemia, unspecified: Secondary | ICD-10-CM

## 2024-01-28 DIAGNOSIS — I421 Obstructive hypertrophic cardiomyopathy: Secondary | ICD-10-CM

## 2024-01-28 DIAGNOSIS — R7989 Other specified abnormal findings of blood chemistry: Secondary | ICD-10-CM

## 2024-01-28 DIAGNOSIS — I2510 Atherosclerotic heart disease of native coronary artery without angina pectoris: Secondary | ICD-10-CM

## 2024-01-28 NOTE — Telephone Encounter (Signed)
 Copied from CRM #[id number]. Topic: Clinical - Home Health Verbal Orders >> Jan 28, 2024  2:03 PM Mia F wrote: Caller/Agency: Jerel from [facility] Callback Number: [phone] Service Requested: Physical Therapy Frequency: 1x week for 9 weeks  Any new concerns about the patient? No

## 2024-01-30 ENCOUNTER — Encounter

## 2024-01-31 ENCOUNTER — Other Ambulatory Visit

## 2024-01-31 ENCOUNTER — Ambulatory Visit: Payer: Self-pay

## 2024-01-31 NOTE — Telephone Encounter (Signed)
  FYI Only or Action Required?: FYI only for provider: appointment scheduled on 12/9.  Patient was last seen in primary care on 06/06/2023 by de Cuba, Quintin PARAS, MD.  Called Nurse Triage reporting Joint Swelling.  Symptoms began several days ago.  Interventions attempted: Rest, hydration, or home remedies.  Symptoms are: unchanged.  Triage Disposition: See PCP When Office is Open (Within 3 Days)  Patient/caregiver understands and will follow disposition?: Yes         Copied from CRM #[id number]. Topic: Clinical - Red Word Triage >> Jan 31, 2024  8:47 AM Olivia Walsh wrote: Red Word that prompted transfer to Nurse Triage: Ankles swelling since being home from hospital. Reason for Disposition  MODERATE ankle swelling (e.g., interferes with normal activities, can't move joint normally) (Exceptions: Itchy, localized swelling; swelling is chronic.)  Answer Assessment - Initial Assessment Questions 1. LOCATION: Which ankle is swollen? Where is the swelling?     BIL ankles    2. ONSET: When did the swelling start?     11/27 after hospitalization   3. SWELLING: How bad is the swelling? Or, How large is it? (e.g., mild, moderate, severe; size of localized swelling)      Moderate   4. PAIN: Is there any pain? If Yes, ask: How bad is it? (Scale 0-10; or none, mild, moderate, severe)     No pain, just uncomfortable   5. CAUSE: What do you think caused the ankle swelling?       6. OTHER SYMPTOMS: Do you have any other symptoms? (e.g., fever, chest pain, difficulty breathing, calf pain)  No   Patient was D/C'd from the hospital on 11/27 for AKI. She is taking 40mg  of Lasix  currently but now reports BIL ankle swelling. She suspects the swelling is probably due to the increased fluids that received in the hospital but is unsure. Patient denies any pain or redness, and described the severity of the swelling as moderate. She has an upcomming appt. On 12/9 in which she plans  to attend. Pt. Was advised to keep that appt. As the provider will assess the symptoms at that time. Patient agrees with plan of care, and will call back if anything changes, or if symptoms worsen.  Protocols used: Ankle Swelling-A-AH

## 2024-02-04 ENCOUNTER — Ambulatory Visit (INDEPENDENT_AMBULATORY_CARE_PROVIDER_SITE_OTHER): Admitting: Family Medicine

## 2024-02-04 VITALS — BP 136/78 | HR 58 | Temp 97.8°F | Resp 18 | Ht 67.0 in | Wt 125.0 lb

## 2024-02-04 DIAGNOSIS — M7989 Other specified soft tissue disorders: Secondary | ICD-10-CM | POA: Insufficient documentation

## 2024-02-04 DIAGNOSIS — R634 Abnormal weight loss: Secondary | ICD-10-CM | POA: Insufficient documentation

## 2024-02-04 DIAGNOSIS — I10 Essential (primary) hypertension: Secondary | ICD-10-CM

## 2024-02-04 DIAGNOSIS — N184 Chronic kidney disease, stage 4 (severe): Secondary | ICD-10-CM

## 2024-02-04 DIAGNOSIS — Z09 Encounter for follow-up examination after completed treatment for conditions other than malignant neoplasm: Secondary | ICD-10-CM

## 2024-02-04 DIAGNOSIS — E039 Hypothyroidism, unspecified: Secondary | ICD-10-CM

## 2024-02-04 NOTE — Assessment & Plan Note (Signed)
 Persistent edema, particularly in the ankles, likely exacerbated by recent hospitalization and fluid administration. Current diuretic therapy with Lasix  20 mg daily may need adjustment. - Increase Lasix  to 40 mg daily for 3 days, then return to 20 mg daily. - Monitor dietary sodium intake to prevent fluid retention.

## 2024-02-04 NOTE — Assessment & Plan Note (Signed)
 We will plan to monitor TSH all future labs

## 2024-02-04 NOTE — Progress Notes (Signed)
 Procedures performed today:    None.  Independent interpretation of notes and tests performed by another provider:   None.  Brief History, Exam, Impression, and Recommendations:    BP 136/78 (BP Location: Left Arm, Patient Position: Sitting, Cuff Size: Normal)   Pulse (!) 58   Temp 97.8 F (36.6 C) (Oral)   Resp 18   Ht 5' 7 (1.702 m)   Wt 125 lb (56.7 kg)   SpO2 100%   BMI 19.58 kg/m   Patient is accompanied to office today by her sister.  Discussed the use of AI scribe software for clinical note transcription with the patient, who gave verbal consent to proceed.  History of Present Illness Olivia Walsh is a 63 year old female who presents with recent hospitalization for weight loss and lab abnormalities.  Approximately two weeks ago, she was hospitalized due to significant weight loss and lab abnormalities. She describes the lab results as 'numbers that should be high were low and numbers that should be low were high.' Since discharge, she has been feeling better and is undergoing physical therapy once a week at home. She has started drinking Ensure and is being more mindful of her diet, including eating bananas and drinking more wine.  Over the past two years, she has experienced significant weight loss, losing approximately 75 to 80 pounds. Her husband passed away in Nov 28, 2023 after a two-year illness.  She is currently experiencing significant ankle swelling, which began after her recent hospitalization where she received eleven bags of fluid. She is on a 20 mg dose of Lasix  but reports that the swelling remains consistent throughout the day, even with her legs elevated. She describes the sensation as feeling like 'there's pressure inside.' No pain is associated with the swelling.  She has a history of foot drop in her left foot and typically wears a brace, but has had to remove it due to the swelling. She has not experienced any pain associated with the  swelling.  She is under the care of a cardiologist and was previously on Mavacamten  until it was stopped in April or May. She has not followed up with cardiology since then as she typically performs scans every few months when she is on medication. She is also supposed to follow up with a nephrologist but has not seen her in nearly a year and is unsure of the current status of her nephrology care.  Physical therapy began at home the day after her hospital discharge, focusing on basic exercises such as leg lifts, side stepping, and knee raises. She has noticed some improvement in strength, allowing her to get up from her recliner and use a raised toilet seat. Her sister has been assisting her at home since her discharge.  On exam, patient is in no acute distress, vital signs stable.  BMI is at lower end of normal range.  Cardiovascular exam with regular rate and rhythm, lungs clear to auscultation bilaterally.  She does have 1-2+ pitting edema bilaterally.  Essential hypertension Assessment & Plan: Blood pressure initially elevated here in office, did improve on recheck.  Can continue with current regimen, no changes made today.  Recommend reaching out to cardiology office to schedule follow-up visit.  Orders: -     CBC with Differential/Platelet  CKD (chronic kidney disease) stage 4, GFR 15-29 ml/min (HCC) Assessment & Plan: Recent hospitalization with lab abnormalities. Follow-up with nephrology is necessary to monitor kidney function and adjust treatment as needed. - Ordered  labs to check for significant changes since hospital discharge. - Follow up with nephrology for ongoing management of CKD.  Orders: -     Basic metabolic panel with GFR  Hypothyroidism, unspecified type Assessment & Plan: We will plan to monitor TSH all future labs   Weight loss Assessment & Plan: Significant weight loss and muscle wasting, primarily related to poor p.o. intake while husband was ill. Physical  therapy has been initiated to improve strength and mobility. - Continue physical therapy sessions once a week. - Encouraged nutritional intake with Ensure and other high-calorie foods.   Hospital discharge follow-up  Leg swelling Assessment & Plan: Persistent edema, particularly in the ankles, likely exacerbated by recent hospitalization and fluid administration. Current diuretic therapy with Lasix  20 mg daily may need adjustment. - Increase Lasix  to 40 mg daily for 3 days, then return to 20 mg daily. - Monitor dietary sodium intake to prevent fluid retention.   Return in about 6 weeks (around 03/17/2024).  Spent 36 minutes on this patient encounter, including preparation, chart review, face-to-face counseling with patient and coordination of care, and documentation of encounter   ___________________________________________ Severn Goddard de Cuba, MD, ABFM, [facility] Primary Care and Sports Medicine [facility]

## 2024-02-04 NOTE — Assessment & Plan Note (Signed)
 Blood pressure initially elevated here in office, did improve on recheck.  Can continue with current regimen, no changes made today.  Recommend reaching out to cardiology office to schedule follow-up visit.

## 2024-02-04 NOTE — Assessment & Plan Note (Signed)
 Recent hospitalization with lab abnormalities. Follow-up with nephrology is necessary to monitor kidney function and adjust treatment as needed. - Ordered labs to check for significant changes since hospital discharge. - Follow up with nephrology for ongoing management of CKD.

## 2024-02-04 NOTE — Assessment & Plan Note (Signed)
 Significant weight loss and muscle wasting, primarily related to poor p.o. intake while husband was ill. Physical therapy has been initiated to improve strength and mobility. - Continue physical therapy sessions once a week. - Encouraged nutritional intake with Ensure and other high-calorie foods.

## 2024-02-05 ENCOUNTER — Telehealth (HOSPITAL_BASED_OUTPATIENT_CLINIC_OR_DEPARTMENT_OTHER): Payer: Self-pay | Admitting: Family Medicine

## 2024-02-05 ENCOUNTER — Ambulatory Visit (HOSPITAL_BASED_OUTPATIENT_CLINIC_OR_DEPARTMENT_OTHER): Payer: Self-pay | Admitting: Family Medicine

## 2024-02-05 DIAGNOSIS — N184 Chronic kidney disease, stage 4 (severe): Secondary | ICD-10-CM

## 2024-02-05 LAB — CBC WITH DIFFERENTIAL/PLATELET
Basophils Absolute: 0.1 x10E3/uL (ref 0.0–0.2)
Basos: 1 %
EOS (ABSOLUTE): 0.2 x10E3/uL (ref 0.0–0.4)
Eos: 3 %
Hematocrit: 29 % — ABNORMAL LOW (ref 34.0–46.6)
Hemoglobin: 9.4 g/dL — ABNORMAL LOW (ref 11.1–15.9)
Immature Grans (Abs): 0 x10E3/uL (ref 0.0–0.1)
Immature Granulocytes: 0 %
Lymphocytes Absolute: 1.6 x10E3/uL (ref 0.7–3.1)
Lymphs: 22 %
MCH: 32.1 pg (ref 26.6–33.0)
MCHC: 32.4 g/dL (ref 31.5–35.7)
MCV: 99 fL — ABNORMAL HIGH (ref 79–97)
Monocytes Absolute: 0.8 x10E3/uL (ref 0.1–0.9)
Monocytes: 11 %
Neutrophils Absolute: 4.7 x10E3/uL (ref 1.4–7.0)
Neutrophils: 63 %
Platelets: 278 x10E3/uL (ref 150–450)
RBC: 2.93 x10E6/uL — ABNORMAL LOW (ref 3.77–5.28)
RDW: 15 % (ref 11.7–15.4)
WBC: 7.3 x10E3/uL (ref 3.4–10.8)

## 2024-02-05 LAB — BASIC METABOLIC PANEL WITH GFR
BUN/Creatinine Ratio: 18 (ref 12–28)
BUN: 38 mg/dL — ABNORMAL HIGH (ref 8–27)
CO2: 24 mmol/L (ref 20–29)
Calcium: 9 mg/dL (ref 8.7–10.3)
Chloride: 97 mmol/L (ref 96–106)
Creatinine, Ser: 2.06 mg/dL — ABNORMAL HIGH (ref 0.57–1.00)
Glucose: 83 mg/dL (ref 70–99)
Potassium: 4.8 mmol/L (ref 3.5–5.2)
Sodium: 139 mmol/L (ref 134–144)
eGFR: 26 mL/min/1.73 — ABNORMAL LOW (ref >59)

## 2024-02-05 NOTE — Telephone Encounter (Signed)
 Copied from CRM #[id number]. Topic: General - Other >> Feb 05, 2024  2:48 PM Tysheama G wrote: Reason for CRM: Patient returning Tara's call regarding her referral.

## 2024-02-05 NOTE — Telephone Encounter (Signed)
Spoke with patient. All concerns were addressed

## 2024-02-05 NOTE — Telephone Encounter (Signed)
 Copied from CRM #[id number]. Topic: Referral - Request for Referral >> Feb 05, 2024  8:59 AM Aleatha BROCKS wrote: Did the patient discuss referral with their provider in the last year? Yes (If No - schedule appointment) (If Yes - send message)  Appointment offered? No  Type of order/referral and detailed reason for visit: Kidney Specialist  Preference of office, provider, location: [facility], on [address] [place], [place]  If referral order, have you been seen by this specialty before? Yes at office, but old doctor retired (If Yes, this issue or another issue? When? Where?  Can we respond through MyChart? No

## 2024-02-05 NOTE — Telephone Encounter (Signed)
 Spoke with patient and informed her to follow up with primary cardiologist. Patient states she has not seen [facility] since 2022. New referral has been sent.

## 2024-02-05 NOTE — Telephone Encounter (Signed)
 Copied from CRM #[id number]. Topic: General - Other >> Feb 05, 2024 12:09 PM Willma R wrote: Reason for CRM: Patient returning phone call from Rexene, tried number she left and was routed to a child psychotherapist. Patient is requesting a callback.  Patient can be reached at [phone]

## 2024-02-05 NOTE — Telephone Encounter (Signed)
 Left message requesting return call regarding referral concern.

## 2024-02-07 ENCOUNTER — Ambulatory Visit: Payer: Self-pay

## 2024-02-07 NOTE — Telephone Encounter (Signed)
 FYI Only or Action Required?: Action required by provider: clinical question for provider.  Patient was last seen in primary care on 02/04/2024 by de Cuba, Quintin PARAS, MD.  Called Nurse Triage reporting Joint Swelling.  Symptoms began today.  Interventions attempted: Prescription medications: Lasix .  Symptoms are: gradually improving.Pt. States her ankle swelling is getting better taking the extra Lasix , can I continue to take it a few more days. Please advise pt.  Triage Disposition: See PCP Within 2 Weeks  Patient/caregiver understands and will follow disposition?: No, wishes to speak with PCPCopied from CRM #[id number]. Topic: Clinical - Red Word Triage >> Feb 07, 2024  2:16 PM Ivette P wrote: [facility] that prompted transfer to Nurse Triage: ankle is swelling - taking additional furosemide . typically taking 1 a day and now taking an extra one and ankle is still swollen. do continue pill for a couple more days. got a pedal machine and going down some. Reason for Disposition  [1] MILD swelling of both ankles (i.e., pedal edema) AND [2] is a chronic symptom (recurrent or ongoing AND present > 4 weeks)  Answer Assessment - Initial Assessment Questions 1. LOCATION: Which ankle is swollen? Where is the swelling?     Both ankles, left is worse 2. ONSET: When did the swelling start?     Several weeks 3. SWELLING: How bad is the swelling? Or, How large is it? (e.g., mild, moderate, severe; size of localized swelling)      Ankles 4. PAIN: Is there any pain? If Yes, ask: How bad is it? (Scale 0-10; or none, mild, moderate, severe)     none 5. CAUSE: What do you think caused the ankle swelling?     Extra fluid 6. OTHER SYMPTOMS: Do you have any other symptoms? (e.g., fever, chest pain, difficulty breathing, calf pain)     no 7. PREGNANCY: Is there any chance you are pregnant? When was your last menstrual period?     no  Protocols used: Ankle Swelling-A-AH

## 2024-02-08 ENCOUNTER — Encounter (HOSPITAL_BASED_OUTPATIENT_CLINIC_OR_DEPARTMENT_OTHER): Payer: Self-pay | Admitting: Family Medicine

## 2024-02-10 NOTE — Telephone Encounter (Signed)
 Patient was provided with provider's response. Patient verbalized understanding. All questions and concerns have been addressed.

## 2024-02-10 NOTE — Telephone Encounter (Signed)
 Pt sent another mychart message checking for update on message that she sent 12/13. Routing back to  Dr. De Cuba for review.

## 2024-02-11 ENCOUNTER — Other Ambulatory Visit (HOSPITAL_BASED_OUTPATIENT_CLINIC_OR_DEPARTMENT_OTHER): Payer: Self-pay | Admitting: *Deleted

## 2024-02-11 DIAGNOSIS — E039 Hypothyroidism, unspecified: Secondary | ICD-10-CM

## 2024-02-11 MED ORDER — LEVOTHYROXINE SODIUM 50 MCG PO TABS: 50.0000 ug | ORAL_TABLET | Freq: Every day | ORAL | 1 refills | Status: AC

## 2024-02-13 NOTE — Telephone Encounter (Signed)
 Spoke with Cardiology who states that they have a last minute cancellation for 03/03/24. I instructed patient to contact Cardiology to schedule for that date. Patient verbalized understanding. All questions and concerns have been addressed.

## 2024-02-18 ENCOUNTER — Ambulatory Visit (HOSPITAL_BASED_OUTPATIENT_CLINIC_OR_DEPARTMENT_OTHER): Admitting: Family Medicine

## 2024-03-06 ENCOUNTER — Ambulatory Visit

## 2024-03-06 ENCOUNTER — Ambulatory Visit: Attending: Internal Medicine | Admitting: Internal Medicine

## 2024-03-06 VITALS — BP 148/80 | HR 52 | Ht 67.0 in | Wt 116.0 lb

## 2024-03-06 DIAGNOSIS — I493 Ventricular premature depolarization: Secondary | ICD-10-CM

## 2024-03-06 DIAGNOSIS — I421 Obstructive hypertrophic cardiomyopathy: Secondary | ICD-10-CM

## 2024-03-06 LAB — LDL CHOLESTEROL, DIRECT: LDL Direct: 72 mg/dL (ref 0–99)

## 2024-03-06 NOTE — Progress Notes (Unsigned)
 Enrolled patient for a 7 day Zio XT monitor to be mailed to patients home.

## 2024-03-06 NOTE — Progress Notes (Signed)
 " Cardiology Office Note:  .    Date:  03/06/2024  ID:  Olivia Walsh, DOB May 20, 1960, MRN [id number] PCP: de [place], Raymond J, MD  [facility] Providers Cardiologist:  Lynwood Schilling, MD     CC: HCM f/u  History of Present Illness: .    Olivia Walsh is a 64 y.o. female with hypertrophic obstructive cardiomyopathy and bradycardia who presents for follow-up. She is accompanied by her sister.  She has a history of hypertrophic obstructive cardiomyopathy and bradycardia. Previously, she was on Mavacamten  but discontinued it due to transaminitis and lack of significant benefit. She has not been on CMI therapy since then. Currently, she feels better than before, although not back to her normal state, and is actively working on improving her physical condition through physical therapy and exercise.  She has a history of coronary artery disease and underwent coronary artery bypass grafting (CABG). The patient does not report current chest pain or shortness of breath. Her cholesterol levels were last checked in 04/02/22.  She has experienced significant weight loss, approximately 70-80 pounds, due to stress and lack of appetite during her husband's illness and subsequent passing. She is currently working on regaining her strength and weight by consuming Ensure and eating more regularly.  She has a history of tobacco use but did not indicate any recent changes in her smoking habits. Her sister is present during the visit, and there is a discussion about family history of hypertrophic cardiomyopathy.  Discussed the use of AI scribe software for clinical note transcription with the patient, who gave verbal consent to proceed.   Relevant histories: .  Social  04-02-2020: moderate MR, septal thickness 16 mm, no LGE), peak gradient 102 mm Hg 04-02-21: She had light-headedness, fatigue and did not feel well.  Deferred CMIs then started them with worsening LFTS relates to good powder. Apr 02, 2022: on 2.5  mg Mavacamten  felt better.  Did not do her exercise prescription attempt. Husband has an ablation and afterwards she is going to the gym. Then had cancer.  She elected to stop CMI therapy 04/03/2023: Husband passed away.  ROS: As per HPI.   Studies Reviewed: .     Cardiac Studies & Procedures   ______________________________________________________________________________________________     ECHOCARDIOGRAM  ECHOCARDIOGRAM COMPLETE 01/24/2024  Narrative ECHOCARDIOGRAM REPORT    Patient Name:   Olivia Walsh Date of Exam: 01/24/2024 Medical Rec #:  [id number]           Height:       67.0 in Accession #:    [id number]          Weight:       117.1 lb Date of Birth:  10/14/1960           BSA:          1.611 m Patient Age:    63 years            BP:           105/57 mmHg Patient Gender: F                   HR:           52 bpm. Exam Location:  Inpatient  Procedure: 2D Echo, Color Doppler and Cardiac Doppler (Both Spectral and Color Flow Doppler were utilized during procedure).  Indications:     Elevated Troponin  History:         Patient has prior history of Echocardiogram examinations, most  recent 07/30/2023. CAD; Risk Factors:Dyslipidemia.  Sonographer:     Sherlean Dubin Referring Phys:  EVALENE RAMAN OPYD Diagnosing Phys: Georganna Archer   Sonographer Comments: Image acquisition challenging due to respiratory motion. IMPRESSIONS   1. There is evidence of dynamic LVOT obstruction. At rest, the mean LV gradient is mildly elevated at 31 mmHg. With valvalsa, the mean LV gradient becomes significantly elevated to 63 mmHg. 2. Left ventricular ejection fraction, by estimation, is 65 to 70%. The left ventricle has normal function. The left ventricle has no regional wall motion abnormalities. There is moderate asymmetric left ventricular hypertrophy of the basal-septal segment. Left ventricular diastolic parameters are indeterminate. 3. Right ventricular systolic function is normal.  The right ventricular size is normal. Tricuspid regurgitation signal is inadequate for assessing PA pressure. 4. The mitral valve is normal in appearance. There is evidence of systolic annular motion of the mitral valve with septal contact. There is mild-moderate, posteriorly directed, mitral regurgitation. No mitral stenosis. 5. The aortic valve is tricuspid. Aortic valve regurgitation is not visualized. No aortic stenosis is present. 6. The inferior vena cava is normal in size with greater than 50% respiratory variability, suggesting right atrial pressure of 3 mmHg.  Comparison(s): Changes from prior study are noted. Mild gradient through LVOT now present.  Conclusion(s)/Recommendation(s): Significantly elevated LVOT gradient with valsalva and thin/collapsible IVC suggest dehydration. Consider IV fluid administration if clinically appropriate.  FINDINGS Left Ventricle: Left ventricular ejection fraction, by estimation, is 65 to 70%. The left ventricle has normal function. The left ventricle has no regional wall motion abnormalities. The left ventricular internal cavity size was small. There is moderate asymmetric left ventricular hypertrophy of the basal-septal segment. Left ventricular diastolic parameters are indeterminate.  Right Ventricle: The right ventricular size is normal. No increase in right ventricular wall thickness. Right ventricular systolic function is normal. Tricuspid regurgitation signal is inadequate for assessing PA pressure.  Left Atrium: Left atrial size was normal in size.  Right Atrium: Right atrial size was normal in size.  Pericardium: There is no evidence of pericardial effusion.  Mitral Valve: The mitral valve is normal in appearance. There is evidence of systolic annular motion of the mitral valve with septal contact. There is mild-moderate, posteriorly directed, mitral regurgitation. No mitral stenosis.  Tricuspid Valve: The tricuspid valve is normal in  structure. Tricuspid valve regurgitation is trivial. No evidence of tricuspid stenosis.  Aortic Valve: The aortic valve is tricuspid. Aortic valve regurgitation is not visualized. No aortic stenosis is present. Aortic valve mean gradient measures 19.0 mmHg. Aortic valve peak gradient measures 26.5 mmHg. Aortic valve area, by VTI measures 2.13 cm.  Pulmonic Valve: The pulmonic valve was not well visualized. Pulmonic valve regurgitation is not visualized. No evidence of pulmonic stenosis.  Aorta: The aortic root and ascending aorta are structurally normal, with no evidence of dilitation.  Venous: The inferior vena cava is normal in size with greater than 50% respiratory variability, suggesting right atrial pressure of 3 mmHg.  IAS/Shunts: No atrial level shunt detected by color flow Doppler.   LEFT VENTRICLE PLAX 2D LVIDd:         3.10 cm   Diastology LVIDs:         2.30 cm   LV e' medial:    8.16 cm/s LV PW:         1.60 cm   LV E/e' medial:  11.1 LV IVS:        1.70 cm   LV e' lateral:   7.40  cm/s LVOT diam:     2.20 cm   LV E/e' lateral: 12.3 LV SV:         158 LV SV Index:   98 LVOT Area:     3.80 cm   RIGHT VENTRICLE          IVC RV Basal diam:  2.90 cm  IVC diam: 1.40 cm RV Mid diam:    1.90 cm TAPSE (M-mode): 1.6 cm  LEFT ATRIUM             Index        RIGHT ATRIUM           Index LA diam:        3.50 cm 2.17 cm/m   RA Area:     12.70 cm LA Vol (A2C):   28.8 ml 17.88 ml/m  RA Volume:   28.80 ml  17.88 ml/m LA Vol (A4C):   28.5 ml 17.69 ml/m LA Biplane Vol: 29.8 ml 18.50 ml/m AORTIC VALVE AV Area (Vmax):    2.77 cm AV Area (Vmean):   2.26 cm AV Area (VTI):     2.13 cm AV Vmax:           257.50 cm/s AV Vmean:          201.000 cm/s AV VTI:            0.743 m AV Peak Grad:      26.5 mmHg AV Mean Grad:      19.0 mmHg LVOT Vmax:         187.50 cm/s LVOT Vmean:        119.500 cm/s LVOT VTI:          0.416 m LVOT/AV VTI ratio: 0.56  AORTA Ao Root diam: 3.30  cm Ao Asc diam:  3.20 cm  MITRAL VALVE MV Area (PHT): 7.74 cm    SHUNTS MV Decel Time: 98 msec     Systemic VTI:  0.42 m MV E velocity: 90.80 cm/s  Systemic Diam: 2.20 cm  Georganna Archer Electronically signed by Georganna Archer Signature Date/Time: 01/24/2024/4:10:51 PM    Final    MONITORS  LONG TERM MONITOR (3-14 DAYS) 10/03/2021  Narrative   Patient had a minimum heart rate of 41 bpm, maximum heart rate of 177 bpm, and average heart rate of 51 bpm.   Predominant underlying rhythm was sinus rhythm.   Isolated PACs were rare (<1.0%).   Isolated PVCs were occasional (1.9%).   No triggered and diary events.  HCM with out evidence of NSVT.     CARDIAC MRI  MR CARDIAC MORPHOLOGY W WO CONTRAST 06/14/2021  Narrative CLINICAL DATA:  Hypertrophic Cardiomyopathy  EXAM: CARDIAC MRI  TECHNIQUE: The patient was scanned on a 1.5 Tesla Siemens magnet. A dedicated cardiac coil was used. Functional imaging was done using Fiesta sequences. 2,3, and 4 chamber views were done to assess for RWMA's. Modified Simpson's rule using a short axis stack was used to calculate an ejection fraction on a dedicated work [employer]. The patient received 8 cc of Gadavist . After 10 minutes inversion recovery sequences were used to assess for infiltration and scar tissue.  CONTRAST:  Gadavist   FINDINGS: Moderate LAE. Normal RA/RV. No ASD/PFO. Normal ascending thoracic aorta 3.4 cm Severe LV hypertrophy asymmetric with septal thickness 16 mm and lateral wall only 8 mm There is SAM with turbulence in the LVOT. Moderate appearing MR Morphologic appearance consistent with obstructive hypertrophic cardiomyopathy Delayed gadolinium images show no  high risk features with no infiltration, scar or infarct particularly in septum No significant pericardial effusion Quantitative LVEF 59% (EDV 130 cc ESV 54 cc SV 77 cc )  T1 1056 msec  ECV 30%  T2 50 msec  IMPRESSION: 1.  Severe asymmetric septal hypertrophy thickness 16 mm compared to lateral wall 8 mm  2.  Normal LV size and function EF 59% no RWMAls  3. SAM with LVOT turbulence and moderate appearing MR consistent with obstructive hypertrophic cardiomyopathy  4.  Normal parametric measures see above  5. No high risk gadolinium uptake in LV particularly septum no infarct scar/infiltration noted  6.  Moderate LAE  Olivia Walsh   Electronically Signed By: Olivia Walsh M.D. On: 06/14/2021 12:32   ______________________________________________________________________________________________       Physical Exam:    VS:  BP (!) 148/80 (BP Location: Left Arm)   Pulse (!) 52   Ht 5' 7 (1.702 m)   Wt 116 lb (52.6 kg)   SpO2 100%   BMI 18.17 kg/m    Wt Readings from Last 3 Encounters:  03/06/24 116 lb (52.6 kg)  02/04/24 125 lb (56.7 kg)  01/23/24 117 lb 1.6 oz (53.1 kg)    Gen: no distress  HEENT: No JVD, poor dentition Cardiac: No Rubs or Gallops, harsh systolic murmur, regular bradycardia +2 radial pulses Respiratory: Clear to auscultation bilaterally, normal effort, normal  respiratory rate GI: Soft, nontender, non-distended  MS: No  edema;  moves all extremities Integument: Skin feels warm Neuro:  At time of evaluation, alert and oriented to person/place/time/situation  Psych: Normal affect, patient feels ok    ASSESSMENT AND PLAN: .    Hypertrophic Cardiomyopathy - Sigmoid septal Variant - peak gradient 65 mm Hg on AV nodal therapy  - suspicion of Fabry's/Danon/Noonan's or other mimics of HCM: low - Gene variant: Deferred - NYHA II  - Non HCM Contributors to disease/status Coronary artery disease, status post coronary artery bypass grafting No current chest pain. Previous cholesterol levels were elevated. Importance of aggressive prevention due to young age and history of bypass surgery discussed. - Ordered LDL cholesterol test - Will reassess cholesterol management  based on test results  Hypertension Blood pressure has been labile, likely due to recent deconditioning and nutritional status. Current management deferred until nutritional status improves. - Monitor blood pressure - Will reassess blood pressure management if persistently elevated  Tobacco use disorder Continues to smoke. Open door policy for smoking cessation support if she decides to quit. - Offer smoking cessation support if she decides to quit  Severe deconditioning Protein calorie Malnutrition Significant weight loss and weakness. Engaged in physical therapy and nutritional support. Goal is to regain strength and improve conditioning. - Continue physical therapy - Encouraged nutritional support with Ensure and regular meals  Family history reviewed, Discussed family screening  - sister saw her cardiologist today; no HCM  SCD  Assessment - SCD risk score 1.31% based on prior data - re-assessing SCD risk with heart monitor  Atrial fibrillation Assessment  - HCM-AF score 21  Medication symptom plan - Severe obstruction with a LVOT gradient of 60 mmHg. Bradycardia limits therapeutic options with AV nodal therapy. No current symptoms of chest pain or dyspnea. Previous CMI therapy was discontinued due to lack of benefit and transaminitis. Consideration of afacantin if symptoms develop. Heart monitor to assess for arrhythmias due to increased risk of AFib. - Ordered heart monitor to assess for arrhythmias - Will reassess therapy if she would like  to re-consider CMI therapy  Three months with Olivia Walsh   Time:   I have spent a total of 60 minutes with the patient reviewing notes, imaging, EKGs, labs,  and examining the patient as well as establishing an assessment and plan that was discussed personally with the patient. Discussed disease state education and family screening    Stanly Leavens, MD FASE [facility] Cardiologist [facility]  [address] [place],  [address] [place], [place] [address] [phone]  5:12 PM  "

## 2024-03-06 NOTE — Patient Instructions (Signed)
 Medication Instructions:  Your physician recommends that you continue on your current medications as directed. Please refer to the Current Medication list given to you today.  *If you need a refill on your cardiac medications before your next appointment, please call your pharmacy*  Lab Work: LDL Direct at [employer]  If you have labs (blood work) drawn today and your tests are completely normal, you will receive your results only by: MyChart Message (if you have MyChart) OR A paper copy in the mail If you have any lab test that is abnormal or we need to change your treatment, we will call you to review the results.  Testing/Procedures: Your physician has requested that you wear a heart monitor.   Follow-Up: At [facility], you and your health needs are our priority.  As part of our continuing mission to provide you with exceptional heart care, our providers are all part of one team.  This team includes your primary Cardiologist (physician) and Advanced Practice Providers or APPs (Physician Assistants and Nurse Practitioners) who all work together to provide you with the care you need, when you need it.  Your next appointment:   6 month(s)  Provider:   Orren Fabry, PA-C       Other Instructions   ZIO XT- Long Term Monitor Instructions  Your physician has requested you wear a ZIO patch monitor for 7 days.  This is a single patch monitor. [employer] supplies one patch monitor per enrollment. Additional stickers are not available. Please do not apply patch if you will be having a Nuclear Stress Test,  Echocardiogram, Cardiac CT, MRI, or Chest Xray during the period you would be wearing the  monitor. The patch cannot be worn during these tests. You cannot remove and re-apply the  ZIO XT patch monitor.  Your ZIO patch monitor will be mailed 3 day USPS to your address on file. It may take 3-5 days  to receive your monitor after you have been enrolled.  Once you have received your  monitor, please review the enclosed instructions. Your monitor  has already been registered assigning a specific monitor serial # to you.  Billing and Patient Assistance Program Information  We have supplied [employer] with any of your insurance information on file for billing purposes. [employer] offers a sliding scale Patient Assistance Program for patients that do not have  insurance, or whose insurance does not completely cover the cost of the ZIO monitor.  You must apply for the Patient Assistance Program to qualify for this discounted rate.  To apply, please call [employer] at [phone], select option 4, select option 2, ask to apply for  Patient Assistance Program. Meredeth will ask your household income, and how many people  are in your household. They will quote your out-of-pocket cost based on that information.  [employer] will also be able to set up a 33-month, interest-free payment plan if needed.  Applying the monitor   Shave hair from upper left chest.  Hold abrader disc by orange tab. Rub abrader in 40 strokes over the upper left chest as  indicated in your monitor instructions.  Clean area with 4 enclosed alcohol pads. Let dry.  Apply patch as indicated in monitor instructions. Patch will be placed under collarbone on left  side of chest with arrow pointing upward.  Rub patch adhesive wings for 2 minutes. Remove white label marked 1. Remove the white  label marked 2. Rub patch adhesive wings for 2 additional minutes.  While looking  in a mirror, press and release button in center of patch. A small green light will  flash 3-4 times. This will be your only indicator that the monitor has been turned on.  Do not shower for the first 24 hours. You may shower after the first 24 hours.  Press the button if you feel a symptom. You will hear a small click. Record Date, Time and  Symptom in the Patient Logbook.  When you are ready to remove the patch, follow instructions on the last 2  pages of Patient  Logbook. Stick patch monitor onto the last page of Patient Logbook.  Place Patient Logbook in the blue and white box. Use locking tab on box and tape box closed  securely. The blue and white box has prepaid postage on it. Please place it in the mailbox as  soon as possible. Your physician should have your test results approximately 7 days after the  monitor has been mailed back to [facility].  Call [facility] Customer Care at [phone] if you have questions regarding  your ZIO XT patch monitor. Call them immediately if you see an orange light blinking on your  monitor.  If your monitor falls off in less than 4 days, contact our Monitor department at [phone].  If your monitor becomes loose or falls off after 4 days call [employer] at [phone] for  suggestions on securing your monitor

## 2024-03-09 ENCOUNTER — Ambulatory Visit: Payer: Self-pay

## 2024-03-12 ENCOUNTER — Other Ambulatory Visit (HOSPITAL_BASED_OUTPATIENT_CLINIC_OR_DEPARTMENT_OTHER): Payer: Self-pay | Admitting: *Deleted

## 2024-03-12 DIAGNOSIS — I10 Essential (primary) hypertension: Secondary | ICD-10-CM

## 2024-03-12 MED ORDER — SPIRONOLACTONE 25 MG PO TABS: 25.0000 mg | ORAL_TABLET | Freq: Every day | ORAL | 3 refills | Status: AC

## 2024-03-20 ENCOUNTER — Ambulatory Visit (HOSPITAL_BASED_OUTPATIENT_CLINIC_OR_DEPARTMENT_OTHER): Admitting: Family Medicine

## 2024-03-24 ENCOUNTER — Ambulatory Visit (HOSPITAL_BASED_OUTPATIENT_CLINIC_OR_DEPARTMENT_OTHER): Admitting: Family Medicine

## 2024-03-29 DIAGNOSIS — I421 Obstructive hypertrophic cardiomyopathy: Secondary | ICD-10-CM | POA: Diagnosis not present

## 2024-03-29 DIAGNOSIS — I493 Ventricular premature depolarization: Secondary | ICD-10-CM

## 2024-04-02 ENCOUNTER — Ambulatory Visit (HOSPITAL_BASED_OUTPATIENT_CLINIC_OR_DEPARTMENT_OTHER): Admitting: Family Medicine

## 2024-04-06 ENCOUNTER — Ambulatory Visit: Admitting: Physician Assistant

## 2024-04-20 ENCOUNTER — Ambulatory Visit (HOSPITAL_BASED_OUTPATIENT_CLINIC_OR_DEPARTMENT_OTHER): Admitting: Family Medicine

## 2025-01-19 ENCOUNTER — Ambulatory Visit (HOSPITAL_BASED_OUTPATIENT_CLINIC_OR_DEPARTMENT_OTHER)
